# Patient Record
Sex: Female | Born: 1935 | Race: White | Hispanic: No | Marital: Married | Smoking: Never smoker
Health system: Southern US, Community
[De-identification: ages and names within clinical notes are randomized; demographics above are authoritative.]

## PROBLEM LIST (undated history)

## (undated) DIAGNOSIS — J449 Chronic obstructive pulmonary disease, unspecified: Secondary | ICD-10-CM

## (undated) DIAGNOSIS — I639 Cerebral infarction, unspecified: Secondary | ICD-10-CM

## (undated) HISTORY — PX: PELVIC FRACTURE SURGERY: SHX119

---

## 2016-04-09 ENCOUNTER — Inpatient Hospital Stay (HOSPITAL_COMMUNITY): Payer: Medicare Other | Admitting: Anesthesiology

## 2016-04-09 ENCOUNTER — Inpatient Hospital Stay (HOSPITAL_COMMUNITY)
Admission: EM | Admit: 2016-04-09 | Discharge: 2016-04-14 | DRG: 024 | Disposition: A | Discharge status: Rehabilitation Facility | Payer: Medicare Other | Attending: Neurology | Admitting: Neurology

## 2016-04-09 ENCOUNTER — Inpatient Hospital Stay (HOSPITAL_COMMUNITY): Payer: Medicare Other

## 2016-04-09 ENCOUNTER — Encounter (HOSPITAL_COMMUNITY): Payer: Self-pay | Admitting: Emergency Medicine

## 2016-04-09 ENCOUNTER — Encounter (HOSPITAL_COMMUNITY)
Admission: EM | Disposition: A | Discharge status: Rehabilitation Facility | Payer: Self-pay | Source: Home / Self Care | Attending: Neurology

## 2016-04-09 ENCOUNTER — Emergency Department (HOSPITAL_COMMUNITY): Payer: Medicare Other

## 2016-04-09 DIAGNOSIS — E871 Hypo-osmolality and hyponatremia: Secondary | ICD-10-CM | POA: Diagnosis present

## 2016-04-09 DIAGNOSIS — Z79899 Other long term (current) drug therapy: Secondary | ICD-10-CM

## 2016-04-09 DIAGNOSIS — R531 Weakness: Secondary | ICD-10-CM | POA: Diagnosis present

## 2016-04-09 DIAGNOSIS — I519 Heart disease, unspecified: Secondary | ICD-10-CM | POA: Diagnosis not present

## 2016-04-09 DIAGNOSIS — T82898A Other specified complication of vascular prosthetic devices, implants and grafts, initial encounter: Secondary | ICD-10-CM

## 2016-04-09 DIAGNOSIS — I10 Essential (primary) hypertension: Secondary | ICD-10-CM | POA: Diagnosis present

## 2016-04-09 DIAGNOSIS — I69319 Unspecified symptoms and signs involving cognitive functions following cerebral infarction: Secondary | ICD-10-CM | POA: Diagnosis not present

## 2016-04-09 DIAGNOSIS — I63311 Cerebral infarction due to thrombosis of right middle cerebral artery: Secondary | ICD-10-CM | POA: Diagnosis not present

## 2016-04-09 DIAGNOSIS — Q251 Coarctation of aorta: Secondary | ICD-10-CM

## 2016-04-09 DIAGNOSIS — I63132 Cerebral infarction due to embolism of left carotid artery: Secondary | ICD-10-CM | POA: Diagnosis not present

## 2016-04-09 DIAGNOSIS — Z7951 Long term (current) use of inhaled steroids: Secondary | ICD-10-CM

## 2016-04-09 DIAGNOSIS — R4189 Other symptoms and signs involving cognitive functions and awareness: Secondary | ICD-10-CM | POA: Diagnosis present

## 2016-04-09 DIAGNOSIS — R4701 Aphasia: Secondary | ICD-10-CM | POA: Diagnosis present

## 2016-04-09 DIAGNOSIS — J690 Pneumonitis due to inhalation of food and vomit: Secondary | ICD-10-CM | POA: Diagnosis not present

## 2016-04-09 DIAGNOSIS — G8191 Hemiplegia, unspecified affecting right dominant side: Secondary | ICD-10-CM | POA: Diagnosis present

## 2016-04-09 DIAGNOSIS — I6522 Occlusion and stenosis of left carotid artery: Secondary | ICD-10-CM | POA: Diagnosis present

## 2016-04-09 DIAGNOSIS — Z9981 Dependence on supplemental oxygen: Secondary | ICD-10-CM

## 2016-04-09 DIAGNOSIS — R1310 Dysphagia, unspecified: Secondary | ICD-10-CM | POA: Diagnosis present

## 2016-04-09 DIAGNOSIS — J449 Chronic obstructive pulmonary disease, unspecified: Secondary | ICD-10-CM | POA: Diagnosis present

## 2016-04-09 DIAGNOSIS — I63413 Cerebral infarction due to embolism of bilateral middle cerebral arteries: Principal | ICD-10-CM | POA: Diagnosis present

## 2016-04-09 DIAGNOSIS — R1312 Dysphagia, oropharyngeal phase: Secondary | ICD-10-CM | POA: Diagnosis not present

## 2016-04-09 DIAGNOSIS — I63312 Cerebral infarction due to thrombosis of left middle cerebral artery: Secondary | ICD-10-CM

## 2016-04-09 DIAGNOSIS — Z01818 Encounter for other preprocedural examination: Secondary | ICD-10-CM

## 2016-04-09 DIAGNOSIS — E785 Hyperlipidemia, unspecified: Secondary | ICD-10-CM | POA: Diagnosis present

## 2016-04-09 DIAGNOSIS — R7303 Prediabetes: Secondary | ICD-10-CM | POA: Diagnosis present

## 2016-04-09 DIAGNOSIS — I6603 Occlusion and stenosis of bilateral middle cerebral arteries: Secondary | ICD-10-CM | POA: Diagnosis not present

## 2016-04-09 DIAGNOSIS — E876 Hypokalemia: Secondary | ICD-10-CM | POA: Diagnosis not present

## 2016-04-09 DIAGNOSIS — R29717 NIHSS score 17: Secondary | ICD-10-CM | POA: Diagnosis present

## 2016-04-09 DIAGNOSIS — H5347 Heteronymous bilateral field defects: Secondary | ICD-10-CM | POA: Diagnosis present

## 2016-04-09 DIAGNOSIS — D62 Acute posthemorrhagic anemia: Secondary | ICD-10-CM | POA: Diagnosis not present

## 2016-04-09 DIAGNOSIS — J410 Simple chronic bronchitis: Secondary | ICD-10-CM | POA: Diagnosis not present

## 2016-04-09 DIAGNOSIS — D72829 Elevated white blood cell count, unspecified: Secondary | ICD-10-CM | POA: Diagnosis present

## 2016-04-09 DIAGNOSIS — I639 Cerebral infarction, unspecified: Secondary | ICD-10-CM | POA: Diagnosis not present

## 2016-04-09 DIAGNOSIS — I5189 Other ill-defined heart diseases: Secondary | ICD-10-CM

## 2016-04-09 DIAGNOSIS — J9690 Respiratory failure, unspecified, unspecified whether with hypoxia or hypercapnia: Secondary | ICD-10-CM

## 2016-04-09 DIAGNOSIS — I69351 Hemiplegia and hemiparesis following cerebral infarction affecting right dominant side: Secondary | ICD-10-CM | POA: Diagnosis present

## 2016-04-09 DIAGNOSIS — Z4659 Encounter for fitting and adjustment of other gastrointestinal appliance and device: Secondary | ICD-10-CM

## 2016-04-09 DIAGNOSIS — I69391 Dysphagia following cerebral infarction: Secondary | ICD-10-CM | POA: Diagnosis not present

## 2016-04-09 HISTORY — DX: Chronic obstructive pulmonary disease, unspecified: J44.9

## 2016-04-09 HISTORY — PX: IR GENERIC HISTORICAL: IMG1180011

## 2016-04-09 HISTORY — PX: RADIOLOGY WITH ANESTHESIA: SHX6223

## 2016-04-09 LAB — I-STAT CHEM 8, ED
BUN: 31 mg/dL — ABNORMAL HIGH (ref 6–20)
CALCIUM ION: 1.03 mmol/L — AB (ref 1.15–1.40)
CHLORIDE: 99 mmol/L — AB (ref 101–111)
Creatinine, Ser: 0.9 mg/dL (ref 0.44–1.00)
Glucose, Bld: 233 mg/dL — ABNORMAL HIGH (ref 65–99)
HEMATOCRIT: 46 % (ref 36.0–46.0)
Hemoglobin: 15.6 g/dL — ABNORMAL HIGH (ref 12.0–15.0)
Potassium: 4.1 mmol/L (ref 3.5–5.1)
SODIUM: 134 mmol/L — AB (ref 135–145)
TCO2: 25 mmol/L (ref 0–100)

## 2016-04-09 LAB — CBG MONITORING, ED: Glucose-Capillary: 224 mg/dL — ABNORMAL HIGH (ref 65–99)

## 2016-04-09 LAB — DIFFERENTIAL
Basophils Absolute: 0 10*3/uL (ref 0.0–0.1)
Basophils Relative: 0 %
Eosinophils Absolute: 0 10*3/uL (ref 0.0–0.7)
Eosinophils Relative: 0 %
Lymphocytes Relative: 6 %
Lymphs Abs: 0.8 10*3/uL (ref 0.7–4.0)
MONO ABS: 0.3 10*3/uL (ref 0.1–1.0)
Monocytes Relative: 3 %
NEUTROS PCT: 91 %
Neutro Abs: 11.7 10*3/uL — ABNORMAL HIGH (ref 1.7–7.7)

## 2016-04-09 LAB — COMPREHENSIVE METABOLIC PANEL
ALBUMIN: 3.2 g/dL — AB (ref 3.5–5.0)
ALK PHOS: 61 U/L (ref 38–126)
ALT: 17 U/L (ref 14–54)
AST: 22 U/L (ref 15–41)
Anion gap: 9 (ref 5–15)
BILIRUBIN TOTAL: 0.7 mg/dL (ref 0.3–1.2)
BUN: 23 mg/dL — AB (ref 6–20)
CO2: 23 mmol/L (ref 22–32)
CREATININE: 0.91 mg/dL (ref 0.44–1.00)
Calcium: 8.5 mg/dL — ABNORMAL LOW (ref 8.9–10.3)
Chloride: 99 mmol/L — ABNORMAL LOW (ref 101–111)
GFR calc Af Amer: >60 mL/min (ref >60)
GFR, EST NON AFRICAN AMERICAN: 56 mL/min — AB (ref >60)
GLUCOSE: 231 mg/dL — AB (ref 65–99)
Potassium: 3.8 mmol/L (ref 3.5–5.1)
Sodium: 131 mmol/L — ABNORMAL LOW (ref 135–145)
TOTAL PROTEIN: 7.5 g/dL (ref 6.5–8.1)

## 2016-04-09 LAB — GLUCOSE, CAPILLARY: Glucose-Capillary: 126 mg/dL — ABNORMAL HIGH (ref 65–99)

## 2016-04-09 LAB — CBC
HCT: 44.1 % (ref 36.0–46.0)
Hemoglobin: 14.5 g/dL (ref 12.0–15.0)
MCH: 27.6 pg (ref 26.0–34.0)
MCHC: 32.9 g/dL (ref 30.0–36.0)
MCV: 83.8 fL (ref 78.0–100.0)
PLATELETS: 168 10*3/uL (ref 150–400)
RBC: 5.26 MIL/uL — ABNORMAL HIGH (ref 3.87–5.11)
RDW: 13.9 % (ref 11.5–15.5)
WBC: 12.9 10*3/uL — AB (ref 4.0–10.5)

## 2016-04-09 LAB — I-STAT TROPONIN, ED: TROPONIN I, POC: 0 ng/mL (ref 0.00–0.08)

## 2016-04-09 LAB — ETHANOL

## 2016-04-09 LAB — PROTIME-INR
INR: 1.14
PROTHROMBIN TIME: 14.6 s (ref 11.4–15.2)

## 2016-04-09 LAB — APTT: aPTT: 25 seconds (ref 24–36)

## 2016-04-09 SURGERY — RADIOLOGY WITH ANESTHESIA
Anesthesia: General

## 2016-04-09 MED ORDER — FENTANYL CITRATE (PF) 100 MCG/2ML IJ SOLN
INTRAMUSCULAR | Status: DC | PRN
  Administered 2016-04-09 (×2): 100 ug via INTRAVENOUS

## 2016-04-09 MED ORDER — ACETAMINOPHEN 650 MG RE SUPP: 650.0000 mg | RECTAL | Status: DC | PRN

## 2016-04-09 MED ORDER — ALTEPLASE (STROKE) FULL DOSE INFUSION
0.9000 mg/kg | Freq: Once | INTRAVENOUS | Status: AC
  Administered 2016-04-09: 70 mg via INTRAVENOUS
  Filled 2016-04-09: qty 100

## 2016-04-09 MED ORDER — IOPAMIDOL (ISOVUE-370) INJECTION 76%
INTRAVENOUS | Status: AC
  Administered 2016-04-09: 50 mL
  Filled 2016-04-09: qty 100

## 2016-04-09 MED ORDER — HYDROMORPHONE HCL 1 MG/ML IJ SOLN: 0.2500 mg | INTRAMUSCULAR | Status: DC | PRN

## 2016-04-09 MED ORDER — IOPAMIDOL (ISOVUE-300) INJECTION 61%
INTRAVENOUS | Status: AC
  Administered 2016-04-09: 20 mL
  Filled 2016-04-09: qty 150

## 2016-04-09 MED ORDER — IOPAMIDOL (ISOVUE-300) INJECTION 61%
INTRAVENOUS | Status: AC
  Administered 2016-04-09: 80 mL
  Filled 2016-04-09: qty 150

## 2016-04-09 MED ORDER — SODIUM CHLORIDE 0.9 % IV SOLN
INTRAVENOUS | Status: DC
  Administered 2016-04-10: via INTRAVENOUS

## 2016-04-09 MED ORDER — LIDOCAINE HCL 1 % IJ SOLN
INTRAMUSCULAR | Status: AC
  Filled 2016-04-09: qty 20

## 2016-04-09 MED ORDER — PROPOFOL 1000 MG/100ML IV EMUL
0.0000 ug/kg/min | INTRAVENOUS | Status: DC
  Administered 2016-04-10: 10 ug/kg/min via INTRAVENOUS
  Filled 2016-04-09 (×2): qty 100

## 2016-04-09 MED ORDER — ACETAMINOPHEN 325 MG PO TABS: 650.0000 mg | ORAL_TABLET | ORAL | Status: DC | PRN

## 2016-04-09 MED ORDER — FAMOTIDINE IN NACL 20-0.9 MG/50ML-% IV SOLN: 20.0000 mg | Freq: Two times a day (BID) | INTRAVENOUS | Status: DC

## 2016-04-09 MED ORDER — FENTANYL CITRATE (PF) 100 MCG/2ML IJ SOLN: 50.0000 ug | INTRAMUSCULAR | Status: DC | PRN

## 2016-04-09 MED ORDER — NICARDIPINE HCL IN NACL 20-0.86 MG/200ML-% IV SOLN
5.0000 mg/h | INTRAVENOUS | Status: DC
  Administered 2016-04-10: 15 mg/h via INTRAVENOUS
  Administered 2016-04-10: 5 mg/h via INTRAVENOUS
  Administered 2016-04-10: 15 mg/h via INTRAVENOUS
  Filled 2016-04-09: qty 200
  Filled 2016-04-09: qty 400

## 2016-04-09 MED ORDER — MIDAZOLAM HCL 2 MG/2ML IJ SOLN
INTRAMUSCULAR | Status: DC | PRN
  Administered 2016-04-09: 2 mg via INTRAVENOUS

## 2016-04-09 MED ORDER — SUCCINYLCHOLINE CHLORIDE 20 MG/ML IJ SOLN
INTRAMUSCULAR | Status: DC | PRN
  Administered 2016-04-09: 120 mg via INTRAVENOUS

## 2016-04-09 MED ORDER — ONDANSETRON HCL 4 MG/2ML IJ SOLN: 4.0000 mg | Freq: Four times a day (QID) | INTRAMUSCULAR | Status: DC | PRN

## 2016-04-09 MED ORDER — EPTIFIBATIDE 20 MG/10ML IV SOLN
INTRAVENOUS | Status: AC
  Administered 2016-04-09 (×2): 1.8 mg via INTRA_ARTERIAL
  Filled 2016-04-09: qty 10

## 2016-04-09 MED ORDER — SUFENTANIL CITRATE 50 MCG/ML IV SOLN
INTRAVENOUS | Status: AC
  Filled 2016-04-09: qty 1

## 2016-04-09 MED ORDER — ACETAMINOPHEN 650 MG RE SUPP: 650.0000 mg | Freq: Four times a day (QID) | RECTAL | Status: DC | PRN

## 2016-04-09 MED ORDER — CHLORHEXIDINE GLUCONATE 0.12% ORAL RINSE (MEDLINE KIT)
15.0000 mL | Freq: Two times a day (BID) | OROMUCOSAL | Status: DC
  Administered 2016-04-10 (×2): 15 mL via OROMUCOSAL

## 2016-04-09 MED ORDER — MEPERIDINE HCL 25 MG/ML IJ SOLN: 6.2500 mg | INTRAMUSCULAR | Status: DC | PRN

## 2016-04-09 MED ORDER — CEFAZOLIN SODIUM-DEXTROSE 2-4 GM/100ML-% IV SOLN
INTRAVENOUS | Status: AC
  Filled 2016-04-09: qty 100

## 2016-04-09 MED ORDER — CEFAZOLIN SODIUM-DEXTROSE 2-4 GM/100ML-% IV SOLN
2.0000 g | Freq: Once | INTRAVENOUS | Status: AC
  Administered 2016-04-09: 2 g via INTRAVENOUS

## 2016-04-09 MED ORDER — PANTOPRAZOLE SODIUM 40 MG IV SOLR
40.0000 mg | Freq: Every day | INTRAVENOUS | Status: DC
  Administered 2016-04-10 – 2016-04-13 (×5): 40 mg via INTRAVENOUS
  Filled 2016-04-09 (×5): qty 40

## 2016-04-09 MED ORDER — NITROGLYCERIN 1 MG/10 ML FOR IR/CATH LAB
INTRA_ARTERIAL | Status: AC
  Filled 2016-04-09: qty 10

## 2016-04-09 MED ORDER — ROCURONIUM BROMIDE 100 MG/10ML IV SOLN
INTRAVENOUS | Status: DC | PRN
  Administered 2016-04-09 (×2): 50 mg via INTRAVENOUS

## 2016-04-09 MED ORDER — ONDANSETRON HCL 4 MG/2ML IJ SOLN: 4.0000 mg | Freq: Once | INTRAMUSCULAR | Status: DC | PRN

## 2016-04-09 MED ORDER — ACETAMINOPHEN 500 MG PO TABS: 1000.0000 mg | ORAL_TABLET | Freq: Four times a day (QID) | ORAL | Status: DC | PRN

## 2016-04-09 MED ORDER — PHENYLEPHRINE HCL 10 MG/ML IJ SOLN
INTRAVENOUS | Status: DC | PRN
  Administered 2016-04-09: 15 ug/min via INTRAVENOUS

## 2016-04-09 MED ORDER — LIDOCAINE HCL (CARDIAC) 20 MG/ML IV SOLN
INTRAVENOUS | Status: DC | PRN
  Administered 2016-04-09: 100 mg via INTRATRACHEAL

## 2016-04-09 MED ORDER — MIDAZOLAM HCL 2 MG/2ML IJ SOLN
INTRAMUSCULAR | Status: AC
  Filled 2016-04-09: qty 2

## 2016-04-09 MED ORDER — SUFENTANIL CITRATE 50 MCG/ML IV SOLN
INTRAVENOUS | Status: DC | PRN
  Administered 2016-04-09: 5 ug via INTRAVENOUS
  Administered 2016-04-09: 10 ug via INTRAVENOUS
  Administered 2016-04-09: 25 ug via INTRAVENOUS
  Administered 2016-04-09: 10 ug via INTRAVENOUS

## 2016-04-09 MED ORDER — STROKE: EARLY STAGES OF RECOVERY BOOK
Freq: Once | Status: AC
  Administered 2016-04-09
  Filled 2016-04-09: qty 1

## 2016-04-09 MED ORDER — SODIUM CHLORIDE 0.9 % IV SOLN
INTRAVENOUS | Status: DC
  Administered 2016-04-11 – 2016-04-12 (×3): via INTRAVENOUS

## 2016-04-09 MED ORDER — LACTATED RINGERS IV SOLN
INTRAVENOUS | Status: DC | PRN
  Administered 2016-04-09 (×2): via INTRAVENOUS

## 2016-04-09 MED ORDER — NICARDIPINE HCL IN NACL 20-0.86 MG/200ML-% IV SOLN
INTRAVENOUS | Status: DC | PRN
  Administered 2016-04-09: .2 mg/h via INTRAVENOUS

## 2016-04-09 MED ORDER — FENTANYL CITRATE (PF) 100 MCG/2ML IJ SOLN
50.0000 ug | INTRAMUSCULAR | Status: DC | PRN
  Filled 2016-04-09: qty 2

## 2016-04-09 MED ORDER — PROPOFOL 10 MG/ML IV BOLUS
INTRAVENOUS | Status: DC | PRN
  Administered 2016-04-09 (×2): 50 mg via INTRAVENOUS
  Administered 2016-04-09: 100 mg via INTRAVENOUS

## 2016-04-09 MED ORDER — ORAL CARE MOUTH RINSE
15.0000 mL | Freq: Four times a day (QID) | OROMUCOSAL | Status: DC
  Administered 2016-04-10 (×3): 15 mL via OROMUCOSAL

## 2016-04-09 NOTE — Procedures (Signed)
Intubation Procedure Note Catherine ClossRegina Day [id number] 04/12/1928  Procedure: Intubation Indications: Airway protection and maintenance  Procedure Details Consent: Unable to obtain consent because of emergent medical necessity. Time Out: Verified patient identification, verified procedure, site/side was marked, verified correct patient position, special equipment/implants available, medications/allergies/relevent history reviewed, required imaging and test results available.  Performed  Maximum sterile technique was used including gloves.  MAC    Evaluation Hemodynamic Status: BP stable throughout; O2 sats: stable throughout Patient's Current Condition: stable Complications: No apparent complications Patient did tolerate procedure well. Chest X-ray ordered to verify placement.  CXR: pending.   Catherine ChestnutReid, Catherine Day 04/09/2016

## 2016-04-09 NOTE — Anesthesia Preprocedure Evaluation (Addendum)
Anesthesia Evaluation  Patient identified by MRN, date of birth, ID band Patient awake    Reviewed: Allergy & Precautions, NPO status , Patient's Chart, lab work & pertinent test results  Airway Mallampati: I  TM Distance: >3 FB Neck ROM: Full    Dental   Pulmonary COPD,  oxygen dependent,    Pulmonary exam normal        Cardiovascular Normal cardiovascular exam     Neuro/Psych CVA    GI/Hepatic   Endo/Other    Renal/GU      Musculoskeletal   Abdominal   Peds  Hematology   Anesthesia Other Findings   Reproductive/Obstetrics                            Anesthesia Physical Anesthesia Plan  ASA: III and emergent  Anesthesia Plan: General   Post-op Pain Management:    Induction: Intravenous, Rapid sequence and Cricoid pressure planned  Airway Management Planned: Oral ETT  Additional Equipment: Arterial line and CVP  Intra-op Plan:   Post-operative Plan: Post-operative intubation/ventilation  Informed Consent: I have reviewed the patients History and Physical, chart, labs and discussed the procedure including the risks, benefits and alternatives for the proposed anesthesia with the patient or authorized representative who has indicated his/her understanding and acceptance.     Plan Discussed with: CRNA and Surgeon  Anesthesia Plan Comments:         Anesthesia Quick Evaluation

## 2016-04-09 NOTE — Consult Note (Signed)
Admission H&P    Chief Complaint: right sided weakness HPI: Catherine Day is an 82 y.o. female who was brought in by family because of sudden onset of right sided weakness and inability to speak.  The hx is obtained from the daughter and chart review.  The daughter noticed the sx suddenly and called EMS.  The patient has no prior h/o stroke.  She was not routinely taking an ASA daily.  She is generally functional except for impairment from her chronic lung disease.  History of Stroke: No.  Date last known well:Date: 04/09/2016 Time last known well: Time: 17:30  tPA Given: Yes mRankin:  No past medical history on file.  No past surgical history on file.  No family history on file. Social History:  has no tobacco, alcohol, and drug history on file.  Allergies: No Known Allergies   (Not in a hospital admission)  ROS: Unable to obtain due to aphasia  Physical Examination: Blood pressure 183/66, pulse 77, resp. rate 23, weight 77.4 kg (170 lb 10.2 oz), SpO2 98 %.  Neurologic Examination: MS - alert, non-conversant, speech is garbled, not following commands CN - has left gaze preference and right hemianopsia Motor - has dense right hemiplegia; normal on left Sensory - could not assess coor - no dysmetria on the left side DTR - decreased but symmetric Station/gait - not tested NIHSS = 17  Results for orders placed or performed during the hospital encounter of 04/09/16 (from the past 48 hour(s))  Ethanol     Status: None   Collection Time: 04/09/16  6:25 PM  Result Value Ref Range   Alcohol, Ethyl (B) <5 <5 mg/dL    Comment:        LOWEST DETECTABLE LIMIT FOR SERUM ALCOHOL IS 5 mg/dL FOR MEDICAL PURPOSES ONLY   CBG monitoring, ED     Status: Abnormal   Collection Time: 04/09/16  6:25 PM  Result Value Ref Range   Glucose-Capillary 224 (H) 65 - 99 mg/dL   Comment 1 Notify RN    Comment 2 Document in Chart   Protime-INR     Status: None   Collection Time: 04/09/16  6:27  PM  Result Value Ref Range   Prothrombin Time 14.6 11.4 - 15.2 seconds   INR 1.14   APTT     Status: None   Collection Time: 04/09/16  6:27 PM  Result Value Ref Range   aPTT 25 24 - 36 seconds  CBC     Status: Abnormal   Collection Time: 04/09/16  6:27 PM  Result Value Ref Range   WBC 12.9 (H) 4.0 - 10.5 K/uL   RBC 5.26 (H) 3.87 - 5.11 MIL/uL   Hemoglobin 14.5 12.0 - 15.0 g/dL   HCT 44.1 36.0 - 46.0 %   MCV 83.8 78.0 - 100.0 fL   MCH 27.6 26.0 - 34.0 pg   MCHC 32.9 30.0 - 36.0 g/dL   RDW 13.9 11.5 - 15.5 %   Platelets 168 150 - 400 K/uL  Differential     Status: Abnormal   Collection Time: 04/09/16  6:27 PM  Result Value Ref Range   Neutrophils Relative % 91 %   Neutro Abs 11.7 (H) 1.7 - 7.7 K/uL   Lymphocytes Relative 6 %   Lymphs Abs 0.8 0.7 - 4.0 K/uL   Monocytes Relative 3 %   Monocytes Absolute 0.3 0.1 - 1.0 K/uL   Eosinophils Relative 0 %   Eosinophils Absolute 0.0 0.0 - 0.7 K/uL  Basophils Relative 0 %   Basophils Absolute 0.0 0.0 - 0.1 K/uL  Comprehensive metabolic panel     Status: Abnormal   Collection Time: 04/09/16  6:27 PM  Result Value Ref Range   Sodium 131 (L) 135 - 145 mmol/L   Potassium 3.8 3.5 - 5.1 mmol/L   Chloride 99 (L) 101 - 111 mmol/L   CO2 23 22 - 32 mmol/L   Glucose, Bld 231 (H) 65 - 99 mg/dL   BUN 23 (H) 6 - 20 mg/dL   Creatinine, Ser 0.91 0.44 - 1.00 mg/dL   Calcium 8.5 (L) 8.9 - 10.3 mg/dL   Total Protein 7.5 6.5 - 8.1 g/dL   Albumin 3.2 (L) 3.5 - 5.0 g/dL   AST 22 15 - 41 U/L   ALT 17 14 - 54 U/L   Alkaline Phosphatase 61 38 - 126 U/L   Total Bilirubin 0.7 0.3 - 1.2 mg/dL   GFR calc non Af Amer 56 (L) >60 mL/min   GFR calc Af Amer >60 >60 mL/min    Comment: (NOTE) The eGFR has been calculated using the CKD EPI equation. This calculation has not been validated in all clinical situations. eGFR's persistently <60 mL/min signify possible Chronic Kidney Disease.    Anion gap 9 5 - 15  I-stat troponin, ED (not at [facility], [facility])      Status: None   Collection Time: 04/09/16  6:31 PM  Result Value Ref Range   Troponin i, poc 0.00 0.00 - 0.08 ng/mL   Comment 3            Comment: Due to the release kinetics of cTnI, a negative result within the first hours of the onset of symptoms does not rule out myocardial infarction with certainty. If myocardial infarction is still suspected, repeat the test at appropriate intervals.   I-Stat Chem 8, ED  (not at [facility], [facility])     Status: Abnormal   Collection Time: 04/09/16  6:34 PM  Result Value Ref Range   Sodium 134 (L) 135 - 145 mmol/L   Potassium 4.1 3.5 - 5.1 mmol/L   Chloride 99 (L) 101 - 111 mmol/L   BUN 31 (H) 6 - 20 mg/dL   Creatinine, Ser 0.90 0.44 - 1.00 mg/dL   Glucose, Bld 233 (H) 65 - 99 mg/dL   Calcium, Ion 1.03 (L) 1.15 - 1.40 mmol/L   TCO2 25 0 - 100 mmol/L   Hemoglobin 15.6 (H) 12.0 - 15.0 g/dL   HCT 46.0 36.0 - 46.0 %   Ct Head Code Stroke Wo Contrast`  Result Date: 04/09/2016 CLINICAL DATA:  Code stroke for right-sided weakness and loss of speech. EXAM: CT HEAD WITHOUT CONTRAST TECHNIQUE: Contiguous axial images were obtained from the base of the skull through the vertex without intravenous contrast. COMPARISON:  None. FINDINGS: Brain: No evidence of acute infarction, hemorrhage, hydrocephalus, extra-axial collection or mass lesion/mass effect. Vascular: Atherosclerotic calcification.  No hyperdense vessel. Skull: Negative Sinuses/Orbits: Mild chronic sinusitis and left mastoiditis. Other: Results were called by telephone at the time of interpretation on 04/09/2016 at 6:41 pm to Dr. Lawana Pai, who verbally acknowledged these results. ASPECTS [facility] Stroke Program Early CT Score) - Ganglionic level infarction (caudate, lentiform nuclei, internal capsule, insula, M1-M3 cortex): 7 - Supraganglionic infarction (M4-M6 cortex): 3 Total score (0-10 with 10 being normal): 10 IMPRESSION: No acute finding. ASPECTS is 10. Electronically Signed   By: Monte Fantasia M.D.   On:  04/09/2016 18:43   Assessment: 82 y.o.  female probable left MCA stroke likely large-vessel involvement.  The patient was a candidate for tPA which was given.  She was also thought to be a thrombectomy candidate and taken for further evaluation.  Stroke Risk Factors - age  Plan: 1. HgbA1c, fasting lipid panel; tPA given 2. MRI, MRA  of the brain without contrast 3. PT consult, OT consult, Speech consult 4. Echocardiogram 5. Prophylactic therapy-Antiplatelet med: Aspirin 6. Risk factor modification 7. Telemetry monitoring in ICU 8. Consideration for possible thrombectomy  Stroke education, treatment, and current plan discussed with patient/significant other: Yes.    Doren Custard, MD Neurology 04/09/2016, 8:17 PM

## 2016-04-09 NOTE — Anesthesia Procedure Notes (Signed)
Procedure Name: Intubation Date/Time: 04/09/2016 8:17 PM Performed by: Staley Budzinski S Pre-anesthesia Checklist: Patient identified, Emergency Drugs available, Suction available, Patient being monitored and Timeout performed Patient Re-evaluated:Patient Re-evaluated prior to inductionOxygen Delivery Method: Circle system utilized Preoxygenation: Pre-oxygenation with 100% oxygen Intubation Type: IV induction, Rapid sequence and Cricoid Pressure applied Laryngoscope Size: Mac and 3 Grade View: Grade I Tube type: Subglottic suction tube Tube size: 7.0 mm Number of attempts: 1 Airway Equipment and Method: Stylet Placement Confirmation: ETT inserted through vocal cords under direct vision,  positive ETCO2 and breath sounds checked- equal and bilateral Secured at: 22 cm Tube secured with: Tape Dental Injury: Teeth and Oropharynx as per pre-operative assessment

## 2016-04-09 NOTE — ED Provider Notes (Signed)
Procedure note: Ultrasound Guided Peripheral IV Ultrasound guided peripheral 1.88 inch angiocath IV placement performed by me. Indications: Nursing unable to place IV. Details: The antecubital fossa and upper arm were evaluated with a multifrequency linear probe. Patent brachial veins were noted. 1 attempt was made to cannulate a vein under realtime US guidance with successful cannulation of the vein and catheter placement. There is return of non-pulsatile dark red blood. The patient tolerated the procedure well without complications. Images archived electronically.  CPT codes: [id number] and 36410    Cherlynn PerchesEric Hanalei Glace, MD 04/09/16 1914    Lyndal Pulleyaniel Knott, MD 04/10/16 [phone]

## 2016-04-09 NOTE — Consult Note (Signed)
PULMONARY / CRITICAL CARE MEDICINE   Name: Catherine ClossRegina Day MRN: [id number] DOB: 03/09/1928    ADMISSION DATE:  04/09/2016 CONSULTATION DATE:  04/09/2016  REFERRING MD:  Dr. Amada JupiterKirkpatrick  CHIEF COMPLAINT:  CVA  HISTORY OF PRESENT ILLNESS:   82 year old female with PMH COPD, HTN,  [facility] sudden onset R sided-weakness and aphasia. EMS was immediately called. Code stroke initiated in ED and CT head without acute findings. tPa was administered in ED and he was taken to IR for neuro intervention. Where she underwent revascularization of occluded Lt MCA, ACA and ICA terminus. She was electively intubated for the procedure and sent to ICU for recovery and frequent assessment. PCCM to consult for vent management.   PAST MEDICAL HISTORY :  She  has a past medical history of COPD (chronic obstructive pulmonary disease) (HCC).  PAST SURGICAL HISTORY: She  has a past surgical history that includes Pelvic fracture surgery.  No Known Allergies  No current facility-administered medications on file prior to encounter.    No current outpatient prescriptions on file prior to encounter.    FAMILY HISTORY:  Her has no family status information on file.    SOCIAL HISTORY: She  reports that she has never smoked. She has never used smokeless tobacco. She reports that she does not drink alcohol or use drugs.  REVIEW OF SYSTEMS:   Unable  SUBJECTIVE:  Unable  VITAL SIGNS: BP 183/66   Pulse 77   Resp 23   Wt 77.4 kg (170 lb 10.2 oz)   SpO2 100%   HEMODYNAMICS:    VENTILATOR SETTINGS: Vent Mode: PRVC FiO2 (%):  [40 %] 40 % Set Rate:  [15 bmp] 15 bmp Vt Set:  [500 mL] 500 mL PEEP:  [5 cmH20] 5 cmH20 Plateau Pressure:  [14 cmH20] 14 cmH20  INTAKE / OUTPUT: No intake/output data recorded.  PHYSICAL EXAMINATION: General:  Elderly female in NAD on vent Neuro:  Sedated on vent. decreased R HEENT:  [place]/AT, PERRL Cardiovascular:  RRR, no MRG Lungs:  Good ae. rhochi at  bases. (-) wheezing.  Abdomen:  Soft, non-distended Musculoskeletal:   Skin:  (-) edema. Warm, dry  LABS:  BMET  Recent Labs Lab 04/09/16 1827 04/09/16 1834  NA 131* 134*  K 3.8 4.1  CL 99* 99*  CO2 23  --   BUN 23* 31*  CREATININE 0.91 0.90  GLUCOSE 231* 233*    Electrolytes  Recent Labs Lab 04/09/16 1827  CALCIUM 8.5*    CBC  Recent Labs Lab 04/09/16 1827 04/09/16 1834  WBC 12.9*  --   HGB 14.5 15.6*  HCT 44.1 46.0  PLT 168  --     Coag's  Recent Labs Lab 04/09/16 1827  APTT 25  INR 1.14    Sepsis Markers No results for input(s): LATICACIDVEN, PROCALCITON, O2SATVEN in the last 168 hours.  ABG No results for input(s): PHART, PCO2ART, PO2ART in the last 168 hours.  Liver Enzymes  Recent Labs Lab 04/09/16 1827  AST 22  ALT 17  ALKPHOS 61  BILITOT 0.7  ALBUMIN 3.2*    Cardiac Enzymes No results for input(s): TROPONINI, PROBNP in the last 168 hours.  Glucose  Recent Labs Lab 04/09/16 1825  GLUCAP 224*    Imaging Ct Head Wo Contrast  Result Date: 04/09/2016 CLINICAL DATA:  82 y/o F; code stroke post transarterial intervention. EXAM: CT HEAD WITHOUT CONTRAST TECHNIQUE: Contiguous axial images were obtained from the base of the skull  through the vertex without intravenous contrast. COMPARISON:  04/09/2016 CT head. FINDINGS: Brain: No large territory acute infarct or intracranial hemorrhage identified. Patchy foci of hypoattenuation in periventricular and subcortical white matter may represent areas of infarction or chronic microvascular ischemic changes and are stable. Mild parenchymal volume loss. No focal mass effect. No extra-axial collection. No effacement of basilar cisterns. Vascular: Persistent opacification from previous contrast administration. Calcific atherosclerosis of carotid siphons. Skull: Normal. Negative for fracture or focal lesion. Sinuses/Orbits: Partial opacification of ethmoid and sphenoid sinuses with aerosolized  secretions and partial opacification of mastoid air cells probably related to intubation. Orbits are unremarkable. Other: None. IMPRESSION: No large territory acute infarct or intracranial hemorrhage identified. Patchy foci of hypoattenuation in periventricular and subcortical white matter may represent areas of infarction or chronic microvascular ischemic changes and are stable. If clinically indicated MRI is more sensitive for acute ischemia. Electronically Signed   By: Mitzi HansenLance  Furusawa-Stratton M.D.   On: 04/09/2016 22:51   Ct Head Code Stroke Wo Contrast`  Result Date: 04/09/2016 CLINICAL DATA:  Code stroke for right-sided weakness and loss of speech. EXAM: CT HEAD WITHOUT CONTRAST TECHNIQUE: Contiguous axial images were obtained from the base of the skull through the vertex without intravenous contrast. COMPARISON:  None. FINDINGS: Brain: No evidence of acute infarction, hemorrhage, hydrocephalus, extra-axial collection or mass lesion/mass effect. Vascular: Atherosclerotic calcification.  No hyperdense vessel. Skull: Negative Sinuses/Orbits: Mild chronic sinusitis and left mastoiditis. Other: Results were called by telephone at the time of interpretation on 04/09/2016 at 6:41 pm to Dr. Benedict NeedyNath, who verbally acknowledged these results. ASPECTS [facility] Stroke Program Early CT Score) - Ganglionic level infarction (caudate, lentiform nuclei, internal capsule, insula, M1-M3 cortex): 7 - Supraganglionic infarction (M4-M6 cortex): 3 Total score (0-10 with 10 being normal): 10 IMPRESSION: No acute finding. ASPECTS is 10. Electronically Signed   By: Marnee SpringJonathon  Watts M.D.   On: 04/09/2016 18:43     STUDIES:  CT head 11/23 > no acute finding  CULTURES:   ANTIBIOTICS:   SIGNIFICANT EVENTS:   LINES/TUBES: ETT 11/23 > A line 11/23 >  DISCUSSION:   ASSESSMENT / PLAN:  PULMONARY A: VDRF in post-operative setting COPD without acute exacerbation  P:  Full vent support. PST in am. Anticipate can  extubate if she wakes up.  SBT in AM VAP bundle Scheduled nebs  CARDIOVASCULAR A:  HTN  P:  SBP goal per neurology Echo Titrate cardene drip according to goal SBP per neuro   RENAL A:   Hyponatremia  P:   Maintenance IVF   GASTROINTESTINAL A:   No acute issues  P:   NPO Pepcid  HEMATOLOGIC A:   No acute issues  P:    INFECTIOUS A:   Leukocytosis  P:   perioperative ancef Observe for fever trend.   ENDOCRINE A:   No acute issues  P:   NPO CBG and SSI   NEUROLOGIC A:   Acute L MCA CVA, S/P tPA (11/23) S/P revascularization of occluded Lt MCA, ACA and ICA terminus. (11/23)  P:   RASS goal: -1 to -2 Propofol drip  Keep sedated for now.    FAMILY  - Updates: No family seen at bedside.   - Inter-disciplinary family meet or Palliative Care meeting due by: Dec 1   Critical care time with this patient today : 30 minutes.   Lance SellJ. Angelo A. Christene Slatese Dios, MD Pulmonary and Critical Care Medicine [facility] Pager: [phone]  04/09/2016, 10:55 PM

## 2016-04-09 NOTE — ED Notes (Signed)
Pt's CBG 224.  Informed [place], Charity fundraiserN.

## 2016-04-09 NOTE — ED Provider Notes (Signed)
[facility] DEPT Provider Note   CSN: [id number] Arrival date & time: 04/09/16  1823     History   Chief Complaint No chief complaint on file.   HPI Catherine Day is a 82 y.o. female.  The history is provided by a relative.  Cerebrovascular Accident  This is a new problem. The current episode started less than 1 hour ago. The problem occurs constantly. The problem has been rapidly worsening. Pertinent negatives include no chest pain, no abdominal pain and no shortness of breath. Nothing aggravates the symptoms. Nothing relieves the symptoms. She has tried nothing for the symptoms.    No past medical history on file.  There are no active problems to display for this patient.   No past surgical history on file.  OB History    No data available       Home Medications    Prior to Admission medications   Not on File    Family History No family history on file.  Social History Social History  Substance Use Topics  . Smoking status: Not on file  . Smokeless tobacco: Not on file  . Alcohol use Not on file     Allergies   Patient has no allergy information on record.   Review of Systems Review of Systems  Unable to perform ROS: Patient nonverbal  Respiratory: Negative for shortness of breath.   Cardiovascular: Negative for chest pain.  Gastrointestinal: Negative for abdominal pain.     Physical Exam Updated Vital Signs Wt 170 lb 10.2 oz (77.4 kg)   Physical Exam  Constitutional: She appears well-developed and well-nourished. She appears listless. No distress.  HENT:  Head: Normocephalic.  Nose: Nose normal.  Eyes: Conjunctivae are normal.  Neck: Neck supple. No tracheal deviation present.  Cardiovascular: Normal rate and regular rhythm.   Pulmonary/Chest: Effort normal. No respiratory distress.  Abdominal: Soft. She exhibits no distension.  Neurological: She appears listless.  Left gaze preferance, global right sided weakness with facial  asymmetry. Following commands  Skin: Skin is warm and dry.  Psychiatric: She is noncommunicative.     ED Treatments / Results  Labs (all labs ordered are listed, but only abnormal results are displayed) Labs Reviewed  CBG MONITORING, ED - Abnormal; Notable for the following:       Result Value   Glucose-Capillary 224 (*)    All other components within normal limits  ETHANOL  PROTIME-INR  APTT  CBC  DIFFERENTIAL  COMPREHENSIVE METABOLIC PANEL  RAPID URINE DRUG SCREEN, HOSP PERFORMED  URINALYSIS, ROUTINE W REFLEX MICROSCOPIC (NOT AT [facility])  I-STAT CHEM 8, ED  I-STAT TROPOININ, ED    EKG  EKG Interpretation None       Radiology Ct Head Code Stroke Wo Contrast`  Result Date: 04/09/2016 CLINICAL DATA:  Code stroke for right-sided weakness and loss of speech. EXAM: CT HEAD WITHOUT CONTRAST TECHNIQUE: Contiguous axial images were obtained from the base of the skull through the vertex without intravenous contrast. COMPARISON:  None. FINDINGS: Brain: No evidence of acute infarction, hemorrhage, hydrocephalus, extra-axial collection or mass lesion/mass effect. Vascular: Atherosclerotic calcification.  No hyperdense vessel. Skull: Negative Sinuses/Orbits: Mild chronic sinusitis and left mastoiditis. Other: Results were called by telephone at the time of interpretation on 04/09/2016 at 6:41 pm to Dr. Lawana Pai, who verbally acknowledged these results. ASPECTS [facility] Stroke Program Early CT Score) - Ganglionic level infarction (caudate, lentiform nuclei, internal capsule, insula, M1-M3 cortex): 7 - Supraganglionic infarction (M4-M6 cortex): 3 Total score (0-10 with 10  being normal): 10 IMPRESSION: No acute finding. ASPECTS is 10. Electronically Signed   By: Monte Fantasia M.D.   On: 04/09/2016 18:43    Procedures Procedures (including critical care time)  CRITICAL CARE Performed by: Leo Grosser Total critical care time: 30 minutes Critical care time was exclusive of separately  billable procedures and treating other patients. Critical care was necessary to treat or prevent imminent or life-threatening deterioration. Critical care was time spent personally by me on the following activities: development of treatment plan with patient and/or surrogate as well as nursing, discussions with consultants, evaluation of patient's response to treatment, examination of patient, obtaining history from patient or surrogate, ordering and performing treatments and interventions, ordering and review of laboratory studies, ordering and review of radiographic studies, pulse oximetry and re-evaluation of patient's condition.  Medications Ordered in ED Medications  0.9 %  sodium chloride infusion (not administered)  acetaminophen (TYLENOL) tablet 650 mg (not administered)    Or  acetaminophen (TYLENOL) suppository 650 mg (not administered)  pantoprazole (PROTONIX) injection 40 mg (not administered)  lidocaine (XYLOCAINE) 1 % (with pres) injection (not administered)  nitroGLYCERIN 100 MCG/ML intra-arterial injection (not administered)  ondansetron (ZOFRAN) injection 4 mg (not administered)  nicardipine (CARDENE) 62m in 0.86% saline 2068mIV infusion (0.1 mg/ml) (15 mg/hr Intravenous New Bag/Given 04/10/16 0054)  0.9 %  sodium chloride infusion ( Intravenous New Bag/Given 04/10/16 0001)  famotidine (PEPCID) IVPB 20 mg premix (not administered)  chlorhexidine gluconate (MEDLINE KIT) (PERIDEX) 0.12 % solution 15 mL (not administered)  MEDLINE mouth rinse (not administered)  propofol (DIPRIVAN) 1000 MG/100ML infusion (not administered)  fentaNYL (SUBLIMAZE) injection 50 mcg (not administered)  fentaNYL (SUBLIMAZE) injection 50 mcg (not administered)  iopamidol (ISOVUE-370) 76 % injection (50 mLs  Contrast Given 04/09/16 1907)  alteplase (ACTIVASE) 1 mg/mL infusion 70 mg (70 mg Intravenous New Bag/Given 04/09/16 1904)   stroke: mapping our early stages of recovery book ( Does not apply  Given 04/09/16 2358)  iopamidol (ISOVUE-300) 61 % injection (80 mLs  Contrast Given 04/09/16 2216)  ceFAZolin (ANCEF) IVPB 2g/100 mL premix (2 g Intravenous Given 04/09/16 2015)  ceFAZolin (ANCEF) 2-4 GM/100ML-% IVPB (  Override pull for Anesthesia 04/09/16 2015)  eptifibatide (INTEGRILIN) 20 MG/10ML injection (1.8 mg Intra-arterial Given 04/09/16 2126)  iopamidol (ISOVUE-300) 61 % injection (20 mLs  Contrast Given 04/09/16 2217)     Initial Impression / Assessment and Plan / ED Course  I have reviewed the triage vital signs and the nursing notes.  Pertinent labs & imaging results that were available during my care of the patient were reviewed by me and considered in my medical decision making (see chart for details).  Clinical Course     [age].o. female presents with sudden onset aphasia and right sided weakness. She is nonverbal on arrival. No bleed on CT. Protecting her airway appropriately on arrival. Neurology saw Pt and recommended tPA administration. Pt will go to IR for possible intervention. Will admit to neurology ICU following definitive evaluation to continue close monitoring.   Final Clinical Impressions(s) / ED Diagnoses   Final [facility]:  [facility]   New Prescriptions New Prescriptions   No medications on file     DaLeo GrosserMD 04/10/16 0147

## 2016-04-09 NOTE — Code Documentation (Signed)
Patient last known well this afternoon at 1730.  She was sitting with family at the table when she stopped answering their questions.  They assisted her to the chair then they noted that she was leaning to the right.  EMS called Code Stroke at 1811 en route.  Patient arrived at [id number].  EDP assessed patient upon arrival and cleared for Ct.  Stat labs and head CT done.  Dr Benedict NeedyNath at bedside to assess patient while in CT.  NIHSS 24.  Multiple attempts for 18 or 20 gage IV for testing.  Foley placed.  TPa Started at 1904 via hand IV.  During CTA left upper arm IV exstravasated.  Radiologist assessed site.  Dr Benedict NeedyNath paged Dr D regarding patient at [id number].  Patient to IR at 1940.  Family at bedside, Dr Benedict NeedyNath and Dr D spoke with family.  Patient began to follow commands after TPA was started and she was able to communicate alittle, but still with sever Aphasia and right hemi. Hand off to IR team

## 2016-04-09 NOTE — Transfer of Care (Signed)
Immediate Anesthesia Transfer of Care Note  Patient: Catherine Day  Procedure(s) Performed: Procedure(s): RADIOLOGY WITH ANESTHESIA (N/A)  Patient Location: SICU  Anesthesia Type:General  Level of Consciousness: Patient remains intubated per anesthesia plan  Airway & Oxygen Therapy: Patient remains intubated per anesthesia plan and Patient placed on Ventilator (see vital sign flow sheet for setting)  Post-op Assessment: Report given to RN and Post -op Vital signs reviewed and stable  Post vital signs: Reviewed and stable  Last Vitals:  Vitals:   04/09/16 1930 04/09/16 1945  BP: 179/75 183/66  Pulse: 78 77  Resp: 24 23    Last Pain:  Vitals:   04/09/16 1956  PainSc: 0-No pain         Complications: No apparent anesthesia complications

## 2016-04-09 NOTE — ED Triage Notes (Signed)
Pt brought to ED by EMS from home after having complete right side weakness completely flaccid, pt having left side gaze preference, \CBG 224, pt is O2 dependent at home, none verbal at home.

## 2016-04-09 NOTE — Anesthesia Procedure Notes (Addendum)
Central Venous Catheter Insertion Performed by: anesthesiologist 04/09/2016 8:22 PM Patient location: OOR procedure area. Preanesthetic checklist: patient identified, IV checked, site marked, risks and benefits discussed, surgical consent, monitors and equipment checked, pre-op evaluation, timeout performed and anesthesia consent Position: supine Lidocaine 1% used for infiltration Landmarks identified and Seldinger technique used Catheter size: 7 Fr Central line was placed.Triple lumen Procedure performed using ultrasound guided technique. Attempts: 1 Following insertion, line sutured. Post procedure assessment: blood return through all ports, free fluid flow and no air. Patient tolerated the procedure well with no immediate complications.

## 2016-04-09 NOTE — Procedures (Signed)
S/P bilateral common carotid arteriograms  followed  by complete revascularization of occluded LT MCA ,Lt ACA and Lt ICA terminus with x1 PASS WITH A COMBINATION OF solitaire 4mm x 40 mm retrieval device with simultaneous aspiration at the intermediate guide catheter and flow gate guide catheter, and 3.6 mg of superselective intracranial  INTEGRELIN achieving a TICI 3 reperfusion

## 2016-04-09 NOTE — ED Notes (Signed)
This patient has received 50 ml's of IV contrast (Isovue370). Contrast extravasated into Left upper arm during an angio head and neck exam.  The exam was performed on Thursday 04/09/16 at 1930  Site / affected area assessed by Dr Gerome Samavid Williams

## 2016-04-10 ENCOUNTER — Inpatient Hospital Stay (HOSPITAL_COMMUNITY): Payer: Medicare Other

## 2016-04-10 ENCOUNTER — Encounter (HOSPITAL_COMMUNITY): Payer: Self-pay | Admitting: Interventional Radiology

## 2016-04-10 DIAGNOSIS — I63132 Cerebral infarction due to embolism of left carotid artery: Secondary | ICD-10-CM

## 2016-04-10 DIAGNOSIS — I63312 Cerebral infarction due to thrombosis of left middle cerebral artery: Secondary | ICD-10-CM

## 2016-04-10 LAB — CBC WITH DIFFERENTIAL/PLATELET
Basophils Absolute: 0 10*3/uL (ref 0.0–0.1)
Basophils Relative: 0 %
EOS ABS: 0.1 10*3/uL (ref 0.0–0.7)
Eosinophils Relative: 0 %
HEMATOCRIT: 37.6 % (ref 36.0–46.0)
HEMOGLOBIN: 12.1 g/dL (ref 12.0–15.0)
LYMPHS ABS: 1.6 10*3/uL (ref 0.7–4.0)
Lymphocytes Relative: 10 %
MCH: 27.1 pg (ref 26.0–34.0)
MCHC: 32.2 g/dL (ref 30.0–36.0)
MCV: 84.1 fL (ref 78.0–100.0)
Monocytes Absolute: 0.8 10*3/uL (ref 0.1–1.0)
Monocytes Relative: 5 %
NEUTROS ABS: 14.1 10*3/uL — AB (ref 1.7–7.7)
NEUTROS PCT: 85 %
Platelets: 142 10*3/uL — ABNORMAL LOW (ref 150–400)
RBC: 4.47 MIL/uL (ref 3.87–5.11)
RDW: 13.9 % (ref 11.5–15.5)
WBC: 16.6 10*3/uL — ABNORMAL HIGH (ref 4.0–10.5)

## 2016-04-10 LAB — LIPID PANEL
CHOL/HDL RATIO: 3.1 ratio
CHOLESTEROL: 159 mg/dL (ref 0–200)
HDL: 52 mg/dL (ref >40)
LDL Cholesterol: 92 mg/dL (ref 0–99)
Triglycerides: 76 mg/dL (ref <150)
VLDL: 15 mg/dL (ref 0–40)

## 2016-04-10 LAB — BLOOD GAS, ARTERIAL
Acid-base deficit: 2.6 mmol/L — ABNORMAL HIGH (ref 0.0–2.0)
Bicarbonate: 21.4 mmol/L (ref 20.0–28.0)
DRAWN BY: 244871
FIO2: 40
O2 SAT: 96.7 %
PCO2 ART: 35.1 mmHg (ref 32.0–48.0)
PEEP: 5 cmH2O
PO2 ART: 87.7 mmHg (ref 83.0–108.0)
Patient temperature: 98.6
RATE: 15 resp/min
VT: 500 mL
pH, Arterial: 7.403 (ref 7.350–7.450)

## 2016-04-10 LAB — BASIC METABOLIC PANEL
Anion gap: 7 (ref 5–15)
BUN: 22 mg/dL — AB (ref 6–20)
CHLORIDE: 103 mmol/L (ref 101–111)
CO2: 26 mmol/L (ref 22–32)
CREATININE: 0.92 mg/dL (ref 0.44–1.00)
Calcium: 7.5 mg/dL — ABNORMAL LOW (ref 8.9–10.3)
GFR calc Af Amer: >60 mL/min (ref >60)
GFR calc non Af Amer: 55 mL/min — ABNORMAL LOW (ref >60)
GLUCOSE: 117 mg/dL — AB (ref 65–99)
Potassium: 3.7 mmol/L (ref 3.5–5.1)
Sodium: 136 mmol/L (ref 135–145)

## 2016-04-10 LAB — MRSA PCR SCREENING: MRSA by PCR: NEGATIVE

## 2016-04-10 MED ORDER — BUDESONIDE 0.5 MG/2ML IN SUSP
0.5000 mg | Freq: Two times a day (BID) | RESPIRATORY_TRACT | Status: DC
  Administered 2016-04-10 (×2): 0.5 mg via RESPIRATORY_TRACT
  Filled 2016-04-10 (×2): qty 2

## 2016-04-10 NOTE — Progress Notes (Signed)
Neuro IR 04/10/16 0944  Rt 9 french femoral sheath removed at 0924.  Not able to use a closure device because the sheath was approximately 6cm out of the skin site.  Distal pulses intact.  VPAD and manual pressure used to achieve hemostasis at 0955.  No acute complications.   Pressure dressing applied.  [facility].  Lt 4 french femoral sheath removed at 0950.  VPAD and manual pressure applied to achieve hemostasis at 1010.  Distal pulses intact.  No acute complications.  Pressure dressing applied.  [facility].    [facility] Jailynne Opperman Catherine Day

## 2016-04-10 NOTE — Addendum Note (Signed)
Addendum  created 04/10/16 1715 by Shireen Quanavid R Jaclyn Andy, CRNA   Anesthesia Event edited

## 2016-04-10 NOTE — Evaluation (Signed)
SLP Cancellation Note  Patient Details Name: Hughie ClossRegina Opara MRN: [id number] DOB: 05/01/1928   Cancelled treatment:       Reason Eval/Treat Not Completed: Other (comment) (pt intubated)   Mills KollerKimball, Robertson Colclough Ann Tawanna Funk, MS [facility] SLP [phone]

## 2016-04-10 NOTE — H&P (Signed)
Admission H&P    Chief Complaint: right sided weakness HPI: Catherine Day is an 82 y.o. female who was brought in by family because of sudden onset of right sided weakness and inability to speak.  The hx is obtained from the daughter and chart review.  The daughter noticed the sx suddenly and called EMS.  The patient has no prior h/o stroke.  She was not routinely taking an ASA daily.  She is generally functional except for impairment from her chronic lung disease.  History of Stroke: No.  Date last known well:Date: 04/09/2016 Time last known well: Time: 17:30  tPA Given: Yes mRankin:  No past medical history on file.  No past surgical history on file.  No family history on file. Social History:  has no tobacco, alcohol, and drug history on file.  Allergies: No Known Allergies   (Not in a hospital admission)  ROS: Unable to obtain due to aphasia  Physical Examination: Blood pressure 183/66, pulse 77, resp. rate 23, weight 77.4 kg (170 lb 10.2 oz), SpO2 98 %.  Neurologic Examination: MS - alert, non-conversant, speech is garbled, not following commands CN - has left gaze preference and right hemianopsia Motor - has dense right hemiplegia; normal on left Sensory - could not assess coor - no dysmetria on the left side DTR - decreased but symmetric Station/gait - not tested NIHSS = 17  Lab Results Last 48 Hours        Results for orders placed or performed during the hospital encounter of 04/09/16 (from the past 48 hour(s))  Ethanol     Status: None   Collection Time: 04/09/16  6:25 PM  Result Value Ref Range   Alcohol, Ethyl (B) <5 <5 mg/dL    Comment:        LOWEST DETECTABLE LIMIT FOR SERUM ALCOHOL IS 5 mg/dL FOR MEDICAL PURPOSES ONLY  CBG monitoring, ED     Status: Abnormal   Collection Time: 04/09/16  6:25 PM  Result Value Ref Range   Glucose-Capillary 224 (H) 65 - 99 mg/dL   Comment 1 Notify RN    Comment 2 Document in Chart    Protime-INR     Status: None   Collection Time: 04/09/16  6:27 PM  Result Value Ref Range   Prothrombin Time 14.6 11.4 - 15.2 seconds   INR 1.14   APTT     Status: None   Collection Time: 04/09/16  6:27 PM  Result Value Ref Range   aPTT 25 24 - 36 seconds  CBC     Status: Abnormal   Collection Time: 04/09/16  6:27 PM  Result Value Ref Range   WBC 12.9 (H) 4.0 - 10.5 K/uL   RBC 5.26 (H) 3.87 - 5.11 MIL/uL   Hemoglobin 14.5 12.0 - 15.0 g/dL   HCT 44.1 36.0 - 46.0 %   MCV 83.8 78.0 - 100.0 fL   MCH 27.6 26.0 - 34.0 pg   MCHC 32.9 30.0 - 36.0 g/dL   RDW 13.9 11.5 - 15.5 %   Platelets 168 150 - 400 K/uL  Differential     Status: Abnormal   Collection Time: 04/09/16  6:27 PM  Result Value Ref Range   Neutrophils Relative % 91 %   Neutro Abs 11.7 (H) 1.7 - 7.7 K/uL   Lymphocytes Relative 6 %   Lymphs Abs 0.8 0.7 - 4.0 K/uL   Monocytes Relative 3 %   Monocytes Absolute 0.3 0.1 - 1.0 K/uL   Eosinophils Relative  0 %   Eosinophils Absolute 0.0 0.0 - 0.7 K/uL   Basophils Relative 0 %   Basophils Absolute 0.0 0.0 - 0.1 K/uL  Comprehensive metabolic panel     Status: Abnormal   Collection Time: 04/09/16  6:27 PM  Result Value Ref Range   Sodium 131 (L) 135 - 145 mmol/L   Potassium 3.8 3.5 - 5.1 mmol/L   Chloride 99 (L) 101 - 111 mmol/L   CO2 23 22 - 32 mmol/L   Glucose, Bld 231 (H) 65 - 99 mg/dL   BUN 23 (H) 6 - 20 mg/dL   Creatinine, Ser 0.91 0.44 - 1.00 mg/dL   Calcium 8.5 (L) 8.9 - 10.3 mg/dL   Total Protein 7.5 6.5 - 8.1 g/dL   Albumin 3.2 (L) 3.5 - 5.0 g/dL   AST 22 15 - 41 U/L   ALT 17 14 - 54 U/L   Alkaline Phosphatase 61 38 - 126 U/L   Total Bilirubin 0.7 0.3 - 1.2 mg/dL   GFR calc non Af Amer 56 (L) >60 mL/min   GFR calc Af Amer >60 >60 mL/min    Comment: (NOTE) The eGFR has been calculated using the CKD EPI equation. This calculation has not been validated in all clinical situations. eGFR's persistently <60 mL/min  signify possible Chronic Kidney Disease.   Anion gap 9 5 - 15  I-stat troponin, ED (not at [facility], [facility])     Status: None   Collection Time: 04/09/16  6:31 PM  Result Value Ref Range   Troponin i, poc 0.00 0.00 - 0.08 ng/mL   Comment 3            Comment: Due to the release kinetics of cTnI, a negative result within the first hours of the onset of symptoms does not rule out myocardial infarction with certainty. If myocardial infarction is still suspected, repeat the test at appropriate intervals.  I-Stat Chem 8, ED  (not at [facility], [facility])     Status: Abnormal   Collection Time: 04/09/16  6:34 PM  Result Value Ref Range   Sodium 134 (L) 135 - 145 mmol/L   Potassium 4.1 3.5 - 5.1 mmol/L   Chloride 99 (L) 101 - 111 mmol/L   BUN 31 (H) 6 - 20 mg/dL   Creatinine, Ser 0.90 0.44 - 1.00 mg/dL   Glucose, Bld 233 (H) 65 - 99 mg/dL   Calcium, Ion 1.03 (L) 1.15 - 1.40 mmol/L   TCO2 25 0 - 100 mmol/L   Hemoglobin 15.6 (H) 12.0 - 15.0 g/dL   HCT 46.0 36.0 - 46.0 %      Imaging Results (Last 48 hours)  Ct Head Code Stroke Wo Contrast`  Result Date: 04/09/2016 CLINICAL DATA:  Code stroke for right-sided weakness and loss of speech. EXAM: CT HEAD WITHOUT CONTRAST TECHNIQUE: Contiguous axial images were obtained from the base of the skull through the vertex without intravenous contrast. COMPARISON:  None. FINDINGS: Brain: No evidence of acute infarction, hemorrhage, hydrocephalus, extra-axial collection or mass lesion/mass effect. Vascular: Atherosclerotic calcification.  No hyperdense vessel. Skull: Negative Sinuses/Orbits: Mild chronic sinusitis and left mastoiditis. Other: Results were called by telephone at the time of interpretation on 04/09/2016 at 6:41 pm to Dr. Lawana Pai, who verbally acknowledged these results. ASPECTS [facility] Stroke Program Early CT Score) - Ganglionic level infarction (caudate, lentiform nuclei, internal capsule, insula, M1-M3 cortex): 7 - Supraganglionic  infarction (M4-M6 cortex): 3 Total score (0-10 with 10 being normal): 10 IMPRESSION: No acute finding. ASPECTS is  10. Electronically Signed   By: Monte Fantasia M.D.   On: 04/09/2016 18:43    Assessment: 82 y.o. female probable left MCA stroke likely large-vessel involvement.  The patient was a candidate for tPA which was given.  She was also thought to be a thrombectomy candidate and taken for further evaluation.  Stroke Risk Factors - age  Plan: 1. HgbA1c, fasting lipid panel; tPA given 2. MRI, MRA  of the brain without contrast 3. PT consult, OT consult, Speech consult 4. Echocardiogram 5. Prophylactic therapy-Antiplatelet med: Aspirin 6. Risk factor modification 7. Telemetry monitoring in ICU 8. Consideration for possible thrombectomy  Stroke education, treatment, and current plan discussed with patient/significant other: Yes.    Doren Custard, MD Neurology 04/09/2016, 8:17 PM

## 2016-04-10 NOTE — Progress Notes (Signed)
PT Cancellation Note  Patient Details Name: Catherine Day MRN: [id number] DOB: 08/01/1927   Cancelled Treatment:    Reason Eval/Treat Not Completed: Patient not medically ready.  Pt on strict bedrest post tpa and IR intervention last night.  Will hold PT and mobility at this time and need updated activity orders once appropriate.     Alison MurrayMegan F Latausha Flamm, PT  [phone] 04/10/2016, 8:10 AM

## 2016-04-10 NOTE — Progress Notes (Signed)
Pt extubated per MD order. Pt tol well. VS stable. Pt placed on G[place]an 4 LPM. Pt sats 100%, No distress or stridor noted. Pt able to vocalize well. Positive cuff leak prior to extubation. Will cont to monitor.

## 2016-04-10 NOTE — Anesthesia Postprocedure Evaluation (Signed)
Anesthesia Post Note  Patient: Catherine ClossRegina Day  Procedure(s) Performed: Procedure(s) (LRB): RADIOLOGY WITH ANESTHESIA (N/A)  Patient location during evaluation: SICU Anesthesia Type: General Level of consciousness: sedated Pain management: pain level controlled Vital Signs Assessment: post-procedure vital signs reviewed and stable Respiratory status: patient remains intubated per anesthesia plan Cardiovascular status: stable Anesthetic complications: no    Last Vitals:  Vitals:   04/10/16 0445 04/10/16 0500  BP: (!) 98/51 (!) 93/52  Pulse: 80 79  Resp: 15 15  Temp:      Last Pain:  Vitals:   04/10/16 0340  TempSrc: Oral  PainSc:                  Peirce Deveney DAVID

## 2016-04-10 NOTE — Progress Notes (Signed)
Referring Physician(s): Dr Marvel PlanJindong Xu  Supervising Physician: Julieanne Cottoneveshwar, Sanjeev  [facility]  [facility]  Chief Complaint:  CVA IR intervention 11/23 pm S/P bilateral common carotid arteriograms  followed  by complete revascularization of occluded LT MCA ,Lt ACA and Lt ICA terminus with x1 PASS WITH A COMBINATION OF solitaire 4mm x 40 mm retrieval device with simultaneous aspiration at the intermediate guide catheter and flow gate guide catheter, and 3.6 mg of superselective intracranial  INTEGRELIN achieving a TICI 3 reperfusion  Subjective:  On vent Follows all commands Sedated    Allergies: Patient has no known allergies.  Medications: Prior to Admission medications   Medication Sig Start Date End Date Taking? Authorizing Provider  Calcium Citrate-Vitamin D (CITRACAL + D PO) Take 1 tablet by mouth daily.   Yes Historical Provider, MD  denosumab (PROLIA) 60 MG/ML SOLN injection Inject 60 mg into the skin every 6 (six) months. Administer in upper arm, thigh, or abdomen Last injection 03/24/16   Yes Historical Provider, MD  fluticasone furoate-vilanterol (BREO ELLIPTA) 100-25 MCG/INH AEPB Inhale 1 puff into the lungs daily.   Yes Historical Provider, MD  ipratropium-albuterol (DUONEB) 0.5-2.5 (3) MG/3ML SOLN Take 3 mLs by nebulization See admin instructions. Inhale 1 vial by nebulization every morning and may also use 2 more times during the day as needed for shortness of breath or wheezing   Yes Historical Provider, MD  levalbuterol (XOPENEX HFA) 45 MCG/ACT inhaler Inhale 1-2 puffs into the lungs every 4 (four) hours as needed for wheezing or shortness of breath.   Yes Historical Provider, MD  methylPREDNISolone (MEDROL DOSEPAK) 4 MG TBPK tablet Take 4-8 mg by mouth See admin instructions. Tapered course started 04/09/16 am -Day 1: take 2 tablets (8 mg) by mouth with breakfast, 1 tablet (4 mg) with lunch and supper and 2 tablets at bedtime; Day 2: take 1 tablet with  breakfast, lunch and supper and 2 tablets at bedtime; Day 3: take 1 tablet with breakfast, lunch, supper and at bedtime; Day 4: take 1 tablet with breakfast, lunch and at bedtime; Day 5: take 1 tablet with breakfast and at bedtime; Day 6: take 1 tablet with breakfast 04/06/16  Yes Historical Provider, MD  OXYGEN Inhale 2 L into the lungs continuous.   Yes Historical Provider, MD  tiotropium (SPIRIVA) 18 MCG inhalation capsule Place 18 mcg into inhaler and inhale daily.   Yes Historical Provider, MD  tobramycin, PF, (TOBI) 300 MG/5ML nebulizer solution Take 300 mg by nebulization daily.   Yes Historical Provider, MD  valsartan-hydrochlorothiazide (DIOVAN-HCT) 80-12.5 MG tablet Take 1 tablet by mouth daily.   Yes Historical Provider, MD     Vital Signs: BP 132/76   Pulse (!) 101   Temp 97.9 F (36.6 C) (Oral)   Resp (!) 25   Ht 5\' 6"  (1.676 m)   Wt 170 lb 10.2 oz (77.4 kg)   SpO2 97%   BMI 27.54 kg/m   Physical Exam  HENT:  Opens eyes and mouth to command  Musculoskeletal:  Moves all 4s to command Strength = Rt groin NT no bleeding sheaths out; soft no hematoma B B feet 2+ pulses  Neurological: She is alert.  Skin: Skin is warm and dry.  Nursing note and vitals reviewed.   Imaging: Ct Head Wo Contrast  Result Date: 04/09/2016 CLINICAL DATA:  82 y/o F; code stroke post transarterial intervention. EXAM: CT HEAD WITHOUT CONTRAST TECHNIQUE: Contiguous axial images were obtained from the base of  the skull through the vertex without intravenous contrast. COMPARISON:  04/09/2016 CT head. FINDINGS: Brain: No large territory acute infarct or intracranial hemorrhage identified. Patchy foci of hypoattenuation in periventricular and subcortical white matter may represent areas of infarction or chronic microvascular ischemic changes and are stable. Mild parenchymal volume loss. No focal mass effect. No extra-axial collection. No effacement of basilar cisterns. Vascular: Persistent  opacification from previous contrast administration. Calcific atherosclerosis of carotid siphons. Skull: Normal. Negative for fracture or focal lesion. Sinuses/Orbits: Partial opacification of ethmoid and sphenoid sinuses with aerosolized secretions and partial opacification of mastoid air cells probably related to intubation. Orbits are unremarkable. Other: None. IMPRESSION: No large territory acute infarct or intracranial hemorrhage identified. Patchy foci of hypoattenuation in periventricular and subcortical white matter may represent areas of infarction or chronic microvascular ischemic changes and are stable. If clinically indicated MRI is more sensitive for acute ischemia. Electronically Signed   By: Mitzi Hansen M.D.   On: 04/09/2016 22:51   Dg Chest Port 1 View  Result Date: 04/10/2016 CLINICAL DATA:  Endotracheal tube and orogastric tube placement. Initial encounter. EXAM: PORTABLE CHEST 1 VIEW COMPARISON:  None. FINDINGS: The patient's endotracheal tube is seen ending 2 cm above the carina. A right IJ line is noted ending about the distal SVC. An enteric tube is noted extending below the diaphragm. Mild bibasilar atelectasis is noted. No pleural effusion or pneumothorax is seen. The cardiomediastinal silhouette is borderline normal in size. No acute osseous abnormalities are identified. IMPRESSION: 1. Endotracheal tube seen ending 2 cm above the carina. 2. Right IJ line noted ending about the distal SVC. 3. Enteric tube noted extending below the diaphragm. 4. Mild bibasilar atelectasis. Electronically Signed   By: Roanna Raider M.D.   On: 04/10/2016 00:02   Dg Abd Portable 1v  Result Date: 04/09/2016 CLINICAL DATA:  82 y/o  F; OG tube placement. EXAM: PORTABLE ABDOMEN - 1 VIEW COMPARISON:  None. FINDINGS: Patient is rotated. Normal bowel gas pattern. Enteric tube tip is below the diaphragm probably in the distal stomach. Slight densities projecting over the kidneys bilaterally may  represent stones. Degenerative changes of the spine. IMPRESSION: Enteric tube tip probably in distal stomach. Densities projecting over kidneys may represent nephrolithiasis. Electronically Signed   By: Mitzi Hansen M.D.   On: 04/09/2016 23:56   Ct Head Code Stroke Wo Contrast`  Result Date: 04/09/2016 CLINICAL DATA:  Code stroke for right-sided weakness and loss of speech. EXAM: CT HEAD WITHOUT CONTRAST TECHNIQUE: Contiguous axial images were obtained from the base of the skull through the vertex without intravenous contrast. COMPARISON:  None. FINDINGS: Brain: No evidence of acute infarction, hemorrhage, hydrocephalus, extra-axial collection or mass lesion/mass effect. Vascular: Atherosclerotic calcification.  No hyperdense vessel. Skull: Negative Sinuses/Orbits: Mild chronic sinusitis and left mastoiditis. Other: Results were called by telephone at the time of interpretation on 04/09/2016 at 6:41 pm to Dr. Benedict Needy, who verbally acknowledged these results. ASPECTS [facility] Stroke Program Early CT Score) - Ganglionic level infarction (caudate, lentiform nuclei, internal capsule, insula, M1-M3 cortex): 7 - Supraganglionic infarction (M4-M6 cortex): 3 Total score (0-10 with 10 being normal): 10 IMPRESSION: No acute finding. ASPECTS is 10. Electronically Signed   By: Marnee Spring M.D.   On: 04/09/2016 18:43    Labs:  CBC:  Recent Labs  04/09/16 1827 04/09/16 1834 04/10/16 0636  WBC 12.9*  --  16.6*  HGB 14.5 15.6* 12.1  HCT 44.1 46.0 37.6  PLT 168  --  142*  COAGS:  Recent Labs  04/09/16 1827  INR 1.14  APTT 25    BMP:  Recent Labs  04/09/16 1827 04/09/16 1834 04/10/16 0636  NA 131* 134* 136  K 3.8 4.1 3.7  CL 99* 99* 103  CO2 23  --  26  GLUCOSE 231* 233* 117*  BUN 23* 31* 22*  CALCIUM 8.5*  --  7.5*  CREATININE 0.91 0.90 0.92  GFRNONAA 56*  --  55*  GFRAA >60  --  >60    LIVER FUNCTION TESTS:  Recent Labs  04/09/16 1827  BILITOT 0.7  AST 22  ALT  17  ALKPHOS 61  PROT 7.5  ALBUMIN 3.2*    Assessment and Plan:  CVA L MCA/ACA/ICA revascularization 11/23 pm in IR  will follow Plan per Dr Roda ShuttersXu  Electronically Signed: Ralene MuskratURPIN,Khalila Buechner A 04/10/2016, 10:14 AM   I spent a total of 15 Minutes at the the patient's bedside AND on the patient's hospital floor or unit, greater than 50% of which was counseling/coordinating care for L MCA/ACA/ICA revasc

## 2016-04-10 NOTE — Progress Notes (Signed)
STROKE TEAM PROGRESS NOTE   HISTORY OF PRESENT ILLNESS (per record) Catherine Day is an 82 y.o. female who was brought in by family because of sudden onset of right sided weakness and inability to speak.  The hx is obtained from the daughter and chart review.  The daughter noticed the sx suddenly and called EMS.  The patient has no prior h/o stroke.  She was not routinely taking an ASA daily.  She is generally functional except for impairment from her chronic lung disease. History of Stroke: No.  TPA 04/09/2016 at 1900.  Date last known well:Date: 04/09/2016 Time last known well: Time: 17:30  Interventional Radiology S/P bilateral common carotid arteriograms  followed  by complete revascularization of occluded LT MCA ,Lt ACA and Lt ICA terminus with x1 PASS WITH A COMBINATION OF solitaire 4mm x 40 mm retrieval device with simultaneous aspiration at the intermediate guide catheter and flow gate guide catheter, and 3.6 mg of superselective intracranial  INTEGRELIN achieving a TICI 3 reperfusion  SUBJECTIVE (INTERVAL HISTORY) Her RN is at the bedside.  Overall she feels her condition is rapidly improving. She is awake alert although still intubated. Follows commands. Right arm and leg moves on commands. Still has b/l femoral sheath in. MRI this evening.    OBJECTIVE Temp:  [98.6 F (37 C)] 98.6 F (37 C) (11/24 0340) Pulse Rate:  [69-101] 99 (11/24 0730) Cardiac Rhythm: Normal sinus rhythm (11/23 1840) Resp:  [14-25] 19 (11/24 0730) BP: (93-183)/(50-96) 130/79 (11/24 0730) SpO2:  [96 %-100 %] 100 % (11/24 0730) Arterial Line BP: (78-234)/(44-228) 117/61 (11/24 0730) FiO2 (%):  [40 %-50 %] 50 % (11/24 0358) Weight:  [77.4 kg (170 lb 10.2 oz)] 77.4 kg (170 lb 10.2 oz) (11/23 1800)  CBC:  Recent Labs Lab 04/09/16 1827 04/09/16 1834 04/10/16 0636  WBC 12.9*  --  16.6*  NEUTROABS 11.7*  --  14.1*  HGB 14.5 15.6* 12.1  HCT 44.1 46.0 37.6  MCV 83.8  --  84.1  PLT 168  --  142*     Basic Metabolic Panel:  Recent Labs Lab 04/09/16 1827 04/09/16 1834 04/10/16 0636  NA 131* 134* 136  K 3.8 4.1 3.7  CL 99* 99* 103  CO2 23  --  26  GLUCOSE 231* 233* 117*  BUN 23* 31* 22*  CREATININE 0.91 0.90 0.92  CALCIUM 8.5*  --  7.5*    Lipid Panel:    Component Value Date/Time   CHOL 159 04/10/2016 0637   TRIG 76 04/10/2016 0637   HDL 52 04/10/2016 0637   CHOLHDL 3.1 04/10/2016 0637   VLDL 15 04/10/2016 0637   LDLCALC 92 04/10/2016 0637   HgbA1c: No results found for: HGBA1C Urine Drug Screen: No results found for: LABOPIA, COCAINSCRNUR, LABBENZ, AMPHETMU, THCU, LABBARB    IMAGING I have personally reviewed the radiological images below and agree with the radiology interpretations.  Ct Head Wo Contrast 04/09/2016 No large territory acute infarct or intracranial hemorrhage identified. Patchy foci of hypoattenuation in periventricular and subcortical white matter may represent areas of infarction or chronic microvascular ischemic changes and are stable. If clinically indicated MRI is more sensitive for acute ischemia.   Dg Chest Port 1 View 04/10/2016 1. Endotracheal tube seen ending 2 cm above the carina.  2. Right IJ line noted ending about the distal SVC.  3. Enteric tube noted extending below the diaphragm.  4. Mild bibasilar atelectasis.   Ct Head Code Stroke Wo Contrast` 04/09/2016 No acute finding.  ASPECTS is 10.   Cerebral angio 04/09/2016 S/P bilateral common carotid arteriograms  followed  by complete revascularization of occluded LT MCA ,Lt ACA and Lt ICA terminus with x1 PASS WITH A COMBINATION OF solitaire 4mm x 40 mm retrieval device with simultaneous aspiration at the intermediate guide catheter and flow gate guide catheter, and 3.6 mg of superselective intracranial  INTEGRELIN achieving a TICI 3 reperfusion  MRI Head / MRA Brain Pending  TTE pending    PHYSICAL EXAM  Temp:  [97.9 F (36.6 C)-98.6 F (37 C)] 97.9 F (36.6 C)  (11/24 0800) Pulse Rate:  [69-101] 100 (11/24 0830) Resp:  [14-25] 25 (11/24 0830) BP: (93-183)/(50-96) 134/60 (11/24 0830) SpO2:  [96 %-100 %] 98 % (11/24 0830) Arterial Line BP: (78-234)/(44-228) 108/68 (11/24 0830) FiO2 (%):  [40 %-50 %] 40 % (11/24 0804) Weight:  [170 lb 10.2 oz (77.4 kg)] 170 lb 10.2 oz (77.4 kg) (11/23 1800)  General - Well nourished, well developed, intubated on low dose propofol.  Ophthalmologic - Fundi not visualized due to lethargy.  Cardiovascular - Regular rate and rhythm.  Neuro - intubated on low dose propofol due to femoral sheath. Mild lethargy but easily arousable, follows all midline and peripheral commands. Eyes in mid position, able to attend both sides, PERRL, EOMI, facial symmetry can not be tested due to ET. Move b/l arms symmetrically 4/5 proximal and distally. B/l LE proximal strength not tested due to femoral sheath in place, however, b/l toe DF and PF symmetrical at 4/5. DTR 1+ bilaterally and no babinski. Sensation symmetrical although limited testing due to ET. Slow FTN but symmetrical. Gait not tested.    ASSESSMENT/PLAN [name] is a 82 y.o. female with history of COPD and hypertension presenting with sudden onset of right sided weakness and inability to speak.   She received IV t-PA and underwent mechanical thrombectomy with complete revascularization of occluded LT MCA ,Lt ACA and Lt ICA terminus.  Stroke:  Dominant left brain infarct s/p tPA and revascularization of occluded LT MCA ,Lt ACA and Lt ICA terminus, embolic pattern secondary to likely cardioembolic source.  Resultant  Rapid neuro improvement  MRI - pending  MRA - pending  Cerebral angiogram - complete revascularization of occluded LT MCA ,Lt ACA and Lt ICA terminus  2D Echo - pending  May consider TEE and loop recorder depending on further stroke work up   LDL - 92  HgbA1c - pending  VTE prophylaxis - SCDs  Diet NPO time specified  No antithrombotic prior  to admission, now on No antithrombotic - secondary to within 24 hours of TPA therapy.  Ongoing aggressive stroke risk factor management  Therapy recommendations: pending  Disposition:  Pending  Hypertension  Stable  BP goal 120 - 160 post mechanical thrombectomy  Long-term BP goal normotensive  Hyperlipidemia  Home meds:  No lipid lowering medications prior to admission  LDL 92, goal < 70  Add Lipitor 20 mg daily when PO access available.  Continue statin at discharge  Other Stroke Risk Factors  Advanced age  Other Active Problems  Contrast extravasation left arm after IV infiltrated.  Leukocytosis - 16.6 (afebrile)  Hospital day # 1  This patient is critically ill due to left terminal ICA, MCA and ACA occlusion s/p tPA and mechanical thrombectomy and at significant risk of neurological worsening, death form hemorrhagic transformation, recurrent stroke, brain herniation, seizure. This patient's care requires constant monitoring of vital signs, hemodynamics, respiratory and cardiac monitoring, review of multiple  databases, neurological assessment, discussion with family, other specialists and medical decision making of high complexity. I spent 35 minutes of neurocritical care time in the care of this patient.  Marvel PlanJindong Bridgette Wolden, MD PhD Stroke Neurology 04/10/2016 9:33 AM   To contact Stroke Continuity provider, please refer to [email or web address]. After hours, contact General Neurology

## 2016-04-10 NOTE — Progress Notes (Signed)
PULMONARY / CRITICAL CARE MEDICINE   Name: Catherine ClossRegina Day MRN: [id number] DOB: 01/12/1928    ADMISSION DATE:  04/09/2016 CONSULTATION DATE:  04/09/2016  REFERRING MD:  Dr. Amada JupiterKirkpatrick  CHIEF COMPLAINT:  CVA  HISTORY OF PRESENT ILLNESS:   82 year old female with PMH COPD, HTN,  presented to [facility] with sudden onset R sided-weakness and aphasia. EMS was immediately called. Code stroke initiated in ED and CT head without acute findings. tPa was administered in ED and he was taken to IR for neuro intervention. Where she underwent revascularization of occluded Lt MCA, ACA and ICA terminus. She was electively intubated for the procedure and sent to ICU for recovery and frequent assessment. PCCM to consult for vent management.   SUBJECTIVE:  Awake and alert  VITAL SIGNS: BP 134/60   Pulse 99   Temp 97.9 F (36.6 C) (Oral)   Resp (!) 23   Ht 5\' 6"  (1.676 m)   Wt 170 lb 10.2 oz (77.4 kg)   SpO2 97%   BMI 27.54 kg/m   HEMODYNAMICS:    VENTILATOR SETTINGS: Vent Mode: PRVC FiO2 (%):  [40 %-50 %] 40 % Set Rate:  [15 bmp] 15 bmp Vt Set:  [500 mL] 500 mL PEEP:  [5 cmH20] 5 cmH20 Plateau Pressure:  [14 cmH20] 14 cmH20  INTAKE / OUTPUT: I/O last 3 completed shifts: In: 2446 [I.V.:2446] Out: 665 [Urine:590; Blood:75]  PHYSICAL EXAMINATION: General:  Elderly female in NAD on vent Neuro:  Sedated on vent. decreased R, follows commands HEENT:  [place]/AT, PERRL Cardiovascular:  RRR, no MRG Lungs:  deceased in bases Abdomen:  Soft, non-distended Musculoskeletal:   Skin:  (-) edema. Warm, dry  LABS:  BMET  Recent Labs Lab 04/09/16 1827 04/09/16 1834 04/10/16 0636  NA 131* 134* 136  K 3.8 4.1 3.7  CL 99* 99* 103  CO2 23  --  26  BUN 23* 31* 22*  CREATININE 0.91 0.90 0.92  GLUCOSE 231* 233* 117*    Electrolytes  Recent Labs Lab 04/09/16 1827 04/10/16 0636  CALCIUM 8.5* 7.5*    CBC  Recent Labs Lab 04/09/16 1827 04/09/16 1834 04/10/16 0636  WBC 12.9*  --   16.6*  HGB 14.5 15.6* 12.1  HCT 44.1 46.0 37.6  PLT 168  --  142*    Coag's  Recent Labs Lab 04/09/16 1827  APTT 25  INR 1.14    Sepsis Markers No results for input(s): LATICACIDVEN, PROCALCITON, O2SATVEN in the last 168 hours.  ABG  Recent Labs Lab 04/09/16 0255  PHART 7.403  PCO2ART 35.1  PO2ART 87.7    Liver Enzymes  Recent Labs Lab 04/09/16 1827  AST 22  ALT 17  ALKPHOS 61  BILITOT 0.7  ALBUMIN 3.2*    Cardiac Enzymes No results for input(s): TROPONINI, PROBNP in the last 168 hours.  Glucose  Recent Labs Lab 04/09/16 1825 04/09/16 2259  GLUCAP 224* 126*    Imaging Ct Head Wo Contrast  Result Date: 04/09/2016 CLINICAL DATA:  82 y/o F; code stroke post transarterial intervention. EXAM: CT HEAD WITHOUT CONTRAST TECHNIQUE: Contiguous axial images were obtained from the base of the skull through the vertex without intravenous contrast. COMPARISON:  04/09/2016 CT head. FINDINGS: Brain: No large territory acute infarct or intracranial hemorrhage identified. Patchy foci of hypoattenuation in periventricular and subcortical white matter may represent areas of infarction or chronic microvascular ischemic changes and are stable. Mild parenchymal volume loss. No focal mass effect. No extra-axial collection. No effacement  of basilar cisterns. Vascular: Persistent opacification from previous contrast administration. Calcific atherosclerosis of carotid siphons. Skull: Normal. Negative for fracture or focal lesion. Sinuses/Orbits: Partial opacification of ethmoid and sphenoid sinuses with aerosolized secretions and partial opacification of mastoid air cells probably related to intubation. Orbits are unremarkable. Other: None. IMPRESSION: No large territory acute infarct or intracranial hemorrhage identified. Patchy foci of hypoattenuation in periventricular and subcortical white matter may represent areas of infarction or chronic microvascular ischemic changes and are  stable. If clinically indicated MRI is more sensitive for acute ischemia. Electronically Signed   By: Mitzi HansenLance  Furusawa-Stratton M.D.   On: 04/09/2016 22:51   Dg Chest Port 1 View  Result Date: 04/10/2016 CLINICAL DATA:  Endotracheal tube and orogastric tube placement. Initial encounter. EXAM: PORTABLE CHEST 1 VIEW COMPARISON:  None. FINDINGS: The patient's endotracheal tube is seen ending 2 cm above the carina. A right IJ line is noted ending about the distal SVC. An enteric tube is noted extending below the diaphragm. Mild bibasilar atelectasis is noted. No pleural effusion or pneumothorax is seen. The cardiomediastinal silhouette is borderline normal in size. No acute osseous abnormalities are identified. IMPRESSION: 1. Endotracheal tube seen ending 2 cm above the carina. 2. Right IJ line noted ending about the distal SVC. 3. Enteric tube noted extending below the diaphragm. 4. Mild bibasilar atelectasis. Electronically Signed   By: Roanna RaiderJeffery  Chang M.D.   On: 04/10/2016 00:02   Dg Abd Portable 1v  Result Date: 04/09/2016 CLINICAL DATA:  82 y/o  F; OG tube placement. EXAM: PORTABLE ABDOMEN - 1 VIEW COMPARISON:  None. FINDINGS: Patient is rotated. Normal bowel gas pattern. Enteric tube tip is below the diaphragm probably in the distal stomach. Slight densities projecting over the kidneys bilaterally may represent stones. Degenerative changes of the spine. IMPRESSION: Enteric tube tip probably in distal stomach. Densities projecting over kidneys may represent nephrolithiasis. Electronically Signed   By: Mitzi HansenLance  Furusawa-Stratton M.D.   On: 04/09/2016 23:56   Ct Head Code Stroke Wo Contrast`  Result Date: 04/09/2016 CLINICAL DATA:  Code stroke for right-sided weakness and loss of speech. EXAM: CT HEAD WITHOUT CONTRAST TECHNIQUE: Contiguous axial images were obtained from the base of the skull through the vertex without intravenous contrast. COMPARISON:  None. FINDINGS: Brain: No evidence of acute  infarction, hemorrhage, hydrocephalus, extra-axial collection or mass lesion/mass effect. Vascular: Atherosclerotic calcification.  No hyperdense vessel. Skull: Negative Sinuses/Orbits: Mild chronic sinusitis and left mastoiditis. Other: Results were called by telephone at the time of interpretation on 04/09/2016 at 6:41 pm to Dr. Benedict NeedyNath, who verbally acknowledged these results. ASPECTS [facility]([place] Stroke Program Early CT Score) - Ganglionic level infarction (caudate, lentiform nuclei, internal capsule, insula, M1-M3 cortex): 7 - Supraganglionic infarction (M4-M6 cortex): 3 Total score (0-10 with 10 being normal): 10 IMPRESSION: No acute finding. ASPECTS is 10. Electronically Signed   By: Marnee SpringJonathon  Watts M.D.   On: 04/09/2016 18:43     STUDIES:  CT head 11/23 > no acute finding  CULTURES:   ANTIBIOTICS:   SIGNIFICANT EVENTS:   LINES/TUBES: ETT 11/23 >11/24 A line 11/23 >  DISCUSSION: Awake and alert and doing well on PS 5/5  ASSESSMENT / PLAN:  PULMONARY A: VDRF in post-operative setting COPD without acute exacerbation  P:  Full vent support. PST in am. Anticipate can extubate if she wakes up.  SBT in AM VAP bundle Scheduled neb Plan to extubate after sheath out  CARDIOVASCULAR A:  HTN  P:  SBP goal per neurology Echo Titrate  cardene drip according to goal SBP per neuro. Off   RENAL  Recent Labs Lab 04/09/16 1827 04/09/16 1834 04/10/16 0636  NA 131* 134* 136   Lab Results  Component Value Date   CREATININE 0.92 04/10/2016   CREATININE 0.90 04/09/2016   CREATININE 0.91 04/09/2016   A:   Hyponatremia  P:   Maintenance IVF   GASTROINTESTINAL A:   No acute issues  P:   NPO Pepcid  HEMATOLOGIC A:   No acute issues  P:    INFECTIOUS A:   Leukocytosis  P:   perioperative ancef Observe for fever trend.   ENDOCRINE CBG (last 3)   Recent Labs  04/09/16 1825 04/09/16 2259  GLUCAP 224* 126*     A:   No acute issues  P:   NPO,  advance once awake CBG and SSI   NEUROLOGIC A:   Acute L MCA CVA, S/P tPA (11/23) S/P revascularization of occluded Lt MCA, ACA and ICA terminus. (11/23)  P:   RASS goal: -1 to -2 Propofol drip  Keep sedated for now.  Plan to extubate once sheath is out. Follows commands and is awake and alert.   FAMILY  - Updates: No family seen at bedside.   - Inter-disciplinary family meet or Palliative Care meeting due by: Dec 1   Critical care time with this patient today : 30 minutes.   Brett Canales Minor ACNP Adolph Pollack PCCM Pager [phone] till 3 pm If no answer page [phone] 04/10/2016, 9:35 AM\   Attending note: I have seen and examined the patient with nurse practitioner/resident and agree with the note. History, labs and imaging reviewed.  82 Y/O with PMH of COPD, HTN admitted with stroke s/p TPA and revascularization of occluded Lt MCA, ACA and ICA terminus.  Blood pressure 133/62, pulse 92, temperature 97.9 F (36.6 C), temperature source Oral, resp. rate 20, height 5\' 6"  (1.676 m), weight 170 lb 10.2 oz (77.4 kg), SpO2 98 %. CVS-RRR RS-Clear Abd- Soft, + BS Ext- No edema  Labs and imaging reviewed.  Doing well on weaning today. No wheezing on examination Plan on extubation once femoral sheath is out and she is able to sit up.  The patient is critically ill with multiple organ systems failure and requires high complexity decision making for assessment and support, frequent evaluation and titration of therapies, application of advanced monitoring technologies and extensive interpretation of multiple databases.  Critical care time - 35 mins. This represents my time independent of the NPs time taking care of the pt.  Chilton Greathouse MD [place] [facility] Pager [phone] If no answer or after 3pm call: [phone] 04/10/2016, 11:08 AM

## 2016-04-10 NOTE — Progress Notes (Signed)
Nutrition Brief Note  Patient identified on the Malnutrition Screening Tool (MST) Report  Wt Readings from Last 15 Encounters:  04/09/16 170 lb 10.2 oz (77.4 kg)   Body mass index is 27.54 kg/m. Patient meets criteria for overweight based on current BMI.   Pt was extubated a short while ago. MST had shown possible weight loss.   Spoke with family. Family reports that pts UBW is 167 lbs or so. They state that patient was eating well up until the stroke, in fact, she suffered this after eating her thanksgiving meal.   Pt is currently NPO pending swallow eval. Pt reportedly does not like Ensure/Boost. Will monitor for diet advancement and add snacks/supplements as warranted.   No nutrition interventions warranted at this time. If nutrition issues arise, please consult RD.   Christophe LouisNathan Eden Toohey RD, LDN, CNSC Clinical Nutrition Pager: [phone] 04/10/2016 4:56 PM

## 2016-04-11 ENCOUNTER — Inpatient Hospital Stay (HOSPITAL_COMMUNITY): Payer: Medicare Other

## 2016-04-11 DIAGNOSIS — I639 Cerebral infarction, unspecified: Secondary | ICD-10-CM | POA: Insufficient documentation

## 2016-04-11 DIAGNOSIS — I6789 Other cerebrovascular disease: Secondary | ICD-10-CM

## 2016-04-11 LAB — BASIC METABOLIC PANEL
Anion gap: 9 (ref 5–15)
BUN: 14 mg/dL (ref 6–20)
CO2: 24 mmol/L (ref 22–32)
Calcium: 7.2 mg/dL — ABNORMAL LOW (ref 8.9–10.3)
Chloride: 105 mmol/L (ref 101–111)
Creatinine, Ser: 0.71 mg/dL (ref 0.44–1.00)
Glucose, Bld: 93 mg/dL (ref 65–99)
POTASSIUM: 3.3 mmol/L — AB (ref 3.5–5.1)
SODIUM: 138 mmol/L (ref 135–145)

## 2016-04-11 LAB — CBC
HCT: 34.3 % — ABNORMAL LOW (ref 36.0–46.0)
Hemoglobin: 10.9 g/dL — ABNORMAL LOW (ref 12.0–15.0)
MCH: 27 pg (ref 26.0–34.0)
MCHC: 31.8 g/dL (ref 30.0–36.0)
MCV: 84.9 fL (ref 78.0–100.0)
PLATELETS: 157 10*3/uL (ref 150–400)
RBC: 4.04 MIL/uL (ref 3.87–5.11)
RDW: 13.8 % (ref 11.5–15.5)
WBC: 15.1 10*3/uL — AB (ref 4.0–10.5)

## 2016-04-11 LAB — ECHOCARDIOGRAM COMPLETE
Height: 66 in
WEIGHTICAEL: 2730.18 [oz_av]

## 2016-04-11 LAB — HEMOGLOBIN A1C
Hgb A1c MFr Bld: 5.8 % — ABNORMAL HIGH (ref 4.8–5.6)
MEAN PLASMA GLUCOSE: 120 mg/dL

## 2016-04-11 MED ORDER — PERFLUTREN LIPID MICROSPHERE
INTRAVENOUS | Status: AC
  Administered 2016-04-11: 10:00:00
  Filled 2016-04-11: qty 10

## 2016-04-11 MED ORDER — ASPIRIN 300 MG RE SUPP
300.0000 mg | Freq: Every day | RECTAL | Status: DC
Start: 2016-04-11 — End: 2016-04-12
  Administered 2016-04-11 – 2016-04-12 (×2): 300 mg via RECTAL
  Filled 2016-04-11 (×2): qty 1

## 2016-04-11 MED ORDER — ASPIRIN EC 325 MG PO TBEC: 325.0000 mg | DELAYED_RELEASE_TABLET | Freq: Every day | ORAL | Status: DC

## 2016-04-11 MED ORDER — IPRATROPIUM-ALBUTEROL 0.5-2.5 (3) MG/3ML IN SOLN: 3.0000 mL | Freq: Four times a day (QID) | RESPIRATORY_TRACT | Status: DC | PRN

## 2016-04-11 MED ORDER — TIOTROPIUM BROMIDE MONOHYDRATE 18 MCG IN CAPS
18.0000 ug | ORAL_CAPSULE | Freq: Every day | RESPIRATORY_TRACT | Status: DC
  Administered 2016-04-11 – 2016-04-14 (×4): 18 ug via RESPIRATORY_TRACT
  Filled 2016-04-11 (×2): qty 5

## 2016-04-11 MED ORDER — PERFLUTREN LIPID MICROSPHERE
1.0000 mL | INTRAVENOUS | Status: AC | PRN
  Administered 2016-04-11: 2 mL via INTRAVENOUS
  Filled 2016-04-11: qty 10

## 2016-04-11 MED ORDER — SODIUM CHLORIDE 0.9 % IV SOLN
30.0000 meq | Freq: Once | INTRAVENOUS | Status: AC
  Administered 2016-04-11: 30 meq via INTRAVENOUS
  Filled 2016-04-11: qty 15

## 2016-04-11 MED ORDER — LEVALBUTEROL TARTRATE 45 MCG/ACT IN AERO: 1.0000 | INHALATION_SPRAY | Freq: Four times a day (QID) | RESPIRATORY_TRACT | Status: DC | PRN

## 2016-04-11 MED ORDER — TOBRAMYCIN 300 MG/5ML IN NEBU
300.0000 mg | INHALATION_SOLUTION | Freq: Every day | RESPIRATORY_TRACT | Status: DC
  Administered 2016-04-11: 300 mg via RESPIRATORY_TRACT
  Filled 2016-04-11 (×5): qty 5

## 2016-04-11 MED ORDER — LEVALBUTEROL HCL 0.63 MG/3ML IN NEBU
0.6300 mg | INHALATION_SOLUTION | Freq: Four times a day (QID) | RESPIRATORY_TRACT | Status: DC | PRN
  Filled 2016-04-11: qty 3

## 2016-04-11 MED ORDER — FLUTICASONE FUROATE-VILANTEROL 100-25 MCG/INH IN AEPB
1.0000 | INHALATION_SPRAY | Freq: Every day | RESPIRATORY_TRACT | Status: DC
  Administered 2016-04-11 – 2016-04-14 (×4): 1 via RESPIRATORY_TRACT
  Filled 2016-04-11: qty 28

## 2016-04-11 NOTE — Progress Notes (Signed)
Pt is breathing shallowly. Gave her an Facilities managerncentive Spirometer but she is only able to get it up to 500.  Will need a lot of encouragement to deep breathe.

## 2016-04-11 NOTE — Evaluation (Signed)
Physical Therapy Evaluation Patient Details Name: Catherine Day MRN: [id number] DOB: 10/07/1927 Today's Date: 04/11/2016   History of Present Illness  82 year old female with PMH COPD, HTN, presented to [facility] with sudden onset R sided-weakness and aphasia. MRI brain-- scattered acute infarcts in the left hemisphere primarily in the left temporal and occipital lobes, and Lt cerebellar artery territory. tPa was administered, taken to IR for revascularization of occluded Lt MCA, ACA and ICA terminus. She was electively intubated for the procedure; extubated 11/24.    Clinical Impression  Pt admitted with above diagnosis. No family present during evaluation with pt changing her answers to questions during interview. Reports she is from [place] and here visiting her son. Pt currently with functional limitations due to the deficits listed below (see PT Problem List). Pt will benefit from skilled PT to increase their independence and safety with mobility to allow discharge to the venue listed below.       Follow Up Recommendations CIR    Equipment Recommendations  Other (comment) (TBA)    Recommendations for Other Services Rehab consult;OT consult;Speech consult     Precautions / Restrictions Precautions Precautions: Fall      Mobility  Bed Mobility Overal bed mobility: Needs Assistance Bed Mobility: Supine to Sit     Supine to sit: Min assist;HOB elevated (ICU air bed; deflated)     General bed mobility comments: required assist to move legs off EOB (slow processing)  Transfers Overall transfer level: Needs assistance Equipment used: None Transfers: Sit to/from [employer] CorporationStand;Stand Pivot Transfers Sit to Stand: Min guard Stand pivot transfers: Min guard       General transfer comment: incr time/effort however no imbalance; wider stande  Ambulation/Gait             General Gait Details: unable with 1 person due to multiple ICU lines  Stairs            Wheelchair  Mobility    Modified Rankin (Stroke Patients Only) Modified Rankin (Stroke Patients Only) Pre-Morbid Rankin Score: No symptoms (per pt; ?accuracy) Modified Rankin: Moderately severe disability     Balance Overall balance assessment: Needs assistance Sitting-balance support: No upper extremity supported;Feet unsupported Sitting balance-Leahy Scale: Fair     Standing balance support: No upper extremity supported Standing balance-Leahy Scale: Fair                               Pertinent Vitals/Pain Pain Assessment: No/denies pain    Home Living Family/patient expects to be discharged to:: Private residence                 Additional Comments: pt reports she lives in [place]; visiting her son    Prior Function Level of Independence: Independent         Comments: per pt     Hand Dominance   Dominant Hand: Right    Extremity/Trunk Assessment   Upper Extremity Assessment: Defer to OT evaluation           Lower Extremity Assessment: RLE deficits/detail;LLE deficits/detail RLE Deficits / Details: AROM WFL; grossly 4/5 LLE Deficits / Details: AROM WFL; ankle DF 5/5     Communication   Communication: Expressive difficulties (?cognition vs word-finding; changes answers)  Cognition Arousal/Alertness: Awake/alert Behavior During Therapy: WFL for tasks assessed/performed Overall Cognitive Status: Impaired/Different from baseline Area of Impairment: Orientation;Attention;Awareness;Problem solving Orientation Level: Time;Situation ("hospital" March 2037) Current Attention Level: Sustained  Awareness:  (pre-intellectual) Problem Solving: Slow processing;Requires verbal cues General Comments: thought she was in hospital because "they found a lump"    General Comments      Exercises     Assessment/Plan    PT Assessment Patient needs continued PT services  PT Problem List Decreased strength;Decreased balance;Decreased mobility;Decreased  knowledge of precautions;Obesity          PT Treatment Interventions DME instruction;Gait training;Stair training;Functional mobility training;Therapeutic activities;Balance training;Neuromuscular re-education;Cognitive remediation;Patient/family education    PT Goals (Current goals can be found in the Care Plan section)  Acute Rehab PT Goals Patient Stated Goal: walk again PT Goal Formulation: With patient Time For Goal Achievement: 04/18/16 Potential to Achieve Goals: Good    Frequency Min 4X/week   Barriers to discharge Other (comment) home setup unknown (visiting son)    Co-evaluation               End of Session Equipment Utilized During Treatment: Gait belt;Oxygen Activity Tolerance: Patient tolerated treatment well Patient left: in chair;with call bell/phone within reach;with chair alarm set;Other (comment) (RT in room) Nurse Communication: Mobility status         Time: [id number] PT Time Calculation (min) (ACUTE ONLY): 32 min   Charges:   PT Evaluation $PT Eval Moderate Complexity: 1 Procedure PT Treatments $Therapeutic Activity: 8-22 mins   PT G CodesScherrie November:        Magdelyn Roebuck P Meloney Feld 04/11/2016, 1:06 PM Pager [phone]

## 2016-04-11 NOTE — Progress Notes (Signed)
  Echocardiogram 2D Echocardiogram with Definity has been performed.  Nolon RodBrown, Tony 04/11/2016, 11:14 AM

## 2016-04-11 NOTE — Progress Notes (Signed)
STROKE TEAM PROGRESS NOTE   HISTORY OF PRESENT ILLNESS (per record) Catherine Day is an 82 y.o. female who was brought in by family because of sudden onset of right sided weakness and inability to speak.  The hx is obtained from the daughter and chart review.  The daughter noticed the sx suddenly and called EMS.  The patient has no prior h/o stroke.  She was not routinely taking an ASA daily.  She is generally functional except for impairment from her chronic lung disease. History of Stroke: No.  TPA 04/09/2016 at 1900.  Date last known well:Date: 04/09/2016 Time last known well: Time: 17:30  Interventional Radiology S/P bilateral common carotid arteriograms  followed  by complete revascularization of occluded LT MCA ,Lt ACA and Lt ICA terminus with x1 PASS WITH A COMBINATION OF solitaire 4mm x 40 mm retrieval device with simultaneous aspiration at the intermediate guide catheter and flow gate guide catheter, and 3.6 mg of superselective intracranial  INTEGRELIN achieving a TICI 3 reperfusion  SUBJECTIVE (INTERVAL HISTORY) Her RN is at the bedside.  Overall she feels her condition is rapidly improving. She is awake alert to person only. Follows simple commands. Moving all extremities equally.   OBJECTIVE Temp:  [97.9 F (36.6 C)-99.1 F (37.3 C)] 97.9 F (36.6 C) (11/25 0400) Pulse Rate:  [37-111] 92 (11/25 0800) Cardiac Rhythm: Normal sinus rhythm (11/25 0800) Resp:  [16-29] 19 (11/25 0800) BP: (88-158)/(35-122) 132/67 (11/25 0800) SpO2:  [92 %-100 %] 97 % (11/25 0800) Arterial Line BP: (140-153)/(73-83) 140/73 (11/24 0930) FiO2 (%):  [40 %] 40 % (11/24 1230)  CBC:  Recent Labs Lab 04/09/16 1827  04/10/16 0636 04/11/16 0530  WBC 12.9*  --  16.6* 15.1*  NEUTROABS 11.7*  --  14.1*  --   HGB 14.5  < > 12.1 10.9*  HCT 44.1  < > 37.6 34.3*  MCV 83.8  --  84.1 84.9  PLT 168  --  142* 157  < > = values in this interval not displayed.  Basic Metabolic Panel:   Recent Labs Lab  04/10/16 0636 04/11/16 0530  NA 136 138  K 3.7 3.3*  CL 103 105  CO2 26 24  GLUCOSE 117* 93  BUN 22* 14  CREATININE 0.92 0.71  CALCIUM 7.5* 7.2*    Lipid Panel:     Component Value Date/Time   CHOL 159 04/10/2016 0637   TRIG 76 04/10/2016 0637   HDL 52 04/10/2016 0637   CHOLHDL 3.1 04/10/2016 0637   VLDL 15 04/10/2016 0637   LDLCALC 92 04/10/2016 0637   HgbA1c:  Lab Results  Component Value Date   HGBA1C 5.8 (H) 04/10/2016   Urine Drug Screen: No results found for: LABOPIA, COCAINSCRNUR, LABBENZ, AMPHETMU, THCU, LABBARB    IMAGING I have personally reviewed the radiological images below and agree with the radiology interpretations.  Ct Head Wo Contrast 04/09/2016 No large territory acute infarct or intracranial hemorrhage identified. Patchy foci of hypoattenuation in periventricular and subcortical white matter may represent areas of infarction or chronic microvascular ischemic changes and are stable. If clinically indicated MRI is more sensitive for acute ischemia.   Dg Chest Port 1 View 04/10/2016 1. Endotracheal tube seen ending 2 cm above the carina.  2. Right IJ line noted ending about the distal SVC.  3. Enteric tube noted extending below the diaphragm.  4. Mild bibasilar atelectasis.   Ct Head Code Stroke Wo Contrast` 04/09/2016 No acute finding.  ASPECTS is 10.   Cerebral  angio 04/09/2016 S/P bilateral common carotid arteriograms  followed  by complete revascularization of occluded LT MCA ,Lt ACA and Lt ICA terminus with x1 PASS WITH A COMBINATION OF solitaire 4mm x 40 mm retrieval device with simultaneous aspiration at the intermediate guide catheter and flow gate guide catheter, and 3.6 mg of superselective intracranial  INTEGRELIN achieving a TICI 3 reperfusion   MRI Head / MRA Brain 04/10/2016 1. Small volume of scattered acute infarcts in the left hemisphere primarily in the left temporal and occipital lobes  (note fetal type left PCA origin  anatomy). 2. Petechial hemorrhage in the mesial left temporal lobe tracking to the cauda thalamic groove.   No associated mass effect and no malignant hemorrhagic transformation. 3. A small number of other scattered small infarcts in the bilateral MCA and left cerebellar artery territories might be  related to endovascular intervention. 4. Motion degraded intracranial MRA negative for emergent large vessel occlusion or proximal intracranial stenosis.   Possible occlusion of the left PCA P3 inferior division.   TTE pending    PHYSICAL EXAM  Temp:  [97.9 F (36.6 C)-99.1 F (37.3 C)] 97.9 F (36.6 C) (11/25 0400) Pulse Rate:  [37-111] 92 (11/25 0800) Resp:  [16-29] 19 (11/25 0800) BP: (88-158)/(35-122) 132/67 (11/25 0800) SpO2:  [92 %-100 %] 97 % (11/25 0800) Arterial Line BP: (140-153)/(73-83) 140/73 (11/24 0930) FiO2 (%):  [40 %] 40 % (11/24 1230)  General - Well nourished, well developed, intubated on low dose propofol.  Ophthalmologic - Fundi not visualized due to lethargy.  Cardiovascular - Regular rate and rhythm.  Neuro - Extubated, alert, oriented only to person, says it is January 1960 and she is at a hotel, follows all simple midline and peripheral commands. Eyes in mid position, able to attend both sides, PERRL, impaired upgaze otherwise EOMI, facial symmetric. Moves all extremities symmetrically 4/5 proximal and distally.DTR 1+ bilaterally and no babinski. Sensation symmetrical although limited testing due impaired cognition. Slow FTN but symmetrical. Gait not tested.    ASSESSMENT/PLAN Catherine Day is a 82 y.o. female with history of COPD and hypertension presenting with sudden onset of right sided weakness and inability to speak.   She received IV t-PA and underwent mechanical thrombectomy with complete revascularization of occluded LT MCA ,Lt ACA and Lt ICA terminus.  Stroke:  Dominant left brain infarct s/p tPA and revascularization of occluded LT MCA ,Lt ACA  and Lt ICA terminus, embolic pattern secondary to likely cardioembolic source.  Resultant  Rapid neuro improvement  MRI - Small volume of scattered acute infarcts in the left hemisphere. petechial hemorrhage in the left mesial temporal lobe   A small number of other scattered small infarcts in the bilateral MCA and left cerebellar artery territories   MRA - Possible occlusion of the left PCA P3 inferior division.  Cerebral angiogram - complete revascularization of occluded LT MCA ,Lt ACA and Lt ICA terminus  2D Echo - pending  TEE and loop recorder depending on further stroke work up   LDL - 92  HgbA1c - 5.8  VTE prophylaxis - SCDs    No antithrombotic prior to admission, now on Aspirin 325 mg daily will monitor  Ongoing aggressive stroke risk factor management  Therapy recommendations: pending   Disposition:  Pending  Hypertension  Stable  BP goal 120 - 160 post mechanical thrombectomy  Long-term BP goal normotensive  Hyperlipidemia  Home meds:  No lipid lowering medications prior to admission  LDL 92, goal < 70  Add Lipitor 20 mg daily when PO access available.  Continue statin at discharge  Other Stroke Risk Factors  Advanced age  Other Active Problems  Contrast extravasation left arm after IV infiltrated.  Leukocytosis - 16.6  -> 15.1 (afebrile)  Hypokalemia - 3.3  NPO  PLAN  Transfer to floor  Per nursing - pt failed swallowing evaluation. Speech Therapy to see.  Supplement potassium IV.  Start ASA suppository 300 mg daily.  Hospital day # 2  This patient is critically ill due to left terminal ICA, MCA and ACA occlusion s/p tPA and mechanical thrombectomy and at significant risk of neurological worsening, death form hemorrhagic transformation, recurrent stroke, brain herniation, seizure. This patient's care requires constant monitoring of vital signs, hemodynamics, respiratory and cardiac monitoring, review of multiple databases,  neurological assessment, discussion with family, other specialists and medical decision making of high complexity. I spent 30 minutes of neurocritical care time in the care of this patient.   Personally examined patient and images, and have participated in and made any corrections needed to history, physical, neuro exam,assessment and plan as stated above.  I have personally obtained the history, evaluated lab date, reviewed imaging studies and agree with radiology interpretations.    Naomie DeanAntonia Ahern, MD Stroke Neurology    To contact Stroke Continuity provider, please refer to [email or web address]. After hours, contact General Neurology

## 2016-04-11 NOTE — Evaluation (Signed)
Clinical/Bedside Swallow Evaluation Patient Details  Name: Hughie ClossRegina Hardenbrook MRN: [id number] Date of Birth: 04/09/1928  Today's Date: 04/11/2016 Time: SLP Start Time (ACUTE ONLY): 1139 SLP Stop Time (ACUTE ONLY): 1207 SLP Time Calculation (min) (ACUTE ONLY): 28 min  Past Medical History:  Past Medical History:  Diagnosis Date  . COPD (chronic obstructive pulmonary disease) (HCC)    Past Surgical History:  Past Surgical History:  Procedure Laterality Date  . PELVIC FRACTURE SURGERY    . RADIOLOGY WITH ANESTHESIA N/A 04/09/2016   Procedure: RADIOLOGY WITH ANESTHESIA;  Surgeon: Julieanne CottonSanjeev Deveshwar, MD;  Location: [facility] OR;  Service: Radiology;  Laterality: N/A;   HPI:  Ms [name] is an 82 yo female with h/o COPD admitted with mental status change, right sided weakness, hemianopsia, left gaze preference,  Pt required intubation and was extubated yesterday.  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  Swallow and speech eval ordered   Assessment / Plan / Recommendation Clinical Impression  Pt presents with symptoms concerning for possible dysphagia/difficulty managing secretions.  Congested breathing quality and hoarseness noted at baseline which is concerning for airway protection.   Pt's cough was weak and not productive despite max cues.  As pt with multiple CVAs concern for silent aspiration present and given recent intubation/COPD, recommend NPO except medicine with puree and ice chips.  Will follow up next date for clinical reevaluation/po readiness.  Pt educated to plan but cognitive linguistic deficits noted and uncertain to adequacy of comprehension.      Aspiration Risk  Moderate aspiration risk    Diet Recommendation NPO;NPO except meds;Ice chips PRN after oral care   Medication Administration: Crushed with puree    Other  Recommendations Oral Care Recommendations: Oral care BID   Follow up  Recommendations Inpatient Rehab      Frequency and Duration            Prognosis        Swallow Study   General Date of Onset: 04/09/16 HPI: Ms [name] is an 82 yo female with h/o COPD admitted with mental status change, right sided weakness, hemianopsia, left gaze preference,  Pt required intubation and was extubated yesterday.  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  Swallow and speech eval ordered Type of Study: Bedside Swallow Evaluation Diet Prior to this Study: NPO Temperature Spikes Noted: Yes (low grade) Respiratory Status: Nasal cannula Behavior/Cognition: Alert;Confused Oral Cavity Assessment: Within Functional Limits Oral Care Completed by SLP: Recent completion by staff Oral Cavity - Dentition: Adequate natural dentition (poor condition) Vision: Functional for self-feeding Self-Feeding Abilities: Total assist Patient Positioning: Upright in bed Baseline Vocal Quality: Low vocal intensity;Hoarse Volitional Cough: Weak Volitional Swallow: Unable to elicit    Oral/Motor/Sensory Function Overall Oral Motor/Sensory Function: Generalized oral weakness (? right facial asymmetry, lingual deviation slight to right ) Mandible:  (sluggish)   Ice Chips Ice chips: Impaired Presentation: Spoon Oral Phase Impairments: Reduced lingual movement/coordination;Impaired mastication Oral Phase Functional Implications: Prolonged oral transit Pharyngeal Phase Impairments: Throat Clearing - Immediate   Thin Liquid Thin Liquid: Impaired Presentation: Cup Pharyngeal  Phase Impairments: Cough - Immediate    Nectar Thick Nectar Thick Liquid: Within functional limits Presentation: Cup;Self Fed   Honey Thick Honey Thick Liquid: Not tested   Puree Puree: Within functional limits Presentation: Spoon   Solid   GO   Solid: Not tested  Mickie BailKimball, Tyjah Hai Ann Rishab Stoudt [place], [place] [facility]CCC  SLP [phone]

## 2016-04-11 NOTE — Progress Notes (Signed)
Patient ID: Catherine Day, female   DOB: 05/04/1928, 82 y.o.   MRN: [id number]    Referring Physician(s): Dr. Marvel Plan  Supervising Physician: Julieanne Cotton  Patient Status: [name] - In-pt  Chief Complaint: CVA  Subjective: Patient awake and follows commands.  No complaints.  Allergies: Patient has no known allergies.  Medications: Prior to Admission medications   Medication Sig Start Date End Date Taking? Authorizing Provider  Calcium Citrate-Vitamin D (CITRACAL + D PO) Take 1 tablet by mouth daily.   Yes Historical Provider, MD  denosumab (PROLIA) 60 MG/ML SOLN injection Inject 60 mg into the skin every 6 (six) months. Administer in upper arm, thigh, or abdomen Last injection 03/24/16   Yes Historical Provider, MD  fluticasone furoate-vilanterol (BREO ELLIPTA) 100-25 MCG/INH AEPB Inhale 1 puff into the lungs daily.   Yes Historical Provider, MD  ipratropium-albuterol (DUONEB) 0.5-2.5 (3) MG/3ML SOLN Take 3 mLs by nebulization See admin instructions. Inhale 1 vial by nebulization every morning and may also use 2 more times during the day as needed for shortness of breath or wheezing   Yes Historical Provider, MD  levalbuterol (XOPENEX HFA) 45 MCG/ACT inhaler Inhale 1-2 puffs into the lungs every 4 (four) hours as needed for wheezing or shortness of breath.   Yes Historical Provider, MD  methylPREDNISolone (MEDROL DOSEPAK) 4 MG TBPK tablet Take 4-8 mg by mouth See admin instructions. Tapered course started 04/09/16 am -Day 1: take 2 tablets (8 mg) by mouth with breakfast, 1 tablet (4 mg) with lunch and supper and 2 tablets at bedtime; Day 2: take 1 tablet with breakfast, lunch and supper and 2 tablets at bedtime; Day 3: take 1 tablet with breakfast, lunch, supper and at bedtime; Day 4: take 1 tablet with breakfast, lunch and at bedtime; Day 5: take 1 tablet with breakfast and at bedtime; Day 6: take 1 tablet with breakfast 04/06/16  Yes Historical Provider, MD  OXYGEN Inhale 2 L into the  lungs continuous.   Yes Historical Provider, MD  tiotropium (SPIRIVA) 18 MCG inhalation capsule Place 18 mcg into inhaler and inhale daily.   Yes Historical Provider, MD  tobramycin, PF, (TOBI) 300 MG/5ML nebulizer solution Take 300 mg by nebulization daily.   Yes Historical Provider, MD  valsartan-hydrochlorothiazide (DIOVAN-HCT) 80-12.5 MG tablet Take 1 tablet by mouth daily.   Yes Historical Provider, MD    Vital Signs: BP 131/65   Pulse 73   Temp 98.9 F (37.2 C) (Axillary)   Resp 19   Ht 5\' 6"  (1.676 m)   Wt 170 lb 10.2 oz (77.4 kg)   SpO2 100%   BMI 27.54 kg/m   Physical Exam: Gen: NAD Skin: bilateral inguinal site are c/d/i with no evidence of hematoma or bleeding. Neuro: NVI, equal strength bilaterally.  She is alert and follows commands.  +2 pedal pulses  Imaging: Ct Head Wo Contrast  Result Date: 04/09/2016 CLINICAL DATA:  82 y/o F; code stroke post transarterial intervention. EXAM: CT HEAD WITHOUT CONTRAST TECHNIQUE: Contiguous axial images were obtained from the base of the skull through the vertex without intravenous contrast. COMPARISON:  04/09/2016 CT head. FINDINGS: Brain: No large territory acute infarct or intracranial hemorrhage identified. Patchy foci of hypoattenuation in periventricular and subcortical white matter may represent areas of infarction or chronic microvascular ischemic changes and are stable. Mild parenchymal volume loss. No focal mass effect. No extra-axial collection. No effacement of basilar cisterns. Vascular: Persistent opacification from previous contrast administration. Calcific atherosclerosis of carotid siphons. Skull: Normal.  Negative for fracture or focal lesion. Sinuses/Orbits: Partial opacification of ethmoid and sphenoid sinuses with aerosolized secretions and partial opacification of mastoid air cells probably related to intubation. Orbits are unremarkable. Other: None. IMPRESSION: No large territory acute infarct or intracranial hemorrhage  identified. Patchy foci of hypoattenuation in periventricular and subcortical white matter may represent areas of infarction or chronic microvascular ischemic changes and are stable. If clinically indicated MRI is more sensitive for acute ischemia. Electronically Signed   By: Mitzi Hansen M.D.   On: 04/09/2016 22:51   Mr Shirlee Latch ZO Contrast  Result Date: 04/10/2016 CLINICAL DATA:  82 year old female status post neuro intervention on 04/09/2016 for acute onset right side weakness and inability to speak. Status post Bilateral cerebral angiogram with Revascularization of occluded LT MCA ,Lt ACA and Lt ICA terminus with x1 PASS WITH A COMBINATION OF solitaire 4mm x 40 mm retrieval device with simultaneous aspiration at the intermediate guide catheter and flow gate guide catheter, and 3.6 mg of superselective intracranial INTEGRELIN. Initial encounter. EXAM: MRI HEAD WITHOUT CONTRAST MRA HEAD WITHOUT CONTRAST TECHNIQUE: Multiplanar, multiecho pulse sequences of the brain and surrounding structures were obtained without intravenous contrast. Angiographic images of the head were obtained using MRA technique without contrast. COMPARISON:  Post tPA head CT 04/09/2016 FINDINGS: MRI HEAD FINDINGS Brain: Multiple small cortically based and white-matter foci of restricted diffusion in the left temporal lobe, best seen on axial diffusion weighted imaging (series 4, image 20). Evidence of petechial hemorrhage in the left mesial temporal lobe which appears to track to the left cauda thalamic groove where diffusion is also heterogeneous. These areas demonstrate mild T2 and FLAIR hyperintensity without mass effect. In addition there are several other widely scattered mostly cortically based foci of restricted diffusion in both MCA territories at over the superior convexities. Similar scattered small infarcts in the left occipital lobe. One or 2 such areas in the left cerebellum. No other intracranial hemorrhage. No  intracranial mass effect or ventriculomegaly. Superimposed patchy bilateral cerebral white matter T2 and FLAIR hyperintensity. No chronic cortical encephalomalacia. Right deep gray matter nuclei and brainstem are normal for age. Negative pituitary and cervicomedullary junction. Vascular: Major intracranial vascular flow voids are preserved. Skull and upper cervical spine: Negative. Normal bone marrow signal. Sinuses/Orbits: Normal orbits soft tissues. Trace paranasal sinus mucosal thickening. Mild mastoid effusions. Other: Negative scalp soft tissues. MRA HEAD FINDINGS Mildly motion degraded. Antegrade flow in the posterior circulation with mildly dominant distal right vertebral artery. Normal PICA origins. Patent vertebrobasilar junction. Patent basilar artery without stenosis. Patent SCA and normal right PCA origins. Fetal type left PCA origin. Right posterior communicating artery diminutive or absent. Absent flow signal in the inferior left PCA P3 division. Otherwise the bilateral PCA branches appear normal. Antegrade flow in both ICA siphons with some generalized ICA dolichoectasia and tortuosity of the distal cervical right ICA. Patent carotid termini. Normal MCA and ACA origins. Generalized ACA dolichoectasia. Proximal ACA branches appear normal. Both MCA bifurcations appear patent. MCA branch detail is degraded by motion. Fairly symmetric appearance of bilateral MCA branch flow signal. IMPRESSION: 1. Small volume of scattered acute infarcts in the left hemisphere primarily in the left temporal and occipital lobes (note fetal type left PCA origin anatomy). 2. Petechial hemorrhage in the mesial left temporal lobe tracking to the cauda thalamic groove. No associated mass effect and no malignant hemorrhagic transformation. 3. A small number of other scattered small infarcts in the bilateral MCA and left cerebellar artery territories might be related  to endovascular intervention. 4. Motion degraded intracranial  MRA negative for emergent large vessel occlusion or proximal intracranial stenosis. Possible occlusion of the left PCA P3 inferior division. Electronically Signed   By: Odessa FlemingH  Hall M.D.   On: 04/10/2016 19:16   Mr Brain Wo Contrast  Result Date: 04/10/2016 CLINICAL DATA:  82 year old female status post neuro intervention on 04/09/2016 for acute onset right side weakness and inability to speak. Status post Bilateral cerebral angiogram with Revascularization of occluded LT MCA ,Lt ACA and Lt ICA terminus with x1 PASS WITH A COMBINATION OF solitaire 4mm x 40 mm retrieval device with simultaneous aspiration at the intermediate guide catheter and flow gate guide catheter, and 3.6 mg of superselective intracranial INTEGRELIN. Initial encounter. EXAM: MRI HEAD WITHOUT CONTRAST MRA HEAD WITHOUT CONTRAST TECHNIQUE: Multiplanar, multiecho pulse sequences of the brain and surrounding structures were obtained without intravenous contrast. Angiographic images of the head were obtained using MRA technique without contrast. COMPARISON:  Post tPA head CT 04/09/2016 FINDINGS: MRI HEAD FINDINGS Brain: Multiple small cortically based and white-matter foci of restricted diffusion in the left temporal lobe, best seen on axial diffusion weighted imaging (series 4, image 20). Evidence of petechial hemorrhage in the left mesial temporal lobe which appears to track to the left cauda thalamic groove where diffusion is also heterogeneous. These areas demonstrate mild T2 and FLAIR hyperintensity without mass effect. In addition there are several other widely scattered mostly cortically based foci of restricted diffusion in both MCA territories at over the superior convexities. Similar scattered small infarcts in the left occipital lobe. One or 2 such areas in the left cerebellum. No other intracranial hemorrhage. No intracranial mass effect or ventriculomegaly. Superimposed patchy bilateral cerebral white matter T2 and FLAIR hyperintensity.  No chronic cortical encephalomalacia. Right deep gray matter nuclei and brainstem are normal for age. Negative pituitary and cervicomedullary junction. Vascular: Major intracranial vascular flow voids are preserved. Skull and upper cervical spine: Negative. Normal bone marrow signal. Sinuses/Orbits: Normal orbits soft tissues. Trace paranasal sinus mucosal thickening. Mild mastoid effusions. Other: Negative scalp soft tissues. MRA HEAD FINDINGS Mildly motion degraded. Antegrade flow in the posterior circulation with mildly dominant distal right vertebral artery. Normal PICA origins. Patent vertebrobasilar junction. Patent basilar artery without stenosis. Patent SCA and normal right PCA origins. Fetal type left PCA origin. Right posterior communicating artery diminutive or absent. Absent flow signal in the inferior left PCA P3 division. Otherwise the bilateral PCA branches appear normal. Antegrade flow in both ICA siphons with some generalized ICA dolichoectasia and tortuosity of the distal cervical right ICA. Patent carotid termini. Normal MCA and ACA origins. Generalized ACA dolichoectasia. Proximal ACA branches appear normal. Both MCA bifurcations appear patent. MCA branch detail is degraded by motion. Fairly symmetric appearance of bilateral MCA branch flow signal. IMPRESSION: 1. Small volume of scattered acute infarcts in the left hemisphere primarily in the left temporal and occipital lobes (note fetal type left PCA origin anatomy). 2. Petechial hemorrhage in the mesial left temporal lobe tracking to the cauda thalamic groove. No associated mass effect and no malignant hemorrhagic transformation. 3. A small number of other scattered small infarcts in the bilateral MCA and left cerebellar artery territories might be related to endovascular intervention. 4. Motion degraded intracranial MRA negative for emergent large vessel occlusion or proximal intracranial stenosis. Possible occlusion of the left PCA P3  inferior division. Electronically Signed   By: Odessa FlemingH  Hall M.D.   On: 04/10/2016 19:16   [facility]  Result Date: 04/11/2016 CLINICAL DATA:  82 year old female with respiratory failure EXAM: PORTABLE CHEST 1 VIEW COMPARISON:  Prior chest x-ray 04/09/2016 FINDINGS: The patient has been extubated and the nasogastric tube removed. Inspiratory volumes are lower. Nonspecific patchy opacities in the right greater than left base. Stable cardiomegaly. Mild pulmonary vascular congestion without edema. Atherosclerotic, ectatic and tortuous thoracic aorta. Right IJ approach central venous catheter remains in unchanged position. The tip of the catheter is at the superior cavoatrial junction. Osseous structures are intact and unremarkable. IMPRESSION: 1. Interval extubation and removal of nasogastric tube. Right IJ central venous catheter remains in stable and satisfactory position. 2. Lower inspiratory volumes with increased bibasilar atelectasis and pulmonary vascular congestion. 3.  Aortic Atherosclerosis (ICD10-170.0) Electronically Signed   By: Malachy Moan M.D.   On: 04/11/2016 08:12   Dg Chest Port 1 View  Result Date: 04/10/2016 CLINICAL DATA:  Endotracheal tube and orogastric tube placement. Initial encounter. EXAM: PORTABLE CHEST 1 VIEW COMPARISON:  None. FINDINGS: The patient's endotracheal tube is seen ending 2 cm above the carina. A right IJ line is noted ending about the distal SVC. An enteric tube is noted extending below the diaphragm. Mild bibasilar atelectasis is noted. No pleural effusion or pneumothorax is seen. The cardiomediastinal silhouette is borderline normal in size. No acute osseous abnormalities are identified. IMPRESSION: 1. Endotracheal tube seen ending 2 cm above the carina. 2. Right IJ line noted ending about the distal SVC. 3. Enteric tube noted extending below the diaphragm. 4. Mild bibasilar atelectasis. Electronically Signed   By: Roanna Raider M.D.   On: 04/10/2016  00:02   Dg Abd Portable 1v  Result Date: 04/09/2016 CLINICAL DATA:  82 y/o  F; OG tube placement. EXAM: PORTABLE ABDOMEN - 1 VIEW COMPARISON:  None. FINDINGS: Patient is rotated. Normal bowel gas pattern. Enteric tube tip is below the diaphragm probably in the distal stomach. Slight densities projecting over the kidneys bilaterally may represent stones. Degenerative changes of the spine. IMPRESSION: Enteric tube tip probably in distal stomach. Densities projecting over kidneys may represent nephrolithiasis. Electronically Signed   By: Mitzi Hansen M.D.   On: 04/09/2016 23:56   Ct Head Code Stroke Wo Contrast`  Result Date: 04/09/2016 CLINICAL DATA:  Code stroke for right-sided weakness and loss of speech. EXAM: CT HEAD WITHOUT CONTRAST TECHNIQUE: Contiguous axial images were obtained from the base of the skull through the vertex without intravenous contrast. COMPARISON:  None. FINDINGS: Brain: No evidence of acute infarction, hemorrhage, hydrocephalus, extra-axial collection or mass lesion/mass effect. Vascular: Atherosclerotic calcification.  No hyperdense vessel. Skull: Negative Sinuses/Orbits: Mild chronic sinusitis and left mastoiditis. Other: Results were called by telephone at the time of interpretation on 04/09/2016 at 6:41 pm to Dr. Benedict Needy, who verbally acknowledged these results. ASPECTS [facility] Stroke Program Early CT Score) - Ganglionic level infarction (caudate, lentiform nuclei, internal capsule, insula, M1-M3 cortex): 7 - Supraganglionic infarction (M4-M6 cortex): 3 Total score (0-10 with 10 being normal): 10 IMPRESSION: No acute finding. ASPECTS is 10. Electronically Signed   By: Marnee Spring M.D.   On: 04/09/2016 18:43    Labs:  CBC:  Recent Labs  04/09/16 1827 04/09/16 1834 04/10/16 0636 04/11/16 0530  WBC 12.9*  --  16.6* 15.1*  HGB 14.5 15.6* 12.1 10.9*  HCT 44.1 46.0 37.6 34.3*  PLT 168  --  142* 157    COAGS:  Recent Labs  04/09/16 1827  INR 1.14    APTT 25    BMP:  Recent Labs  04/09/16 1827 04/09/16 1834 04/10/16 0636 04/11/16 0530  NA 131* 134* 136 138  K 3.8 4.1 3.7 3.3*  CL 99* 99* 103 105  CO2 23  --  26 24  GLUCOSE 231* 233* 117* 93  BUN 23* 31* 22* 14  CALCIUM 8.5*  --  7.5* 7.2*  CREATININE 0.91 0.90 0.92 0.71  GFRNONAA 56*  --  55* >60  GFRAA >60  --  >60 >60    LIVER FUNCTION TESTS:  Recent Labs  04/09/16 1827  BILITOT 0.7  AST 22  ALT 17  ALKPHOS 61  PROT 7.5  ALBUMIN 3.2*    Assessment and Plan: 1. S/P bilateral common carotid arteriograms followed by complete revascularization of occluded LT MCA ,Lt ACA and Lt ICA terminus with x1 PASS WITH A COMBINATION OF solitaire 4mm x 40 mm retrieval device with simultaneous aspiration at the intermediate guide catheter and flow gate guide catheter, and 3.6 mg of superselective intracranial INTEGRELIN achieving a TICI 3 reperfusion, 11/23 - Deveshwar -patient doing well.  She is following commands and is neuro intact.  Her sites are c/d/i -she will follow up with Dr. Corliss Skainseveshwar in 2-4 weeks s/p discharge. -further plans for neuro. -please call if questions.  Electronically Signed: Letha CapeSBORNE,Savannah Erbe E 04/11/2016, 10:28 AM   I spent a total of 15 Minutes at the the patient's bedside AND on the patient's hospital floor or unit, greater than 50% of which was counseling/coordinating care for CVA

## 2016-04-11 NOTE — Progress Notes (Signed)
Inpatient Rehabilitation  Per PT request patient was screened by Ceci Taliaferro for appropriateness for an Inpatient Acute Rehab consult.  At this time we are recommending an Inpatient Rehab consult.  Please order if you are agreeable.    Chalet Kerwin, M.A., CCC/SLP Admission Coordinator  [facility]  Cell [phone]  

## 2016-04-11 NOTE — Progress Notes (Signed)
PULMONARY / CRITICAL CARE MEDICINE   Name: Catherine Day MRN: [id number] DOB: 1927/11/07    ADMISSION DATE:  04/09/2016 CONSULTATION DATE:  04/09/2016  REFERRING MD:  Dr. Amada Jupiter  CHIEF COMPLAINT:  CVA  HISTORY OF PRESENT ILLNESS:   82 year old female with PMH COPD, HTN,  presented to [facility] ED with sudden onset R sided-weakness and aphasia. EMS was immediately called. Code stroke initiated in ED and CT head without acute findings. tPa was administered in ED and he was taken to IR for neuro intervention. Where she underwent revascularization of occluded Lt MCA, ACA and ICA terminus. She was electively intubated for the procedure and sent to ICU for recovery and frequent assessment. PCCM to consult for vent management.   SUBJECTIVE:  Awake and alert. Extubated 11/24.   VITAL SIGNS: BP (!) 165/135   Pulse 77   Temp 98.9 F (37.2 C) (Axillary)   Resp 18   Ht 5\' 6"  (1.676 m)   Wt 77.4 kg (170 lb 10.2 oz)   SpO2 99%   BMI 27.54 kg/m   HEMODYNAMICS:    VENTILATOR SETTINGS: Vent Mode: CPAP;PSV FiO2 (%):  [40 %] 40 % PEEP:  [5 cmH20] 5 cmH20 Pressure Support:  [5 cmH20] 5 cmH20  INTAKE / OUTPUT: I/O last 3 completed shifts: In: 3729.8 [I.V.:3729.8] Out: 1465 [Urine:1390; Blood:75]  PHYSICAL EXAMINATION: General:  Elderly female in NAD Neuro:  Awake, alert, participating in PT  HEENT:  [place]/AT, PERRL Cardiovascular:  RRR, no MRG Lungs:  resps even non labored on [place], deceased in bases Abdomen:  Soft, non-distended Musculoskeletal:   Skin:  (-) edema. Warm, dry  LABS:  BMET  Recent Labs Lab 04/09/16 1827 04/09/16 1834 04/10/16 0636 04/11/16 0530  NA 131* 134* 136 138  K 3.8 4.1 3.7 3.3*  CL 99* 99* 103 105  CO2 23  --  26 24  BUN 23* 31* 22* 14  CREATININE 0.91 0.90 0.92 0.71  GLUCOSE 231* 233* 117* 93    Electrolytes  Recent Labs Lab 04/09/16 1827 04/10/16 0636 04/11/16 0530  CALCIUM 8.5* 7.5* 7.2*    CBC  Recent Labs Lab 04/09/16 1827  04/09/16 1834 04/10/16 0636 04/11/16 0530  WBC 12.9*  --  16.6* 15.1*  HGB 14.5 15.6* 12.1 10.9*  HCT 44.1 46.0 37.6 34.3*  PLT 168  --  142* 157    Coag's  Recent Labs Lab 04/09/16 1827  APTT 25  INR 1.14    Sepsis Markers No results for input(s): LATICACIDVEN, PROCALCITON, O2SATVEN in the last 168 hours.  ABG  Recent Labs Lab 04/09/16 0255  PHART 7.403  PCO2ART 35.1  PO2ART 87.7    Liver Enzymes  Recent Labs Lab 04/09/16 1827  AST 22  ALT 17  ALKPHOS 61  BILITOT 0.7  ALBUMIN 3.2*    Cardiac Enzymes No results for input(s): TROPONINI, PROBNP in the last 168 hours.  Glucose  Recent Labs Lab 04/09/16 1825 04/09/16 2259  GLUCAP 224* 126*    Imaging Mr Shirlee Latch Wo Contrast  Result Date: 04/10/2016 CLINICAL DATA:  82 year old female status post neuro intervention on 04/09/2016 for acute onset right side weakness and inability to speak. Status post Bilateral cerebral angiogram with Revascularization of occluded LT MCA ,Lt ACA and Lt ICA terminus with x1 PASS WITH A COMBINATION OF solitaire 4mm x 40 mm retrieval device with simultaneous aspiration at the intermediate guide catheter and flow gate guide catheter, and 3.6 mg of superselective intracranial INTEGRELIN. Initial encounter. EXAM: MRI  HEAD WITHOUT CONTRAST MRA HEAD WITHOUT CONTRAST TECHNIQUE: Multiplanar, multiecho pulse sequences of the brain and surrounding structures were obtained without intravenous contrast. Angiographic images of the head were obtained using MRA technique without contrast. COMPARISON:  Post tPA head CT 04/09/2016 FINDINGS: MRI HEAD FINDINGS Brain: Multiple small cortically based and white-matter foci of restricted diffusion in the left temporal lobe, best seen on axial diffusion weighted imaging (series 4, image 20). Evidence of petechial hemorrhage in the left mesial temporal lobe which appears to track to the left cauda thalamic groove where diffusion is also heterogeneous. These  areas demonstrate mild T2 and FLAIR hyperintensity without mass effect. In addition there are several other widely scattered mostly cortically based foci of restricted diffusion in both MCA territories at over the superior convexities. Similar scattered small infarcts in the left occipital lobe. One or 2 such areas in the left cerebellum. No other intracranial hemorrhage. No intracranial mass effect or ventriculomegaly. Superimposed patchy bilateral cerebral white matter T2 and FLAIR hyperintensity. No chronic cortical encephalomalacia. Right deep gray matter nuclei and brainstem are normal for age. Negative pituitary and cervicomedullary junction. Vascular: Major intracranial vascular flow voids are preserved. Skull and upper cervical spine: Negative. Normal bone marrow signal. Sinuses/Orbits: Normal orbits soft tissues. Trace paranasal sinus mucosal thickening. Mild mastoid effusions. Other: Negative scalp soft tissues. MRA HEAD FINDINGS Mildly motion degraded. Antegrade flow in the posterior circulation with mildly dominant distal right vertebral artery. Normal PICA origins. Patent vertebrobasilar junction. Patent basilar artery without stenosis. Patent SCA and normal right PCA origins. Fetal type left PCA origin. Right posterior communicating artery diminutive or absent. Absent flow signal in the inferior left PCA P3 division. Otherwise the bilateral PCA branches appear normal. Antegrade flow in both ICA siphons with some generalized ICA dolichoectasia and tortuosity of the distal cervical right ICA. Patent carotid termini. Normal MCA and ACA origins. Generalized ACA dolichoectasia. Proximal ACA branches appear normal. Both MCA bifurcations appear patent. MCA branch detail is degraded by motion. Fairly symmetric appearance of bilateral MCA branch flow signal. IMPRESSION: 1. Small volume of scattered acute infarcts in the left hemisphere primarily in the left temporal and occipital lobes (note fetal type left PCA  origin anatomy). 2. Petechial hemorrhage in the mesial left temporal lobe tracking to the cauda thalamic groove. No associated mass effect and no malignant hemorrhagic transformation. 3. A small number of other scattered small infarcts in the bilateral MCA and left cerebellar artery territories might be related to endovascular intervention. 4. Motion degraded intracranial MRA negative for emergent large vessel occlusion or proximal intracranial stenosis. Possible occlusion of the left PCA P3 inferior division. Electronically Signed   By: Odessa FlemingH  Hall M.D.   On: 04/10/2016 19:16   Mr Brain Wo Contrast  Result Date: 04/10/2016 CLINICAL DATA:  82 year old female status post neuro intervention on 04/09/2016 for acute onset right side weakness and inability to speak. Status post Bilateral cerebral angiogram with Revascularization of occluded LT MCA ,Lt ACA and Lt ICA terminus with x1 PASS WITH A COMBINATION OF solitaire 4mm x 40 mm retrieval device with simultaneous aspiration at the intermediate guide catheter and flow gate guide catheter, and 3.6 mg of superselective intracranial INTEGRELIN. Initial encounter. EXAM: MRI HEAD WITHOUT CONTRAST MRA HEAD WITHOUT CONTRAST TECHNIQUE: Multiplanar, multiecho pulse sequences of the brain and surrounding structures were obtained without intravenous contrast. Angiographic images of the head were obtained using MRA technique without contrast. COMPARISON:  Post tPA head CT 04/09/2016 FINDINGS: MRI HEAD FINDINGS Brain: Multiple small cortically  based and white-matter foci of restricted diffusion in the left temporal lobe, best seen on axial diffusion weighted imaging (series 4, image 20). Evidence of petechial hemorrhage in the left mesial temporal lobe which appears to track to the left cauda thalamic groove where diffusion is also heterogeneous. These areas demonstrate mild T2 and FLAIR hyperintensity without mass effect. In addition there are several other widely scattered mostly  cortically based foci of restricted diffusion in both MCA territories at over the superior convexities. Similar scattered small infarcts in the left occipital lobe. One or 2 such areas in the left cerebellum. No other intracranial hemorrhage. No intracranial mass effect or ventriculomegaly. Superimposed patchy bilateral cerebral white matter T2 and FLAIR hyperintensity. No chronic cortical encephalomalacia. Right deep gray matter nuclei and brainstem are normal for age. Negative pituitary and cervicomedullary junction. Vascular: Major intracranial vascular flow voids are preserved. Skull and upper cervical spine: Negative. Normal bone marrow signal. Sinuses/Orbits: Normal orbits soft tissues. Trace paranasal sinus mucosal thickening. Mild mastoid effusions. Other: Negative scalp soft tissues. MRA HEAD FINDINGS Mildly motion degraded. Antegrade flow in the posterior circulation with mildly dominant distal right vertebral artery. Normal PICA origins. Patent vertebrobasilar junction. Patent basilar artery without stenosis. Patent SCA and normal right PCA origins. Fetal type left PCA origin. Right posterior communicating artery diminutive or absent. Absent flow signal in the inferior left PCA P3 division. Otherwise the bilateral PCA branches appear normal. Antegrade flow in both ICA siphons with some generalized ICA dolichoectasia and tortuosity of the distal cervical right ICA. Patent carotid termini. Normal MCA and ACA origins. Generalized ACA dolichoectasia. Proximal ACA branches appear normal. Both MCA bifurcations appear patent. MCA branch detail is degraded by motion. Fairly symmetric appearance of bilateral MCA branch flow signal. IMPRESSION: 1. Small volume of scattered acute infarcts in the left hemisphere primarily in the left temporal and occipital lobes (note fetal type left PCA origin anatomy). 2. Petechial hemorrhage in the mesial left temporal lobe tracking to the cauda thalamic groove. No associated mass  effect and no malignant hemorrhagic transformation. 3. A small number of other scattered small infarcts in the bilateral MCA and left cerebellar artery territories might be related to endovascular intervention. 4. Motion degraded intracranial MRA negative for emergent large vessel occlusion or proximal intracranial stenosis. Possible occlusion of the left PCA P3 inferior division. Electronically Signed   By: Odessa Fleming M.D.   On: 04/10/2016 19:16   Dg Chest Port 1 View  Result Date: 04/11/2016 CLINICAL DATA:  82 year old female with respiratory failure EXAM: PORTABLE CHEST 1 VIEW COMPARISON:  Prior chest x-ray 04/09/2016 FINDINGS: The patient has been extubated and the nasogastric tube removed. Inspiratory volumes are lower. Nonspecific patchy opacities in the right greater than left base. Stable cardiomegaly. Mild pulmonary vascular congestion without edema. Atherosclerotic, ectatic and tortuous thoracic aorta. Right IJ approach central venous catheter remains in unchanged position. The tip of the catheter is at the superior cavoatrial junction. Osseous structures are intact and unremarkable. IMPRESSION: 1. Interval extubation and removal of nasogastric tube. Right IJ central venous catheter remains in stable and satisfactory position. 2. Lower inspiratory volumes with increased bibasilar atelectasis and pulmonary vascular congestion. 3.  Aortic Atherosclerosis (ICD10-170.0) Electronically Signed   By: Malachy Moan M.D.   On: 04/11/2016 08:12     STUDIES:  CT head 11/23 > no acute finding  CULTURES:   ANTIBIOTICS:   SIGNIFICANT EVENTS:   LINES/TUBES: ETT 11/23 >11/24 A line 11/23 >  DISCUSSION: 82 Y/O with PMH of  COPD, HTN admitted with stroke s/p TPA and revascularization of occluded Lt MCA, ACA and ICA terminus.   ASSESSMENT / PLAN:  PULMONARY A: VDRF in post-operative setting - resolved.  COPD without acute exacerbation  P:  Pulmonary hygiene  Mobilize  Continue breo,  spiriva   CARDIOVASCULAR A:  HTN  P:  SBP goal per neurology Echo pending  cardene gtt off    RENAL A:   Hyponatremia  P:   Maintenance IVF   GASTROINTESTINAL A:   No acute issues  P:   Diet per speech    HEMATOLOGIC A:   No acute issues  P:    INFECTIOUS A:   Leukocytosis  P:   perioperative ancef Observe for fever trend.   ENDOCRINE A:   No acute issues  P:   NPO, advance once awake CBG and SSI   NEUROLOGIC A:   Acute L MCA CVA, S/P tPA (11/23) S/P revascularization of occluded Lt MCA, ACA and ICA terminus. (11/23)  P:   Per neuro    FAMILY  - Updates: No family seen at bedside 11/25.    Tx to floor per neuro.   PCCM signing off, please call back if needed.    Dirk DressKaty Whiteheart, NP 04/11/2016  12:18 PM Pager: [phone] or [phone]) [phone]   ATTENDING NOTE / ATTESTATION NOTE :   I have discussed the case with the resident/APP  Dirk DressKaty Whiteheart NP  I agree with the resident/APP's  history, physical examination, assessment, and plans.    I have edited the above note and modified it according to our agreed history, physical examination, assessment and plan.   Briefly, patient admitted after sustaining a stroke. She received TPA and clot retrieval. Intubated for airway protection. Extubated yesterday. Doing well. Transferred to floors. [facility], PCCM off.    Family :  No family at bedside.    Pollie MeyerJ. Angelo A de Dios, MD 04/11/2016, 7:08 PM [facility] Pager [phone] After 3 pm or if no answer, call [phone]

## 2016-04-11 NOTE — Progress Notes (Signed)
Received from transferring floor via wc. Foley d/c'd. IVF 75ml hr. Bed alarm on, new intelivue camera on and family at bedside. Speech slurred, thinks it's April, doesn't know her age. Good grips, moves all ext's equally, pupils 2/brisk bilat.

## 2016-04-12 ENCOUNTER — Inpatient Hospital Stay (HOSPITAL_COMMUNITY): Payer: Medicare Other

## 2016-04-12 LAB — BASIC METABOLIC PANEL
Anion gap: 10 (ref 5–15)
BUN: 10 mg/dL (ref 6–20)
CHLORIDE: 108 mmol/L (ref 101–111)
CO2: 19 mmol/L — AB (ref 22–32)
CREATININE: 0.63 mg/dL (ref 0.44–1.00)
Calcium: 6.9 mg/dL — ABNORMAL LOW (ref 8.9–10.3)
GFR calc non Af Amer: >60 mL/min (ref >60)
Glucose, Bld: 69 mg/dL (ref 65–99)
POTASSIUM: 3.6 mmol/L (ref 3.5–5.1)
Sodium: 137 mmol/L (ref 135–145)

## 2016-04-12 LAB — CBC
HEMATOCRIT: 34.6 % — AB (ref 36.0–46.0)
HEMOGLOBIN: 10.8 g/dL — AB (ref 12.0–15.0)
MCH: 26.9 pg (ref 26.0–34.0)
MCHC: 31.2 g/dL (ref 30.0–36.0)
MCV: 86.1 fL (ref 78.0–100.0)
Platelets: 163 10*3/uL (ref 150–400)
RBC: 4.02 MIL/uL (ref 3.87–5.11)
RDW: 13.9 % (ref 11.5–15.5)
WBC: 10.3 10*3/uL (ref 4.0–10.5)

## 2016-04-12 LAB — IRON AND TIBC
Iron: 24 ug/dL — ABNORMAL LOW (ref 28–170)
SATURATION RATIOS: 11 % (ref 10.4–31.8)
TIBC: 228 ug/dL — AB (ref 250–450)
UIBC: 204 ug/dL

## 2016-04-12 MED ORDER — LEVALBUTEROL HCL 0.63 MG/3ML IN NEBU
0.6300 mg | INHALATION_SOLUTION | Freq: Three times a day (TID) | RESPIRATORY_TRACT | Status: DC
  Administered 2016-04-12 – 2016-04-14 (×5): 0.63 mg via RESPIRATORY_TRACT
  Filled 2016-04-12 (×5): qty 3

## 2016-04-12 MED ORDER — ATORVASTATIN CALCIUM 10 MG PO TABS
20.0000 mg | ORAL_TABLET | Freq: Every day | ORAL | Status: DC
  Administered 2016-04-12 – 2016-04-13 (×2): 20 mg via ORAL
  Filled 2016-04-12 (×2): qty 2

## 2016-04-12 MED ORDER — ASPIRIN 325 MG PO TABS
325.0000 mg | ORAL_TABLET | Freq: Every day | ORAL | Status: DC
  Administered 2016-04-13 – 2016-04-14 (×2): 325 mg via ORAL
  Filled 2016-04-12 (×3): qty 1

## 2016-04-12 MED ORDER — SODIUM CHLORIDE 0.9% FLUSH
10.0000 mL | INTRAVENOUS | Status: DC | PRN
  Administered 2016-04-12 – 2016-04-13 (×3): 20 mL
  Filled 2016-04-12 (×3): qty 40

## 2016-04-12 MED ORDER — RESOURCE THICKENUP CLEAR PO POWD
ORAL | Status: DC | PRN
  Filled 2016-04-12: qty 125

## 2016-04-12 NOTE — Plan of Care (Signed)
Problem: Tissue Perfusion: Goal: Risk factors for ineffective tissue perfusion will decrease Outcome: Not Progressing Reinstr. On I.S. For max lung expansion, able to get IS up to 500. Will con't to encourage.

## 2016-04-12 NOTE — Evaluation (Signed)
Occupational Therapy Evaluation Patient Details Name: Catherine ClossRegina Day MRN: [id number] DOB: 02/10/1928 Today's Date: 04/12/2016    History of Present Illness 82 year old female with PMH COPD, HTN, [facility] sudden onset R sided-weakness and aphasia. MRI brain-- scattered acute infarcts in the left hemisphere primarily in the left temporal and occipital lobes, and Lt cerebellar artery territory. tPa was administered, taken to IR for revascularization of occluded Lt MCA, ACA and ICA terminus. She was electively intubated for the procedure; extubated 11/24.   Clinical Impression   Pt reports being independent in self care, meal prep and light housekeeping prior to admission. She did not leave her house much. She reports bathing by getting down in her tub. Presents with impaired cognition, decreased activity tolerance, generalized weakness and impaired standing balance. No family available during session. Pt's goal is to be able to return to her home in [place] where she lives with her daughter. Will follow acutely.    Follow Up Recommendations  Supervision/Assistance - 24 hour;CIR    Equipment Recommendations       Recommendations for Other Services       Precautions / Restrictions Precautions Precautions: Fall Precaution Comments: on 2L 02 at baseline Restrictions Weight Bearing Restrictions: No      Mobility Bed Mobility Overal bed mobility: Needs Assistance Bed Mobility: Supine to Sit     Supine to sit: Supervision;HOB elevated     General bed mobility comments: no physical assist needed, used rail  Transfers Overall transfer level: Needs assistance Equipment used: None Transfers: Sit to/from [employer] CorporationStand;Stand Pivot Transfers Sit to Stand: Min guard Stand pivot transfers: Min guard       General transfer comment: min guard for safety, flexed posture    Balance     Sitting balance-Leahy Scale: Fair       Standing balance-Leahy Scale: Fair                               ADL Overall ADL's : Needs assistance/impaired Eating/Feeding: Independent;Sitting   Grooming: Brushing hair;Sitting;Set up   Upper Body Bathing: Minimal assitance;Sitting   Lower Body Bathing: Minimal assistance;Sit to/from stand   Upper Body Dressing : Minimal assistance;Sitting   Lower Body Dressing: Minimal assistance;Sit to/from stand Lower Body Dressing Details (indicate cue type and reason): pt able to cross foot over opposite knee to don and doff socks Toilet Transfer: Min guard;Stand-pivot;BSC   Toileting- Clothing Manipulation and Hygiene: Supervision/safety;Sitting/lateral lean               Vision Additional Comments: Pt noted to locate all items on lunch tray and could find items on menu.    Perception     Praxis      Pertinent Vitals/Pain Pain Assessment: No/denies pain     Hand Dominance Right   Extremity/Trunk Assessment Upper Extremity Assessment Upper Extremity Assessment: Generalized weakness   Lower Extremity Assessment Lower Extremity Assessment: Defer to PT evaluation       Communication Communication Communication: No difficulties   Cognition Arousal/Alertness: Awake/alert Behavior During Therapy: WFL for tasks assessed/performed Overall Cognitive Status: No family/caregiver present to determine baseline cognitive functioning Area of Impairment: Orientation;Safety/judgement Orientation Level: Time;Situation       Safety/Judgement: Decreased awareness of safety;Decreased awareness of deficits         General Comments       Exercises       Shoulder Instructions  Home Living Family/patient expects to be discharged to:: Private residence Living Arrangements: Children (lives with her daughter in [place]) Available Help at Discharge: Family;Available PRN/intermittently                         Home Equipment: Walker - 4 wheels (oxygen)   Additional Comments: pt reports she  lives in [place]; visiting her son      Prior Functioning/Environment Level of Independence: Independent        Comments: per pt        OT Problem List: Decreased activity tolerance;Impaired balance (sitting and/or standing);Decreased strength;Decreased cognition;Decreased knowledge of use of DME or AE;Decreased safety awareness   OT Treatment/Interventions: Self-care/ADL training;DME and/or AE instruction;Therapeutic exercise;Balance training;Patient/family education;Therapeutic activities    OT Goals(Current goals can be found in the care plan section) Acute Rehab OT Goals Patient Stated Goal[place] [facility] [place] OT Goal Formulation: With patient Time For Goal Achievement: 04/26/16 Potential to Achieve Goals: Good ADL Goals Pt Will Perform Grooming: with modified independence;standing Pt Will Perform Upper Body Bathing: with modified independence;sitting Pt Will Perform Lower Body Bathing: with modified independence;sit to/from stand Pt Will Perform Upper Body Dressing: with modified independence;sitting Pt Will Perform Lower Body Dressing: with modified independence;sit to/from stand Pt Will Transfer to Toilet: with modified independence;ambulating;bedside commode (over toilet) Pt Will Perform Toileting - Clothing Manipulation and hygiene: with modified independence;sit to/from stand Pt/caregiver will Perform Home Exercise Program: Increased strength;Both right and left upper extremity;With theraband;With Supervision (level 2)  OT Frequency: Min 3X/week   Barriers to D/C:            Co-evaluation              End of Session Equipment Utilized During Treatment: Gait belt;Oxygen  Activity Tolerance: Patient tolerated treatment well Patient left: in chair;with chair alarm set;with call bell/phone within reach   Time: 1355-1420 OT Time Calculation (min): 25 min Charges:  OT General Charges $OT Visit: 1 Procedure OT Evaluation $OT Eval Moderate Complexity: 1  Procedure OT Treatments $Self Care/Home Management : 8-22 mins G-Codes:    Evern BioMayberry, Ronold Hardgrove Lynn 04/12/2016, 2:38 PM  [phone]

## 2016-04-12 NOTE — Progress Notes (Signed)
Speech Language Pathology Treatment: Dysphagia  Patient Details Name: Catherine ClossRegina Day MRN: [id number] DOB: 10/09/1927 Today's Date: 04/12/2016 Time: [id number] SLP Time Calculation (min) (ACUTE ONLY): 8 min  Assessment / Plan / Recommendation Clinical Impression  Pt continues to have a weak volitional cough and mild audible wetness at baseline. Throughout PO trials she has frequent, delayed throat clearing and occasional immediate coughing. Recommend to proceed with MBS to determine least restrictive diet.   HPI HPI: Ms Catherine Day is an 82 yo female with h/o COPD admitted with mental status change, right sided weakness, hemianopsia, left gaze preference,  Pt required intubation and was extubated yesterday.  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  Swallow and speech eval ordered      SLP Plan  MBS     Recommendations  Diet recommendations: NPO Medication Administration: Crushed with puree                Oral Care Recommendations: Oral care BID Follow up Recommendations: Inpatient Rehab Plan: MBS       GO                Catherine Day, Catherine Day 04/12/2016, 9:47 AM  Catherine HamLaura Day, M.A. CCC-SLP [phone]

## 2016-04-12 NOTE — Progress Notes (Signed)
Modified Barium Swallow Progress Note  Patient Details  Name: Catherine ClossRegina Day MRN: [id number] Date of Birth: 04/09/1928  Today's Date: 04/12/2016  Modified Barium Swallow completed.  Full report located under Chart Review in the Imaging Section.  Brief recommendations include the following:  Clinical Impression  Pt has a moderate pharyngeal dysphagia due in large part to delayed swallow initiation and decreased sensation of airway penetration. Liquids spill to the pyriform sinuses, resulting in deep penetration of thin and nectar thick liquids. Although this does not occur with every bolus, it is silent and unable to be completely cleared with cued coughing, making her at a higher risk for an aspiration-related infection. Her strength is adequate with no significant residuals remaining post-swallow. Recommend Dys 3 diet and honey thick liquids. SLP to f/u for tolerance and readiness to advance.   Swallow Evaluation Recommendations       SLP Diet Recommendations: Dysphagia 3 (Mech soft) solids;Honey thick liquids   Liquid Administration via: Cup;No straw   Medication Administration: Whole meds with puree   Supervision: Patient able to self feed;Full supervision/cueing for compensatory strategies   Compensations: Slow rate;Small sips/bites   Postural Changes: Remain semi-upright after after feeds/meals (Comment);Seated upright at 90 degrees   Oral Care Recommendations: Oral care BID   Other Recommendations: Order thickener from pharmacy;Prohibited food (jello, ice cream, thin soups);Remove water pitcher    Maxcine Hamaiewonsky, [name] 04/12/2016,1:04 PM   Maxcine HamLaura Paiewonsky, M.A. CCC-SLP [phone]

## 2016-04-12 NOTE — Progress Notes (Signed)
STROKE TEAM PROGRESS NOTE   HISTORY OF PRESENT ILLNESS (per record) Hughie ClossRegina Simons is an 82 y.o. female who was brought in by family because of sudden onset of right sided weakness and inability to speak.  The hx is obtained from the daughter and chart review.  The daughter noticed the sx suddenly and called EMS.  The patient has no prior h/o stroke.  She was not routinely taking an ASA daily.  She is generally functional except for impairment from her chronic lung disease. History of Stroke: No.  TPA 04/09/2016 at 1900.  Date last known well:Date: 04/09/2016 Time last known well: Time: 17:30  Interventional Radiology S/P bilateral common carotid arteriograms  followed  by complete revascularization of occluded LT MCA ,Lt ACA and Lt ICA terminus with x1 PASS WITH A COMBINATION OF solitaire 4mm x 40 mm retrieval device with simultaneous aspiration at the intermediate guide catheter and flow gate guide catheter, and 3.6 mg of superselective intracranial  INTEGRELIN achieving a TICI 3 reperfusion  SUBJECTIVE (INTERVAL HISTORY) No family at bedside. Oriented to self only. Alert. Pleasant. Says it is December 1953 and she is in [place]. MBS is oending due to cough and delayed throat clearing still NPO. Medication crush with puree.   OBJECTIVE Temp:  [98.1 F (36.7 C)-98.8 F (37.1 C)] 98.4 F (36.9 C) (11/26 1313) Pulse Rate:  [71-88] 74 (11/26 1313) Cardiac Rhythm: Normal sinus rhythm;Bundle branch block (11/26 0720) Resp:  [15-20] 18 (11/26 1313) BP: (117-166)/(52-67) 166/52 (11/26 1313) SpO2:  [93 %-100 %] 93 % (11/26 1313)  CBC:  Recent Labs Lab 04/09/16 1827  04/10/16 0636 04/11/16 0530 04/12/16 0220  WBC 12.9*  --  16.6* 15.1* 10.3  NEUTROABS 11.7*  --  14.1*  --   --   HGB 14.5  < > 12.1 10.9* 10.8*  HCT 44.1  < > 37.6 34.3* 34.6*  MCV 83.8  --  84.1 84.9 86.1  PLT 168  --  142* 157 163  < > = values in this interval not displayed.  Basic Metabolic Panel:   Recent  Labs Lab 04/11/16 0530 04/12/16 0220  NA 138 137  K 3.3* 3.6  CL 105 108  CO2 24 19*  GLUCOSE 93 69  BUN 14 10  CREATININE 0.71 0.63  CALCIUM 7.2* 6.9*    Lipid Panel:     Component Value Date/Time   CHOL 159 04/10/2016 0637   TRIG 76 04/10/2016 0637   HDL 52 04/10/2016 0637   CHOLHDL 3.1 04/10/2016 0637   VLDL 15 04/10/2016 0637   LDLCALC 92 04/10/2016 0637   HgbA1c:  Lab Results  Component Value Date   HGBA1C 5.8 (H) 04/10/2016   Urine Drug Screen: No results found for: LABOPIA, COCAINSCRNUR, LABBENZ, AMPHETMU, THCU, LABBARB    IMAGING I have personally reviewed the radiological images below and agree with the radiology interpretations.  Ct Head Wo Contrast 04/09/2016 No large territory acute infarct or intracranial hemorrhage identified. Patchy foci of hypoattenuation in periventricular and subcortical white matter may represent areas of infarction or chronic microvascular ischemic changes and are stable. If clinically indicated MRI is more sensitive for acute ischemia.   Dg Chest Port 1 View 04/10/2016 1. Endotracheal tube seen ending 2 cm above the carina.  2. Right IJ line noted ending about the distal SVC.  3. Enteric tube noted extending below the diaphragm.  4. Mild bibasilar atelectasis.   Ct Head Code Stroke Wo Contrast` 04/09/2016 No acute finding.  ASPECTS is  10.   Cerebral angio 04/09/2016 S/P bilateral common carotid arteriograms  followed  by complete revascularization of occluded LT MCA ,Lt ACA and Lt ICA terminus with x1 PASS WITH A COMBINATION OF solitaire 4mm x 40 mm retrieval device with simultaneous aspiration at the intermediate guide catheter and flow gate guide catheter, and 3.6 mg of superselective intracranial  INTEGRELIN achieving a TICI 3 reperfusion   MRI Head / MRA Brain 04/10/2016 1. Small volume of scattered acute infarcts in the left hemisphere primarily in the left temporal and occipital lobes  (note fetal type left PCA  origin anatomy). 2. Petechial hemorrhage in the mesial left temporal lobe tracking to the cauda thalamic groove.   No associated mass effect and no malignant hemorrhagic transformation. 3. A small number of other scattered small infarcts in the bilateral MCA and left cerebellar artery territories might be  related to endovascular intervention. 4. Motion degraded intracranial MRA negative for emergent large vessel occlusion or proximal intracranial stenosis.   Possible occlusion of the left PCA P3 inferior division.    DG Swallowing Func-Speech Pathology  04/12/2016 Diet recommendations - dysphagia 3 mechanical soft solids with honey thick liquids.     Transthoracic echocardiogram 04/11/2016 Study Conclusions - Left ventricle: The cavity size was normal. Wall thickness was   normal. Systolic function was probably mildly reduced. There was   an increased relative contribution of atrial contraction to   ventricular filling, which may be due to aging or hypovolemia.   Doppler parameters are consistent with abnormal left ventricular   relaxation (grade 1 diastolic dysfunction). Impressions: - even after microbubble contrasrt was administered, LV function   and wall motion evaluation is limited.  Recommend an alternative method for LVEF evaluation.    PHYSICAL EXAM  Temp:  [98.1 F (36.7 C)-98.8 F (37.1 C)] 98.4 F (36.9 C) (11/26 1313) Pulse Rate:  [71-88] 74 (11/26 1313) Resp:  [15-20] 18 (11/26 1313) BP: (117-166)/(52-67) 166/52 (11/26 1313) SpO2:  [93 %-100 %] 93 % (11/26 1313)  General - Well nourished, well developed, intubated on low dose propofol.  Ophthalmologic - Fundi not visualized due to lethargy.  Cardiovascular - Regular rate and rhythm.  Neuro - Extubated, alert, Oriented to self only. Alert. Pleasant. Says it is December 1953 and she is in [place], [place]. follows all simple midline and peripheral commands. Eyes in mid position, able to attend both sides,  PERRL, impaired upgaze otherwise EOMI, facial symmetric. Moves all extremities symmetrically 4/5 proximal and distally.DTR 1+ bilaterally and no babinski. Sensation symmetrical although limited testing due impaired cognition. Slow FTN but symmetrical. Gait not tested.    ASSESSMENT/PLAN Ms. Preet Mangano is a 82 y.o. female with history of COPD and hypertension presenting with sudden onset of right sided weakness and inability to speak.   She received IV t-PA and underwent mechanical thrombectomy with complete revascularization of occluded LT MCA ,Lt ACA and Lt ICA terminus.  Stroke:  Dominant left brain infarct s/p tPA and revascularization of occluded LT MCA ,Lt ACA and Lt ICA terminus, embolic pattern secondary to likely cardioembolic source.  Resultant  Rapid neuro improvement, still only oriented to self.  MRI - Small volume of scattered acute infarcts in the left hemisphere. petechial hemorrhage in the left mesial temporal lobe   A small number of other scattered small infarcts in the bilateral MCA and left cerebellar artery territories   MRA - Possible occlusion of the left PCA P3 inferior division.  Cerebral angiogram - complete revascularization of occluded  LT MCA ,Lt ACA and Lt ICA terminus  2D Echo - Systolic function was probably mildly reduced. No cardiac source of emboli identified.  TEE and loop recorder depending on further stroke work up   LDL - 92  HgbA1c - 5.8  VTE prophylaxis - SCDs Diet NPO time specified Except for: Sips with Meds DIET DYS 3 Room service appropriate? Yes; Fluid consistency: Honey Thick  No antithrombotic prior to admission, now on Aspirin 325 mg daily will monitor  Ongoing aggressive stroke risk factor management  Therapy recommendations: Inpatient rehabilitation consultation recommended -> ordered  Disposition:  Pending  Hypertension  Stable  BP goal 120 - 160 post mechanical thrombectomy  Long-term BP goal  normotensive  Hyperlipidemia  Home meds:  No lipid lowering medications prior to admission  LDL 92, goal < 70  Add Lipitor 20 mg daily  Continue statin at discharge  Other Stroke Risk Factors  Advanced age  Other Active Problems  Contrast extravasation left arm after IV infiltrated.  Leukocytosis - 16.6  -> 15.1 (afebrile) -> 10.3  Hypokalemia - 3.3 -> 3.6 - continue to monitor  Anemia - 10.8 / 34.6 - continue to monitor - check stool guaiacs and iron levels  PLAN  Transfered to floor  Supplement potassium IV. -> 3.6  Patient cleared for dysphagia 3 diet  Change aspirin to PO instead of suppository.  Rehabilitation M.D. consult for possible CIR  Repeat labs Tuesday  Hospital day # 3   Personally examined patient and images, and have participated in and made any corrections needed to history, physical, neuro exam,assessment and plan as stated above.  I have personally obtained the history, evaluated lab date, reviewed imaging studies and agree with radiology interpretations.    Naomie DeanAntonia Heaton Sarin, MD Stroke Neurology    To contact Stroke Continuity provider, please refer to [email or web address]. After hours, contact General Neurology

## 2016-04-13 ENCOUNTER — Encounter (HOSPITAL_COMMUNITY): Payer: Self-pay | Admitting: Interventional Radiology

## 2016-04-13 ENCOUNTER — Inpatient Hospital Stay (HOSPITAL_COMMUNITY): Payer: Medicare Other

## 2016-04-13 ENCOUNTER — Encounter (HOSPITAL_COMMUNITY)
Admission: EM | Disposition: A | Discharge status: Rehabilitation Facility | Payer: Self-pay | Source: Home / Self Care | Attending: Neurology

## 2016-04-13 DIAGNOSIS — I63513 Cerebral infarction due to unspecified occlusion or stenosis of bilateral middle cerebral arteries: Secondary | ICD-10-CM

## 2016-04-13 DIAGNOSIS — I5189 Other ill-defined heart diseases: Secondary | ICD-10-CM

## 2016-04-13 DIAGNOSIS — D62 Acute posthemorrhagic anemia: Secondary | ICD-10-CM

## 2016-04-13 DIAGNOSIS — I519 Heart disease, unspecified: Secondary | ICD-10-CM

## 2016-04-13 DIAGNOSIS — I639 Cerebral infarction, unspecified: Secondary | ICD-10-CM

## 2016-04-13 DIAGNOSIS — J449 Chronic obstructive pulmonary disease, unspecified: Secondary | ICD-10-CM

## 2016-04-13 DIAGNOSIS — I63311 Cerebral infarction due to thrombosis of right middle cerebral artery: Secondary | ICD-10-CM

## 2016-04-13 DIAGNOSIS — D72829 Elevated white blood cell count, unspecified: Secondary | ICD-10-CM

## 2016-04-13 DIAGNOSIS — E876 Hypokalemia: Secondary | ICD-10-CM

## 2016-04-13 HISTORY — PX: TEE WITHOUT CARDIOVERSION: SHX5443

## 2016-04-13 LAB — CBC
HCT: 31.2 % — ABNORMAL LOW (ref 36.0–46.0)
Hemoglobin: 9.9 g/dL — ABNORMAL LOW (ref 12.0–15.0)
MCH: 26.8 pg (ref 26.0–34.0)
MCHC: 31.7 g/dL (ref 30.0–36.0)
MCV: 84.6 fL (ref 78.0–100.0)
PLATELETS: 173 10*3/uL (ref 150–400)
RBC: 3.69 MIL/uL — AB (ref 3.87–5.11)
RDW: 14.1 % (ref 11.5–15.5)
WBC: 11.1 10*3/uL — AB (ref 4.0–10.5)

## 2016-04-13 LAB — BASIC METABOLIC PANEL
ANION GAP: 5 (ref 5–15)
BUN: 11 mg/dL (ref 6–20)
CO2: 23 mmol/L (ref 22–32)
Calcium: 6.9 mg/dL — ABNORMAL LOW (ref 8.9–10.3)
Chloride: 108 mmol/L (ref 101–111)
Creatinine, Ser: 0.67 mg/dL (ref 0.44–1.00)
GFR calc Af Amer: >60 mL/min (ref >60)
Glucose, Bld: 114 mg/dL — ABNORMAL HIGH (ref 65–99)
POTASSIUM: 3.2 mmol/L — AB (ref 3.5–5.1)
SODIUM: 136 mmol/L (ref 135–145)

## 2016-04-13 SURGERY — ECHOCARDIOGRAM, TRANSESOPHAGEAL
Anesthesia: Moderate Sedation

## 2016-04-13 MED ORDER — BUTAMBEN-TETRACAINE-BENZOCAINE 2-2-14 % EX AERO
INHALATION_SPRAY | CUTANEOUS | Status: DC | PRN
  Administered 2016-04-13: 2 via TOPICAL

## 2016-04-13 MED ORDER — FENTANYL CITRATE (PF) 100 MCG/2ML IJ SOLN
INTRAMUSCULAR | Status: DC | PRN
  Administered 2016-04-13: 25 ug via INTRAVENOUS

## 2016-04-13 MED ORDER — MIDAZOLAM HCL 5 MG/ML IJ SOLN
INTRAMUSCULAR | Status: AC
  Filled 2016-04-13: qty 2

## 2016-04-13 MED ORDER — SODIUM CHLORIDE 0.9 % IV SOLN: INTRAVENOUS | Status: DC

## 2016-04-13 MED ORDER — FENTANYL CITRATE (PF) 100 MCG/2ML IJ SOLN
INTRAMUSCULAR | Status: AC
  Filled 2016-04-13: qty 2

## 2016-04-13 MED ORDER — MIDAZOLAM HCL 10 MG/2ML IJ SOLN
INTRAMUSCULAR | Status: DC | PRN
Start: 2016-04-13 — End: 2016-04-13
  Administered 2016-04-13: 2 mg via INTRAVENOUS
  Administered 2016-04-13: 1 mg via INTRAVENOUS

## 2016-04-13 NOTE — Progress Notes (Signed)
Physical Therapy Treatment Patient Details Name: Catherine ClossRegina Day MRN: [id number] DOB: 12/07/1927 Today's Date: 04/13/2016    History of Present Illness 82 year old female with PMH COPD, HTN, [facility] to [facility] with sudden onset R sided-weakness and aphasia. MRI brain-- scattered acute infarcts in the left hemisphere primarily in the left temporal and occipital lobes, and Lt cerebellar artery territory. tPa was administered, taken to IR for revascularization of occluded Lt MCA, ACA and ICA terminus. She was electively intubated for the procedure; extubated 11/24.    PT Comments    Pt's family in room during session today and provided some PLOF details. Daughter reports that pt is not at her baseline from both a physical and cognitive perspective. During session, pt was able to perform transfers and ambulation with min guard to min assist for balance support and safety. Pt fatigued very quickly with ambulation and required seated rest break prior to returning to room. Recommend chair follow for next session. As pt was very independent PTA and has strong family su\pport available at d/c, feel that pt would benefit from continued rehab at the CIR level prior to return home.  Follow Up Recommendations  CIR     Equipment Recommendations  Other (comment) (TBA)    Recommendations for Other Services Rehab consult;OT consult;Speech consult     Precautions / Restrictions Precautions Precautions: Fall Precaution Comments: on 2L 02 at baseline Restrictions Weight Bearing Restrictions: No    Mobility  Bed Mobility               General bed mobility comments: Pt sitting up in recliner with family present when PT arrived.   Transfers Overall transfer level: Needs assistance Equipment used: Rolling walker (2 wheeled) Transfers: Sit to/from Stand Sit to Stand: Min assist         General transfer comment: VC's for hand placement on seated surface for safety. Pt required assist for  controlled descent to chair.   Ambulation/Gait Ambulation/Gait assistance: Min assist Ambulation Distance (Feet): 50 Feet Assistive device: Rolling walker (2 wheeled) Gait Pattern/deviations: Step-through pattern;Decreased stride length;Trendelenburg;Trunk flexed Gait velocity: Decreased Gait velocity interpretation: Below normal speed for age/gender General Gait Details: VC's for improved posture and general safety with the RW. Pt required occasional min assist for balance support and walker maneuvering.    Stairs            Wheelchair Mobility    Modified Rankin (Stroke Patients Only) Modified Rankin (Stroke Patients Only) Pre-Morbid Rankin Score: No symptoms (per pt; ?accuracy) Modified Rankin: Moderately severe disability     Balance Overall balance assessment: Needs assistance Sitting-balance support: Feet supported;No upper extremity supported Sitting balance-Leahy Scale: Fair     Standing balance support: No upper extremity supported;During functional activity Standing balance-Leahy Scale: Poor Standing balance comment: Requires UE support for safety and balance.                     Cognition Arousal/Alertness: Awake/alert Behavior During Therapy: WFL for tasks assessed/performed Overall Cognitive Status: Impaired/Different from baseline Area of Impairment: Safety/judgement;Memory     Memory: Decreased short-term memory   Safety/Judgement: Decreased awareness of safety;Decreased awareness of deficits Awareness:  (pre-intellectual) Problem Solving: Slow processing;Requires verbal cues      Exercises      General Comments        Pertinent Vitals/Pain Pain Assessment: No/denies pain    Home Living  Prior Function            PT Goals (current goals can now be found in the care plan section) Acute Rehab PT Goals Patient Stated Goal: [place] PT Goal Formulation: With patient Time For Goal  Achievement: 04/18/16 Potential to Achieve Goals: Good Progress towards PT goals: Progressing toward goals    Frequency    Min 4X/week      PT Plan Current plan remains appropriate    Co-evaluation PT/OT/SLP Co-Evaluation/Treatment: Yes Reason for Co-Treatment: For patient/therapist safety PT goals addressed during session: Mobility/safety with mobility;Balance;Proper use of DME       End of Session Equipment Utilized During Treatment: Gait belt;Oxygen Activity Tolerance: Patient tolerated treatment well Patient left: in chair;with call bell/phone within reach;with chair alarm set;with family/visitor present     Time: 1340-1409 PT Time Calculation (min) (ACUTE ONLY): 29 min  Charges:  $Gait Training: 8-22 mins                    G Codes:      Marylynn PearsonLaura D Kambry Takacs 04/13/2016, 2:35 PM   Conni SlipperLaura Edwing Figley, PT, DPT Acute Rehabilitation Services Pager: [phone]

## 2016-04-13 NOTE — Op Note (Signed)
INDICATIONS: stroke  PROCEDURE:   Informed consent was obtained prior to the procedure. The risks, benefits and alternatives for the procedure were discussed and the patient comprehended these risks.  Risks include, but are not limited to, cough, sore throat, vomiting, nausea, somnolence, esophageal and stomach trauma or perforation, bleeding, low blood pressure, aspiration, pneumonia, infection, trauma to the teeth and death.    After a procedural time-out, the oropharynx was anesthetized with 20% benzocaine spray.   During this procedure the patient was administered a total of Versed 3 mg and Fentanyl 25 mcg to achieve and maintain moderate conscious sedation.  The patient's heart rate, blood pressure, and oxygen saturationweare monitored continuously during the procedure. The period of conscious sedation was 12 minutes, of which I was present face-to-face 100% of this time.  The transesophageal probe was inserted in the esophagus and stomach without difficulty and multiple views were obtained.  The patient was kept under observation until the patient left the procedure room.  The patient left the procedure room in stable condition.   Agitated microbubble saline contrast was administered.  COMPLICATIONS:    There were no immediate complications.  FINDINGS:  No intracardiac source of embolism or shunt. Moderate atherosclerosis in the ascnding aorta and arch, but without frank plaque ulceration or thrombus.  RECOMMENDATIONS:     Proceed with ILR.  Time Spent Directly with the Patient:  30 minutes   Stephonie Wilcoxen 04/13/2016, 3:40 PM

## 2016-04-13 NOTE — Care Management Note (Signed)
Case Management Note  Patient Details  Name: Catherine ClossRegina Doig MRN: [id number] Date of Birth: 08/15/1927  Subjective/Objective:   Pt admitted with CVA. She is up with her daughter visiting her son from [place]. She is requesting to return to [place] at discharge. Pt without insurance listed. Pt unsure of her insurance and is unable to provide CM with the number to her daughter to see if she may have the patients insurance card. Per patient daughter will visit this afternoon. CM will attempt to catch daughter.                  Action/Plan: CIR recommending HH vs SNF. CM following for discharge disposition.   Expected Discharge Date:                  Expected Discharge Plan:  Home w Home Health Services  In-House Referral:     Discharge planning Services     Post Acute Care Choice:    Choice offered to:     DME Arranged:    DME Agency:     HH Arranged:    HH Agency:     Status of Service:  In process, will continue to follow  If discussed at Long Length of Stay Meetings, dates discussed:    Additional Comments:  Kermit BaloKelli F Jazarah Capili, RN 04/13/2016, 12:30 PM

## 2016-04-13 NOTE — Evaluation (Signed)
Speech Language Pathology Evaluation Patient Details Name: Hughie ClossRegina Drennen MRN: [id number] DOB: 12/12/1927 Today's Date: 04/13/2016 Time: [id number] SLP Time Calculation (min) (ACUTE ONLY): 17 min  Problem List:  Patient Active Problem List   Diagnosis Date Noted  . Cerebrovascular accident (CVA) due to thrombosis of right middle cerebral artery (HCC)   . Diastolic dysfunction   . Chronic obstructive pulmonary disease (HCC)   . Acute blood loss anemia   . Leukocytosis   . Hypokalemia   . Cerebrovascular accident (CVA) due to bilateral occlusion of middle cerebral arteries (HCC)   . Acute ischemic stroke (HCC)   . Cerebrovascular accident (CVA) due to thrombosis of left middle cerebral artery (HCC) 04/09/2016   Past Medical History:  Past Medical History:  Diagnosis Date  . COPD (chronic obstructive pulmonary disease) (HCC)    Past Surgical History:  Past Surgical History:  Procedure Laterality Date  . IR GENERIC HISTORICAL  04/09/2016   IR ANGIO INTRA EXTRACRAN SEL COM CAROTID INNOMINATE UNI R MOD SED 04/09/2016 Julieanne CottonSanjeev Deveshwar, MD [facility]-INTERV RAD  . IR GENERIC HISTORICAL  04/09/2016   IR PERCUTANEOUS ART THROMBECTOMY/INFUSION INTRACRANIAL INC DIAG ANGIO 04/09/2016 Julieanne CottonSanjeev Deveshwar, MD [facility]INTER[facility]  . PELVIC FRACTURE SURGERY    . RADIOLOGY WITH ANESTHESIA N/A 04/09/2016   Procedure: RADIOLOGY WITH ANESTHESIA;  Surgeon: Julieanne CottonSanjeev Deveshwar, MD;  Location: [facility] OR;  Service: Radiology;  Laterality: N/A;   HPI:  Ms [name] is an 82 yo female with h/o COPD admitted with mental status change, right sided weakness, hemianopsia, left gaze preference, Pt required intubation and was extubated yesterday. 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    Assessment / Plan / Recommendation Clinical Impression  Cognitive baseline status unknown to this SLP; suspect memory challenges. Pt states she lives with  her daughter in [place] and was her visitng son for Thanksgiving. Shre reports managing her finances and medication without assistance.  She scored within normal range on the Cognistat Assessment except for working memory which was in the severely impaired range. Pt stated she did not recall stating the 4 words earlier in the session. Pt requires support for working memory and suspect daughter may be assisting with this more than pt relayed. Will attempt to speak with/educate daughter to findings and recommendations.       SLP Assessment  Patient needs continued Speech Lanaguage Pathology Services    Follow Up Recommendations   (TBD)    Frequency and Duration min 1 x/week  1 week      SLP Evaluation Cognition  Overall Cognitive Status: Impaired/Different from baseline Arousal/Alertness: Awake/alert Orientation Level: Oriented to place;Oriented to person;Disoriented to time;Disoriented to situation Attention: Sustained Sustained Attention: Appears intact Memory: Impaired Memory Impairment: Storage deficit;Retrieval deficit Awareness: Impaired Awareness Impairment: Emergent impairment;Intellectual impairment;Anticipatory impairment Problem Solving: Impaired Problem Solving Impairment: Verbal basic Safety/Judgment: Impaired       Comprehension  Auditory Comprehension Overall Auditory Comprehension: Appears within functional limits for tasks assessed Visual Recognition/Discrimination Discrimination: Not tested Reading Comprehension Reading Status: Not tested    Expression Expression Primary Mode of Expression: Verbal Verbal Expression Overall Verbal Expression: Appears within functional limits for tasks assessed (1-2 semantic paraphasia) Initiation: No impairment Level of Generative/Spontaneous Verbalization: Sentence Repetition: No impairment Naming: No impairment Pragmatics: No impairment Written Expression Dominant Hand: Right Written Expression: Not tested   Oral /  Motor  Oral Motor/Sensory Function Overall Oral Motor/Sensory Function: Within functional limits Motor Speech Overall  Motor Speech: Appears within functional limits for tasks assessed Respiration: Within functional limits Phonation: Normal Resonance: Within functional limits Articulation: Within functional limitis Intelligibility: Intelligible Motor Planning: Witnin functional limits   GO                    Royce MacadamiaLitaker, Lourdes Manning Willis 04/13/2016, 2:59 PM  Breck CoonsLisa Willis Michale Emmerich M.Ed [employer]-SLP Pager [phone]

## 2016-04-13 NOTE — Consult Note (Signed)
Physical Medicine and Rehabilitation Consult Reason for Consult: small volume of scattered acute infarct left hemisphere primarily in the left temporal occipital lobes. Petechial hemorrhage in the mesial left temporal lobe tracking to the cauda thalamic groove. Scattered small infarcts bilateral MCA and left cerebellar artery territories Referring Physician: [facility]   HPI: Catherine ClossRegina Day is a 82 y.o. right handed female with history of COPD. Per chart review patient lives in [place] with her daughter. Independent prior to admission. She had been visiting a son in [place]. Presented 04/10/2016 with sudden onset of right-sided weakness and inability to speak. CT/ MRI showed small volume of scattered acute infarct in the left hemisphere primarily in the left temporal and occipital lobes with petechial hemorrhage in the left temporal lobe. No associated mass effect. Small number of other scattered small infarcts in the bilateral MCA and left cerebellar artery territories. Patient did receive TPA. MRI showed possible occlusion of the left PCA P3 inferior division.. Echocardiogram with systolic function mildly reduced. Grade 1 diastolic dysfunction. Underwent bilateral common carotid arteriograms followed by complete revascularization of occluded left MCA, left ACA and left ICA terminus. Neurology follow-up currently maintained on aspirin for CVA prophylaxis. MBS completed 04/12/2016 advised mechanical soft honey thick liquid diet. Physical and occupational therapy evaluations completed with recommendations of physical medicine rehabilitation consult.   Review of Systems  Constitutional: Negative for chills and fever.  HENT: Negative for hearing loss and tinnitus.   Eyes: Negative for blurred vision and double vision.  Respiratory: Positive for shortness of breath. Negative for cough.   Cardiovascular: Positive for leg swelling. Negative for chest pain and palpitations.  Gastrointestinal:  Positive for constipation. Negative for nausea and vomiting.  Genitourinary: Negative for dysuria, flank pain and hematuria.  Musculoskeletal: Positive for joint pain and myalgias.  Skin: Negative for rash.  Neurological: Positive for speech change and weakness. Negative for seizures and loss of consciousness.  Psychiatric/Behavioral: Positive for memory loss.  All other systems reviewed and are negative.  Past Medical History:  Diagnosis Date  . COPD (chronic obstructive pulmonary disease) (HCC)    Past Surgical History:  Procedure Laterality Date  . PELVIC FRACTURE SURGERY    . RADIOLOGY WITH ANESTHESIA N/A 04/09/2016   Procedure: RADIOLOGY WITH ANESTHESIA;  Surgeon: Julieanne CottonSanjeev Deveshwar, MD;  Location: [facility] OR;  Service: Radiology;  Laterality: N/A;   History reviewed. No pertinent family history related to early CVA Social History:  reports that she has never smoked. She has never used smokeless tobacco. She reports that she does not drink alcohol or use drugs. Allergies: No Known Allergies Medications Prior to Admission  Medication Sig Dispense Refill  . Calcium Citrate-Vitamin D (CITRACAL + D PO) Take 1 tablet by mouth daily.    Marland Kitchen. denosumab (PROLIA) 60 MG/ML SOLN injection Inject 60 mg into the skin every 6 (six) months. Administer in upper arm, thigh, or abdomen Last injection 03/24/16    . fluticasone furoate-vilanterol (BREO ELLIPTA) 100-25 MCG/INH AEPB Inhale 1 puff into the lungs daily.    Marland Kitchen. ipratropium-albuterol (DUONEB) 0.5-2.5 (3) MG/3ML SOLN Take 3 mLs by nebulization See admin instructions. Inhale 1 vial by nebulization every morning and may also use 2 more times during the day as needed for shortness of breath or wheezing    . levalbuterol (XOPENEX HFA) 45 MCG/ACT inhaler Inhale 1-2 puffs into the lungs every 4 (four) hours as needed for wheezing or shortness of breath.    . methylPREDNISolone (MEDROL DOSEPAK) 4 MG TBPK  tablet Take 4-8 mg by mouth See admin instructions.  Tapered course started 04/09/16 am -Day 1: take 2 tablets (8 mg) by mouth with breakfast, 1 tablet (4 mg) with lunch and supper and 2 tablets at bedtime; Day 2: take 1 tablet with breakfast, lunch and supper and 2 tablets at bedtime; Day 3: take 1 tablet with breakfast, lunch, supper and at bedtime; Day 4: take 1 tablet with breakfast, lunch and at bedtime; Day 5: take 1 tablet with breakfast and at bedtime; Day 6: take 1 tablet with breakfast  0  . OXYGEN Inhale 2 L into the lungs continuous.    Marland Kitchen. tiotropium (SPIRIVA) 18 MCG inhalation capsule Place 18 mcg into inhaler and inhale daily.    Marland Kitchen. tobramycin, PF, (TOBI) 300 MG/5ML nebulizer solution Take 300 mg by nebulization daily.    . valsartan-hydrochlorothiazide (DIOVAN-HCT) 80-12.5 MG tablet Take 1 tablet by mouth daily.      Home: Home Living Family/patient expects to be discharged to:: Private residence Living Arrangements: Children (lives with her daughter in [place]) Available Help at Discharge: Family, Available PRN/intermittently Home Equipment: Dan HumphreysWalker - 4 wheels (oxygen) Additional Comments: pt reports she lives in [place]; visiting her son  Functional History: Prior Function Level of Independence: Independent Comments: per pt Functional Status:  Mobility: Bed Mobility Overal bed mobility: Needs Assistance Bed Mobility: Supine to Sit Supine to sit: Supervision, HOB elevated General bed mobility comments: no physical assist needed, used rail Transfers Overall transfer level: Needs assistance Equipment used: None Transfers: Sit to/from Stand, Stand Pivot Transfers Sit to Stand: Min guard Stand pivot transfers: Min guard General transfer comment: min guard for safety, flexed posture Ambulation/Gait General Gait Details: unable with 1 person due to multiple ICU lines    ADL: ADL Overall ADL's : Needs assistance/impaired Eating/Feeding: Independent, Sitting Grooming: Brushing hair, Sitting, Set up Upper Body Bathing: Minimal  assitance, Sitting Lower Body Bathing: Minimal assistance, Sit to/from stand Upper Body Dressing : Minimal assistance, Sitting Lower Body Dressing: Minimal assistance, Sit to/from stand Lower Body Dressing Details (indicate cue type and reason): pt able to cross foot over opposite knee to don and doff socks Toilet Transfer: Min guard, Stand-pivot, BSC Toileting- Clothing Manipulation and Hygiene: Supervision/safety, Sitting/lateral lean  Cognition: Cognition Overall Cognitive Status: No family/caregiver present to determine baseline cognitive functioning Orientation Level: Oriented to person, Disoriented to place, Disoriented to time, Disoriented to situation Cognition Arousal/Alertness: Awake/alert Behavior During Therapy: WFL for tasks assessed/performed Overall Cognitive Status: No family/caregiver present to determine baseline cognitive functioning Area of Impairment: Orientation, Safety/judgement Orientation Level: Time, Situation Current Attention Level: Sustained Safety/Judgement: Decreased awareness of safety, Decreased awareness of deficits Awareness:  (pre-intellectual) Problem Solving: Slow processing, Requires verbal cues General Comments: thought she was in hospital because "they found a lump"  Blood pressure (!) 131/51, pulse 83, temperature 98.4 F (36.9 C), temperature source Oral, resp. rate (!) 24, height 5\' 6"  (1.676 m), weight 77.4 kg (170 lb 10.2 oz), SpO2 99 %. Physical Exam  Vitals reviewed. Constitutional: She appears well-developed.  Obese  HENT:  Head: Normocephalic and atraumatic.  Eyes: Conjunctivae and EOM are normal.  Neck: Normal range of motion. Neck supple. No thyromegaly present.  Cardiovascular: Normal rate and regular rhythm.   Respiratory: Effort normal and breath sounds normal.  +[place]  GI: Soft. Bowel sounds are normal. She exhibits no distension.  Musculoskeletal: She exhibits no edema or tenderness.  sit/stand, transfers with supervision    Neurological: She is alert.  A&Ox1 Impulsive Motor: >4/5  throughout   Skin: Skin is warm and dry.  Psychiatric: She has a normal mood and affect. Her behavior is normal.    Results for orders placed or performed during the hospital encounter of 04/09/16 (from the past 24 hour(s))  Iron and TIBC     Status: Abnormal   Collection Time: 04/12/16  3:00 PM  Result Value Ref Range   Iron 24 (L) 28 - 170 ug/dL   TIBC 161 (L) 096 - 045 ug/dL   Saturation Ratios 11 10.4 - 31.8 %   UIBC 204 ug/dL  CBC     Status: Abnormal   Collection Time: 04/13/16  4:32 AM  Result Value Ref Range   WBC 11.1 (H) 4.0 - 10.5 K/uL   RBC 3.69 (L) 3.87 - 5.11 MIL/uL   Hemoglobin 9.9 (L) 12.0 - 15.0 g/dL   HCT 40.9 (L) 81.1 - 91.4 %   MCV 84.6 78.0 - 100.0 fL   MCH 26.8 26.0 - 34.0 pg   MCHC 31.7 30.0 - 36.0 g/dL   RDW 78.2 95.6 - 21.3 %   Platelets 173 150 - 400 K/uL  Basic metabolic panel     Status: Abnormal   Collection Time: 04/13/16  4:32 AM  Result Value Ref Range   Sodium 136 135 - 145 mmol/L   Potassium 3.2 (L) 3.5 - 5.1 mmol/L   Chloride 108 101 - 111 mmol/L   CO2 23 22 - 32 mmol/L   Glucose, Bld 114 (H) 65 - 99 mg/dL   BUN 11 6 - 20 mg/dL   Creatinine, Ser 0.86 0.44 - 1.00 mg/dL   Calcium 6.9 (L) 8.9 - 10.3 mg/dL   GFR calc non Af Amer >60 >60 mL/min   GFR calc Af Amer >60 >60 mL/min   Anion gap 5 5 - 15   Dg Chest Port 1 View  Result Date: 04/11/2016 CLINICAL DATA:  82 year old female with respiratory failure EXAM: PORTABLE CHEST 1 VIEW COMPARISON:  Prior chest x-ray 04/09/2016 FINDINGS: The patient has been extubated and the nasogastric tube removed. Inspiratory volumes are lower. Nonspecific patchy opacities in the right greater than left base. Stable cardiomegaly. Mild pulmonary vascular congestion without edema. Atherosclerotic, ectatic and tortuous thoracic aorta. Right IJ approach central venous catheter remains in unchanged position. The tip of the catheter is at the superior  cavoatrial junction. Osseous structures are intact and unremarkable. IMPRESSION: 1. Interval extubation and removal of nasogastric tube. Right IJ central venous catheter remains in stable and satisfactory position. 2. Lower inspiratory volumes with increased bibasilar atelectasis and pulmonary vascular congestion. 3.  Aortic Atherosclerosis (ICD10-170.0) Electronically Signed   By: Malachy Moan M.D.   On: 04/11/2016 08:12   Dg Swallowing Func-speech Pathology  Result Date: 04/12/2016 Objective Swallowing Evaluation: Type of Study: MBS-Modified Barium Swallow Study Patient Details Name: Kianna Billet MRN: [id number] Date of Birth: 22-Mar-1928 Today's Date: 04/12/2016 Time: SLP Start Time (ACUTE ONLY): 1116-SLP Stop Time (ACUTE ONLY): 1131 SLP Time Calculation (min) (ACUTE ONLY): 15 min Past Medical History: Past Medical History: Diagnosis Date . COPD (chronic obstructive pulmonary disease) (HCC)  Past Surgical History: Past Surgical History: Procedure Laterality Date . PELVIC FRACTURE SURGERY   . RADIOLOGY WITH ANESTHESIA N/A 04/09/2016  Procedure: RADIOLOGY WITH ANESTHESIA;  Surgeon: Julieanne Cotton, MD;  Location: [facility] OR;  Service: Radiology;  Laterality: N/A; HPI: Ms Hausner is an 82 yo female with h/o COPD admitted with mental status change, right sided weakness, hemianopsia, left gaze preference,  Pt required intubation and  was extubated yesterday.  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  Swallow and speech eval ordered Subjective: pt awake, pleasant Assessment / Plan / Recommendation CHL IP CLINICAL IMPRESSIONS 04/12/2016 Therapy Diagnosis Moderate pharyngeal phase dysphagia Clinical Impression Pt has a moderate pharyngeal dysphagia due in large part to delayed swallow initiation and decreased sensation of airway penetration. Liquids spill to the pyriform sinuses, resulting in deep, silent penetration of thin and  nectar thick liquids. Although this does not occur with every bolus, it is silent and unable to be completely cleared with cued coughing, making her at a higher risk for an aspiration-related infection. Her strength is adequate with no significant residuals remaining post-swallow. Recommend Dys 3 diet and honey thick liquids. SLP to f/u for tolerance and readiness to advance. Impact on safety and function Moderate aspiration risk   CHL IP TREATMENT RECOMMENDATION 04/12/2016 Treatment Recommendations Therapy as outlined in treatment plan below   Prognosis 04/12/2016 Prognosis for Safe Diet Advancement Good Barriers to Reach Goals -- Barriers/Prognosis Comment -- CHL IP DIET RECOMMENDATION 04/12/2016 SLP Diet Recommendations Dysphagia 3 (Mech soft) solids;Honey thick liquids Liquid Administration via Cup;No straw Medication Administration Whole meds with puree Compensations Slow rate;Small sips/bites Postural Changes Remain semi-upright after after feeds/meals (Comment);Seated upright at 90 degrees   CHL IP OTHER RECOMMENDATIONS 04/12/2016 Recommended Consults -- Oral Care Recommendations Oral care BID Other Recommendations Order thickener from pharmacy;Prohibited food (jello, ice cream, thin soups);Remove water pitcher   CHL IP FOLLOW UP RECOMMENDATIONS 04/12/2016 Follow up Recommendations Inpatient Rehab   CHL IP FREQUENCY AND DURATION 04/12/2016 Speech Therapy Frequency (ACUTE ONLY) min 2x/week Treatment Duration 2 weeks      CHL IP ORAL PHASE 04/12/2016 Oral Phase WFL Oral - Pudding Teaspoon -- Oral - Pudding Cup -- Oral - Honey Teaspoon -- Oral - Honey Cup -- Oral - Nectar Teaspoon -- Oral - Nectar Cup -- Oral - Nectar Straw -- Oral - Thin Teaspoon -- Oral - Thin Cup -- Oral - Thin Straw -- Oral - Puree -- Oral - Mech Soft -- Oral - Regular -- Oral - Multi-Consistency -- Oral - Pill -- Oral Phase - Comment --  CHL IP PHARYNGEAL PHASE 04/12/2016 Pharyngeal Phase Impaired Pharyngeal- Pudding Teaspoon -- Pharyngeal  -- Pharyngeal- Pudding Cup -- Pharyngeal -- Pharyngeal- Honey Teaspoon -- Pharyngeal -- Pharyngeal- Honey Cup Delayed swallow initiation-pyriform sinuses Pharyngeal -- Pharyngeal- Nectar Teaspoon -- Pharyngeal -- Pharyngeal- Nectar Cup Delayed swallow initiation-pyriform sinuses;Penetration/Aspiration before swallow Pharyngeal Material enters airway, remains ABOVE vocal cords and not ejected out Pharyngeal- Nectar Straw -- Pharyngeal -- Pharyngeal- Thin Teaspoon -- Pharyngeal -- Pharyngeal- Thin Cup Delayed swallow initiation-pyriform sinuses;Penetration/Aspiration before swallow Pharyngeal Material enters airway, CONTACTS cords and not ejected out Pharyngeal- Thin Straw -- Pharyngeal -- Pharyngeal- Puree Delayed swallow initiation-vallecula Pharyngeal -- Pharyngeal- Mechanical Soft Delayed swallow initiation-vallecula Pharyngeal -- Pharyngeal- Regular -- Pharyngeal -- Pharyngeal- Multi-consistency -- Pharyngeal -- Pharyngeal- Pill -- Pharyngeal -- Pharyngeal Comment --  CHL IP CERVICAL ESOPHAGEAL PHASE 04/12/2016 Cervical Esophageal Phase WFL Pudding Teaspoon -- Pudding Cup -- Honey Teaspoon -- Honey Cup -- Nectar Teaspoon -- Nectar Cup -- Nectar Straw -- Thin Teaspoon -- Thin Cup -- Thin Straw -- Puree -- Mechanical Soft -- Regular -- Multi-consistency -- Pill -- Cervical Esophageal Comment -- No flowsheet data found. [name] 04/12/2016, 1:05 PM  [name], M.A. CCC-SLP [phone]  Assessment/Plan: Diagnosis: Scattered acute infarct left hemisphere primarily in the left temporal occipital lobes, bilateral MCA and left cerebellar artery territories Labs and images independently reviewed.  Records reviewed and summated above. Stroke: Continue secondary stroke prophylaxis and Risk Factor Modification listed below:   Antiplatelet therapy:   Blood Pressure Management:  Continue current medication with prn's with permisive HTN per primary team Statin Agent:   Prediabetes  management:    1. Does the need for close, 24 hr/day medical supervision in concert with the patient's rehab needs make it unreasonable for this patient to be served in a less intensive setting? No  2. Co-Morbidities requiring supervision/potential complications: complete revascularization of occluded left MCA, left ACA and left ICA terminus, post-stroke dysphagia (advance diet as tolerated), Grade 1 diastolic dysfunction (monitor weights), COPD (monitor O2 sats and RR with increased activity), leukocytosis (cont to monitor for signs and symptoms of infection, further workup if indicated), ABLA (transfuse if necessary to ensure appropriate perfusion for increased activity tolerance), hypokalemia (continue to monitor and replete as necessary) 3. Due to safety, disease management and patient education, does the patient require 24 hr/day rehab nursing? Potentially 4. Does the patient require coordinated care of a physician, rehab nurse, PT (1-2 hrs/day, 5 days/week), OT (1-2 hrs/day, 5 days/week) and SLP (1-2 hrs/day, 5 days/week) to address physical and functional deficits in the context of the above medical diagnosis(es)? Potentially Addressing deficits in the following areas: balance, endurance, locomotion, strength, transferring, toileting, swallowing and psychosocial support 5. Can the patient actively participate in an intensive therapy program of at least 3 hrs of therapy per day at least 5 days per week? Yes 6. The potential for patient to make measurable gains while on inpatient rehab is fair 7. Anticipated functional outcomes upon discharge from inpatient rehab are n/a  with PT, n/a with OT, n/a with SLP. 8. Estimated rehab length of stay to reach the above functional goals is: NA 9. Does the patient have adequate social supports and living environment to accommodate these discharge functional goals? N/A 10. Anticipated D/C setting: Home 11. Anticipated post D/C treatments: HH therapy and Home  excercise program 12. Overall Rehab/Functional Prognosis: good  RECOMMENDATIONS: This patient's condition is appropriate for continued rehabilitative care in the following setting: Pt states she is at baseline, able to sit/stand, transfer with supervision.  At present, pt does not appear to require CIR.  Recommend home with [facility] and supervision, if supervision, not available, may consider SNF with PM&R follow up as outpt. Patient has agreed to participate in recommended program. Potentially Note that insurance prior authorization may be required for reimbursement for recommended care.  Comment: Rehab Admissions Coordinator to follow up.  Maryla Morrow, MD, [place] [facility] 04/13/2016

## 2016-04-13 NOTE — Progress Notes (Signed)
OT Cancellation Note  Patient Details Name: Catherine ClossRegina Day MRN: [id number] DOB: 07/31/1927   Cancelled Treatment:    Reason Eval/Treat Not Completed: Patient declined, no reason specified. Will continue to follow. Pt for TEE later today.  Evern BioMayberry, Srihith Aquilino Lynn 04/13/2016, 12:53 PM  [phone]

## 2016-04-13 NOTE — Progress Notes (Signed)
STROKE TEAM PROGRESS NOTE   SUBJECTIVE (INTERVAL HISTORY) Patient is sitting up in a bedside chair. She states her speech has improved back to baseline. She is awaiting TEE and loop recorder today.   OBJECTIVE Temp:  [98.4 F (36.9 C)-99.2 F (37.3 C)] 98.4 F (36.9 C) (11/27 0552) Pulse Rate:  [74-135] 83 (11/27 0552) Cardiac Rhythm: Normal sinus rhythm (11/27 0817) Resp:  [18-28] 24 (11/27 0552) BP: (131-205)/(51-96) 131/51 (11/27 0552) SpO2:  [91 %-99 %] 97 % (11/27 0800)  CBC:  Recent Labs Lab 04/09/16 1827  04/10/16 0636  04/12/16 0220 04/13/16 0432  WBC 12.9*  --  16.6*  < > 10.3 11.1*  NEUTROABS 11.7*  --  14.1*  --   --   --   HGB 14.5  < > 12.1  < > 10.8* 9.9*  HCT 44.1  < > 37.6  < > 34.6* 31.2*  MCV 83.8  --  84.1  < > 86.1 84.6  PLT 168  --  142*  < > 163 173  < > = values in this interval not displayed.  Basic Metabolic Panel:   Recent Labs Lab 04/12/16 0220 04/13/16 0432  NA 137 136  K 3.6 3.2*  CL 108 108  CO2 19* 23  GLUCOSE 69 114*  BUN 10 11  CREATININE 0.63 0.67  CALCIUM 6.9* 6.9*    Lipid Panel:     Component Value Date/Time   CHOL 159 04/10/2016 0637   TRIG 76 04/10/2016 0637   HDL 52 04/10/2016 0637   CHOLHDL 3.1 04/10/2016 0637   VLDL 15 04/10/2016 0637   LDLCALC 92 04/10/2016 0637   HgbA1c:  Lab Results  Component Value Date   HGBA1C 5.8 (H) 04/10/2016   Urine Drug Screen: No results found for: LABOPIA, COCAINSCRNUR, LABBENZ, AMPHETMU, THCU, LABBARB    IMAGING Dg Swallowing Func-speech Pathology  Result Date: 04/12/2016 Objective Swallowing Evaluation: Type of Study: MBS-Modified Barium Swallow Study Patient Details Name: Catherine Day MRN: [id number] Date of Birth: 03/04/1928 Today's Date: 04/12/2016 Time: SLP Start Time (ACUTE ONLY): 1116-SLP Stop Time (ACUTE ONLY): 1131 SLP Time Calculation (min) (ACUTE ONLY): 15 min Past Medical History: Past Medical History: Diagnosis Date . COPD (chronic obstructive pulmonary disease)  (HCC)  Past Surgical History: Past Surgical History: Procedure Laterality Date . PELVIC FRACTURE SURGERY   . RADIOLOGY WITH ANESTHESIA N/A 04/09/2016  Procedure: RADIOLOGY WITH ANESTHESIA;  Surgeon: Julieanne CottonSanjeev Deveshwar, MD;  Location: [facility] OR;  Service: Radiology;  Laterality: N/A; HPI: Ms Catherine Day is an 82 yo female with h/o COPD admitted with mental status change, right sided weakness, hemianopsia, left gaze preference,  Pt required intubation and was extubated yesterday.  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  Swallow and speech eval ordered Subjective: pt awake, pleasant Assessment / Plan / Recommendation CHL IP CLINICAL IMPRESSIONS 04/12/2016 Therapy Diagnosis Moderate pharyngeal phase dysphagia Clinical Impression Pt has a moderate pharyngeal dysphagia due in large part to delayed swallow initiation and decreased sensation of airway penetration. Liquids spill to the pyriform sinuses, resulting in deep, silent penetration of thin and nectar thick liquids. Although this does not occur with every bolus, it is silent and unable to be completely cleared with cued coughing, making her at a higher risk for an aspiration-related infection. Her strength is adequate with no significant residuals remaining post-swallow. Recommend Dys 3 diet and honey thick liquids. SLP to f/u for tolerance and readiness to advance.  Impact on safety and function Moderate aspiration risk   CHL IP TREATMENT RECOMMENDATION 04/12/2016 Treatment Recommendations Therapy as outlined in treatment plan below   Prognosis 04/12/2016 Prognosis for Safe Diet Advancement Good Barriers to Reach Goals -- Barriers/Prognosis Comment -- CHL IP DIET RECOMMENDATION 04/12/2016 SLP Diet Recommendations Dysphagia 3 (Mech soft) solids;Honey thick liquids Liquid Administration via Cup;No straw Medication Administration Whole meds with puree Compensations Slow rate;Small  sips/bites Postural Changes Remain semi-upright after after feeds/meals (Comment);Seated upright at 90 degrees   CHL IP OTHER RECOMMENDATIONS 04/12/2016 Recommended Consults -- Oral Care Recommendations Oral care BID Other Recommendations Order thickener from pharmacy;Prohibited food (jello, ice cream, thin soups);Remove water pitcher   CHL IP FOLLOW UP RECOMMENDATIONS 04/12/2016 Follow up Recommendations Inpatient Rehab   CHL IP FREQUENCY AND DURATION 04/12/2016 Speech Therapy Frequency (ACUTE ONLY) min 2x/week Treatment Duration 2 weeks      CHL IP ORAL PHASE 04/12/2016 Oral Phase WFL Oral - Pudding Teaspoon -- Oral - Pudding Cup -- Oral - Honey Teaspoon -- Oral - Honey Cup -- Oral - Nectar Teaspoon -- Oral - Nectar Cup -- Oral - Nectar Straw -- Oral - Thin Teaspoon -- Oral - Thin Cup -- Oral - Thin Straw -- Oral - Puree -- Oral - Mech Soft -- Oral - Regular -- Oral - Multi-Consistency -- Oral - Pill -- Oral Phase - Comment --  CHL IP PHARYNGEAL PHASE 04/12/2016 Pharyngeal Phase Impaired Pharyngeal- Pudding Teaspoon -- Pharyngeal -- Pharyngeal- Pudding Cup -- Pharyngeal -- Pharyngeal- Honey Teaspoon -- Pharyngeal -- Pharyngeal- Honey Cup Delayed swallow initiation-pyriform sinuses Pharyngeal -- Pharyngeal- Nectar Teaspoon -- Pharyngeal -- Pharyngeal- Nectar Cup Delayed swallow initiation-pyriform sinuses;Penetration/Aspiration before swallow Pharyngeal Material enters airway, remains ABOVE vocal cords and not ejected out Pharyngeal- Nectar Straw -- Pharyngeal -- Pharyngeal- Thin Teaspoon -- Pharyngeal -- Pharyngeal- Thin Cup Delayed swallow initiation-pyriform sinuses;Penetration/Aspiration before swallow Pharyngeal Material enters airway, CONTACTS cords and not ejected out Pharyngeal- Thin Straw -- Pharyngeal -- Pharyngeal- Puree Delayed swallow initiation-vallecula Pharyngeal -- Pharyngeal- Mechanical Soft Delayed swallow initiation-vallecula Pharyngeal -- Pharyngeal- Regular -- Pharyngeal -- Pharyngeal-  Multi-consistency -- Pharyngeal -- Pharyngeal- Pill -- Pharyngeal -- Pharyngeal Comment --  CHL IP CERVICAL ESOPHAGEAL PHASE 04/12/2016 Cervical Esophageal Phase WFL Pudding Teaspoon -- Pudding Cup -- Honey Teaspoon -- Honey Cup -- Nectar Teaspoon -- Nectar Cup -- Nectar Straw -- Thin Teaspoon -- Thin Cup -- Thin Straw -- Puree -- Mechanical Soft -- Regular -- Multi-consistency -- Pill -- Cervical Esophageal Comment -- No flowsheet data found. [name] 04/12/2016, 1:05 PM  [name], M.A. CCC-SLP [phone]               PHYSICAL EXAM General - Well nourished, well developed,  Ophthalmologic - Fundi not visualized   Cardiovascular - Regular rate and rhythm. Neuro - , alert, Oriented to self only. Alert.  Speech is fluent. No aphasia. . Slight disorientation follows all simple midline and peripheral commands. Eyes in mid position, able to attend both sides, PERRL, impaired upgaze otherwise EOMI, facial symmetric. Moves all extremities symmetrically 4/5 proximal and distally.DTR 1+ bilaterally and no babinski. Sensation symmetrical although limited testing due impaired cognition. Slow FTN but symmetrical. Gait not tested.    ASSESSMENT/PLAN Catherine Day is a 82 y.o. female with history of COPD and hypertension presenting with sudden onset of right sided weakness and inability to speak.   She received IV t-PA and underwent mechanical thrombectomy with complete revascularization of occluded LT MCA ,Lt ACA and Lt ICA terminus.  Stroke:  Dominant left brain infarct s/p tPA and revascularization of occluded LT MCA ,Lt ACA and Lt ICA terminus, embolic pattern secondary to likely cardioembolic source.  Resultant  Rapid neuro improvement,   MRI - Small volume of scattered acute infarcts in the left hemisphere. petechial hemorrhage in the left mesial temporal lobe. A small number of other scattered small infarcts in the bilateral MCA and left cerebellar artery territories   MRA -  Possible occlusion of the left PCA P3 inferior division.  Cerebral angiogram - complete revascularization of occluded LT MCA ,Lt ACA and Lt ICA terminus  2D Echo - Systolic function was probably mildly reduced. No cardiac source of emboli identified.  TEE and loop recorder depending on further stroke work up   LDL - 92  HgbA1c - 5.8  VTE prophylaxis - SCDs Diet NPO time specified Except for: Sips with Meds  No antithrombotic prior to admission, now on Aspirin 325 mg daily will monitor  Ongoing aggressive stroke risk factor management  Therapy recommendations: Inpatient rehabilitation consultation recommended -> ordered  Disposition:  Pending  Hypertension  Stable  BP goal 120 - 160 post mechanical thrombectomy  Long-term BP goal normotensive  Hyperlipidemia  Home meds:  No lipid lowering medications prior to admission  LDL 92, goal < 70  Add Lipitor 20 mg daily  Continue statin at discharge  Other Stroke Risk Factors  Advanced age  Other Active Problems  Contrast extravasation left arm after IV infiltrated.  Leukocytosis - 16.6  -> 15.1 (afebrile) -> 10.3  Hypokalemia - 3.3 -> 3.6 - continue to monitor  Anemia - 10.8 / 34.6 - continue to monitor - check stool guaiacs and iron levels  PLAN   Mobilize out of bed. TEE and loop recorder today. Transfer to inpatient rehabilitation in the next few days when bed available.  Hospital day # 4  I have personally examined this patient, reviewed notes, independently viewed imaging studies, participated in medical decision making and plan of care.ROS completed by me personally and pertinent positives fully documented  I have made any additions or clarifications directly to the above note. Greater than 50% time during this 25 minute visit was spent on counseling and coordination of care about stroke risk, prevention and treatment  Delia HeadyPramod Sethi, MD Medical Director [facility] Pager:  [phone] 04/13/2016 3:04 PM   To contact Stroke Continuity provider, please refer to [email or web address]. After hours, contact General Neurology

## 2016-04-13 NOTE — Progress Notes (Signed)
ELECTROPHYSIOLOGY CONSULT NOTE  Patient ID: Catherine Day MRN: [id number], DOB/AGE: 05-30-1927   Admit date: 04/09/2016 Date of Consult: 04/13/2016  Primary Physician: No primary care provider on file. Primary Cardiologist: none Reason for Consultation: Cryptogenic stroke ; recommendations regarding Implantable Loop Recorder  History of Present Illness Catherine Day was admitted on 04/09/2016 with acute CVA.  HPI/PMHx is obtained mainly from the chart, patient is a poor historian, with no clear details of her hospitalization.  She developed sudden onset of right sided weakness and aphasia, treated with tPA, and IR procedure as noted below with intervention to the LT MCA ,Lt ACA and Lt ICA terminus.  They first developed symptoms while at home.  PMHx noted for COPD.  Imaging demonstrated MRI - Small volume of scattered acute infarcts in the left hemisphere, neuro notes; Dominant left brain infarct s/p tPA and revascularization of occluded LT MCA ,Lt ACA and Lt ICA terminus, embolic pattern secondary to likely cardioembolic source.  she has undergone workup for stroke including echocardiogram and carotid angio.  The patient has been monitored on telemetry which has demonstrated sinus rhythm with no arrhythmias.  Inpatient stroke work-up is to be completed with a TEE.   Echocardiogram this admission demonstrated  Study Conclusions  - Left ventricle: The cavity size was normal. Wall thickness was   normal. Systolic function was probably mildly reduced. There was   an increased relative contribution of atrial contraction to   ventricular filling, which may be due to aging or hypovolemia.   Doppler parameters are consistent with abnormal left ventricular   relaxation (grade 1 diastolic dysfunction). Impressions: - even after microbubble contrasrt was administered, LV function   and wall motion evaluation is limited. Recommend an alternative   method for LVEF evaluation   Lab work is  reviewed Potassium deferred to medicine service  Prior to admission, the patient denies chest pain, shortness of breath, dizziness, palpitations, or syncope.  They are recovering from their stroke with plans to CIR at discharge.  EP has been asked to evaluate for placement of an implantable loop recorder to monitor for atrial fibrillation.     Past Medical History:  Diagnosis Date  . COPD (chronic obstructive pulmonary disease) ([place])      Surgical History:  Past Surgical History:  Procedure Laterality Date  . IR GENERIC HISTORICAL  04/09/2016   IR ANGIO INTRA EXTRACRAN SEL COM CAROTID INNOMINATE UNI R MOD SED 04/09/2016 Luanne Bras, MD [facility]  . IR GENERIC HISTORICAL  04/09/2016   IR PERCUTANEOUS ART THROMBECTOMY/INFUSION INTRACRANIAL INC DIAG ANGIO 04/09/2016 Luanne Bras, MD [facility]  . PELVIC FRACTURE SURGERY    . RADIOLOGY WITH ANESTHESIA N/A 04/09/2016   Procedure: RADIOLOGY WITH ANESTHESIA;  Surgeon: Luanne Bras, MD;  Location: [facility];  Service: Radiology;  Laterality: N/A;     Prescriptions Prior to Admission  Medication Sig Dispense Refill Last Dose  . Calcium Citrate-Vitamin D (CITRACAL + D PO) Take 1 tablet by mouth daily.   04/09/2016 at Unknown time  . denosumab (PROLIA) 60 MG/ML SOLN injection Inject 60 mg into the skin every 6 (six) months. Administer in upper arm, thigh, or abdomen Last injection 03/24/16   03/24/2016  . fluticasone furoate-vilanterol (BREO ELLIPTA) 100-25 MCG/INH AEPB Inhale 1 puff into the lungs daily.   04/09/2016 at Unknown time  . ipratropium-albuterol (DUONEB) 0.5-2.5 (3) MG/3ML SOLN Take 3 mLs by nebulization See admin instructions. Inhale 1 vial by nebulization every morning and may also use 2 more  times during the day as needed for shortness of breath or wheezing   04/09/2016 at am  . levalbuterol [facility] HFA) 45 MCG/ACT inhaler Inhale 1-2 puffs into the lungs every 4 (four) hours as needed for wheezing or shortness  of breath.   >6 months ago  . methylPREDNISolone (MEDROL DOSEPAK) 4 MG TBPK tablet Take 4-8 mg by mouth See admin instructions. Tapered course started 04/09/16 am -Day 1: take 2 tablets (8 mg) by mouth with breakfast, 1 tablet (4 mg) with lunch and supper and 2 tablets at bedtime; Day 2: take 1 tablet with breakfast, lunch and supper and 2 tablets at bedtime; Day 3: take 1 tablet with breakfast, lunch, supper and at bedtime; Day 4: take 1 tablet with breakfast, lunch and at bedtime; Day 5: take 1 tablet with breakfast and at bedtime; Day 6: take 1 tablet with breakfast  0 04/09/2016 at am  . OXYGEN Inhale 2 L into the lungs continuous.   04/09/2016 at Unknown time  . tiotropium (SPIRIVA) 18 MCG inhalation capsule Place 18 mcg into inhaler and inhale daily.   04/09/2016 at am  . tobramycin, PF, (TOBI) 300 MG/5ML nebulizer solution Take 300 mg by nebulization daily.   04/09/2016 at Unknown time  . valsartan-hydrochlorothiazide (DIOVAN-HCT) 80-12.5 MG tablet Take 1 tablet by mouth daily.   maybe 11/23    Inpatient Medications:  . aspirin  325 mg Oral Daily  . atorvastatin  20 mg Oral q1800  . fluticasone furoate-vilanterol  1 puff Inhalation Daily  . levalbuterol  0.63 mg Nebulization TID  . pantoprazole (PROTONIX) IV  40 mg Intravenous QHS  . tiotropium  18 mcg Inhalation Daily  . tobramycin (PF)  300 mg Nebulization Daily    Allergies: No Known Allergies  Social History   Social History  . Marital status: Married    Spouse name: N/A  . Number of children: N/A  . Years of education: N/A   Occupational History  . Not on file.   Social History Main Topics  . Smoking status: Never Smoker  . Smokeless tobacco: Never Used  . Alcohol use No  . Drug use: No  . Sexual activity: Not on file   Other Topics Concern  . Not on file   Social History Narrative  . No narrative on file     Family History: The patient is a poor historian, denies any known family medical history    Review  of Systems: Patient denies any complaints of any kind at this time  Physical Exam: Vitals:   04/12/16 2226 04/13/16 0135 04/13/16 0552 04/13/16 0800  BP:  (!) 134/58 (!) 131/51   Pulse:  98 83   Resp:  (!) 24 (!) 24   Temp:  98.4 F (36.9 C) 98.4 F (36.9 C)   TempSrc:  Oral Oral   SpO2: 97% 98% 99% 97%  Weight:      Height:        GEN- The patient is well appearing, alert and oriented x to self only.   Head- normocephalic, atraumatic Eyes-  Sclera clear, conjunctiva pink Ears- hearing intact Oropharynx- clear Neck- supple Lungs- mid-end exp wheezes b/l, normal work of breathing Heart- Regular rate and rhythm, no murmurs, rubs or gallops  GI- soft, NT, ND Extremities- no clubbing, cyanosis, or edema MS- no significant deformity or atrophy Skin- no rash or lesion Psych- euthymic mood, full affect   Labs:   Lab Results  Component Value Date   WBC 11.1 (H)  04/13/2016   HGB 9.9 (L) 04/13/2016   HCT 31.2 (L) 04/13/2016   MCV 84.6 04/13/2016   PLT 173 04/13/2016    Recent Labs Lab 04/09/16 1827  04/13/16 0432  NA 131*  < > 136  K 3.8  < > 3.2*  CL 99*  < > 108  CO2 23  < > 23  BUN 23*  < > 11  CREATININE 0.91  < > 0.67  CALCIUM 8.5*  < > 6.9*  PROT 7.5  --   --   BILITOT 0.7  --   --   ALKPHOS 61  --   --   ALT 17  --   --   AST 22  --   --   GLUCOSE 231*  < > 114*  < > = values in this interval not displayed. No results found for: CKTOTAL, CKMB, CKMBINDEX, TROPONINI Lab Results  Component Value Date   CHOL 159 04/10/2016   Lab Results  Component Value Date   HDL 52 04/10/2016   Lab Results  Component Value Date   LDLCALC 92 04/10/2016   Lab Results  Component Value Date   TRIG 76 04/10/2016   Lab Results  Component Value Date   CHOLHDL 3.1 04/10/2016   No results found for: LDLDIRECT  No results found for: DDIMER   Radiology/Studies:  Ct Head Wo Contrast Result Date: 04/09/2016 CLINICAL DATA:  82 y/o F; code stroke post transarterial  intervention. EXAM: CT HEAD WITHOUT CONTRAST TECHNIQUE: Contiguous axial images were obtained from the base of the skull through the vertex without intravenous contrast. COMPARISON:  04/09/2016 CT head. FINDINGS: Brain: No large territory acute infarct or intracranial hemorrhage identified. Patchy foci of hypoattenuation in periventricular and subcortical white matter may represent areas of infarction or chronic microvascular ischemic changes and are stable. Mild parenchymal volume loss. No focal mass effect. No extra-axial collection. No effacement of basilar cisterns. Vascular: Persistent opacification from previous contrast administration. Calcific atherosclerosis of carotid siphons. Skull: Normal. Negative for fracture or focal lesion. Sinuses/Orbits: Partial opacification of ethmoid and sphenoid sinuses with aerosolized secretions and partial opacification of mastoid air cells probably related to intubation. Orbits are unremarkable. Other: None. IMPRESSION: No large territory acute infarct or intracranial hemorrhage identified. Patchy foci of hypoattenuation in periventricular and subcortical white matter may represent areas of infarction or chronic microvascular ischemic changes and are stable. If clinically indicated MRI is more sensitive for acute ischemia. Electronically Signed   By: Kristine Garbe M.D.   On: 04/09/2016 22:51   Mr Virgel [place] Contrast Result Date: 04/10/2016 CLINICAL DATA:  82 year old female status post neuro intervention on 04/09/2016 for acute onset right side weakness and inability to speak. Status post Bilateral cerebral angiogram with Revascularization of occluded LT MCA ,Lt ACA and Lt ICA terminus with x1 PASS WITH A COMBINATION OF solitaire 72m x 40 mm retrieval device with simultaneous aspiration at the intermediate guide catheter and flow gate guide catheter, and 3.6 mg of superselective intracranial INTEGRELIN. Initial encounter. EXAM: MRI HEAD WITHOUT CONTRAST MRA  HEAD WITHOUT CONTRAST TECHNIQUE: Multiplanar, multiecho pulse sequences of the brain and surrounding structures were obtained without intravenous contrast. Angiographic images of the head were obtained using MRA technique without contrast. COMPARISON:  Post tPA head CT 04/09/2016 FINDINGS: MRI HEAD FINDINGS Brain: Multiple small cortically based and white-matter foci of restricted diffusion in the left temporal lobe, best seen on axial diffusion weighted imaging (series 4, image 20). Evidence of petechial hemorrhage in the  left mesial temporal lobe which appears to track to the left cauda thalamic groove where diffusion is also heterogeneous. These areas demonstrate mild T2 and FLAIR hyperintensity without mass effect. In addition there are several other widely scattered mostly cortically based foci of restricted diffusion in both MCA territories at over the superior convexities. Similar scattered small infarcts in the left occipital lobe. One or 2 such areas in the left cerebellum. No other intracranial hemorrhage. No intracranial mass effect or ventriculomegaly. Superimposed patchy bilateral cerebral white matter T2 and FLAIR hyperintensity. No chronic cortical encephalomalacia. Right deep gray matter nuclei and brainstem are normal for age. Negative pituitary and cervicomedullary junction. Vascular: Major intracranial vascular flow voids are preserved. Skull and upper cervical spine: Negative. Normal bone marrow signal. Sinuses/Orbits: Normal orbits soft tissues. Trace paranasal sinus mucosal thickening. Mild mastoid effusions. Other: Negative scalp soft tissues. MRA HEAD FINDINGS Mildly motion degraded. Antegrade flow in the posterior circulation with mildly dominant distal right vertebral artery. Normal PICA origins. Patent vertebrobasilar junction. Patent basilar artery without stenosis. Patent SCA and normal right PCA origins. Fetal type left PCA origin. Right posterior communicating artery diminutive or  absent. Absent flow signal in the inferior left PCA P3 division. Otherwise the bilateral PCA branches appear normal. Antegrade flow in both ICA siphons with some generalized ICA dolichoectasia and tortuosity of the distal cervical right ICA. Patent carotid termini. Normal MCA and ACA origins. Generalized ACA dolichoectasia. Proximal ACA branches appear normal. Both MCA bifurcations appear patent. MCA branch detail is degraded by motion. Fairly symmetric appearance of bilateral MCA branch flow signal. IMPRESSION: 1. Small volume of scattered acute infarcts in the left hemisphere primarily in the left temporal and occipital lobes (note fetal type left PCA origin anatomy). 2. Petechial hemorrhage in the mesial left temporal lobe tracking to the cauda thalamic groove. No associated mass effect and no malignant hemorrhagic transformation. 3. A small number of other scattered small infarcts in the bilateral MCA and left cerebellar artery territories might be related to endovascular intervention. 4. Motion degraded intracranial MRA negative for emergent large vessel occlusion or proximal intracranial stenosis. Possible occlusion of the left PCA P3 inferior division. Electronically Signed   By: Genevie Ann M.D.   On: 04/10/2016 19:16   Dg Chest Port 1 View Result Date: 04/11/2016 CLINICAL DATA:  82 year old female with respiratory failure EXAM: PORTABLE CHEST 1 VIEW COMPARISON:  Prior chest x-ray 04/09/2016 FINDINGS: The patient has been extubated and the nasogastric tube removed. Inspiratory volumes are lower. Nonspecific patchy opacities in the right greater than left base. Stable cardiomegaly. Mild pulmonary vascular congestion without edema. Atherosclerotic, ectatic and tortuous thoracic aorta. Right IJ approach central venous catheter remains in unchanged position. The tip of the catheter is at the superior cavoatrial junction. Osseous structures are intact and unremarkable. IMPRESSION: 1. Interval extubation and  removal of nasogastric tube. Right IJ central venous catheter remains in stable and satisfactory position. 2. Lower inspiratory volumes with increased bibasilar atelectasis and pulmonary vascular congestion. 3.  Aortic Atherosclerosis (ICD10-170.0) Electronically Signed   By: Jacqulynn Cadet M.D.   On: 04/11/2016 08:12     Ir Percutaneous Art Thrombectomy/infusion Intracranial Inc Diag Angio Result Date: 04/13/2016 CLINICAL DATA:  Right-sided hemiplegia, left gaze preference, confusion, global aphasia. EXAM: IR PERCUTANEOUS ART THORMBECTOMY/INFUSION INTRACRANIAL INCLUDE DIAG ANGIO; IR ANGIO INTRA EXTRACRAN SEL COM CAROTID INNOMINATE UNI RIGHT MOD SED PROCEDURE: Following a full explanation of the procedure along with the potential associated complications, an informed witnessed consent was obtained. Risks of  intracranial hemorrhage of 10-15%, worsening neurological deficit, death, and the inability to revascularize were all reviewed in detail with the patient's daughter. Informed consent was obtained. The patient was then put under general anesthesia by the Department of Anesthesiology at [facility]. The right groin was prepped and draped in the usual sterile fashion. Thereafter using modified Seldinger technique, transfemoral access into the right common femoral artery was obtained without difficulty. Over a 0.035 inch guidewire a 5 French Pinnacle sheath was inserted. Through this, and also over a 0.035 inch guidewire a 5 [place] JB 1 catheter was advanced to the aortic arch region and selectively positioned in the left common carotid artery and later in the right common carotid artery. There were no acute complications. The patient tolerated the procedure well. Contrast: Isovue 300 approximately 70 mL. Anesthesia/Sedation:  General anesthesia. Medications: As per general anesthesia. FINDINGS: The right common carotid arteriogram demonstrates moderate to severe tortuosity of the left common carotid  artery in its proximal portion with acute angular takeoff towards the aortic arch. Slow flow was seen ascending to the left internal carotid artery. There was no angiographic visualization of the left external carotid artery consistent with occlusion. The left internal carotid artery at the bulb demonstrated no occlusions with wide patency. 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 The right common carotid arteriogram demonstrates the right external carotid artery and its major branches to be widely patent. The right internal carotid at the bulb and just distally appears widely patent. Focal areas of focal outpouching are seen to emanate from the posterior wall of the right internal carotid artery in the proximal one-third. Distal to this there is a double U shaped tortuosity of the mid left internal carotid artery. Distal to this the left internal carotid artery is seen to opacify normally to the cranial skull base. There is mild fusiform prominence of the petrous cavernous junction extending into the proximal cavernous artery. The distal cavernous carotid and supraclinoid segments are widely patent. There is a mild focal bulge noted at the level of the right posterior communicating artery. The right middle cerebral artery and the right anterior cerebral artery opacify normally into the capillary and venous phases. ENDOVASCULAR COMPLETE REVASCULARIZATION OF OCCLUDED LEFT INTERNAL CAROTID ARTERY TERMINUS, THE LEFT MIDDLE CEREBRAL ARTERY AND LEFT ANTERIOR CEREBRAL ARTERY. The diagnostic JB 1 catheter in the left common carotid artery was then exchanged over a 0.035 inch 300 cm Rosen exchange guidewire for a 8 French 55 cm Brite tip neurovascular sheath using biplane roadmap technique and constant fluoroscopic guidance. Good aspiration obtained from the side port of the neurovascular  sheath. This was then connected to continuous heparinized saline infusion. Over the [facility] exchange guidewire the distal end of which was at the origin of the left external carotid artery an 8 French 85 cm FlowGate balloon guide catheter which had been prepped with 50% contrast and 50% heparinized saline infusion was then advanced to just the origin of left internal carotid artery. The guidewire was removed. Good aspiration was obtained from the hub of the 8 [place] FlowGate guide catheter. Gentle contrast injection demonstrated no evidence of dissections or of intraluminal filling defects. Over a 0.014 inch Softip Synchro micro guidewire, a 021 Trevo ProVue micro catheter was then advanced to just distal to the origin of the 85 cm 8 [place] FlowGate guide catheter. The micro catheter and micro  guidewire were advanced without difficulty to the cranial skull base. However, further advancement of the micro catheter was met with herniation of the entire platform into the aortic arch. The entire system was then removed and replaced with a 8 French 80 cm Arrow neurovascular sheath which was positioned over an exchange micro guidewire just proximal to the origin of the left internal carotid artery. Over the [employer] guidewire, the 8 [place] 80 cm Arrow neurovascular sheath, the 8 French 85 cm FlowGate guide catheter was then advanced and positioned just proximal to the left internal carotid artery origin. The guidewire was removed. Good aspiration obtained from the hub of the 8 [place] FlowGate guide catheter. Gentle contrast injection demonstrated no evidence of spasms, dissections or of intraluminal filling defects. 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 Over a 0.014 inch Softip Synchro micro guidewire which had a J-tip configuration, the combination of an intermediary 6 French 132 cm Catalyst guide catheter inside of  which was a Trevo ProVue micro catheter, the combination was advanced without difficulty to the supraclinoid left ICA. The micro guidewire was then gently advanced through the occluded left middle cerebral artery into the M2 M3 regions of the inferior division followed by the micro catheter and also followed by the 6 French 32 cm Catalyst guide catheter into the proximal left M1 segment. The micro guidewire was then removed. Aspiration was obtained from the hub of the micro catheter. Approximately 3.6 mg of arterial Integrilin were infused 1.8 mg through the micro catheter distally, and 1.8 mg through the 6 [place] Catalyst guide catheter. At this time, a 4 mm x 40 mm Solitaire FR retrieval device which had been prepped and purged with heparinized saline infusion was advanced to the distal end of the micro catheter in a coaxial manner and with constant heparinized saline infusion. The O rings on delivery micro catheter were then loosened. With slight forward gentle traction with the right hand on the delivery micro guidewire, with the left hand the delivery micro catheter was retrieved until it had been deployed. At this time, the 6 [place] Catalyst 132 cm guide catheter was advanced to just inside the proximal portion of the retrieval device. The occlusion balloon at the origin of the left internal carotid artery was then expanded. Also a 20 mL syringe was hooked to the hub of the 6 [place] Catalyst guide catheter and a constant aspiration was then applied. Thereon after, the combination of the retrieval device, with the micro catheter and the 6 [place] guide catheter were then gently retrieved with constant fluoroscopic guidance and removed. Aspiration was continued with a 60 mL syringe at the hub of the 8 [place] FlowGate guide catheter which was then gently deflated. Free back bleed at the hub of the 8 [place] FlowGate guide catheter was achieved. The aspirate contained multiple focal chunks of grayish bloody  thrombus. Similar thrombus was also noted within the 8 [place] Catalyst guide catheter. Clots were also noted in the interstices of the retrieval device. After having obtained free aspiration of blood at the hub of the 8 [place] FlowGate guide catheter, control arteriogram performed through the [facility] guide catheter in the left internal carotid artery demonstrated brisk antegrade flow to the cranial skull base and into the left middle cerebral artery and left anterior cerebral artery distributions to the distal distributions. No angiographic filling defects or occlusions were seen. There was no  evidence of spasm either. No mass-effect or midline shift was noted of the major vessels intracranially. The patient's hemodynamic and neurologic status remained stable. A TICI 3 reperfusion had been obtained. The 8 [place] FlowGate guide catheter and the 8 French 80 cm Arrow neurovascular sheath were then retrieved into the abdominal aorta and exchanged over a J-tiip guidewire for a 9 [place] Pinnacle sheath. This in turn was then connected to continuous heparinized saline infusion. The patient's distal pulses remained unchanged Dopplerable early compared to prior to the procedure. The right groin appeared soft as did the left groin at the site of the applied sheath. The patient was transported to the CT scanner for postprocedural CT scan of brain. IMPRESSION: Status post endovascular complete revascularization of occluded left internal carotid artery terminus, the left middle cerebral artery and left anterior cerebral artery with 1 pass with the Solitaire 4 mm x 40 mm retrieval device in combination with aspiration of the hub of the intermediary catheter and proximal flow arrest, achieving a TICI 3 reperfusion as described above. Electronically Signed   By: Luanne Bras M.D.   On: 04/10/2016 13:15      12-lead ECG ordered No historical EKG's in EPIC  Telemetry SR, ST, no arrhythmias noted  Assessment and  Plan:  1. Cryptogenic stroke The patient presents with cryptogenic stroke.  The patient has a TEE planned for this afternoon.  I spoke at length with the patient about monitoring for afib with either a 30 day event monitor or an implantable loop recorder.  Risks, benefits, and alteratives to implantable loop recorder were discussed with the patient today though doubt her understanding of the details and understanding of her current medical issue.  Would not recommend loop implant at this time, should her cognition improve could re-visit implant.     Baldwin [place], PA-C 04/13/2016   Patient seen and examined. She has been living relatively independently with her daughter in [place]. She was visiting here for Thanksgiving. Her mental status has not recovered her physical status has not recovered.  We have discussed the issue of monitoring. As long as she is on inpatient service we would defer. It is a reasonable thing to defer implantation of a monitor until her status is clearly improving as there is still some reasonable likelihood of an untoward outcome related to the stroke  Furthermore, the issue of long-term management is relatively regional. We have been quite aggressive here using implantable monitors in cryptogenic stroke. Other institutions are less so. As I told the family, it is a relatively easy thing to do and we will look to them for guidance as to how they would like to proceed given the caveats noted above  We will follow along

## 2016-04-13 NOTE — Progress Notes (Signed)
  Echocardiogram Echocardiogram Transesophageal has been performed.  Arvil ChacoFoster, Brittay Mogle 04/13/2016, 3:54 PM

## 2016-04-13 NOTE — Progress Notes (Signed)
Occupational Therapy Treatment Patient Details Name: Catherine ClossRegina Day MRN: [id number] DOB: 12/25/1927 Today's Date: 04/13/2016    History of present illness 82 year old female with PMH COPD, HTN, [facility] with sudden onset R sided-weakness and aphasia. MRI brain-- scattered acute infarcts in the left hemisphere primarily in the left temporal and occipital lobes, and Lt cerebellar artery territory. tPa was administered, taken to IR for revascularization of occluded Lt MCA, ACA and ICA terminus. She was electively intubated for the procedure; extubated 11/24.   OT comments  Pt seen with family present. Family stating that pt was left alone with intermittent visits of a paid caregiver while daughter worked. Pt was resistant to showering, but was able to perform without assistance. She ambulated household distances and used a transport chair outside of home. Pt with poor activity tolerance and continues to have cognitive deficits. Family needs pt to have the endurance and mobility to return home to [place] when medically stable. Continue to recommend inpatient rehab.  Follow Up Recommendations  CIR;Supervision/Assistance - 24 hour    Equipment Recommendations       Recommendations for Other Services      Precautions / Restrictions Precautions Precautions: Fall Precaution Comments: on 2L 02 at baseline Restrictions Weight Bearing Restrictions: No       Mobility Bed Mobility               General bed mobility comments: Pt sitting up in recliner with family present when PT arrived.   Transfers Overall transfer level: Needs assistance Equipment used: Rolling walker (2 wheeled) Transfers: Sit to/from Stand Sit to Stand: Min assist         General transfer comment: VC's for hand placement on seated surface for safety. Pt required assist for controlled descent to chair.     Balance Overall balance assessment: Needs assistance Sitting-balance support: Feet  supported;No upper extremity supported Sitting balance-Leahy Scale: Fair     Standing balance support: No upper extremity supported;During functional activity Standing balance-Leahy Scale: Poor Standing balance comment: Requires UE support for safety and balance.                    ADL Overall ADL's : Needs assistance/impaired                 Upper Body Dressing : Minimal assistance;Sitting Upper Body Dressing Details (indicate cue type and reason): front opening gown     Toilet Transfer: Min guard;Ambulation;RW Toilet Transfer Details (indicate cue type and reason): simulated to chair         Functional mobility during ADLs: Minimal assistance;Rolling walker General ADL Comments: Per daughter, pt required a lot of encouragement to shower, but once in the shower, performed by herself.       Vision                     Perception     Praxis      Cognition   Behavior During Therapy: WFL for tasks assessed/performed Overall Cognitive Status: Impaired/Different from baseline Area of Impairment: Safety/judgement;Memory     Memory: Decreased short-term memory    Safety/Judgement: Decreased awareness of safety;Decreased awareness of deficits Awareness:  (pre-intellectual) Problem Solving: Slow processing;Requires verbal cues General Comments: family in room and reports she is not near her baseline.    Extremity/Trunk Assessment               Exercises     Shoulder Instructions  General Comments      Pertinent Vitals/ Pain       Pain Assessment: No/denies pain  Home Living                                      Lives With: D[place]in [place])    Prior Functioning/Environment              Frequency  Min 3X/week        Progress Toward Goals  OT Goals(current goals can now be found in the care plan section)  Progress towards OT goals: Progressing toward goals  Acute Rehab OT Goals Patient Stated  Goal[place] Time For Goal Achievement: 04/26/16 Potential to Achieve Goals: Good  Plan Discharge plan remains appropriate    Co-evaluation      Reason for Co-Treatment: For patient/therapist safety PT goals addressed during session: Mobility/safety with mobility;Balance;Proper use of DME        End of Session Equipment Utilized During Treatment: Gait belt;Oxygen (3L while walking, placed on 2L at end of session)   Activity Tolerance Patient limited by fatigue   Patient Left in chair;with call bell/phone within reach;with chair alarm set;with family/visitor present   Nurse Communication          Time: 1340-1408 OT Time Calculation (min): 28 min  Charges: OT General Charges $OT Visit: 1 Procedure OT Treatments $Therapeutic Activity: 8-22 mins  Evern BioMayberry, Jadavion [address] Lynn 04/13/2016, 3:41 PM  [phone]

## 2016-04-14 ENCOUNTER — Encounter (HOSPITAL_COMMUNITY): Payer: Self-pay | Admitting: *Deleted

## 2016-04-14 ENCOUNTER — Inpatient Hospital Stay (HOSPITAL_COMMUNITY): Payer: Medicare Other

## 2016-04-14 ENCOUNTER — Inpatient Hospital Stay (HOSPITAL_COMMUNITY)
Admission: RE | Admit: 2016-04-14 | Discharge: 2016-04-15 | DRG: 057 | Disposition: A | Discharge status: Other Acute Inpatient Hospital | Payer: Medicare Other | Source: Intra-hospital | Attending: Physical Medicine & Rehabilitation | Admitting: Physical Medicine & Rehabilitation

## 2016-04-14 DIAGNOSIS — J449 Chronic obstructive pulmonary disease, unspecified: Secondary | ICD-10-CM | POA: Diagnosis not present

## 2016-04-14 DIAGNOSIS — Z7951 Long term (current) use of inhaled steroids: Secondary | ICD-10-CM

## 2016-04-14 DIAGNOSIS — I6603 Occlusion and stenosis of bilateral middle cerebral arteries: Secondary | ICD-10-CM | POA: Diagnosis present

## 2016-04-14 DIAGNOSIS — I69391 Dysphagia following cerebral infarction: Secondary | ICD-10-CM

## 2016-04-14 DIAGNOSIS — I69319 Unspecified symptoms and signs involving cognitive functions following cerebral infarction: Secondary | ICD-10-CM

## 2016-04-14 DIAGNOSIS — R1312 Dysphagia, oropharyngeal phase: Secondary | ICD-10-CM

## 2016-04-14 DIAGNOSIS — Z9981 Dependence on supplemental oxygen: Secondary | ICD-10-CM | POA: Diagnosis not present

## 2016-04-14 DIAGNOSIS — R1310 Dysphagia, unspecified: Secondary | ICD-10-CM | POA: Diagnosis not present

## 2016-04-14 DIAGNOSIS — Z79899 Other long term (current) drug therapy: Secondary | ICD-10-CM

## 2016-04-14 DIAGNOSIS — E876 Hypokalemia: Secondary | ICD-10-CM | POA: Diagnosis not present

## 2016-04-14 DIAGNOSIS — T82898A Other specified complication of vascular prosthetic devices, implants and grafts, initial encounter: Secondary | ICD-10-CM

## 2016-04-14 DIAGNOSIS — I69351 Hemiplegia and hemiparesis following cerebral infarction affecting right dominant side: Principal | ICD-10-CM

## 2016-04-14 DIAGNOSIS — E785 Hyperlipidemia, unspecified: Secondary | ICD-10-CM

## 2016-04-14 DIAGNOSIS — I10 Essential (primary) hypertension: Secondary | ICD-10-CM

## 2016-04-14 DIAGNOSIS — J410 Simple chronic bronchitis: Secondary | ICD-10-CM

## 2016-04-14 DIAGNOSIS — R0602 Shortness of breath: Secondary | ICD-10-CM

## 2016-04-14 DIAGNOSIS — J690 Pneumonitis due to inhalation of food and vomit: Secondary | ICD-10-CM

## 2016-04-14 LAB — CBC WITH DIFFERENTIAL/PLATELET
BASOS PCT: 0 %
Basophils Absolute: 0 10*3/uL (ref 0.0–0.1)
Eosinophils Absolute: 0 10*3/uL (ref 0.0–0.7)
Eosinophils Relative: 0 %
HEMATOCRIT: 35.9 % — AB (ref 36.0–46.0)
HEMOGLOBIN: 11.7 g/dL — AB (ref 12.0–15.0)
LYMPHS ABS: 0.7 10*3/uL (ref 0.7–4.0)
Lymphocytes Relative: 5 %
MCH: 27.6 pg (ref 26.0–34.0)
MCHC: 32.6 g/dL (ref 30.0–36.0)
MCV: 84.7 fL (ref 78.0–100.0)
MONO ABS: 0.3 10*3/uL (ref 0.1–1.0)
MONOS PCT: 2 %
Neutro Abs: 13.4 10*3/uL — ABNORMAL HIGH (ref 1.7–7.7)
Neutrophils Relative %: 93 %
Platelets: 234 10*3/uL (ref 150–400)
RBC: 4.24 MIL/uL (ref 3.87–5.11)
RDW: 14.2 % (ref 11.5–15.5)
WBC: 14.4 10*3/uL — ABNORMAL HIGH (ref 4.0–10.5)

## 2016-04-14 LAB — COMPREHENSIVE METABOLIC PANEL
ALBUMIN: 2.8 g/dL — AB (ref 3.5–5.0)
ALK PHOS: 68 U/L (ref 38–126)
ALT: 22 U/L (ref 14–54)
ANION GAP: 9 (ref 5–15)
AST: 30 U/L (ref 15–41)
BILIRUBIN TOTAL: 0.7 mg/dL (ref 0.3–1.2)
BUN: 10 mg/dL (ref 6–20)
CALCIUM: 7.3 mg/dL — AB (ref 8.9–10.3)
CO2: 22 mmol/L (ref 22–32)
Chloride: 108 mmol/L (ref 101–111)
Creatinine, Ser: 0.76 mg/dL (ref 0.44–1.00)
GFR calc non Af Amer: >60 mL/min (ref >60)
GLUCOSE: 141 mg/dL — AB (ref 65–99)
POTASSIUM: 3.8 mmol/L (ref 3.5–5.1)
SODIUM: 139 mmol/L (ref 135–145)
TOTAL PROTEIN: 6.6 g/dL (ref 6.5–8.1)

## 2016-04-14 LAB — CBC
HCT: 31.4 % — ABNORMAL LOW (ref 36.0–46.0)
Hemoglobin: 10.4 g/dL — ABNORMAL LOW (ref 12.0–15.0)
MCH: 27.9 pg (ref 26.0–34.0)
MCHC: 33.1 g/dL (ref 30.0–36.0)
MCV: 84.2 fL (ref 78.0–100.0)
Platelets: 183 10*3/uL (ref 150–400)
RBC: 3.73 MIL/uL — ABNORMAL LOW (ref 3.87–5.11)
RDW: 14.1 % (ref 11.5–15.5)
WBC: 10 10*3/uL (ref 4.0–10.5)

## 2016-04-14 LAB — BASIC METABOLIC PANEL
ANION GAP: 6 (ref 5–15)
BUN: 5 mg/dL — ABNORMAL LOW (ref 6–20)
CALCIUM: 6.9 mg/dL — AB (ref 8.9–10.3)
CO2: 23 mmol/L (ref 22–32)
Chloride: 110 mmol/L (ref 101–111)
Creatinine, Ser: 0.5 mg/dL (ref 0.44–1.00)
GFR calc Af Amer: >60 mL/min (ref >60)
GLUCOSE: 98 mg/dL (ref 65–99)
POTASSIUM: 3.2 mmol/L — AB (ref 3.5–5.1)
SODIUM: 139 mmol/L (ref 135–145)

## 2016-04-14 LAB — LACTIC ACID, PLASMA: Lactic Acid, Venous: 2 mmol/L (ref 0.5–1.9)

## 2016-04-14 LAB — CREATININE, SERUM
CREATININE: 0.7 mg/dL (ref 0.44–1.00)
GFR calc Af Amer: >60 mL/min (ref >60)

## 2016-04-14 MED ORDER — ENOXAPARIN SODIUM 40 MG/0.4ML ~~LOC~~ SOLN
40.0000 mg | SUBCUTANEOUS | Status: DC
  Administered 2016-04-14: 40 mg via SUBCUTANEOUS
  Filled 2016-04-14: qty 0.4

## 2016-04-14 MED ORDER — SODIUM CHLORIDE 0.9 % IV SOLN: INTRAVENOUS | Status: DC

## 2016-04-14 MED ORDER — ASPIRIN 325 MG PO TABS: 325.0000 mg | ORAL_TABLET | Freq: Every day | ORAL | Status: DC

## 2016-04-14 MED ORDER — LEVALBUTEROL HCL 0.63 MG/3ML IN NEBU
0.6300 mg | INHALATION_SOLUTION | Freq: Three times a day (TID) | RESPIRATORY_TRACT | Status: DC
  Administered 2016-04-14: 0.63 mg via RESPIRATORY_TRACT
  Filled 2016-04-14: qty 3

## 2016-04-14 MED ORDER — RESOURCE THICKENUP CLEAR PO POWD
ORAL | Status: DC | PRN
  Filled 2016-04-14: qty 125

## 2016-04-14 MED ORDER — LEVALBUTEROL HCL 0.63 MG/3ML IN NEBU
0.6300 mg | INHALATION_SOLUTION | Freq: Four times a day (QID) | RESPIRATORY_TRACT | Status: DC | PRN
  Administered 2016-04-14: 0.63 mg via RESPIRATORY_TRACT
  Filled 2016-04-14: qty 3

## 2016-04-14 MED ORDER — ONDANSETRON HCL 4 MG PO TABS: 4.0000 mg | ORAL_TABLET | Freq: Four times a day (QID) | ORAL | Status: DC | PRN

## 2016-04-14 MED ORDER — FLUTICASONE FUROATE-VILANTEROL 100-25 MCG/INH IN AEPB
1.0000 | INHALATION_SPRAY | Freq: Every day | RESPIRATORY_TRACT | Status: DC
  Filled 2016-04-14: qty 28

## 2016-04-14 MED ORDER — ACETAMINOPHEN 650 MG RE SUPP
650.0000 mg | RECTAL | Status: DC | PRN
  Administered 2016-04-14: 650 mg via RECTAL
  Filled 2016-04-14: qty 1

## 2016-04-14 MED ORDER — TIOTROPIUM BROMIDE MONOHYDRATE 18 MCG IN CAPS
18.0000 ug | ORAL_CAPSULE | Freq: Every day | RESPIRATORY_TRACT | Status: DC
  Filled 2016-04-14: qty 5

## 2016-04-14 MED ORDER — ACETAMINOPHEN 325 MG PO TABS: 650.0000 mg | ORAL_TABLET | ORAL | Status: DC | PRN

## 2016-04-14 MED ORDER — PANTOPRAZOLE SODIUM 40 MG PO TBEC: 40.0000 mg | DELAYED_RELEASE_TABLET | Freq: Every day | ORAL | Status: DC

## 2016-04-14 MED ORDER — ENOXAPARIN SODIUM 40 MG/0.4ML ~~LOC~~ SOLN: 40.0000 mg | SUBCUTANEOUS | Status: DC

## 2016-04-14 MED ORDER — ATORVASTATIN CALCIUM 20 MG PO TABS
20.0000 mg | ORAL_TABLET | Freq: Every day | ORAL | Status: DC
  Administered 2016-04-14: 20 mg via ORAL
  Filled 2016-04-14: qty 1

## 2016-04-14 MED ORDER — TOBRAMYCIN 300 MG/5ML IN NEBU
300.0000 mg | INHALATION_SOLUTION | Freq: Every day | RESPIRATORY_TRACT | Status: DC
  Filled 2016-04-14: qty 5

## 2016-04-14 MED ORDER — SORBITOL 70 % SOLN: 30.0000 mL | Freq: Every day | Status: DC | PRN

## 2016-04-14 MED ORDER — ORAL CARE MOUTH RINSE: 15.0000 mL | Freq: Two times a day (BID) | OROMUCOSAL | Status: DC

## 2016-04-14 MED ORDER — POTASSIUM CHLORIDE 20 MEQ PO PACK
40.0000 meq | PACK | Freq: Once | ORAL | Status: AC
  Administered 2016-04-14: 40 meq via ORAL
  Filled 2016-04-14: qty 2

## 2016-04-14 MED ORDER — ONDANSETRON HCL 4 MG/2ML IJ SOLN: 4.0000 mg | Freq: Four times a day (QID) | INTRAMUSCULAR | Status: DC | PRN

## 2016-04-14 MED FILL — Alteplase For Inj 100 MG: INTRAVENOUS | Qty: 100 | Status: AC

## 2016-04-14 NOTE — Progress Notes (Signed)
STROKE TEAM PROGRESS NOTE   SUBJECTIVE (INTERVAL HISTORY) Daughter at bedside. Patient in chair. Electronic sitter present.   OBJECTIVE Temp:  [97.6 F (36.4 C)-98.6 F (37 C)] 97.8 F (36.6 C) (11/28 0950) Pulse Rate:  [71-88] 88 (11/28 0950) Cardiac Rhythm: Normal sinus rhythm (11/28 0728) Resp:  [20-50] 20 (11/28 0950) BP: (114-151)/(41-65) 115/62 (11/28 0950) SpO2:  [93 %-100 %] 100 % (11/28 0950) Weight:  [77.4 kg (170 lb 10.2 oz)] 77.4 kg (170 lb 10.2 oz) (11/27 1430)  CBC:  Recent Labs Lab 04/09/16 1827  04/10/16 0636  04/12/16 0220 04/13/16 0432  WBC 12.9*  --  16.6*  < > 10.3 11.1*  NEUTROABS 11.7*  --  14.1*  --   --   --   HGB 14.5  < > 12.1  < > 10.8* 9.9*  HCT 44.1  < > 37.6  < > 34.6* 31.2*  MCV 83.8  --  84.1  < > 86.1 84.6  PLT 168  --  142*  < > 163 173  < > = values in this interval not displayed.  Basic Metabolic Panel:   Recent Labs Lab 04/13/16 0432 04/14/16 0530  NA 136 139  K 3.2* 3.2*  CL 108 110  CO2 23 23  GLUCOSE 114* 98  BUN 11 5*  CREATININE 0.67 0.50  CALCIUM 6.9* 6.9*    Lipid Panel:     Component Value Date/Time   CHOL 159 04/10/2016 0637   TRIG 76 04/10/2016 0637   HDL 52 04/10/2016 0637   CHOLHDL 3.1 04/10/2016 0637   VLDL 15 04/10/2016 0637   LDLCALC 92 04/10/2016 0637   HgbA1c:  Lab Results  Component Value Date   HGBA1C 5.8 (H) 04/10/2016   Urine Drug Screen: No results found for: LABOPIA, COCAINSCRNUR, LABBENZ, AMPHETMU, THCU, LABBARB    IMAGING No results found.   TEE No intracardiac source of embolism or shunt. Moderate atherosclerosis in the ascnding aorta and arch, but without frank plaque ulceration or thrombus.   PHYSICAL EXAM General - Well nourished, well developed,  Ophthalmologic - Fundi not visualized   Cardiovascular - Regular rate and rhythm. Neuro - , alert, Oriented to self only. Alert.  Speech is fluent. No aphasia. . Slight disorientation but follows all simple midline and peripheral  commands. Eyes in mid position, able to attend both sides, PERRL, impaired upgaze otherwise EOMI, facial symmetric. Moves all extremities symmetrically 4/5 proximal and distally.DTR 1+ bilaterally and no babinski. Sensation symmetrical although limited testing due impaired cognition. Slow FTN but symmetrical. Gait not tested.    ASSESSMENT/PLAN Catherine Day is a 82 y.o. female with history of COPD and hypertension presenting with sudden onset of right sided weakness and inability to speak.   She received IV t-PA and underwent mechanical thrombectomy with complete revascularization of occluded LT MCA ,Lt ACA and Lt ICA terminus.  Stroke:  Dominant left brain infarct s/p tPA and revascularization of occluded LT MCA ,Lt ACA and Lt ICA terminus, embolic pattern secondary to likely cardioembolic source.  Resultant  Rapid neuro improvement,   MRI - Small volume of scattered acute infarcts in the left hemisphere. petechial hemorrhage in the left mesial temporal lobe. A small number of other scattered small infarcts in the bilateral MCA and left cerebellar artery territories   MRA - Possible occlusion of the left PCA P3 inferior division.  Cerebral angiogram - complete revascularization of occluded LT MCA ,Lt ACA and Lt ICA terminus  2D Echo - Systolic function  was probably mildly reduced. No cardiac source of emboli identified.  TEE no source of embolus  loop recorder implant deferred by cardiology consideration based on stroke outcome and if pt plans on return [facility]  LDL - 92  HgbA1c - 5.8  VTE prophylaxis - SCDs ordered but not on, Add lovenox DIET DYS 3 Room service appropriate? Yes; Fluid consistency: Honey Thick  No antithrombotic prior to admission, now on Aspirin 325 mg daily   Ongoing aggressive stroke risk factor management  Therapy recommendations: Inpatient rehabilitation, admissions coordinator is following    Disposition:   Pending  Hypertension  Stable  Hyperlipidemia  Home meds:  No lipid lowering medications prior to admission  LDL 92, goal < 70  Now on Lipitor 20 mg daily  Continue statin at discharge  Other Stroke Risk Factors  Advanced age  Other Active Problems  Contrast extravasation left arm after IV infiltrated.  Leukocytosis - 16.6  -> 15.1 (afebrile) -> 11.1  Hypokalemia - 3.3 -> 3.2. Given 40 meq KCL x 2 today. Recheck in am  Anemia - 15.6 / 46 -> 9.9 / 31.2 - continue to monitor - check stool guaiacs and iron 21, TIBC 228, Sat 11%. Continue to monitor  [facility]  [facility]  Hospital day # 5  Rhoderick MoodyBIBY,SHARON  [facility] See Amion for Pager information 04/14/2016 11:24 AM  I have personally examined this patient, reviewed notes, independently viewed imaging studies, participated in medical decision making and plan of care.ROS completed by me personally and pertinent positives fully documented  I have made any additions or clarifications directly to the above note. Agree with note above. Patient is medically stable to be transferred to inpatient rehabilitation when bed available. Long discussion with the patient and daughter at the bedside and answered questions. Greater than 50% time during this 25 minute visit was spent on counseling and coordination of care about stroke risk, prevention and treatment  Delia HeadyPramod Sethi, MD Medical Director [facility] Pager: [phone] 04/14/2016 12:31 PM  To contact Stroke Continuity provider, please refer to [email or web address]. After hours, contact General Neurology

## 2016-04-14 NOTE — Progress Notes (Signed)
Dr Johney FrameAllred reviewed chart. With [place] living in [place], would recommend 30 day monitor and follow up with cardiology there for consideration of ILR implant to ensure adequate follow up. Discussed with neurology.  Gypsy BalsamAmber Lisandra Mathisen, NP 04/14/2016 10:17 AM

## 2016-04-14 NOTE — NC FL2 (Signed)
[place] MEDICAID FL2 LEVEL OF CARE SCREENING TOOL     IDENTIFICATION  Patient Name: Catherine ClossRegina Day Birthdate: 10/06/1927 Sex: female Admission Date (Current Location): 04/09/2016  [facility] [place]   Facility and Address:  [facility], [address], [place], [place] [address]      Provider Number: [id number]  Attending Physician Name and Address:  Micki RileyPramod S Sethi, MD  Relative Name and Phone Number:       Current Level of Care: Hospital Recommended Level of Care: Skilled Nursing Facility Prior Approval Number:    Date Approved/Denied:   PASRR Number:    Discharge Plan: SNF    Current Diagnoses: Patient Active Problem List   Diagnosis Date Noted  . Cerebrovascular accident (CVA) due to thrombosis of right middle cerebral artery (HCC)   . Diastolic dysfunction   . Chronic obstructive pulmonary disease (HCC)   . Acute blood loss anemia   . Leukocytosis   . Hypokalemia   . Cerebrovascular accident (CVA) due to bilateral occlusion of middle cerebral arteries (HCC)   . Acute ischemic stroke (HCC)   . Cerebrovascular accident (CVA) due to thrombosis of left middle cerebral artery (HCC) 04/09/2016    Orientation RESPIRATION BLADDER Height & Weight     Self, Time  O2 (3L) Continent Weight: 170 lb 10.2 oz (77.4 kg) Height:  5\' 6"  (167.6 cm)  BEHAVIORAL SYMPTOMS/MOOD NEUROLOGICAL BOWEL NUTRITION STATUS      Continent Diet (DYS 3, Honey Thick Liquids)  AMBULATORY STATUS COMMUNICATION OF NEEDS Skin   Limited Assist Verbally Normal                       Personal Care Assistance Level of Assistance  Bathing, Feeding, Dressing Bathing Assistance: Limited assistance Feeding assistance: Independent Dressing Assistance: Limited assistance     Functional Limitations Info  Sight, Hearing, Speech Sight Info: Adequate Hearing Info: Adequate Speech Info: Adequate    SPECIAL CARE FACTORS FREQUENCY  PT (By licensed PT), OT  (By licensed OT), Speech therapy     PT Frequency: 5/wk OT Frequency: 5/wk     Speech Therapy Frequency: 5/wk      Contractures Contractures Info: Not present    Additional Factors Info  Code Status, Allergies Code Status Info: Full Code Allergies Info: No known allergies           Current Medications (04/14/2016):  This is the current hospital active medication list Current Facility-Administered Medications  Medication Dose Route Frequency Provider Last Rate Last Dose  . 0.9 %  sodium chloride infusion   Intravenous Continuous Rejeana BrockMcNeill P Kirkpatrick, MD 75 mL/hr at 04/13/16 0600    . acetaminophen (TYLENOL) tablet 650 mg  650 mg Oral Q4H PRN Rejeana BrockMcNeill P Kirkpatrick, MD       Or  . acetaminophen (TYLENOL) suppository 650 mg  650 mg Rectal Q4H PRN Rejeana BrockMcNeill P Kirkpatrick, MD      . aspirin tablet 325 mg  325 mg Oral Daily David L Rinehuls, PA-C   325 mg at 04/14/16 1102  . atorvastatin (LIPITOR) tablet 20 mg  20 mg Oral q1800 David L Rinehuls, PA-C   20 mg at 04/13/16 1716  . enoxaparin (LOVENOX) injection 40 mg  40 mg Subcutaneous Q24H Layne BentonSharon L Biby, NP      . fluticasone furoate-vilanterol (BREO ELLIPTA) 100-25 MCG/INH 1 puff  1 puff Inhalation Daily David L Rinehuls, PA-C   1 puff at 04/14/16 0725  . levalbuterol (  XOPENEX) nebulizer solution 0.63 mg  0.63 mg Nebulization Q6H PRN Mosetta AnisMichael T Bitonti, RPH      . levalbuterol Pauline Aus(XOPENEX) nebulizer solution 0.63 mg  0.63 mg Nebulization TID Kalman ShanMurali Ramaswamy, MD   0.63 mg at 04/14/16 0725  . pantoprazole (PROTONIX) injection 40 mg  40 mg Intravenous QHS Rejeana BrockMcNeill P Kirkpatrick, MD   40 mg at 04/13/16 2054  . potassium chloride (KLOR-CON) packet 40 mEq  40 mEq Oral Once Micki RileyPramod S Sethi, MD      . RESOURCE THICKENUP CLEAR   Oral PRN Marvel PlanJindong Xu, MD      . sodium chloride flush (NS) 0.9 % injection 10-40 mL  10-40 mL Intracatheter PRN Marvel PlanJindong Xu, MD   20 mL at 04/13/16 1748  . tiotropium (SPIRIVA) inhalation capsule 18 mcg  18 mcg Inhalation Daily  David L Rinehuls, PA-C   18 mcg at 04/14/16 0725  . tobramycin (PF) (TOBI) nebulizer solution 300 mg  300 mg Nebulization Daily David L Rinehuls, PA-C   300 mg at 04/11/16 1238     Discharge Medications: Please see discharge summary for a list of discharge medications.  Relevant Imaging Results:  Relevant Lab Results:   Additional Information SSN:  [id number]  Dede QuerySarah McNulty, LCSW  I have personally examined this patient, reviewed notes, independently viewed imaging studies, participated in medical decision making and plan of care.ROS completed by me personally and pertinent positives fully documented  I have made any additions or clarifications directly to the above note. Agree with note above.   Delia HeadyPramod Sethi, MD Medical Director [facility] Pager: [phone] 04/14/2016 1:01 PM

## 2016-04-14 NOTE — H&P (Signed)
Physical Medicine and Rehabilitation Admission H&P       Chief Complaint  Patient presents with  . Altered Mental Status  . Code Stroke  : HPI: Catherine Day a 82 y.o.right handed femalewith history of COPD.Per chart review patient lives in [place] with her daughter. Independent prior to admission. She had been visiting a son in [place].Presented 04/10/2016 with sudden onset of right-sided weakness and inability to speak. 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 No associated mass effect. Small number of other scattered small infarcts in the bilateral MCA and left cerebellar artery territories. Patient did receive TPA. MRI showed possible occlusion of the left PCA P3 inferior division.. Echocardiogram with systolic function mildly reduced. Grade 1 diastolic dysfunction. Underwent bilateral common carotid arteriograms followed by complete revascularization of occluded left MCA, left ACA and left ICA terminus. Neurology follow-up currently maintained on aspirin for CVA prophylaxis.TEE completed 04/13/2016 showing no intracardiac source of embolism or shunt. MBS completed 04/12/2016 advised mechanical soft honey thick liquid diet.Subcutaneous Lovenox added for DVT prophylaxis 04/14/2016. Physical and occupational therapy evaluations completed with recommendations of physical medicine rehabilitation consult.Patient was admitted for a comprehensive rehabilitation program.  Review of Systems  Constitutional: Negative for chills and fever.  HENT: Negative for hearing loss and tinnitus.   Eyes: Negative for blurred vision and double vision.  Respiratory: Positive for shortness of breath.        Intermittent cough  Cardiovascular: Positive for leg swelling. Negative for chest pain and palpitations.  Gastrointestinal: Positive for constipation.  Negative for nausea and vomiting.  Genitourinary: Negative for dysuria, flank pain and hematuria.  Musculoskeletal: Positive for joint pain and myalgias.  Skin: Negative for rash.  Neurological: Positive for speech change and weakness. Negative for seizures and loss of consciousness.  Psychiatric/Behavioral: Positive for memory loss.  All other systems reviewed and are negative.      Past Medical History:  Diagnosis Date  . COPD (chronic obstructive pulmonary disease) ([place])         Past Surgical History:  Procedure Laterality Date  . IR GENERIC HISTORICAL  04/09/2016   IR ANGIO INTRA EXTRACRAN SEL COM CAROTID INNOMINATE UNI R MOD SED 04/09/2016 Luanne Bras, MD [facility]  . IR GENERIC HISTORICAL  04/09/2016   IR PERCUTANEOUS ART THROMBECTOMY/INFUSION INTRACRANIAL INC DIAG ANGIO 04/09/2016 Luanne Bras, MD [facility]  . PELVIC FRACTURE SURGERY    . RADIOLOGY WITH ANESTHESIA N/A 04/09/2016   Procedure: RADIOLOGY WITH ANESTHESIA;  Surgeon: Luanne Bras, MD;  Location: [facility];  Service: Radiology;  Laterality: N/A;  . TEE WITHOUT CARDIOVERSION N/A 04/13/2016   Procedure: TRANSESOPHAGEAL ECHOCARDIOGRAM (TEE);  Surgeon: Sanda Klein, MD;  Location: [facility] ENDOSCOPY;  Service: Cardiovascular;  Laterality: N/A;   History reviewed. No pertinent family history. Social History:  reports that she has never smoked. She has never used smokeless tobacco. She reports that she does not drink alcohol or use drugs. Allergies: No Known Allergies       Medications Prior to Admission  Medication Sig Dispense Refill  . Calcium Citrate-Vitamin D (CITRACAL + D PO) Take 1 tablet by mouth daily.    Marland Kitchen denosumab (PROLIA) 60 MG/ML SOLN injection Inject 60 mg into the skin every 6 (six) months. Administer in upper arm, thigh, or abdomen Last injection 03/24/16    . fluticasone furoate-vilanterol (BREO ELLIPTA) 100-25 MCG/INH AEPB Inhale 1 puff  into the lungs daily.    Marland Kitchen  ipratropium-albuterol (DUONEB) 0.5-2.5 (3) MG/3ML SOLN Take 3 mLs by nebulization See admin instructions. Inhale 1 vial by nebulization every morning and may also use 2 more times during the day as needed for shortness of breath or wheezing    . levalbuterol (XOPENEX HFA) 45 MCG/ACT inhaler Inhale 1-2 puffs into the lungs every 4 (four) hours as needed for wheezing or shortness of breath.    . methylPREDNISolone (MEDROL DOSEPAK) 4 MG TBPK tablet Take 4-8 mg by mouth See admin instructions. Tapered course started 04/09/16 am -Day 1: take 2 tablets (8 mg) by mouth with breakfast, 1 tablet (4 mg) with lunch and supper and 2 tablets at bedtime; Day 2: take 1 tablet with breakfast, lunch and supper and 2 tablets at bedtime; Day 3: take 1 tablet with breakfast, lunch, supper and at bedtime; Day 4: take 1 tablet with breakfast, lunch and at bedtime; Day 5: take 1 tablet with breakfast and at bedtime; Day 6: take 1 tablet with breakfast  0  . OXYGEN Inhale 2 L into the lungs continuous.    Marland Kitchen tiotropium (SPIRIVA) 18 MCG inhalation capsule Place 18 mcg into inhaler and inhale daily.    Marland Kitchen tobramycin, PF, (TOBI) 300 MG/5ML nebulizer solution Take 300 mg by nebulization daily.    . valsartan-hydrochlorothiazide (DIOVAN-HCT) 80-12.5 MG tablet Take 1 tablet by mouth daily.      Home: Home Living Family/patient expects to be discharged to:: Private residence Living Arrangements: Children (lives with her daughter in [place]) Available Help at Discharge: Family, Available PRN/intermittently Home Equipment: Gilford Rile - 4 wheels (oxygen) Additional Comments: pt reports she lives in [place]; visiting her son  Lives With: Daughter (in [place])   Functional History: Prior Function Level of Independence: Independent Comments: per pt  Functional Status:  Mobility: Bed Mobility Overal bed mobility: Needs Assistance Bed Mobility: Supine to Sit Supine to sit: Supervision, HOB elevated General bed mobility  comments: Pt sitting up in recliner with family present when PT arrived.  Transfers Overall transfer level: Needs assistance Equipment used: Rolling walker (2 wheeled) Transfers: Sit to/from Stand Sit to Stand: Min assist Stand pivot transfers: Min guard General transfer comment: VC's for hand placement on seated surface for safety. Pt required assist for controlled descent to chair.  Ambulation/Gait Ambulation/Gait assistance: Min assist Ambulation Distance (Feet): 50 Feet Assistive device: Rolling walker (2 wheeled) Gait Pattern/deviations: Step-through pattern, Decreased stride length, Trendelenburg, Trunk flexed General Gait Details: VC's for improved posture and general safety with the RW. Pt required occasional min assist for balance support and walker maneuvering.  Gait velocity: Decreased Gait velocity interpretation: Below normal speed for age/gender    ADL: ADL Overall ADL's : Needs assistance/impaired Eating/Feeding: Independent, Sitting Grooming: Brushing hair, Sitting, Set up Upper Body Bathing: Minimal assitance, Sitting Lower Body Bathing: Minimal assistance, Sit to/from stand Upper Body Dressing : Minimal assistance, Sitting Upper Body Dressing Details (indicate cue type and reason): front opening gown Lower Body Dressing: Minimal assistance, Sit to/from stand Lower Body Dressing Details (indicate cue type and reason): pt able to cross foot over opposite knee to don and doff socks Toilet Transfer: Min guard, Ambulation, RW Toilet Transfer Details (indicate cue type and reason): simulated to chair Toileting- Clothing Manipulation and Hygiene: Supervision/safety, Sitting/lateral lean Functional mobility during ADLs: Minimal assistance, Rolling walker General ADL Comments: Per daughter, pt required a lot of encouragement to shower, but once in the shower, performed by herself.   Cognition: Cognition  Overall Cognitive Status: Impaired/Different from  baseline Arousal/Alertness: Awake/alert Orientation Level: Oriented to person, Disoriented to situation, Disoriented to place, Oriented to time Attention: Sustained Sustained Attention: Appears intact Memory: Impaired Memory Impairment: Storage deficit, Retrieval deficit Awareness: Impaired Awareness Impairment: Emergent impairment, Intellectual impairment, Anticipatory impairment Problem Solving: Impaired Problem Solving Impairment: Verbal basic Safety/Judgment: Impaired Cognition Arousal/Alertness: Awake/alert Behavior During Therapy: WFL for tasks assessed/performed Overall Cognitive Status: Impaired/Different from baseline Area of Impairment: Safety/judgement, Memory Orientation Level: Time, Situation Current Attention Level: Sustained Memory: Decreased short-term memory Safety/Judgement: Decreased awareness of safety, Decreased awareness of deficits Awareness:  (pre-intellectual) Problem Solving: Slow processing, Requires verbal cues General Comments: family in room and reports she is not near her baseline.  Physical Exam: Blood pressure 115/62, pulse 88, temperature 97.8 F (36.6 C), temperature source Oral, resp. rate 20, height '5\' 6"'  (1.676 m), weight 77.4 kg (170 lb 10.2 oz), SpO2 100 %. Physical Exam  Vitals reviewed. Constitutional: She is oriented to person, place, and time.  HENT:  Head: Normocephalic.  Eyes: Conjunctivae and EOM are normal. Scleral icterus is present.  Neck: Normal range of motion. Neck supple. No JVD present. No tracheal deviation present. No thyromegaly present.  Cardiovascular: Normal rate, regular rhythm and normal heart sounds.  Exam reveals no friction rub.   No murmur heard. Respiratory: Effort normal. No respiratory distress. She has no wheezes. She has no rales.  Scattered rhonchi and upper airway sounds.   GI: Soft. Bowel sounds are normal. She exhibits no distension. There is no tenderness. There is no guarding.  Musculoskeletal: She  exhibits no edema or deformity.  Neurological: She is alert and oriented to person, place, and time.  Followed simple commands. Speech slightly dysarthric but intelligible. Fair insight and awareness. Language appears functional. RUE 4/5 deltoid, bicep, tricep, HI, RLE: 3+HF, 4/5 KE and 4/5ADF/PF. LUE and LLE grossly 5/5. Senses pain and LT in both right and left limbs.      Skin: Skin is warmand dry.  Psychiatric: She has a normal mood and affect. Her behavior is normal  Lab Results Last 48 Hours  Results for orders placed or performed during the hospital encounter of 04/09/16 (from the past 48 hour(s))  Iron and TIBC     Status: Abnormal   Collection Time: 04/12/16  3:00 PM  Result Value Ref Range   Iron 24 (L) 28 - 170 ug/dL   TIBC 228 (L) 250 - 450 ug/dL   Saturation Ratios 11 10.4 - 31.8 %   UIBC 204 ug/dL  CBC     Status: Abnormal   Collection Time: 04/13/16  4:32 AM  Result Value Ref Range   WBC 11.1 (H) 4.0 - 10.5 K/uL   RBC 3.69 (L) 3.87 - 5.11 MIL/uL   Hemoglobin 9.9 (L) 12.0 - 15.0 g/dL   HCT 31.2 (L) 36.0 - 46.0 %   MCV 84.6 78.0 - 100.0 fL   MCH 26.8 26.0 - 34.0 pg   MCHC 31.7 30.0 - 36.0 g/dL   RDW 14.1 11.5 - 15.5 %   Platelets 173 150 - 400 K/uL  Basic metabolic panel     Status: Abnormal   Collection Time: 04/13/16  4:32 AM  Result Value Ref Range   Sodium 136 135 - 145 mmol/L   Potassium 3.2 (L) 3.5 - 5.1 mmol/L   Chloride 108 101 - 111 mmol/L   CO2 23 22 - 32 mmol/L   Glucose, Bld 114 (H) 65 - 99 mg/dL   BUN 11 6 -  20 mg/dL   Creatinine, Ser 0.67 0.44 - 1.00 mg/dL   Calcium 6.9 (L) 8.9 - 10.3 mg/dL   GFR calc non Af Amer >60 >60 mL/min   GFR calc Af Amer >60 >60 mL/min    Comment: (NOTE) The eGFR has been calculated using the CKD EPI equation. This calculation has not been validated in all clinical situations. eGFR's persistently <60 mL/min signify possible Chronic Kidney Disease.   Anion gap 5 5 - 15  Basic metabolic  panel     Status: Abnormal   Collection Time: 04/14/16  5:30 AM  Result Value Ref Range   Sodium 139 135 - 145 mmol/L   Potassium 3.2 (L) 3.5 - 5.1 mmol/L   Chloride 110 101 - 111 mmol/L   CO2 23 22 - 32 mmol/L   Glucose, Bld 98 65 - 99 mg/dL   BUN 5 (L) 6 - 20 mg/dL   Creatinine, Ser 0.50 0.44 - 1.00 mg/dL   Calcium 6.9 (L) 8.9 - 10.3 mg/dL   GFR calc non Af Amer >60 >60 mL/min   GFR calc Af Amer >60 >60 mL/min    Comment: (NOTE) The eGFR has been calculated using the CKD EPI equation. This calculation has not been validated in all clinical situations. eGFR's persistently <60 mL/min signify possible Chronic Kidney Disease.   Anion gap 6 5 - 15      Imaging Results (Last 48 hours)  Dg Swallowing Func-speech Pathology  Result Date: 04/12/2016 Objective Swallowing Evaluation: Type of Study: MBS-Modified Barium Swallow Study Patient Details Name: Catherine Day MRN: [id number] Date of Birth: 10-Mar-1928 Today's Date: 04/12/2016 Time: SLP Start Time (ACUTE ONLY): 1116-SLP Stop Time (ACUTE ONLY): 1131 SLP Time Calculation (min) (ACUTE ONLY): 15 min Past Medical History: Past Medical History: Diagnosis Date . COPD (chronic obstructive pulmonary disease) ([place])  Past Surgical History: Past Surgical History: Procedure Laterality Date . PELVIC FRACTURE SURGERY   . RADIOLOGY WITH ANESTHESIA N/A 04/09/2016  Procedure: RADIOLOGY WITH ANESTHESIA;  Surgeon: Luanne Bras, MD;  Location: [facility];  Service: Radiology;  Laterality: N/A; HPI: Ms Shugart is an 82 yo female with h/o COPD admitted with mental status change, right sided weakness, hemianopsia, left gaze preference,  Pt required intubation and was extubated yesterday.  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  Swallow and speech eval ordered Subjective: pt awake, pleasant Assessment / Plan / Recommendation CHL IP CLINICAL IMPRESSIONS 04/12/2016  Therapy Diagnosis Moderate pharyngeal phase dysphagia Clinical Impression Pt has a moderate pharyngeal dysphagia due in large part to delayed swallow initiation and decreased sensation of airway penetration. Liquids spill to the pyriform sinuses, resulting in deep, silent penetration of thin and nectar thick liquids. Although this does not occur with every bolus, it is silent and unable to be completely cleared with cued coughing, making her at a higher risk for an aspiration-related infection. Her strength is adequate with no significant residuals remaining post-swallow. Recommend Dys 3 diet and honey thick liquids. SLP to f/u for tolerance and readiness to advance. Impact on safety and function Moderate aspiration risk   CHL IP TREATMENT RECOMMENDATION 04/12/2016 Treatment Recommendations Therapy as outlined in treatment plan below   Prognosis 04/12/2016 Prognosis for Safe Diet Advancement Good Barriers to Reach Goals -- Barriers/Prognosis Comment -- CHL IP DIET RECOMMENDATION 04/12/2016 SLP Diet Recommendations Dysphagia 3 (Mech soft) solids;Honey thick liquids Liquid Administration via Cup;No straw Medication Administration Whole meds with puree Compensations Slow  rate;Small sips/bites Postural Changes Remain semi-upright after after feeds/meals (Comment);Seated upright at 90 degrees   CHL IP OTHER RECOMMENDATIONS 04/12/2016 Recommended Consults -- Oral Care Recommendations Oral care BID Other Recommendations Order thickener from pharmacy;Prohibited food (jello, ice cream, thin soups);Remove water pitcher   CHL IP FOLLOW UP RECOMMENDATIONS 04/12/2016 Follow up Recommendations Inpatient Rehab   CHL IP FREQUENCY AND DURATION 04/12/2016 Speech Therapy Frequency (ACUTE ONLY) min 2x/week Treatment Duration 2 weeks      CHL IP ORAL PHASE 04/12/2016 Oral Phase WFL Oral - Pudding Teaspoon -- Oral - Pudding Cup -- Oral - Honey Teaspoon -- Oral - Honey Cup -- Oral - Nectar Teaspoon -- Oral - Nectar Cup -- Oral - Nectar  Straw -- Oral - Thin Teaspoon -- Oral - Thin Cup -- Oral - Thin Straw -- Oral - Puree -- Oral - Mech Soft -- Oral - Regular -- Oral - Multi-Consistency -- Oral - Pill -- Oral Phase - Comment --  CHL IP PHARYNGEAL PHASE 04/12/2016 Pharyngeal Phase Impaired Pharyngeal- Pudding Teaspoon -- Pharyngeal -- Pharyngeal- Pudding Cup -- Pharyngeal -- Pharyngeal- Honey Teaspoon -- Pharyngeal -- Pharyngeal- Honey Cup Delayed swallow initiation-pyriform sinuses Pharyngeal -- Pharyngeal- Nectar Teaspoon -- Pharyngeal -- Pharyngeal- Nectar Cup Delayed swallow initiation-pyriform sinuses;Penetration/Aspiration before swallow Pharyngeal Material enters airway, remains ABOVE vocal cords and not ejected out Pharyngeal- Nectar Straw -- Pharyngeal -- Pharyngeal- Thin Teaspoon -- Pharyngeal -- Pharyngeal- Thin Cup Delayed swallow initiation-pyriform sinuses;Penetration/Aspiration before swallow Pharyngeal Material enters airway, CONTACTS cords and not ejected out Pharyngeal- Thin Straw -- Pharyngeal -- Pharyngeal- Puree Delayed swallow initiation-vallecula Pharyngeal -- Pharyngeal- Mechanical Soft Delayed swallow initiation-vallecula Pharyngeal -- Pharyngeal- Regular -- Pharyngeal -- Pharyngeal- Multi-consistency -- Pharyngeal -- Pharyngeal- Pill -- Pharyngeal -- Pharyngeal Comment --  CHL IP CERVICAL ESOPHAGEAL PHASE 04/12/2016 Cervical Esophageal Phase WFL Pudding Teaspoon -- Pudding Cup -- Honey Teaspoon -- Honey Cup -- Nectar Teaspoon -- Nectar Cup -- Nectar Straw -- Thin Teaspoon -- Thin Cup -- Thin Straw -- Puree -- Mechanical Soft -- Regular -- Multi-consistency -- Pill -- Cervical Esophageal Comment -- No flowsheet data found. Germain Osgood 04/12/2016, 1:05 PM  Germain Osgood, M.A. CCC-SLP [phone]                  Medical Problem List and Plan: 1.  Right hemiparesthesias/dysphagia with cognitive deficits secondary to acute infarct left hemisphere in the left temporal occipital lobes, bilateral MCA and left  cerebellar artery territories             -admit to inpatient rehab 2.  DVT Prophylaxis/Anticoagulation: Subcutaneous Lovenox for DVT prophylaxis 3. Pain Management: Tylenol as needed 4. Dysphagia. Dysphagia #3 Honey thick liquids. Follow-up speech therapy             -aspiration precautions 5. Neuropsych: This patient is capable of making decisions on her own behalf. 6. Skin/Wound Care: Routine skin check 7. Fluids/Electrolytes/Nutrition: Routine I&O with follow-up chemistries upon admit 8. COPD. Continue nebulizer treatments--TID             -utilize flutter valve and IS. 9. Hyperlipidemia. Lipitor 10. Hypokalemia. Follow-up chemistries upon admit   Post Admission Physician Evaluation: 1. Functional deficits secondary  to embolic bi-cerebral infarcts. 2. Patient is admitted to receive collaborative, interdisciplinary care between the physiatrist, rehab nursing staff, and therapy team. 3. Patient's level of medical complexity and substantial therapy needs in context of that medical necessity cannot be provided at a lesser intensity of care such as a SNF. 4. Patient has experienced substantial  functional loss from his/her baseline which was documented above under the "Functional History" and "Functional Status" headings.  Judging by the patient's diagnosis, physical exam, and functional history, the patient has potential for functional progress which will result in measurable gains while on inpatient rehab.  These gains will be of substantial and practical use upon discharge  in facilitating mobility and self-care at the household level. 5. Physiatrist will provide 24 hour management of medical needs as well as oversight of the therapy plan/treatment and provide guidance as appropriate regarding the interaction of the two. 6. The Preadmission Screening has been reviewed and patient status is unchanged unless otherwise stated above. 7. 24 hour rehab nursing will assist with bladder management,  bowel management, safety, skin/wound care, disease management, medication administration, pain management and patient education  and help integrate therapy concepts, techniques,education, etc. 8. PT will assess and treat for/with: Lower extremity strength, range of motion, stamina, balance, functional mobility, safety, adaptive techniques and equipment, NMR, community reintegration, visual-spatial awareness, cognitive behavioral mgt.   Goals are: mod I to supervision. 9. OT will assess and treat for/with: ADL's, functional mobility, safety, upper extremity strength, adaptive techniques and equipment, NMR, cognitive-perceptual awareness, family education.   Goals are: mod I to supervision. Therapy may proceed with showering this patient. 10. SLP will assess and treat for/with: cognition, communication, swallowing.  Goals are: mod I to supervision. 11. Case Management and Social Worker will assess and treat for psychological issues and discharge planning. 12. Team conference will be held weekly to assess progress toward goals and to determine barriers to discharge. 13. Patient will receive at least 3 hours of therapy per day at least 5 days per week. 14. ELOS: 7-10 days       15. Prognosis:  excellent     Meredith Staggers, MD, [facility] 04/14/2016

## 2016-04-14 NOTE — Progress Notes (Addendum)
Upon arrival RN was in the room, neb treatment being given. Patient had increased WOB and Ex Wheezing throughout. RN called Rapid Response and then MD. After treatment patient had slight exp wheeze and WOB improved. Patient stated that she feels better. Patient placed on 3 LPM [place] were SPO2 is 95%. MD ordered CXR. RT will continue to monitor.

## 2016-04-14 NOTE — Progress Notes (Signed)
Rehab admissions - I met with patient and her daughter this am.  Daughter tells me that patient is weaker and decreased endurance since stroke.  Dtr plans to return to [facility] but would like patient to get her rehab here at [facility].  PT/OT called me yesterday telling me that patient was not at baseline per patient's report and that patient needed an inpatient rehab stay.  I have reached out to rehab MD and will await his response.  Call me for questions.  #[phone]

## 2016-04-14 NOTE — Progress Notes (Signed)
Patient got up unassisted, removed nasal cannula, removed IV tubing connected to central line. Bandage pulled half off.  IV team consult in progress. Patient in no distress.

## 2016-04-14 NOTE — Progress Notes (Signed)
Trish Mage, RN Rehab Admission Coordinator Signed Physical Medicine and Rehabilitation  PMR Pre-admission Date of Service: 04/14/2016 12:21 PM  Related encounter: ED to Hosp-Admission (Discharged) from 04/09/2016 in [facility] 66M NEURO MEDICAL       [] Hide copied text PMR Admission Coordinator Pre-Admission Assessment  Patient: Catherine Day is an 82 y.o., female MRN: [id number] DOB: [birth year] Height: 5\' 6"  (167.6 cm) Weight: 77.4 kg (170 lb 10.2 oz)                                                                                                                                                  Insurance Information HMO: No   PPO:       PCP:       IPA:       80/20:       OTHER:   PRIMARY:  Medicare A/B      Policy#: [id number]      Subscriber: Hughie Closs CM Name:        Phone#:       Fax#:   Pre-Cert#:        Employer: Retired Benefits:  Phone #:       Name:   Eff. Date: 07/17/94     Deduct: $1318      Out of Pocket Max: none      Life Max: unlimited CIR: 100%      SNF: 100 days Outpatient: 80%     Co-Pay: 20% Home Health: 100%      Co-Pay: none DME: 80%     Co-Pay: 20% Providers: patient's choice  SECONDARY: Tricare      Policy#:        Subscriber:   CM Name:        Phone#:       Fax#:   Pre-Cert#:        Employer:   Benefits:  Phone #:       Name:   Eff. Date:       Deduct:        Out of Pocket Max:        Life Max:   CIR:        SNF:   Outpatient:       Co-Pay:   Home Health:        Co-Pay:   DME:       Co-Pay:    Emergency Contact Information        Contact Information    Name Relation Home Work Mobile   Nunes,Linda Daughter [phone]     Antunes,Patrick Son [phone]       Current Medical History  Patient Admitting Diagnosis: Scattered acute infarct left hemisphere primarily in the left temporal occipital lobes, bilateral MCA and left cerebellar artery territories   History of Present Illness: An 82 y.o.right handed femalewith  history  of COPD.Per chart review patient lives in [place] with her daughter. Independent prior to admission. She had been visiting a son in [place].Presented 04/10/2016 with sudden onset of right-sided weakness and inability to speak. 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 No associated mass effect. Small number of other scattered small infarcts in the bilateral MCA and left cerebellar artery territories. Patient did receive TPA. MRI showed possible occlusion of the left PCA P3 inferior division.. Echocardiogram with systolic function mildly reduced. Grade 1 diastolic dysfunction. Underwent bilateral common carotid arteriograms followed by complete revascularization of occluded left MCA, left ACA and left ICA terminus. Neurology follow-up currently maintained on aspirin for CVA prophylaxis.TEE completed 04/13/2016 showing no intracardiac source of embolism or shunt.MBS completed 04/12/2016 advised mechanical soft honey thick liquid diet. Physical and occupational therapy evaluations completed with recommendations of physical medicine rehabilitation consult.  Patient to be admitted for a comprehensive inpatient rehabilitation program.    Total: 1=NIH  Past Medical History      Past Medical History:  Diagnosis Date  . COPD (chronic obstructive pulmonary disease) (HCC)     Family History  family history is not on file.  Prior Rehab/Hospitalizations: In 2015 had HHPT in [place].  Has the patient had major surgery during 100 days prior to admission? No  Current Medications   Current Facility-Administered Medications:  .  0.9 %  sodium chloride infusion, , Intravenous, Continuous, Rejeana Brock, MD, Last Rate: 75 mL/hr at 04/13/16 0600 .  acetaminophen (TYLENOL) tablet 650 mg, 650 mg, Oral, Q4H PRN **OR** acetaminophen (TYLENOL)  suppository 650 mg, 650 mg, Rectal, Q4H PRN, Rejeana Brock, MD .  aspirin tablet 325 mg, 325 mg, Oral, Daily, David L Rinehuls, PA-C, 325 mg at 04/14/16 1102 .  atorvastatin (LIPITOR) tablet 20 mg, 20 mg, Oral, q1800, David L Rinehuls, PA-C, 20 mg at 04/13/16 1716 .  enoxaparin (LOVENOX) injection 40 mg, 40 mg, Subcutaneous, Q24H, Layne Benton, NP .  fluticasone furoate-vilanterol (BREO ELLIPTA) 100-25 MCG/INH 1 puff, 1 puff, Inhalation, Daily, David L Rinehuls, PA-C, 1 puff at 04/14/16 0725 .  levalbuterol (XOPENEX) nebulizer solution 0.63 mg, 0.63 mg, Nebulization, Q6H PRN, Mosetta Anis, RPH .  levalbuterol (XOPENEX) nebulizer solution 0.63 mg, 0.63 mg, Nebulization, TID, Kalman Shan, MD, 0.63 mg at 04/14/16 0725 .  pantoprazole (PROTONIX) injection 40 mg, 40 mg, Intravenous, QHS, Rejeana Brock, MD, 40 mg at 04/13/16 2054 .  potassium chloride (KLOR-CON) packet 40 mEq, 40 mEq, Oral, Once, Micki Riley, MD .  RESOURCE THICKENUP CLEAR, , Oral, PRN, Marvel Plan, MD .  sodium chloride flush (NS) 0.9 % injection 10-40 mL, 10-40 mL, Intracatheter, PRN, Marvel Plan, MD, 20 mL at 04/13/16 1748 .  tiotropium [facility]) inhalation capsule 18 mcg, 18 mcg, Inhalation, Daily, Kinnie Scales Rinehuls, PA-C, 18 mcg at 04/14/16 0725 .  tobramycin (PF) (TOBI) nebulizer solution 300 mg, 300 mg, Nebulization, Daily, David L Rinehuls, PA-C, 300 mg at 04/11/16 1238  Patients Current Diet: DIET DYS 3 Room service appropriate? Yes; Fluid consistency: Honey Thick  Precautions / Restrictions Precautions Precautions: Fall Precaution Comments: on 2L 02 at baseline Restrictions Weight Bearing Restrictions: No   Has the patient had 2 or more falls or a fall with injury in the past year?YesDaughter reports 3 falls with a hairline pelvic fracture with a previous fall.  Prior Activity Level Limited Community (  1-2x/wk): Goes out about 1 X a week, mostly home bound.  Home Assistive Devices /  Equipment Home Assistive Devices/Equipment: Environmental consultantWalker (specify type), Wheelchair Home Equipment: Walker - 4 wheels (oxygen)  Prior Device Use: Indicate devices/aids used by the patient prior to current illness, exacerbation or injury? None  Prior Functional Level Prior Function Level of Independence: Independent Comments: per pt  Self Care: Did the patient need help bathing, dressing, using the toilet or eating?  Needed some help.  Daughter provides supervision.  Indoor Mobility: Did the patient need assistance with walking from room to room (with or without device)? Independent  Stairs: Did the patient need assistance with internal or external stairs (with or without device)? Dependent.  Patient unable to tolerate stair climbing.  Functional Cognition: Did the patient need help planning regular tasks such as shopping or remembering to take medications? Needed some help.  Daughter helps with medications and with shopping lists/purchases.  Current Functional Level Cognition Arousal/Alertness: Awake/alert Overall Cognitive Status: Impaired/Different from baseline Current Attention Level: Sustained Orientation Level: Oriented to person, Disoriented to situation, Disoriented to place, Oriented to time Safety/Judgement: Decreased awareness of safety, Decreased awareness of deficits General Comments: family in room and reports she is not near her baseline. Attention: Sustained Sustained Attention: Appears intact Memory: Impaired Memory Impairment: Storage deficit, Retrieval deficit Awareness: Impaired Awareness Impairment: Emergent impairment, Intellectual impairment, Anticipatory impairment Problem Solving: Impaired Problem Solving Impairment: Verbal basic Safety/Judgment: Impaired    Extremity Assessment (includes Sensation/Coordination) Upper Extremity Assessment: Generalized weakness  Lower Extremity Assessment: Defer to PT evaluation RLE Deficits / Details: AROM WFL;  grossly 4/5 LLE Deficits / Details: AROM WFL; ankle DF 5/5   ADLs Overall ADL's : Needs assistance/impaired Eating/Feeding: Independent, Sitting Grooming: Brushing hair, Sitting, Set up Upper Body Bathing: Minimal assitance, Sitting Lower Body Bathing: Minimal assistance, Sit to/from stand Upper Body Dressing : Minimal assistance, Sitting Upper Body Dressing Details (indicate cue type and reason): front opening gown Lower Body Dressing: Minimal assistance, Sit to/from stand Lower Body Dressing Details (indicate cue type and reason): pt able to cross foot over opposite knee to don and doff socks Toilet Transfer: Min guard, Ambulation, RW Toilet Transfer Details (indicate cue type and reason): simulated to chair Toileting- Clothing Manipulation and Hygiene: Supervision/safety, Sitting/lateral lean Functional mobility during ADLs: Minimal assistance, Rolling walker General ADL Comments: Per daughter, pt required a lot of encouragement to shower, but once in the shower, performed by herself.    Mobility Overal bed mobility: Needs Assistance Bed Mobility: Supine to Sit Supine to sit: Supervision, HOB elevated General bed mobility comments: Pt sitting up in recliner with family present when PT arrived.    Transfers Overall transfer level: Needs assistance Equipment used: Rolling walker (2 wheeled) Transfers: Sit to/from Stand Sit to Stand: Min assist Stand pivot transfers: Min guard General transfer comment: VC's for hand placement on seated surface for safety. Pt required assist for controlled descent to chair.    Ambulation / Gait / Stairs / Wheelchair Mobility Ambulation/Gait Ambulation/Gait assistance: ArchitectMin assist Ambulation Distance (Feet): 60 Feet Assistive device: Rolling walker (2 wheeled) Gait Pattern/deviations: Step-through pattern, Decreased stride length, Trunk flexed, Trendelenburg General Gait Details: VC's for improved posture and general safety with the RW. 1 seated rest  break required due to fatigue and DOE. Gait velocity: Decreased Gait velocity interpretation: Below normal speed for age/gender   Posture / Balance Balance Overall balance assessment: Needs assistance Sitting-balance support: Feet supported, No upper extremity supported Sitting balance-Leahy Scale: Fair Standing balance  support: No upper extremity supported, During functional activity Standing balance-Leahy Scale: Poor Standing balance comment: Requires UE support for safety and balance.    Special needs/care consideration BiPAP/CPAP Yes, but she does not wear it at home, so not using in hospital CPM No Continuous Drip IV No Dialysis No         Life Vest No Oxygen Yes, 02 2L at home all the time due to COPD Special Bed No Trach Size No Wound Vac (area) No       Skin Fragile skin                           Bowel mgmt: Last BM 04/14/16 per daughter and patient [facility]adder mgmt: [facility] Diabetic mgmt No   Previous Home Environment Living Arrangements: Children  Lives With: Daughter (in [place]) Available Help at Discharge: Family, Available PRN/intermittently Home Care Services: No Additional Comments: pt reports she lives in [place]; visiting her son  Discharge Living Setting Plans for Discharge Living Setting: Patient's home, House, Lives with (comment) (Lives with daughter in [place].) Type of Home at Discharge: House Discharge Home Layout: One level Discharge Home Access: Stairs to enter Entrance Stairs-Number of Steps: 2 Does the patient have any problems obtaining your medications?: No  Social/Family/Support Systems Patient Roles: Parent (Has a daughter in [place] and a son in [place] Contact Information: Franne FortsLinda Win - daughter Anticipated Caregiver: daughter and hired help Anticipated Industrial/product designerCaregiver's Contact Information: Bonita QuinLinda - daughter - [phone] Ability/Limitations of Caregiver: Dtr works 8 am to 6 pm and has stop in help 2 X a week for 5  hours at a time. Caregiver Availability: Intermittent Discharge Plan Discussed with Primary Caregiver: Yes Is Caregiver In Agreement with Plan?: Yes Does Caregiver/Family have Issues with Lodging/Transportation while Pt is in Rehab?: No  Goals/Additional Needs Patient/Family Goal for Rehab: PT/OT/SLP mod I and supervision goals Expected length of stay: 10-14 days Cultural Considerations: None Dietary Needs: Dys 3, honey thick liquids Equipment Needs: TBD Pt/Family Agrees to Admission and willing to participate: Yes Program Orientation Provided & Reviewed with Pt/Caregiver Including Roles  & Responsibilities: Yes  Decrease burden of Care through IP rehab admission: N/A  Possible need for SNF placement upon discharge: Yes, if patient cannot progress to where daughter can take her home [place].  Patient Condition: This patient's medical and functional status has changed since the consult dated 04/13/16 in which the Rehabilitation Physician documented that the patient was not appropriate for intensive rehabilitative care in an inpatient rehabilitation facility. Due to change in status and issues being addressed, patient's case has been discussed with Dr. Riley KillSwartz and patient now appropriate for inpatient rehabilitation. Will admit to inpatient rehab today.  Preadmission Screen Completed By:  Trish MageLogue, Bettylou Frew M, 04/14/2016 12:43 PM ______________________________________________________________________   Discussed status with Dr. Riley KillSwartz on 04/14/16 at 1241 and received telephone approval for admission today.  Admission Coordinator:  Trish MageLogue, Kery Batzel M, time1241/Date11/28/17       Cosigned by:

## 2016-04-14 NOTE — PMR Pre-admission (Signed)
PMR Admission Coordinator Pre-Admission Assessment  Patient: Catherine Day is an 82 y.o., female MRN: [id number] DOB: 06-21-1927 Height: 5\' 6"  (167.6 cm) Weight: 77.4 kg (170 lb 10.2 oz)              Insurance Information HMO: No   PPO:       PCP:       IPA:       80/20:       OTHER:   PRIMARY:  Medicare A/B      Policy#: [id number]      Subscriber: Catherine Day CM Name:        Phone#:       Fax#:   Pre-Cert#:        Employer: Retired Benefits:  Phone #:       Name:   Eff. Date: 07/17/94     Deduct: $1318      Out of Pocket Max: none      Life Max: unlimited CIR: 100%      SNF: 100 days Outpatient: 80%     Co-Pay: 20% Home Health: 100%      Co-Pay: none DME: 80%     Co-Pay: 20% Providers: patient's choice  SECONDARY: Tricare      Policy#:        Subscriber:   CM Name:        Phone#:       Fax#:   Pre-Cert#:        Employer:   Benefits:  Phone #:       Name:   Eff. Date:       Deduct:        Out of Pocket Max:        Life Max:   CIR:        SNF:   Outpatient:       Co-Pay:   Home Health:        Co-Pay:   DME:       Co-Pay:    Emergency Contact Information Contact Information    Name Relation Home Work Mobile   Muntean,Catherine Daughter [phone]     Satterwhite,Patrick Son [phone]       Current Medical History  Patient Admitting Diagnosis:  Scattered acute infarct left hemisphere primarily in the left temporal occipital lobes, bilateral MCA and left cerebellar artery territories   History of Present Illness: An 82 y.o.right handed femalewith history of COPD.Per chart review patient lives in [place] with her daughter. Independent prior to admission. She had been visiting a son in [place].Presented 04/10/2016 with sudden onset of right-sided weakness and inability to speak. 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 No associated mass  effect. Small number of other scattered small infarcts in the bilateral MCA and left cerebellar artery territories. Patient did receive TPA. MRI showed possible occlusion of the left PCA P3 inferior division.. Echocardiogram with systolic function mildly reduced. Grade 1 diastolic dysfunction. Underwent bilateral common carotid arteriograms followed by complete revascularization of occluded left MCA, left ACA and left ICA terminus. Neurology follow-up currently maintained on aspirin for CVA prophylaxis.TEE completed 04/13/2016 showing no intracardiac source of embolism or shunt. MBS completed 04/12/2016 advised mechanical soft honey thick liquid diet. Physical and occupational therapy evaluations completed with recommendations of physical medicine rehabilitation consult.  Patient to be admitted for a comprehensive inpatient rehabilitation program.    Total: 1=NIH  Past Medical History  Past Medical History:  Diagnosis Date  . COPD (chronic obstructive pulmonary disease) (HCC)     Family History  family history is not on file.  Prior Rehab/Hospitalizations: [place] 2015 [place]  Has the patient had major surgery during 100 days prior to admission? No  Current Medications   Current Facility-Administered Medications:  .  0.9 %  sodium chloride infusion, , Intravenous, Continuous, Rejeana BrockMcNeill P Kirkpatrick, MD, Last Rate: 75 mL/hr at 04/13/16 0600 .  acetaminophen (TYLENOL) tablet 650 mg, 650 mg, Oral, Q4H PRN **OR** acetaminophen (TYLENOL) suppository 650 mg, 650 mg, Rectal, Q4H PRN, Rejeana BrockMcNeill P Kirkpatrick, MD .  aspirin tablet 325 mg, 325 mg, Oral, Daily, David L Rinehuls, PA-C, 325 mg at 04/14/16 1102 .  atorvastatin (LIPITOR) tablet 20 mg, 20 mg, Oral, q1800, David L Rinehuls, PA-C, 20 mg at 04/13/16 1716 .  enoxaparin (LOVENOX) injection 40 mg, 40 mg, Subcutaneous, Q24H, Layne BentonSharon L Biby, NP .  fluticasone furoate-vilanterol (BREO ELLIPTA) 100-25 MCG/INH 1 puff, 1 puff, Inhalation, Daily,  David L Rinehuls, PA-C, 1 puff at 04/14/16 0725 .  levalbuterol (XOPENEX) nebulizer solution 0.63 mg, 0.63 mg, Nebulization, Q6H PRN, Mosetta AnisMichael T Bitonti, RPH .  levalbuterol (XOPENEX) nebulizer solution 0.63 mg, 0.63 mg, Nebulization, TID, Kalman ShanMurali Ramaswamy, MD, 0.63 mg at 04/14/16 0725 .  pantoprazole (PROTONIX) injection 40 mg, 40 mg, Intravenous, QHS, Rejeana BrockMcNeill P Kirkpatrick, MD, 40 mg at 04/13/16 2054 .  potassium chloride (KLOR-CON) packet 40 mEq, 40 mEq, Oral, Once, Micki RileyPramod S Sethi, MD .  RESOURCE THICKENUP CLEAR, , Oral, PRN, Marvel PlanJindong Xu, MD .  sodium chloride flush (NS) 0.9 % injection 10-40 mL, 10-40 mL, Intracatheter, PRN, Marvel PlanJindong Xu, MD, 20 mL at 04/13/16 1748 .  tiotropium [facility](SPIRIVA) inhalation capsule 18 mcg, 18 mcg, Inhalation, Daily, Kinnie ScalesDavid L Rinehuls, PA-C, 18 mcg at 04/14/16 0725 .  tobramycin (PF) (TOBI) nebulizer solution 300 mg, 300 mg, Nebulization, Daily, David L Rinehuls, PA-C, 300 mg at 04/11/16 1238  Patients Current Diet: DIET DYS 3 Room service appropriate? Yes; Fluid consistency: Honey Thick  Precautions / Restrictions Precautions Precautions: Fall Precaution Comments: on 2L 02 at baseline Restrictions Weight Bearing Restrictions: No   Has the patient had 2 or more falls or a fall with injury in the past year?YesDaughter reports 3 falls with a hairline pelvic fracture with a previous fall.  Prior Activity Level Limited Community (1-2x/wk): Goes out about 1 X a week, mostly home bound.  Home Assistive Devices / Equipment Home Assistive Devices/Equipment: Environmental consultantWalker (specify type), Wheelchair Home Equipment: Walker - 4 wheels (oxygen)  Prior Device Use: Indicate devices/aids used by the patient prior to current illness, exacerbation or injury? None  Prior Functional Level Prior Function Level of Independence: Independent Comments: per pt  Self Care: Did the patient need help bathing, dressing, using the toilet or eating?  Needed some help.  Daughter provides  supervision.  Indoor Mobility: Did the patient need assistance with walking from room to room (with or without device)? Independent  Stairs: Did the patient need assistance with internal or external stairs (with or without device)? Dependent.  Patient unable to tolerate stair climbing.  Functional Cognition: Did the patient need help planning regular tasks such as shopping or remembering to take medications? Needed some help.  Daughter helps with medications and with shopping lists/purchases.  Current Functional Level Cognition  Arousal/Alertness: Awake/alert Overall Cognitive Status: Impaired/Different from baseline Current Attention Level: Sustained Orientation Level: Oriented to person, Disoriented to situation, Disoriented to place,  Oriented to time Safety/Judgement: Decreased awareness of safety, Decreased awareness of deficits General Comments: family in room and reports she is not near her baseline. Attention: Sustained Sustained Attention: Appears intact Memory: Impaired Memory Impairment: Storage deficit, Retrieval deficit Awareness: Impaired Awareness Impairment: Emergent impairment, Intellectual impairment, Anticipatory impairment Problem Solving: Impaired Problem Solving Impairment: Verbal basic Safety/Judgment: Impaired    Extremity Assessment (includes Sensation/Coordination)  Upper Extremity Assessment: Generalized weakness  Lower Extremity Assessment: Defer to PT evaluation RLE Deficits / Details: AROM WFL; grossly 4/5 LLE Deficits / Details: AROM WFL; ankle DF 5/5    ADLs  Overall ADL's : Needs assistance/impaired Eating/Feeding: Independent, Sitting Grooming: Brushing hair, Sitting, Set up Upper Body Bathing: Minimal assitance, Sitting Lower Body Bathing: Minimal assistance, Sit to/from stand Upper Body Dressing : Minimal assistance, Sitting Upper Body Dressing Details (indicate cue type and reason): front opening gown Lower Body Dressing: Minimal  assistance, Sit to/from stand Lower Body Dressing Details (indicate cue type and reason): pt able to cross foot over opposite knee to don and doff socks Toilet Transfer: Min guard, Ambulation, RW Toilet Transfer Details (indicate cue type and reason): simulated to chair Toileting- Clothing Manipulation and Hygiene: Supervision/safety, Sitting/lateral lean Functional mobility during ADLs: Minimal assistance, Rolling walker General ADL Comments: Per daughter, pt required a lot of encouragement to shower, but once in the shower, performed by herself.     Mobility  Overal bed mobility: Needs Assistance Bed Mobility: Supine to Sit Supine to sit: Supervision, HOB elevated General bed mobility comments: Pt sitting up in recliner with family present when PT arrived.     Transfers  Overall transfer level: Needs assistance Equipment used: Rolling walker (2 wheeled) Transfers: Sit to/from Stand Sit to Stand: Min assist Stand pivot transfers: Min guard General transfer comment: VC's for hand placement on seated surface for safety. Pt required assist for controlled descent to chair.     Ambulation / Gait / Stairs / Wheelchair Mobility  Ambulation/Gait Ambulation/Gait assistance: Architect (Feet): 60 Feet Assistive device: Rolling walker (2 wheeled) Gait Pattern/deviations: Step-through pattern, Decreased stride length, Trunk flexed, Trendelenburg General Gait Details: VC's for improved posture and general safety with the RW. 1 seated rest break required due to fatigue and DOE. Gait velocity: Decreased Gait velocity interpretation: Below normal speed for age/gender    Posture / Balance Balance Overall balance assessment: Needs assistance Sitting-balance support: Feet supported, No upper extremity supported Sitting balance-Leahy Scale: Fair Standing balance support: No upper extremity supported, During functional activity Standing balance-Leahy Scale: Poor Standing balance  comment: Requires UE support for safety and balance.     Special needs/care consideration BiPAP/CPAP Yes, but she does not wear it at home, so not using in hospital CPM No Continuous Drip IV No Dialysis No         Life Vest No Oxygen Yes, 02 2L at home all the time due to COPD Special Bed No Trach Size No Wound Vac (area) No       Skin Fragile skin                           Bowel mgmt: Last BM 04/14/16 per daughter and patient Bladder mgmt: Voiding up on [facility] with assistance Diabetic mgmt No    Previous Home Environment Living Arrangements: Children  Lives With: Daughter (in [place]) Available Help at Discharge: Family, Available PRN/intermittently Home Care Services: No Additional Comments: pt reports she lives in [place]; visiting her son  Discharge Living Setting Plans for Discharge Living Setting: Patient's home, House, Lives with (comment) (Lives with daughter in [place] Type of Home at Discharge: House Discharge Home Layout: One level Discharge Home Access: Stairs to enter Entrance Stairs-Number of Steps: 2 Does the patient have any problems obtaining your medications?: No  Social/Family/Support Systems Patient Roles: Parent (Has a daughter in [place] and a son in [place] Contact Information: Franne FortsLinda Day - daughter Anticipated Caregiver: daughter and hired help Anticipated Industrial/product designerCaregiver's Contact Information: Bonita QuinLinda - daughter - [phone] Ability/Limitations of Caregiver: Dtr works 8 am to 6 pm and has stop in help 2 X a week for 5 hours at a time. Caregiver Availability: Intermittent Discharge Plan Discussed with Primary Caregiver: Yes Is Caregiver In Agreement with Plan?: Yes Does Caregiver/Family have Issues with Lodging/Transportation while Pt is in Rehab?: No  Goals/Additional Needs Patient/Family Goal for Rehab: PT/OT/SLP mod I and supervision goals Expected length of stay: 10-14 days Cultural Considerations: None Dietary Needs: Dys 3, honey thick  liquids Equipment Needs: TBD Pt/Family Agrees to Admission and willing to participate: Yes Program Orientation Provided & Reviewed with Pt/Caregiver Including Roles  & Responsibilities: Yes  Decrease burden of Care through IP rehab admission: N/A  Possible need for SNF placement upon discharge: Yes, if patient cannot progress to where daughter can take her home to [place].  Patient Condition: This patient's medical and functional status has changed since the consult dated 04/13/16 in which the Rehabilitation Physician documented that the patient was not appropriate for intensive rehabilitative care in an inpatient rehabilitation facility. Due to change in status and issues being addressed, patient's case has been discussed with Dr. Riley KillSwartz and patient now appropriate for inpatient rehabilitation. Will admit to inpatient rehab today.  Preadmission Screen Completed By:  Trish MageLogue, Isak Sotomayor M, 04/14/2016 12:43 PM ______________________________________________________________________   Discussed status with Dr. Riley KillSwartz on 04/14/16 at 1241 and received telephone approval for admission today.  Admission Coordinator:  Trish MageLogue, Jetson Pickrel M, time1241/Date11/28/17

## 2016-04-14 NOTE — Progress Notes (Signed)
Ankit Karis Juba, MD Physician Signed Physical Medicine and Rehabilitation  Consult Note Date of Service: 04/13/2016 6:11 AM  Related encounter: ED to Hosp-Admission (Discharged) from 04/09/2016 in [facility] 50M NEURO MEDICAL     Expand All Collapse All   [] Hide copied text [] Hover for attribution information      Physical Medicine and Rehabilitation Consult Reason for Consult: small volume of scattered acute infarct left hemisphere primarily in the left temporal occipital lobes. Petechial hemorrhage in the mesial left temporal lobe tracking to the cauda thalamic groove. Scattered small infarcts bilateral MCA and left cerebellar artery territories Referring Physician: Triad   HPI: Catherine Day is a 82 y.o. right handed female with history of COPD. Per chart review patient lives in [place] with her daughter. Independent prior to admission. She had been visiting a son in [place]. Presented 04/10/2016 with sudden onset of right-sided weakness and inability to speak. CT/ MRI showed small volume of scattered acute infarct in the left hemisphere primarily in the left temporal and occipital lobes with petechial hemorrhage in the left temporal lobe. No associated mass effect. Small number of other scattered small infarcts in the bilateral MCA and left cerebellar artery territories. Patient did receive TPA. MRI showed possible occlusion of the left PCA P3 inferior division.. Echocardiogram with systolic function mildly reduced. Grade 1 diastolic dysfunction. Underwent bilateral common carotid arteriograms followed by complete revascularization of occluded left MCA, left ACA and left ICA terminus. Neurology follow-up currently maintained on aspirin for CVA prophylaxis. MBS completed 04/12/2016 advised mechanical soft honey thick liquid diet. Physical and occupational therapy evaluations completed with recommendations of physical medicine rehabilitation consult.   Review of  Systems  Constitutional: Negative for chills and fever.  HENT: Negative for hearing loss and tinnitus.   Eyes: Negative for blurred vision and double vision.  Respiratory: Positive for shortness of breath. Negative for cough.   Cardiovascular: Positive for leg swelling. Negative for chest pain and palpitations.  Gastrointestinal: Positive for constipation. Negative for nausea and vomiting.  Genitourinary: Negative for dysuria, flank pain and hematuria.  Musculoskeletal: Positive for joint pain and myalgias.  Skin: Negative for rash.  Neurological: Positive for speech change and weakness. Negative for seizures and loss of consciousness.  Psychiatric/Behavioral: Positive for memory loss.  All other systems reviewed and are negative.      Past Medical History:  Diagnosis Date  . COPD (chronic obstructive pulmonary disease) (HCC)         Past Surgical History:  Procedure Laterality Date  . PELVIC FRACTURE SURGERY    . RADIOLOGY WITH ANESTHESIA N/A 04/09/2016   Procedure: RADIOLOGY WITH ANESTHESIA;  Surgeon: Julieanne Cotton, MD;  Location: [facility] OR;  Service: Radiology;  Laterality: N/A;   History reviewed. No pertinent family history related to early CVA Social History:  reports that she has never smoked. She has never used smokeless tobacco. She reports that she does not drink alcohol or use drugs. Allergies: No Known Allergies       Medications Prior to Admission  Medication Sig Dispense Refill  . Calcium Citrate-Vitamin D (CITRACAL + D PO) Take 1 tablet by mouth daily.    Marland Kitchen denosumab (PROLIA) 60 MG/ML SOLN injection Inject 60 mg into the skin every 6 (six) months. Administer in upper arm, thigh, or abdomen Last injection 03/24/16    . fluticasone furoate-vilanterol (BREO ELLIPTA) 100-25 MCG/INH AEPB Inhale 1 puff into the lungs daily.    Marland Kitchen ipratropium-albuterol (DUONEB) 0.5-2.5 (3) MG/3ML SOLN Take 3  mLs by nebulization See admin instructions. Inhale 1 vial by  nebulization every morning and may also use 2 more times during the day as needed for shortness of breath or wheezing    . levalbuterol (XOPENEX HFA) 45 MCG/ACT inhaler Inhale 1-2 puffs into the lungs every 4 (four) hours as needed for wheezing or shortness of breath.    . methylPREDNISolone (MEDROL DOSEPAK) 4 MG TBPK tablet Take 4-8 mg by mouth See admin instructions. Tapered course started 04/09/16 am -Day 1: take 2 tablets (8 mg) by mouth with breakfast, 1 tablet (4 mg) with lunch and supper and 2 tablets at bedtime; Day 2: take 1 tablet with breakfast, lunch and supper and 2 tablets at bedtime; Day 3: take 1 tablet with breakfast, lunch, supper and at bedtime; Day 4: take 1 tablet with breakfast, lunch and at bedtime; Day 5: take 1 tablet with breakfast and at bedtime; Day 6: take 1 tablet with breakfast  0  . OXYGEN Inhale 2 L into the lungs continuous.    Marland Kitchen. tiotropium (SPIRIVA) 18 MCG inhalation capsule Place 18 mcg into inhaler and inhale daily.    Marland Kitchen. tobramycin, PF, (TOBI) 300 MG/5ML nebulizer solution Take 300 mg by nebulization daily.    . valsartan-hydrochlorothiazide (DIOVAN-HCT) 80-12.5 MG tablet Take 1 tablet by mouth daily.      Home: Home Living Family/patient expects to be discharged to:: Private residence Living Arrangements: Children (lives with her daughter in [place]) Available Help at Discharge: Family, Available PRN/intermittently Home Equipment: Dan HumphreysWalker - 4 wheels (oxygen) Additional Comments: pt reports she lives in [place]; visiting her son  Functional History: Prior Function Level of Independence: Independent Comments: per pt Functional Status:  Mobility: Bed Mobility Overal bed mobility: Needs Assistance Bed Mobility: Supine to Sit Supine to sit: Supervision, HOB elevated General bed mobility comments: no physical assist needed, used rail Transfers Overall transfer level: Needs assistance Equipment used: None Transfers: Sit to/from Stand, Stand Pivot  Transfers Sit to Stand: Min guard Stand pivot transfers: Min guard General transfer comment: min guard for safety, flexed posture Ambulation/Gait General Gait Details: unable with 1 person due to multiple ICU lines    ADL: ADL Overall ADL's : Needs assistance/impaired Eating/Feeding: Independent, Sitting Grooming: Brushing hair, Sitting, Set up Upper Body Bathing: Minimal assitance, Sitting Lower Body Bathing: Minimal assistance, Sit to/from stand Upper Body Dressing : Minimal assistance, Sitting Lower Body Dressing: Minimal assistance, Sit to/from stand Lower Body Dressing Details (indicate cue type and reason): pt able to cross foot over opposite knee to don and doff socks Toilet Transfer: Min guard, Stand-pivot, BSC Toileting- Clothing Manipulation and Hygiene: Supervision/safety, Sitting/lateral lean  Cognition: Cognition Overall Cognitive Status: No family/caregiver present to determine baseline cognitive functioning Orientation Level: Oriented to person, Disoriented to place, Disoriented to time, Disoriented to situation Cognition Arousal/Alertness: Awake/alert Behavior During Therapy: WFL for tasks assessed/performed Overall Cognitive Status: No family/caregiver present to determine baseline cognitive functioning Area of Impairment: Orientation, Safety/judgement Orientation Level: Time, Situation Current Attention Level: Sustained Safety/Judgement: Decreased awareness of safety, Decreased awareness of deficits Awareness:  (pre-intellectual) Problem Solving: Slow processing, Requires verbal cues General Comments: thought she was in hospital because "they found a lump"  Blood pressure (!) 131/51, pulse 83, temperature 98.4 F (36.9 C), temperature source Oral, resp. rate (!) 24, height 5\' 6"  (1.676 m), weight 77.4 kg (170 lb 10.2 oz), SpO2 99 %. Physical Exam  Vitals reviewed. Constitutional: She appears well-developed.  Obese  HENT:  Head: Normocephalic and  atraumatic.  Eyes:  Conjunctivae and EOM are normal.  Neck: Normal range of motion. Neck supple. No thyromegaly present.  Cardiovascular: Normal rate and regular rhythm.   Respiratory: Effort normal and breath sounds normal.  +[place]  GI: Soft. Bowel sounds are normal. She exhibits no distension.  Musculoskeletal: She exhibits no edema or tenderness.  sit/stand, transfers with supervision  Neurological: She is alert.  A&Ox1 Impulsive Motor: >4/5 throughout   Skin: Skin is warm and dry.  Psychiatric: She has a normal mood and affect. Her behavior is normal.    Lab Results Last 24 Hours       Results for orders placed or performed during the hospital encounter of 04/09/16 (from the past 24 hour(s))  Iron and TIBC     Status: Abnormal   Collection Time: 04/12/16  3:00 PM  Result Value Ref Range   Iron 24 (L) 28 - 170 ug/dL   TIBC 161 (L) 096 - 045 ug/dL   Saturation Ratios 11 10.4 - 31.8 %   UIBC 204 ug/dL  CBC     Status: Abnormal   Collection Time: 04/13/16  4:32 AM  Result Value Ref Range   WBC 11.1 (H) 4.0 - 10.5 K/uL   RBC 3.69 (L) 3.87 - 5.11 MIL/uL   Hemoglobin 9.9 (L) 12.0 - 15.0 g/dL   HCT 40.9 (L) 81.1 - 91.4 %   MCV 84.6 78.0 - 100.0 fL   MCH 26.8 26.0 - 34.0 pg   MCHC 31.7 30.0 - 36.0 g/dL   RDW 78.2 95.6 - 21.3 %   Platelets 173 150 - 400 K/uL  Basic metabolic panel     Status: Abnormal   Collection Time: 04/13/16  4:32 AM  Result Value Ref Range   Sodium 136 135 - 145 mmol/L   Potassium 3.2 (L) 3.5 - 5.1 mmol/L   Chloride 108 101 - 111 mmol/L   CO2 23 22 - 32 mmol/L   Glucose, Bld 114 (H) 65 - 99 mg/dL   BUN 11 6 - 20 mg/dL   Creatinine, Ser 0.86 0.44 - 1.00 mg/dL   Calcium 6.9 (L) 8.9 - 10.3 mg/dL   GFR calc non Af Amer >60 >60 mL/min   GFR calc Af Amer >60 >60 mL/min   Anion gap 5 5 - 15      Imaging Results (Last 48 hours)  Dg Chest Port 1 View  Result Date: 04/11/2016 CLINICAL DATA:  82 year old female with  respiratory failure EXAM: PORTABLE CHEST 1 VIEW COMPARISON:  Prior chest x-ray 04/09/2016 FINDINGS: The patient has been extubated and the nasogastric tube removed. Inspiratory volumes are lower. Nonspecific patchy opacities in the right greater than left base. Stable cardiomegaly. Mild pulmonary vascular congestion without edema. Atherosclerotic, ectatic and tortuous thoracic aorta. Right IJ approach central venous catheter remains in unchanged position. The tip of the catheter is at the superior cavoatrial junction. Osseous structures are intact and unremarkable. IMPRESSION: 1. Interval extubation and removal of nasogastric tube. Right IJ central venous catheter remains in stable and satisfactory position. 2. Lower inspiratory volumes with increased bibasilar atelectasis and pulmonary vascular congestion. 3.  Aortic Atherosclerosis (ICD10-170.0) Electronically Signed   By: Malachy Moan M.D.   On: 04/11/2016 08:12   Dg Swallowing Func-speech Pathology  Result Date: 04/12/2016 Objective Swallowing Evaluation: Type of Study: MBS-Modified Barium Swallow Study Patient Details Name: Maidie Streight MRN: [id number] Date of Birth: [birth year] Today's Date: 04/12/2016 Time: SLP Start Time (ACUTE ONLY): 1116-SLP Stop Time (ACUTE ONLY): 1131 SLP Time Calculation (min) (ACUTE  ONLY): 15 min Past Medical History: Past Medical History: Diagnosis Date . COPD (chronic obstructive pulmonary disease) (HCC)  Past Surgical History: Past Surgical History: Procedure Laterality Date . PELVIC FRACTURE SURGERY   . RADIOLOGY WITH ANESTHESIA N/A 04/09/2016  Procedure: RADIOLOGY WITH ANESTHESIA;  Surgeon: Julieanne CottonSanjeev Deveshwar, MD;  Location: [facility] OR;  Service: Radiology;  Laterality: N/A; HPI: Ms [name] is an 82 yo female with h/o COPD admitted with mental status change, right sided weakness, hemianopsia, left gaze preference,  Pt required intubation and was extubated yesterday.  Pt imaging studies showed multiple CVAs in areas of left  cerebellum, bilateral MCA, left temporal/occipital - believed to be embolic.  Swallow and speech eval ordered Subjective: pt awake, pleasant Assessment / Plan / Recommendation CHL IP CLINICAL IMPRESSIONS 04/12/2016 Therapy Diagnosis Moderate pharyngeal phase dysphagia Clinical Impression Pt has a moderate pharyngeal dysphagia due in large part to delayed swallow initiation and decreased sensation of airway penetration. Liquids spill to the pyriform sinuses, resulting in deep, silent penetration of thin and nectar thick liquids. Although this does not occur with every bolus, it is silent and unable to be completely cleared with cued coughing, making her at a higher risk for an aspiration-related infection. Her strength is adequate with no significant residuals remaining post-swallow. Recommend Dys 3 diet and honey thick liquids. SLP to f/u for tolerance and readiness to advance. Impact on safety and function Moderate aspiration risk   CHL IP TREATMENT RECOMMENDATION 04/12/2016 Treatment Recommendations Therapy as outlined in treatment plan below   Prognosis 04/12/2016 Prognosis for Safe Diet Advancement Good Barriers to Reach Goals -- Barriers/Prognosis Comment -- CHL IP DIET RECOMMENDATION 04/12/2016 SLP Diet Recommendations Dysphagia 3 (Mech soft) solids;Honey thick liquids Liquid Administration via Cup;No straw Medication Administration Whole meds with puree Compensations Slow rate;Small sips/bites Postural Changes Remain semi-upright after after feeds/meals (Comment);Seated upright at 90 degrees   CHL IP OTHER RECOMMENDATIONS 04/12/2016 Recommended Consults -- Oral Care Recommendations Oral care BID Other Recommendations Order thickener from pharmacy;Prohibited food (jello, ice cream, thin soups);Remove water pitcher   CHL IP FOLLOW UP RECOMMENDATIONS 04/12/2016 Follow up Recommendations Inpatient Rehab   CHL IP FREQUENCY AND DURATION 04/12/2016 Speech Therapy Frequency (ACUTE ONLY) min 2x/week Treatment Duration  2 weeks      CHL IP ORAL PHASE 04/12/2016 Oral Phase WFL Oral - Pudding Teaspoon -- Oral - Pudding Cup -- Oral - Honey Teaspoon -- Oral - Honey Cup -- Oral - Nectar Teaspoon -- Oral - Nectar Cup -- Oral - Nectar Straw -- Oral - Thin Teaspoon -- Oral - Thin Cup -- Oral - Thin Straw -- Oral - Puree -- Oral - Mech Soft -- Oral - Regular -- Oral - Multi-Consistency -- Oral - Pill -- Oral Phase - Comment --  CHL IP PHARYNGEAL PHASE 04/12/2016 Pharyngeal Phase Impaired Pharyngeal- Pudding Teaspoon -- Pharyngeal -- Pharyngeal- Pudding Cup -- Pharyngeal -- Pharyngeal- Honey Teaspoon -- Pharyngeal -- Pharyngeal- Honey Cup Delayed swallow initiation-pyriform sinuses Pharyngeal -- Pharyngeal- Nectar Teaspoon -- Pharyngeal -- Pharyngeal- Nectar Cup Delayed swallow initiation-pyriform sinuses;Penetration/Aspiration before swallow Pharyngeal Material enters airway, remains ABOVE vocal cords and not ejected out Pharyngeal- Nectar Straw -- Pharyngeal -- Pharyngeal- Thin Teaspoon -- Pharyngeal -- Pharyngeal- Thin Cup Delayed swallow initiation-pyriform sinuses;Penetration/Aspiration before swallow Pharyngeal Material enters airway, CONTACTS cords and not ejected out Pharyngeal- Thin Straw -- Pharyngeal -- Pharyngeal- Puree Delayed swallow initiation-vallecula Pharyngeal -- Pharyngeal- Mechanical Soft Delayed swallow initiation-vallecula Pharyngeal -- Pharyngeal- Regular -- Pharyngeal -- Pharyngeal- Multi-consistency -- Pharyngeal -- Pharyngeal- Pill -- Pharyngeal --  Pharyngeal Comment --  CHL IP CERVICAL ESOPHAGEAL PHASE 04/12/2016 Cervical Esophageal Phase WFL Pudding Teaspoon -- Pudding Cup -- Honey Teaspoon -- Honey Cup -- Nectar Teaspoon -- Nectar Cup -- Nectar Straw -- Thin Teaspoon -- Thin Cup -- Thin Straw -- Puree -- Mechanical Soft -- Regular -- Multi-consistency -- Pill -- Cervical Esophageal Comment -- No flowsheet data found. [name] 04/12/2016, 1:05 PM  [name], M.A. CCC-SLP [phone]                 Assessment/Plan: Diagnosis: Scattered acute infarct left hemisphere primarily in the left temporal occipital lobes, bilateral MCA and left cerebellar artery territories Labs and images independently reviewed.  Records reviewed and summated above. Stroke: Continue secondary stroke prophylaxis and Risk Factor Modification listed below:   Antiplatelet therapy:   Blood Pressure Management:  Continue current medication with prn's with permisive HTN per primary team Statin Agent:   Prediabetes management:    1. Does the need for close, 24 hr/day medical supervision in concert with the patient's rehab needs make it unreasonable for this patient to be served in a less intensive setting? No  2. Co-Morbidities requiring supervision/potential complications: complete revascularization of occluded left MCA, left ACA and left ICA terminus, post-stroke dysphagia (advance diet as tolerated), Grade 1 diastolic dysfunction (monitor weights), COPD (monitor O2 sats and RR with increased activity), leukocytosis (cont to monitor for signs and symptoms of infection, further workup if indicated), ABLA (transfuse if necessary to ensure appropriate perfusion for increased activity tolerance), hypokalemia (continue to monitor and replete as necessary) 3. Due to safety, disease management and patient education, does the patient require 24 hr/day rehab nursing? Potentially 4. Does the patient require coordinated care of a physician, rehab nurse, PT (1-2 hrs/day, 5 days/week), OT (1-2 hrs/day, 5 days/week) and SLP (1-2 hrs/day, 5 days/week) to address physical and functional deficits in the context of the above medical diagnosis(es)? Potentially Addressing deficits in the following areas: balance, endurance, locomotion, strength, transferring, toileting, swallowing and psychosocial support 5. Can the patient actively participate in an intensive therapy program of at least 3 hrs of therapy per day at least 5 days per week?  Yes 6. The potential for patient to make measurable gains while on inpatient rehab is fair 7. Anticipated functional outcomes upon discharge from inpatient rehab are n/a  with PT, n/a with OT, n/a with SLP. 8. Estimated rehab length of stay to reach the above functional goals is: NA 9. Does the patient have adequate social supports and living environment to accommodate these discharge functional goals? N/A 10. Anticipated D/C setting: Home 11. Anticipated post D/C treatments: HH therapy and Home excercise program 12. Overall Rehab/Functional Prognosis: good  RECOMMENDATIONS: This patient's condition is appropriate for continued rehabilitative care in the following setting: Pt states she is at baseline, able to sit/stand, transfer with supervision.  At present, pt does not appear to require CIR.  Recommend home with [facility] and supervision, if supervision, not available, may consider SNF with PM&R follow up as outpt. Patient has agreed to participate in recommended program. Potentially Note that insurance prior authorization may be required for reimbursement for recommended care.  Comment: Rehab Admissions Coordinator to follow up.  Maryla Morrow, MD, [place] 04/13/2016

## 2016-04-14 NOTE — Progress Notes (Signed)
Patient transported to 4W by bed with all belongings. Daughter at bedside. Report given to [place].

## 2016-04-14 NOTE — Progress Notes (Signed)
Rehab admissions - I have discussed all findings with Dr. Riley KillSwartz and he has approved acute inpatient rehab admission for today.  I have clearance from Dr. Pearlean BrownieSethi for admission to rehab today as well.  Bed available and will admit to acute inpatient rehab today.  Call me for questions.  #[phone]

## 2016-04-14 NOTE — Care Management (Signed)
Met with the patients daughter and she was able to provide the patients insurance information. Copies made and sent to financial counseling. CM discussed the d/c plan with the daughter and she is hoping for CIR. PT asking CIR to relook at the patient for possible admission. If this is not an option the daughter would like SNF rehab. The patient's son who lives local is not able to provide the care his mother currently needs. The patients daughter is going to have to return to [place] and is not comfortable transporting her mother until she is doing better physically and mentally. CSW informed of potential SNF need. CM following.

## 2016-04-14 NOTE — Progress Notes (Signed)
Pharmacy Antibiotic Note  Catherine ClossRegina Niblack is a 82 y.o. female admitted to inpatient rehab on 04/14/2016.  [facility] with CVA.  On arrival to rehab pt developed SOB and increased O2 requirement.  Pharmacy has been consulted for Zosyn dosing for possible HCAP.  Plan: Zosyn 3.375g IV q8h (4 hour infusion).  Height: 5\' 6"  (167.6 cm) Weight: 201 lb (91.2 kg) IBW/kg (Calculated) : 59.3  Temp (24hrs), Avg:97.9 F (36.6 C), Min:97.6 F (36.4 C), Max:98.1 F (36.7 C)   Recent Labs Lab 04/10/16 0636 04/11/16 0530 04/12/16 0220 04/13/16 0432 04/14/16 0530 04/14/16 1534  WBC 16.6* 15.1* 10.3 11.1*  --  10.0  CREATININE 0.92 0.71 0.63 0.67 0.50 0.70    Estimated Creatinine Clearance: 57.5 mL/min (by C-G formula based on SCr of 0.7 mg/dL).    No Known Allergies  Antimicrobials this admission: Zosyn 11/28 >>   Dose adjustments this admission:   Microbiology results: 11/23 MRSA PCR negative   Thank you for allowing pharmacy to be a part of this patient's care.  Toys 'R' UsKimberly Nissi Doffing, Pharm.D., BCPS Clinical Pharmacist Pager [phone] 04/14/2016 10:47 PM

## 2016-04-14 NOTE — Progress Notes (Signed)
04/14/16 1525  Nursing To Rehab unit alert patient with 02 on at 2 L assisted by RN , NT and daughter. Oriented to unit set up. Fall contract signed by daughter; Bed alarm placed.

## 2016-04-14 NOTE — Discharge Summary (Signed)
Stroke Discharge Summary  Patient ID: Catherine Day    l   MRN: [id number]      DOB: 08/06/1927  Date of Admission: 04/09/2016 Date of Discharge: 04/14/2016  Attending Physician:  Micki RileyPramod S Mashell Sieben, MD, Stroke MD Consulting Physician(s):   Silver HugueninJose Angelo A de Dio, MD (pulmonary/intensive care), Simonne MaffucciSanjeev Alinda Money(Tony) Corliss Skainseveshwar, MD (Interventional Neuroradiologist), Claudette LawsAndrew Kirsteins, MD (Physical Medicine & Rehabtilitation) Patient's PCP:  No primary care provider on file.  Discharge Diagnoses:  Principal Problem:   Embolic stroke (HCC) - B MCA and L PCA  S/p iv TPA administration  and complete revascularization of occluded LT MCA ,Lt ACA and Lt ICA terminus with x1 PASS WITH A COMBINATION OF solitaire 4mm x 40 mm retrieval device  with mechanical embolectomy Active Problems:   Diastolic dysfunction   Chronic obstructive pulmonary disease (HCC)   Acute blood loss anemia   Leukocytosis   Hypokalemia   Hypertension   Hyperlipidemia   Extravasation injury of IV catheter site with other complication, initial encounter (HCC) L arm    Past Medical History:  Diagnosis Date  . COPD (chronic obstructive pulmonary disease) (HCC)    Past Surgical History:  Procedure Laterality Date  . IR GENERIC HISTORICAL  04/09/2016   IR ANGIO INTRA EXTRACRAN SEL COM CAROTID INNOMINATE UNI R MOD SED 04/09/2016 Julieanne CottonSanjeev Deveshwar, MD [facility]-INTER[facility]  . IR GENERIC HISTORICAL  04/09/2016   IR PERCUTANEOUS ART THROMBECTOMY/INFUSION INTRACRANIAL INC DIAG ANGIO 04/09/2016 Julieanne CottonSanjeev Deveshwar, MD [facility]  . PELVIC FRACTURE SURGERY    . RADIOLOGY WITH ANESTHESIA N/A 04/09/2016   Procedure: RADIOLOGY WITH ANESTHESIA;  Surgeon: Julieanne CottonSanjeev Deveshwar, MD;  Location: [facility] OR;  Service: Radiology;  Laterality: N/A;  . TEE WITHOUT CARDIOVERSION N/A 04/13/2016   Procedure: TRANSESOPHAGEAL ECHOCARDIOGRAM (TEE);  Surgeon: Thurmon FairMihai Croitoru, MD;  [facility];  Service: Cardiovascular;  Laterality: N/A;    Medications to be continued  on Rehab . aspirin  325 mg Oral Daily  . atorvastatin  20 mg Oral q1800  . enoxaparin (LOVENOX) injection  40 mg Subcutaneous Q24H  . fluticasone furoate-vilanterol  1 puff Inhalation Daily  . levalbuterol  0.63 mg Nebulization TID  . pantoprazole (PROTONIX) IV  40 mg Intravenous QHS  . tiotropium  18 mcg Inhalation Daily  . tobramycin (PF)  300 mg Nebulization Daily    LABORATORY STUDIES CBC    Component Value Date/Time   WBC 11.1 (H) 04/13/2016 0432   RBC 3.69 (L) 04/13/2016 0432   HGB 9.9 (L) 04/13/2016 0432   HCT 31.2 (L) 04/13/2016 0432   PLT 173 04/13/2016 0432   MCV 84.6 04/13/2016 0432   MCH 26.8 04/13/2016 0432   MCHC 31.7 04/13/2016 0432   RDW 14.1 04/13/2016 0432   LYMPHSABS 1.6 04/10/2016 0636   MONOABS 0.8 04/10/2016 0636   EOSABS 0.1 04/10/2016 0636   BASOSABS 0.0 04/10/2016 0636   CMP    Component Value Date/Time   NA 139 04/14/2016 0530   K 3.2 (L) 04/14/2016 0530   CL 110 04/14/2016 0530   CO2 23 04/14/2016 0530   GLUCOSE 98 04/14/2016 0530   BUN 5 (L) 04/14/2016 0530   CREATININE 0.50 04/14/2016 0530   CALCIUM 6.9 (L) 04/14/2016 0530   PROT 7.5 04/09/2016 1827   ALBUMIN 3.2 (L) 04/09/2016 1827   AST 22 04/09/2016 1827   ALT 17 04/09/2016 1827   ALKPHOS 61 04/09/2016 1827   BILITOT 0.7 04/09/2016 1827   GFRNONAA >60 04/14/2016 0530   GFRAA >60 04/14/2016 0530  COAGS Lab Results  Component Value Date   INR 1.14 04/09/2016   Lipid Panel    Component Value Date/Time   CHOL 159 04/10/2016 0637   TRIG 76 04/10/2016 0637   HDL 52 04/10/2016 0637   CHOLHDL 3.1 04/10/2016 0637   VLDL 15 04/10/2016 0637   LDLCALC 92 04/10/2016 0637   HgbA1C  Lab Results  Component Value Date   HGBA1C 5.8 (H) 04/10/2016   Alcohol Level    Component Value Date/Time   ETH <5 04/09/2016 1825     SIGNIFICANT DIAGNOSTIC STUDIES Ct Head Code Stroke Wo Contrast` 04/09/2016 No acute finding.  ASPECTS is 10.   Cerebral angio 04/09/2016 S/P bilateral  common carotid arteriograms followed by complete revascularization of occluded LT MCA ,Lt ACA and Lt ICA terminus with x1 PASS WITH A COMBINATION OF solitaire 4mm x 40 mm retrieval device with simultaneous aspiration at the intermediate guide catheter and flow gate guide catheter, and 3.6 mg of superselective intracranial INTEGRELIN achieving a TICI 3 reperfusion  Ct Head Wo Contrast 04/09/2016 No large territory acute infarct or intracranial hemorrhage identified. Patchy foci of hypoattenuation in periventricular and subcortical white matter may represent areas of infarction or chronic microvascular ischemic changes and are stable. If clinically indicated MRI is more sensitive for acute ischemia.   MRI Head / MRA Brain 04/10/2016 1. Small volume of scattered acute infarcts in the left hemisphere primarily in the left temporal and occipital lobes   (note fetal type left PCA origin anatomy). 2. Petechial hemorrhage in the mesial left temporal lobe tracking to the cauda thalamic groove.              No associated mass effect and no malignant hemorrhagic transformation. 3. A small number of other scattered small infarcts in the bilateral MCA and left cerebellar artery territories might be          related to endovascular intervention. 4. Motion degraded intracranial MRA negative for emergent large vessel occlusion or proximal intracranial stenosis.              Possible occlusion of the left PCA P3 inferior division.  Dg Chest Port 1 View 04/10/2016 1. Endotracheal tube seen ending 2 cm above the carina.  2. Right IJ line noted ending about the distal SVC.  3. Enteric tube noted extending below the diaphragm.  4. Mild bibasilar atelectasis.     TEE No intracardiac source of embolism or shunt. Moderate atherosclerosis in the ascnding aorta and arch, but without frank plaque ulceration or thrombus.   HISTORY OF PRESENT ILLNESS Catherine Day an 82 y.o.femalewho was brought in by family  because of sudden onset of right sided weakness and inability to speak. The hx is obtained from the daughter and chart review. The daughter noticed the sx suddenly and called EMS. The patient has no prior h/o stroke. She was not routinely taking an ASA daily. She is generally functional except for impairment from her chronic lung disease. She was last known well 04/09/2016 at 17:30. She received IV TPA 04/09/2016 at 1900. She was taken to IR where she received TICI 3 complete revascularization of occluded LT MCA ,Lt ACA and Lt ICA terminus with x1 PASS solitaire retrieval device with simultaneous aspiration at the intermediate guide catheter and flow gate guide catheter, and 3.6 mg of superselective intracranial INTEGRELIN. She was admitted to neuro ICU for further evaluation and treatment.     HOSPITAL COURSE [name] is a 82 y.o. female with history  of COPD and hypertension presenting with sudden onset of right sided weakness and inability to speak.  She received IV t-PA and underwent mechanical thrombectomy with complete revascularization of occluded LT MCA ,Lt ACA and Lt ICA terminus.  Stroke:  Dominant left MCA territory and R MCA and L cerebellar artery territory infarcts s/p IV tPA and TICI 3 revascularization of occluded LT MCA ,Lt ACA and Lt ICA terminus, infarct embolic pattern secondary to likely cardioembolic source not identified at time of discarge  Resultant  Rapid neuro improvement,   Cerebral angiogram - complete revascularization of occluded LT MCA ,Lt ACA and Lt ICA terminus  MRI - Small volume of scattered acute infarcts in the left hemisphere. petechial hemorrhage in the left mesial temporal lobe. A small number of other scattered small infarcts in the bilateral MCA and left cerebellar artery territories   MRA - Possible occlusion of the left PCA P3 inferior division.  2D Echo - Systolic function was probably mildly reduced. No cardiac source of emboli  identified.  TEE no source of embolus  loop recorder implant deferred by cardiology. 30d monitor recommended upon d/c from CIR. Consider loop placement in [facility] if she returns there, or here later if she remains.  LDL - 92  HgbA1c - 5.8  No antithrombotic prior to admission, now on Aspirin 325 mg daily   Ongoing aggressive stroke risk factor management  Therapy recommendations: CIR  Disposition:  CIR  Hypertension  Stable  Hyperlipidemia  Home meds:  No lipid lowering medications prior to admission  LDL 92, goal < 70  Now on Lipitor 20 mg daily  Continue statin at discharge  Other Stroke Risk Factors  Advanced age  Other Active Problems  Contrast extravasation left arm after IV infiltrated  Leukocytosis - 16.6  -> 15.1 (afebrile) -> 11.1  Hypokalemia - 3.3 -> 3.2. Given 40 meq KCL x 2 today. Recheck in am  Anemia - 15.6 / 46 -> 9.9 / 31.2 - continue to monitor - check stool guaiacs and iron 21, TIBC 228, Sat 11%. Continue to monitor  Discontinue O2. Sat 100% on 2L. Monitor sats off oxygen.   DISCHARGE EXAM Blood pressure 115/62, pulse 88, temperature 97.8 F (36.6 C), temperature source Oral, resp. rate 20, height 5\' 6"  (1.676 m), weight 77.4 kg (170 lb 10.2 oz), SpO2 98 %. General - Well nourished, well developed,  Ophthalmologic - Fundi not visualized   Cardiovascular - Regular rate and rhythm. Neuro - , alert, Oriented to self only. Alert.  Speech is fluent. No aphasia. . Slight disorientation but follows all simple midline and peripheral commands. Eyes in mid position, able to attend both sides, PERRL, impaired upgaze otherwise EOMI, facial symmetric. Moves all extremities symmetrically 4/5 proximal and distally.DTR 1+ bilaterally and no babinski. Sensation symmetrical although limited testing due impaired cognition. Slow FTN but symmetrical. Gait not tested.  Discharge Diet  DIET DYS 3 Room service appropriate? Yes; Fluid consistency: Honey Thick  liquids  DISCHARGE PLAN  Disposition:  Transfer to [facility] for ongoing PT, OT and ST  aspirin 325 mg daily for secondary stroke prevention.  OP 30 Day monitor at discharge from CIR. Consider loop in the future, either here or in [place], based on final disposition.   Recommend ongoing risk factor control by Primary Care Physician at time of discharge from inpatient rehabilitation.  Follow-up No primary care provider on file. in 2 weeks following discharge from rehab.  Follow-up with Dr. Delia Heady, Stroke Clinic  in 6 weeks, office to schedule an appointment.   45 minutes were spent preparing discharge.  Rhoderick MoodyBIBY,SHARON  [facility] See Amion for Pager information 04/14/2016 1:43 PM   I have personally examined this patient, reviewed notes, independently viewed imaging studies, participated in medical decision making and plan of care.ROS completed by me personally and pertinent positives fully documented  I have made any additions or clarifications directly to the above note. Agree with note above.    Delia HeadyPramod Isamar Nazir, MD Medical Director [facility] Pager: [phone] 04/14/2016 2:21 PM

## 2016-04-14 NOTE — Progress Notes (Signed)
Physical Therapy Treatment Patient Details Name: Catherine Day MRN: [id number] DOB: 02/01/1928 Today's Date: 04/14/2016    History of Present Illness 82 year old female with PMH COPD, HTN, [facility] R sided-weakness and aphasia. MRI brain-- scattered acute infarcts in the left hemisphere primarily in the left temporal and occipital lobes, and Lt cerebellar artery territory. tPa was administered, taken to IR for revascularization of occluded Lt MCA, ACA and ICA terminus. She was electively intubated for the procedure; extubated 11/24.    PT Comments    Pt progressing towards physical therapy goals. Was able to increase ambulation distance slightly today but continues to be limited by DOE. Difficulty getting a reliable reading with the pulse-ox however with seated rest sats in mid 90's on 3L/min supplemental O2. Continue to feel that CIR would be an appropriate discharge disposition. Will continue to follow.   Follow Up Recommendations  CIR     Equipment Recommendations  Other (comment) (TBA)    Recommendations for Other Services Rehab consult;OT consult;Speech consult     Precautions / Restrictions Precautions Precautions: Fall Precaution Comments: on 2L 02 at baseline Restrictions Weight Bearing Restrictions: No    Mobility  Bed Mobility               General bed mobility comments: Pt sitting up in recliner with family present when PT arrived.   Transfers Overall transfer level: Needs assistance Equipment used: Rolling walker (2 wheeled) Transfers: Sit to/from Stand Sit to Stand: Min assist         General transfer comment: VC's for hand placement on seated surface for safety. Pt required assist for controlled descent to chair.   Ambulation/Gait Ambulation/Gait assistance: Min assist Ambulation Distance (Feet): 60 Feet Assistive device: Rolling walker (2 wheeled) Gait Pattern/deviations: Step-through pattern;Decreased stride  length;Trunk flexed;Trendelenburg Gait velocity: Decreased Gait velocity interpretation: Below normal speed for age/gender General Gait Details: VC's for improved posture and general safety with the RW. 1 seated rest break required due to fatigue and DOE.   Stairs            Wheelchair Mobility    Modified Rankin (Stroke Patients Only) Modified Rankin (Stroke Patients Only) Pre-Morbid Rankin Score: No symptoms Modified Rankin: Moderately severe disability     Balance Overall balance assessment: Needs assistance Sitting-balance support: Feet supported;No upper extremity supported Sitting balance-Leahy Scale: Fair     Standing balance support: No upper extremity supported;During functional activity Standing balance-Leahy Scale: Poor Standing balance comment: Requires UE support for safety and balance.                     Cognition Arousal/Alertness: Awake/alert Behavior During Therapy: WFL for tasks assessed/performed Overall Cognitive Status: Impaired/Different from baseline Area of Impairment: Safety/judgement;Memory Orientation Level: Time;Situation Current Attention Level: Sustained Memory: Decreased short-term memory   Safety/Judgement: Decreased awareness of safety;Decreased awareness of deficits   Problem Solving: Slow processing;Requires verbal cues      Exercises General Exercises - Lower Extremity Long Arc Quad: 10 reps Heel Slides: 10 reps;Strengthening Hip Flexion/Marching: 10 reps    General Comments        Pertinent Vitals/Pain Pain Assessment: No/denies pain    Home Living Family/patient expects to be discharged to:: Private residence Living Arrangements: Children                  Prior Function            PT Goals (current goals can now  be found in the care plan section) Acute Rehab PT Goals Patient Stated Goal[facility] PT Goal Formulation: With patient Time For Goal Achievement: 04/18/16 Potential to  Achieve Goals: Good Progress towards PT goals: Progressing toward goals    Frequency    Min 4X/week      PT Plan Current plan remains appropriate    Co-evaluation             End of Session Equipment Utilized During Treatment: Gait belt;Oxygen Activity Tolerance: Patient tolerated treatment well Patient left: in chair;with call bell/phone within reach;with chair alarm set;with family/visitor present     Time: [id number] PT Time Calculation (min) (ACUTE ONLY): 20 min  Charges:  $Gait Training: 8-22 mins                    G Codes:      Catherine Day 04/14/2016, 11:10 AM   Catherine Day, PT, DPT Acute Rehabilitation Services Pager: [phone]

## 2016-04-14 NOTE — H&P (Signed)
Physical Medicine and Rehabilitation Admission H&P    Chief Complaint  Patient presents with  . Altered Mental Status  . Code Stroke  : HPI: Catherine Day is a 82 y.o. right handed female with history of COPD. Per chart review patient lives in [place] with her daughter. Independent prior to admission. She had been visiting a son in [place]. Presented 04/10/2016 with sudden onset of right-sided weakness and inability to speak. CT/ MRI showed small volume of scattered acute infarct in the left hemisphere primarily in the left temporal and occipital lobes with petechial hemorrhage in the left temporal lobe. No associated mass effect. Small number of other scattered small infarcts in the bilateral MCA and left cerebellar artery territories. Patient did receive TPA. MRI showed possible occlusion of the left PCA P3 inferior division.. Echocardiogram with systolic function mildly reduced. Grade 1 diastolic dysfunction. Underwent bilateral common carotid arteriograms followed by complete revascularization of occluded left MCA, left ACA and left ICA terminus. Neurology follow-up currently maintained on aspirin for CVA prophylaxis.TEE completed 04/13/2016 showing no intracardiac source of embolism or shunt. MBS completed 04/12/2016 advised mechanical soft honey thick liquid diet.Subcutaneous Lovenox added for DVT prophylaxis 04/14/2016. Physical and occupational therapy evaluations completed with recommendations of physical medicine rehabilitation consult.Patient was admitted for a comprehensive rehabilitation program.  Review of Systems  Constitutional: Negative for chills and fever.  HENT: Negative for hearing loss and tinnitus.   Eyes: Negative for blurred vision and double vision.  Respiratory: Positive for shortness of breath.        Intermittent cough  Cardiovascular: Positive for leg swelling. Negative for chest pain and palpitations.  Gastrointestinal: Positive for constipation. Negative  for nausea and vomiting.  Genitourinary: Negative for dysuria, flank pain and hematuria.  Musculoskeletal: Positive for joint pain and myalgias.  Skin: Negative for rash.  Neurological: Positive for speech change and weakness. Negative for seizures and loss of consciousness.  Psychiatric/Behavioral: Positive for memory loss.  All other systems reviewed and are negative.  Past Medical History:  Diagnosis Date  . COPD (chronic obstructive pulmonary disease) ([place])    Past Surgical History:  Procedure Laterality Date  . IR GENERIC HISTORICAL  04/09/2016   IR ANGIO INTRA EXTRACRAN SEL COM CAROTID INNOMINATE UNI R MOD SED 04/09/2016 Luanne Bras, MD [facility]  . IR GENERIC HISTORICAL  04/09/2016   IR PERCUTANEOUS ART THROMBECTOMY/INFUSION INTRACRANIAL INC DIAG ANGIO 04/09/2016 Luanne Bras, MD [facility]  . PELVIC FRACTURE SURGERY    . RADIOLOGY WITH ANESTHESIA N/A 04/09/2016   Procedure: RADIOLOGY WITH ANESTHESIA;  Surgeon: Luanne Bras, MD;  Location: [facility];  Service: Radiology;  Laterality: N/A;  . TEE WITHOUT CARDIOVERSION N/A 04/13/2016   Procedure: TRANSESOPHAGEAL ECHOCARDIOGRAM (TEE);  Surgeon: Sanda Klein, MD;  Location[facility] ENDOSCOPY;  Service: Cardiovascular;  Laterality: N/A;   History reviewed. No pertinent family history. Social History:  reports that she has never smoked. She has never used smokeless tobacco. She reports that she does not drink alcohol or use drugs. Allergies: No Known Allergies Medications Prior to Admission  Medication Sig Dispense Refill  . Calcium Citrate-Vitamin D (CITRACAL + D PO) Take 1 tablet by mouth daily.    Marland Kitchen denosumab (PROLIA) 60 MG/ML SOLN injection Inject 60 mg into the skin every 6 (six) months. Administer in upper arm, thigh, or abdomen Last injection 03/24/16    . fluticasone furoate-vilanterol (BREO ELLIPTA) 100-25 MCG/INH AEPB Inhale 1 puff into the lungs daily.    Marland Kitchen ipratropium-albuterol (DUONEB) 0.5-2.5 (3)  MG/3ML  SOLN Take 3 mLs by nebulization See admin instructions. Inhale 1 vial by nebulization every morning and may also use 2 more times during the day as needed for shortness of breath or wheezing    . levalbuterol (XOPENEX HFA) 45 MCG/ACT inhaler Inhale 1-2 puffs into the lungs every 4 (four) hours as needed for wheezing or shortness of breath.    . methylPREDNISolone (MEDROL DOSEPAK) 4 MG TBPK tablet Take 4-8 mg by mouth See admin instructions. Tapered course started 04/09/16 am -Day 1: take 2 tablets (8 mg) by mouth with breakfast, 1 tablet (4 mg) with lunch and supper and 2 tablets at bedtime; Day 2: take 1 tablet with breakfast, lunch and supper and 2 tablets at bedtime; Day 3: take 1 tablet with breakfast, lunch, supper and at bedtime; Day 4: take 1 tablet with breakfast, lunch and at bedtime; Day 5: take 1 tablet with breakfast and at bedtime; Day 6: take 1 tablet with breakfast  0  . OXYGEN Inhale 2 L into the lungs continuous.    Marland Kitchen tiotropium (SPIRIVA) 18 MCG inhalation capsule Place 18 mcg into inhaler and inhale daily.    Marland Kitchen tobramycin, PF, (TOBI) 300 MG/5ML nebulizer solution Take 300 mg by nebulization daily.    . valsartan-hydrochlorothiazide (DIOVAN-HCT) 80-12.5 MG tablet Take 1 tablet by mouth daily.      Home: Home Living Family/patient expects to be discharged to:: Private residence Living Arrangements: Children (lives with her daughter in [place]) Available Help at Discharge: Family, Available PRN/intermittently Home Equipment: Gilford Rile - 4 wheels (oxygen) Additional Comments: pt reports she lives in [place]; visiting her son  Lives With: Daughter (in [place])   Functional History: Prior Function Level of Independence: Independent Comments: per pt  Functional Status:  Mobility: Bed Mobility Overal bed mobility: Needs Assistance Bed Mobility: Supine to Sit Supine to sit: Supervision, HOB elevated General bed mobility comments: Pt sitting up in recliner with family present when PT  arrived.  Transfers Overall transfer level: Needs assistance Equipment used: Rolling walker (2 wheeled) Transfers: Sit to/from Stand Sit to Stand: Min assist Stand pivot transfers: Min guard General transfer comment: VC's for hand placement on seated surface for safety. Pt required assist for controlled descent to chair.  Ambulation/Gait Ambulation/Gait assistance: Min assist Ambulation Distance (Feet): 50 Feet Assistive device: Rolling walker (2 wheeled) Gait Pattern/deviations: Step-through pattern, Decreased stride length, Trendelenburg, Trunk flexed General Gait Details: VC's for improved posture and general safety with the RW. Pt required occasional min assist for balance support and walker maneuvering.  Gait velocity: Decreased Gait velocity interpretation: Below normal speed for age/gender    ADL: ADL Overall ADL's : Needs assistance/impaired Eating/Feeding: Independent, Sitting Grooming: Brushing hair, Sitting, Set up Upper Body Bathing: Minimal assitance, Sitting Lower Body Bathing: Minimal assistance, Sit to/from stand Upper Body Dressing : Minimal assistance, Sitting Upper Body Dressing Details (indicate cue type and reason): front opening gown Lower Body Dressing: Minimal assistance, Sit to/from stand Lower Body Dressing Details (indicate cue type and reason): pt able to cross foot over opposite knee to don and doff socks Toilet Transfer: Min guard, Ambulation, RW Toilet Transfer Details (indicate cue type and reason): simulated to chair Toileting- Clothing Manipulation and Hygiene: Supervision/safety, Sitting/lateral lean Functional mobility during ADLs: Minimal assistance, Rolling walker General ADL Comments: Per daughter, pt required a lot of encouragement to shower, but once in the shower, performed by herself.   Cognition: Cognition Overall Cognitive Status: Impaired/Different from baseline Arousal/Alertness: Awake/alert Orientation Level: Oriented to  person,  Disoriented to situation, Disoriented to place, Oriented to time Attention: Sustained Sustained Attention: Appears intact Memory: Impaired Memory Impairment: Storage deficit, Retrieval deficit Awareness: Impaired Awareness Impairment: Emergent impairment, Intellectual impairment, Anticipatory impairment Problem Solving: Impaired Problem Solving Impairment: Verbal basic Safety/Judgment: Impaired Cognition Arousal/Alertness: Awake/alert Behavior During Therapy: WFL for tasks assessed/performed Overall Cognitive Status: Impaired/Different from baseline Area of Impairment: Safety/judgement, Memory Orientation Level: Time, Situation Current Attention Level: Sustained Memory: Decreased short-term memory Safety/Judgement: Decreased awareness of safety, Decreased awareness of deficits Awareness:  (pre-intellectual) Problem Solving: Slow processing, Requires verbal cues General Comments: family in room and reports she is not near her baseline.  Physical Exam: Blood pressure 115/62, pulse 88, temperature 97.8 F (36.6 C), temperature source Oral, resp. rate 20, height _0  (1.676 m), weight 77.4 kg (170 lb 10.2 oz), SpO2 100 %. Physical Exam  Vitals reviewed. Constitutional: She is oriented to person, place, and time.  HENT:  Head: Normocephalic.  Eyes: Conjunctivae and EOM are normal. Scleral icterus is present.  Neck: Normal range of motion. Neck supple. No JVD present. No tracheal deviation present. No thyromegaly present.  Cardiovascular: Normal rate, regular rhythm and normal heart sounds.  Exam reveals no friction rub.   No murmur heard. Respiratory: Effort normal. No respiratory distress. She has no wheezes. She has no rales.  Scattered rhonchi and upper airway sounds.   GI: Soft. Bowel sounds are normal. She exhibits no distension. There is no tenderness. There is no guarding.  Musculoskeletal: She exhibits no edema or deformity.  Neurological: She is alert and oriented to  person, place, and time.  Followed simple commands. Speech slightly dysarthric but intelligible. Fair insight and awareness. Language appears functional. RUE 4/5 deltoid, bicep, tricep, HI, RLE: 3+HF, 4/5 KE and 4/5ADF/PF. LUE and LLE grossly 5/5. Senses pain and LT in both right and left limbs.       Skin: Skin is warm and dry.  Psychiatric: She has a normal mood and affect. Her behavior is normal  Results for orders placed or performed during the hospital encounter of 04/09/16 (from the past 48 hour(s))  Iron and TIBC     Status: Abnormal   Collection Time: 04/12/16  3:00 PM  Result Value Ref Range   Iron 24 (L) 28 - 170 ug/dL   TIBC 228 (L) 250 - 450 ug/dL   Saturation Ratios 11 10.4 - 31.8 %   UIBC 204 ug/dL  CBC     Status: Abnormal   Collection Time: 04/13/16  4:32 AM  Result Value Ref Range   WBC 11.1 (H) 4.0 - 10.5 K/uL   RBC 3.69 (L) 3.87 - 5.11 MIL/uL   Hemoglobin 9.9 (L) 12.0 - 15.0 g/dL   HCT 31.2 (L) 36.0 - 46.0 %   MCV 84.6 78.0 - 100.0 fL   MCH 26.8 26.0 - 34.0 pg   MCHC 31.7 30.0 - 36.0 g/dL   RDW 14.1 11.5 - 15.5 %   Platelets 173 150 - 400 K/uL  Basic metabolic panel     Status: Abnormal   Collection Time: 04/13/16  4:32 AM  Result Value Ref Range   Sodium 136 135 - 145 mmol/L   Potassium 3.2 (L) 3.5 - 5.1 mmol/L   Chloride 108 101 - 111 mmol/L   CO2 23 22 - 32 mmol/L   Glucose, Bld 114 (H) 65 - 99 mg/dL   BUN 11 6 - 20 mg/dL   Creatinine, Ser 0.67 0.44 - 1.00 mg/dL   Calcium 6.9 (  L) 8.9 - 10.3 mg/dL   GFR calc non Af Amer >60 >60 mL/min   GFR calc Af Amer >60 >60 mL/min    Comment: (NOTE) The eGFR has been calculated using the CKD EPI equation. This calculation has not been validated in all clinical situations. eGFR's persistently <60 mL/min signify possible Chronic Kidney Disease.    Anion gap 5 5 - 15  Basic metabolic panel     Status: Abnormal   Collection Time: 04/14/16  5:30 AM  Result Value Ref Range   Sodium 139 135 - 145 mmol/L   Potassium  3.2 (L) 3.5 - 5.1 mmol/L   Chloride 110 101 - 111 mmol/L   CO2 23 22 - 32 mmol/L   Glucose, Bld 98 65 - 99 mg/dL   BUN 5 (L) 6 - 20 mg/dL   Creatinine, Ser 0.50 0.44 - 1.00 mg/dL   Calcium 6.9 (L) 8.9 - 10.3 mg/dL   GFR calc non Af Amer >60 >60 mL/min   GFR calc Af Amer >60 >60 mL/min    Comment: (NOTE) The eGFR has been calculated using the CKD EPI equation. This calculation has not been validated in all clinical situations. eGFR's persistently <60 mL/min signify possible Chronic Kidney Disease.    Anion gap 6 5 - 15   Dg Swallowing Func-speech Pathology  Result Date: 04/12/2016 Objective Swallowing Evaluation: Type of Study: MBS-Modified Barium Swallow Study Patient Details Name: Danayah Smyre MRN: [id number] Date of Birth: 11/13/1927 Today's Date: 04/12/2016 Time: SLP Start Time (ACUTE ONLY): 1116-SLP Stop Time (ACUTE ONLY): 1131 SLP Time Calculation (min) (ACUTE ONLY): 15 min Past Medical History: Past Medical History: Diagnosis Date . COPD (chronic obstructive pulmonary disease) ([place])  Past Surgical History: Past Surgical History: Procedure Laterality Date . PELVIC FRACTURE SURGERY   . RADIOLOGY WITH ANESTHESIA N/A 04/09/2016  Procedure: RADIOLOGY WITH ANESTHESIA;  Surgeon: Luanne Bras, MD;  Location: [facility];  Service: Radiology;  Laterality: N/A; HPI: Ms Westergard is an 82 yo female with h/o COPD admitted with mental status change, right sided weakness, hemianopsia, left gaze preference,  Pt required intubation and was extubated yesterday.  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  Swallow and speech eval ordered Subjective: pt awake, pleasant Assessment / Plan / Recommendation CHL IP CLINICAL IMPRESSIONS 04/12/2016 Therapy Diagnosis Moderate pharyngeal phase dysphagia Clinical Impression Pt has a moderate pharyngeal dysphagia due in large part to delayed swallow initiation and decreased sensation of  airway penetration. Liquids spill to the pyriform sinuses, resulting in deep, silent penetration of thin and nectar thick liquids. Although this does not occur with every bolus, it is silent and unable to be completely cleared with cued coughing, making her at a higher risk for an aspiration-related infection. Her strength is adequate with no significant residuals remaining post-swallow. Recommend Dys 3 diet and honey thick liquids. SLP to f/u for tolerance and readiness to advance. Impact on safety and function Moderate aspiration risk   CHL IP TREATMENT RECOMMENDATION 04/12/2016 Treatment Recommendations Therapy as outlined in treatment plan below   Prognosis 04/12/2016 Prognosis for Safe Diet Advancement Good Barriers to Reach Goals -- Barriers/Prognosis Comment -- CHL IP DIET RECOMMENDATION 04/12/2016 SLP Diet Recommendations Dysphagia 3 (Mech soft) solids;Honey thick liquids Liquid Administration via Cup;No straw Medication Administration Whole meds with puree Compensations Slow rate;Small sips/bites Postural Changes Remain semi-upright after after feeds/meals (Comment);Seated upright at 90 degrees   CHL IP OTHER RECOMMENDATIONS 04/12/2016 Recommended  Consults -- Oral Care Recommendations Oral care BID Other Recommendations Order thickener from pharmacy;Prohibited food (jello, ice cream, thin soups);Remove water pitcher   CHL IP FOLLOW UP RECOMMENDATIONS 04/12/2016 Follow up Recommendations Inpatient Rehab   CHL IP FREQUENCY AND DURATION 04/12/2016 Speech Therapy Frequency (ACUTE ONLY) min 2x/week Treatment Duration 2 weeks      CHL IP ORAL PHASE 04/12/2016 Oral Phase WFL Oral - Pudding Teaspoon -- Oral - Pudding Cup -- Oral - Honey Teaspoon -- Oral - Honey Cup -- Oral - Nectar Teaspoon -- Oral - Nectar Cup -- Oral - Nectar Straw -- Oral - Thin Teaspoon -- Oral - Thin Cup -- Oral - Thin Straw -- Oral - Puree -- Oral - Mech Soft -- Oral - Regular -- Oral - Multi-Consistency -- Oral - Pill -- Oral Phase - Comment  --  CHL IP PHARYNGEAL PHASE 04/12/2016 Pharyngeal Phase Impaired Pharyngeal- Pudding Teaspoon -- Pharyngeal -- Pharyngeal- Pudding Cup -- Pharyngeal -- Pharyngeal- Honey Teaspoon -- Pharyngeal -- Pharyngeal- Honey Cup Delayed swallow initiation-pyriform sinuses Pharyngeal -- Pharyngeal- Nectar Teaspoon -- Pharyngeal -- Pharyngeal- Nectar Cup Delayed swallow initiation-pyriform sinuses;Penetration/Aspiration before swallow Pharyngeal Material enters airway, remains ABOVE vocal cords and not ejected out Pharyngeal- Nectar Straw -- Pharyngeal -- Pharyngeal- Thin Teaspoon -- Pharyngeal -- Pharyngeal- Thin Cup Delayed swallow initiation-pyriform sinuses;Penetration/Aspiration before swallow Pharyngeal Material enters airway, CONTACTS cords and not ejected out Pharyngeal- Thin Straw -- Pharyngeal -- Pharyngeal- Puree Delayed swallow initiation-vallecula Pharyngeal -- Pharyngeal- Mechanical Soft Delayed swallow initiation-vallecula Pharyngeal -- Pharyngeal- Regular -- Pharyngeal -- Pharyngeal- Multi-consistency -- Pharyngeal -- Pharyngeal- Pill -- Pharyngeal -- Pharyngeal Comment --  CHL IP CERVICAL ESOPHAGEAL PHASE 04/12/2016 Cervical Esophageal Phase WFL Pudding Teaspoon -- Pudding Cup -- Honey Teaspoon -- Honey Cup -- Nectar Teaspoon -- Nectar Cup -- Nectar Straw -- Thin Teaspoon -- Thin Cup -- Thin Straw -- Puree -- Mechanical Soft -- Regular -- Multi-consistency -- Pill -- Cervical Esophageal Comment -- No flowsheet data found. Germain Osgood 04/12/2016, 1:05 PM  Germain Osgood, M.A. CCC-SLP [phone]                 Medical Problem List and Plan: 1.  Right hemiparesthesias/dysphagia with cognitive deficits secondary to acute infarct left hemisphere in the left temporal occipital lobes, bilateral MCA and left cerebellar artery territories  -admit to inpatient rehab 2.  DVT Prophylaxis/Anticoagulation: Subcutaneous Lovenox for DVT prophylaxis 3. Pain Management: Tylenol as needed 4. Dysphagia.  Dysphagia #3 Honey thick liquids. Follow-up speech therapy  -aspiration precautions 5. Neuropsych: This patient is capable of making decisions on her own behalf. 6. Skin/Wound Care: Routine skin check 7. Fluids/Electrolytes/Nutrition: Routine I&O with follow-up chemistries upon admit 8. COPD. Continue nebulizer treatments--TID  -utilize flutter valve and IS. 9. Hyperlipidemia. Lipitor 10. Hypokalemia. Follow-up chemistries upon admit   Post Admission Physician Evaluation: 1. Functional deficits secondary  to embolic bi-cerebral infarcts. 2. Patient is admitted to receive collaborative, interdisciplinary care between the physiatrist, rehab nursing staff, and therapy team. 3. Patient's level of medical complexity and substantial therapy needs in context of that medical necessity cannot be provided at a lesser intensity of care such as a SNF. 4. Patient has experienced substantial functional loss from his/her baseline which was documented above under the "Functional History" and "Functional Status" headings.  Judging by the patient's diagnosis, physical exam, and functional history, the patient has potential for functional progress which will result in measurable gains while on inpatient rehab.  These gains will be of substantial and practical use upon  discharge  in facilitating mobility and self-care at the household level. 5. Physiatrist will provide 24 hour management of medical needs as well as oversight of the therapy plan/treatment and provide guidance as appropriate regarding the interaction of the two. 6. The Preadmission Screening has been reviewed and patient status is unchanged unless otherwise stated above. 7. 24 hour rehab nursing will assist with bladder management, bowel management, safety, skin/wound care, disease management, medication administration, pain management and patient education  and help integrate therapy concepts, techniques,education, etc. 8. PT will assess and treat  for/with: Lower extremity strength, range of motion, stamina, balance, functional mobility, safety, adaptive techniques and equipment, NMR, community reintegration, visual-spatial awareness, cognitive behavioral mgt.   Goals are: mod I to supervision. 9. OT will assess and treat for/with: ADL's, functional mobility, safety, upper extremity strength, adaptive techniques and equipment, NMR, cognitive-perceptual awareness, family education.   Goals are: mod I to supervision. Therapy may proceed with showering this patient. 10. SLP will assess and treat for/with: cognition, communication, swallowing.  Goals are: mod I to supervision. 11. Case Management and Social Worker will assess and treat for psychological issues and discharge planning. 12. Team conference will be held weekly to assess progress toward goals and to determine barriers to discharge. 13. Patient will receive at least 3 hours of therapy per day at least 5 days per week. 14. ELOS: 7-10 days       15. Prognosis:  excellent     Meredith Staggers, MD, [facility] 04/14/2016  04/14/2016

## 2016-04-15 ENCOUNTER — Encounter (HOSPITAL_COMMUNITY): Payer: Self-pay

## 2016-04-15 ENCOUNTER — Inpatient Hospital Stay (HOSPITAL_COMMUNITY): Payer: Medicare Other | Admitting: Speech Pathology

## 2016-04-15 ENCOUNTER — Inpatient Hospital Stay (HOSPITAL_COMMUNITY): Payer: Medicare Other | Admitting: Occupational Therapy

## 2016-04-15 ENCOUNTER — Inpatient Hospital Stay (HOSPITAL_COMMUNITY): Payer: Medicare Other | Admitting: Physical Therapy

## 2016-04-15 ENCOUNTER — Inpatient Hospital Stay (HOSPITAL_COMMUNITY)
Admission: RE | Admit: 2016-04-15 | Discharge: 2016-04-27 | DRG: 177 | Disposition: A | Discharge status: Skilled Nursing Facility | Payer: Medicare Other | Source: Ambulatory Visit | Attending: Internal Medicine | Admitting: Internal Medicine

## 2016-04-15 DIAGNOSIS — Z79899 Other long term (current) drug therapy: Secondary | ICD-10-CM | POA: Diagnosis not present

## 2016-04-15 DIAGNOSIS — J441 Chronic obstructive pulmonary disease with (acute) exacerbation: Secondary | ICD-10-CM | POA: Diagnosis present

## 2016-04-15 DIAGNOSIS — J9601 Acute respiratory failure with hypoxia: Secondary | ICD-10-CM

## 2016-04-15 DIAGNOSIS — Z7982 Long term (current) use of aspirin: Secondary | ICD-10-CM | POA: Diagnosis not present

## 2016-04-15 DIAGNOSIS — R0603 Acute respiratory distress: Secondary | ICD-10-CM

## 2016-04-15 DIAGNOSIS — E785 Hyperlipidemia, unspecified: Secondary | ICD-10-CM | POA: Diagnosis present

## 2016-04-15 DIAGNOSIS — B9620 Unspecified Escherichia coli [E. coli] as the cause of diseases classified elsewhere: Secondary | ICD-10-CM | POA: Diagnosis present

## 2016-04-15 DIAGNOSIS — Z9981 Dependence on supplemental oxygen: Secondary | ICD-10-CM

## 2016-04-15 DIAGNOSIS — Z66 Do not resuscitate: Secondary | ICD-10-CM | POA: Diagnosis not present

## 2016-04-15 DIAGNOSIS — B9629 Other Escherichia coli [E. coli] as the cause of diseases classified elsewhere: Secondary | ICD-10-CM

## 2016-04-15 DIAGNOSIS — J690 Pneumonitis due to inhalation of food and vomit: Secondary | ICD-10-CM | POA: Diagnosis present

## 2016-04-15 DIAGNOSIS — J9621 Acute and chronic respiratory failure with hypoxia: Secondary | ICD-10-CM | POA: Diagnosis present

## 2016-04-15 DIAGNOSIS — I69391 Dysphagia following cerebral infarction: Secondary | ICD-10-CM | POA: Diagnosis not present

## 2016-04-15 DIAGNOSIS — Z1612 Extended spectrum beta lactamase (ESBL) resistance: Secondary | ICD-10-CM | POA: Diagnosis present

## 2016-04-15 DIAGNOSIS — I5042 Chronic combined systolic (congestive) and diastolic (congestive) heart failure: Secondary | ICD-10-CM

## 2016-04-15 DIAGNOSIS — H9190 Unspecified hearing loss, unspecified ear: Secondary | ICD-10-CM

## 2016-04-15 DIAGNOSIS — G92 Toxic encephalopathy: Secondary | ICD-10-CM | POA: Diagnosis present

## 2016-04-15 DIAGNOSIS — E876 Hypokalemia: Secondary | ICD-10-CM | POA: Diagnosis present

## 2016-04-15 DIAGNOSIS — J9801 Acute bronchospasm: Secondary | ICD-10-CM | POA: Diagnosis present

## 2016-04-15 DIAGNOSIS — I959 Hypotension, unspecified: Secondary | ICD-10-CM | POA: Diagnosis present

## 2016-04-15 DIAGNOSIS — R1310 Dysphagia, unspecified: Secondary | ICD-10-CM | POA: Diagnosis present

## 2016-04-15 DIAGNOSIS — I69351 Hemiplegia and hemiparesis following cerebral infarction affecting right dominant side: Secondary | ICD-10-CM | POA: Diagnosis not present

## 2016-04-15 DIAGNOSIS — I5022 Chronic systolic (congestive) heart failure: Secondary | ICD-10-CM | POA: Diagnosis not present

## 2016-04-15 DIAGNOSIS — R609 Edema, unspecified: Secondary | ICD-10-CM | POA: Diagnosis not present

## 2016-04-15 DIAGNOSIS — I63013 Cerebral infarction due to thrombosis of bilateral vertebral arteries: Secondary | ICD-10-CM

## 2016-04-15 DIAGNOSIS — J189 Pneumonia, unspecified organism: Secondary | ICD-10-CM

## 2016-04-15 DIAGNOSIS — R531 Weakness: Secondary | ICD-10-CM | POA: Diagnosis present

## 2016-04-15 DIAGNOSIS — G9340 Encephalopathy, unspecified: Secondary | ICD-10-CM | POA: Diagnosis not present

## 2016-04-15 DIAGNOSIS — I69319 Unspecified symptoms and signs involving cognitive functions following cerebral infarction: Secondary | ICD-10-CM | POA: Diagnosis not present

## 2016-04-15 DIAGNOSIS — A498 Other bacterial infections of unspecified site: Secondary | ICD-10-CM | POA: Diagnosis not present

## 2016-04-15 DIAGNOSIS — R062 Wheezing: Secondary | ICD-10-CM

## 2016-04-15 DIAGNOSIS — M7989 Other specified soft tissue disorders: Secondary | ICD-10-CM | POA: Diagnosis not present

## 2016-04-15 DIAGNOSIS — N390 Urinary tract infection, site not specified: Secondary | ICD-10-CM | POA: Diagnosis present

## 2016-04-15 DIAGNOSIS — I5043 Acute on chronic combined systolic (congestive) and diastolic (congestive) heart failure: Secondary | ICD-10-CM

## 2016-04-15 DIAGNOSIS — B59 Pneumocystosis: Secondary | ICD-10-CM

## 2016-04-15 DIAGNOSIS — I69951 Hemiplegia and hemiparesis following unspecified cerebrovascular disease affecting right dominant side: Secondary | ICD-10-CM | POA: Diagnosis not present

## 2016-04-15 LAB — URINALYSIS, ROUTINE W REFLEX MICROSCOPIC
BILIRUBIN URINE: NEGATIVE
Glucose, UA: NEGATIVE mg/dL
Ketones, ur: NEGATIVE mg/dL
Nitrite: POSITIVE — AB
PROTEIN: NEGATIVE mg/dL
SPECIFIC GRAVITY, URINE: 1.018 (ref 1.005–1.030)
pH: 5.5 (ref 5.0–8.0)

## 2016-04-15 LAB — BASIC METABOLIC PANEL
Anion gap: 10 (ref 5–15)
BUN: 11 mg/dL (ref 6–20)
CHLORIDE: 109 mmol/L (ref 101–111)
CO2: 20 mmol/L — AB (ref 22–32)
Calcium: 7.1 mg/dL — ABNORMAL LOW (ref 8.9–10.3)
Creatinine, Ser: 0.72 mg/dL (ref 0.44–1.00)
GFR calc Af Amer: >60 mL/min (ref >60)
GLUCOSE: 141 mg/dL — AB (ref 65–99)
POTASSIUM: 3.3 mmol/L — AB (ref 3.5–5.1)
Sodium: 139 mmol/L (ref 135–145)

## 2016-04-15 LAB — URINE MICROSCOPIC-ADD ON

## 2016-04-15 LAB — CBC
HEMATOCRIT: 32 % — AB (ref 36.0–46.0)
Hemoglobin: 10.4 g/dL — ABNORMAL LOW (ref 12.0–15.0)
MCH: 27.2 pg (ref 26.0–34.0)
MCHC: 32.5 g/dL (ref 30.0–36.0)
MCV: 83.8 fL (ref 78.0–100.0)
Platelets: 187 10*3/uL (ref 150–400)
RBC: 3.82 MIL/uL — ABNORMAL LOW (ref 3.87–5.11)
RDW: 14.1 % (ref 11.5–15.5)
WBC: 12.3 10*3/uL — ABNORMAL HIGH (ref 4.0–10.5)

## 2016-04-15 LAB — PROCALCITONIN: PROCALCITONIN: 0.13 ng/mL

## 2016-04-15 LAB — MAGNESIUM: Magnesium: 1.7 mg/dL (ref 1.7–2.4)

## 2016-04-15 LAB — PHOSPHORUS: Phosphorus: 1.3 mg/dL — ABNORMAL LOW (ref 2.5–4.6)

## 2016-04-15 MED ORDER — IPRATROPIUM-ALBUTEROL 0.5-2.5 (3) MG/3ML IN SOLN
3.0000 mL | Freq: Four times a day (QID) | RESPIRATORY_TRACT | Status: DC
  Administered 2016-04-15 – 2016-04-20 (×22): 3 mL via RESPIRATORY_TRACT
  Filled 2016-04-15 (×22): qty 3

## 2016-04-15 MED ORDER — FAMOTIDINE IN NACL 20-0.9 MG/50ML-% IV SOLN
20.0000 mg | Freq: Two times a day (BID) | INTRAVENOUS | Status: DC
  Administered 2016-04-15 – 2016-04-18 (×9): 20 mg via INTRAVENOUS
  Filled 2016-04-15 (×9): qty 50

## 2016-04-15 MED ORDER — SODIUM CHLORIDE 0.9 % IV BOLUS (SEPSIS)
500.0000 mL | Freq: Once | INTRAVENOUS | Status: AC
  Administered 2016-04-15: 500 mL via INTRAVENOUS

## 2016-04-15 MED ORDER — METHYLPREDNISOLONE SODIUM SUCC 40 MG IJ SOLR
40.0000 mg | Freq: Every day | INTRAMUSCULAR | Status: DC
Start: 2016-04-16 — End: 2016-04-20
  Administered 2016-04-16 – 2016-04-20 (×5): 40 mg via INTRAVENOUS
  Filled 2016-04-15 (×5): qty 1

## 2016-04-15 MED ORDER — VALSARTAN-HYDROCHLOROTHIAZIDE 80-12.5 MG PO TABS: 1.0000 | ORAL_TABLET | Freq: Every day | ORAL | Status: DC

## 2016-04-15 MED ORDER — POTASSIUM PHOSPHATES 15 MMOLE/5ML IV SOLN
30.0000 mmol | Freq: Once | INTRAVENOUS | Status: AC
  Administered 2016-04-15: 30 mmol via INTRAVENOUS
  Filled 2016-04-15: qty 10

## 2016-04-15 MED ORDER — ALBUTEROL SULFATE (2.5 MG/3ML) 0.083% IN NEBU
2.5000 mg | INHALATION_SOLUTION | RESPIRATORY_TRACT | Status: DC | PRN
  Administered 2016-04-21 – 2016-04-23 (×3): 2.5 mg via RESPIRATORY_TRACT
  Filled 2016-04-15 (×3): qty 3

## 2016-04-15 MED ORDER — SODIUM CHLORIDE 0.9 % IV SOLN
INTRAVENOUS | Status: DC
  Administered 2016-04-15: 01:00:00 via INTRAVENOUS

## 2016-04-15 MED ORDER — HEPARIN SODIUM (PORCINE) 5000 UNIT/ML IJ SOLN
5000.0000 [IU] | Freq: Three times a day (TID) | INTRAMUSCULAR | Status: DC
  Administered 2016-04-15 – 2016-04-27 (×39): 5000 [IU] via SUBCUTANEOUS
  Filled 2016-04-15 (×38): qty 1

## 2016-04-15 MED ORDER — PIPERACILLIN-TAZOBACTAM 3.375 G IVPB
3.3750 g | Freq: Three times a day (TID) | INTRAVENOUS | Status: DC
  Administered 2016-04-15 – 2016-04-18 (×10): 3.375 g via INTRAVENOUS
  Filled 2016-04-15 (×13): qty 50

## 2016-04-15 MED ORDER — VANCOMYCIN HCL 10 G IV SOLR
1500.0000 mg | Freq: Once | INTRAVENOUS | Status: AC
  Administered 2016-04-15: 1500 mg via INTRAVENOUS
  Filled 2016-04-15: qty 1500

## 2016-04-15 MED ORDER — ASPIRIN 325 MG PO TABS
325.0000 mg | ORAL_TABLET | Freq: Every day | ORAL | Status: DC
  Administered 2016-04-17 – 2016-04-22 (×6): 325 mg via ORAL
  Filled 2016-04-15 (×7): qty 1

## 2016-04-15 MED ORDER — IRBESARTAN 75 MG PO TABS
75.0000 mg | ORAL_TABLET | Freq: Every day | ORAL | Status: DC
  Administered 2016-04-17 – 2016-04-22 (×6): 75 mg via ORAL
  Filled 2016-04-15 (×7): qty 1

## 2016-04-15 MED ORDER — SODIUM CHLORIDE 0.9 % IV SOLN: 250.0000 mL | INTRAVENOUS | Status: DC | PRN

## 2016-04-15 MED ORDER — HYDROCHLOROTHIAZIDE 12.5 MG PO CAPS
12.5000 mg | ORAL_CAPSULE | Freq: Every day | ORAL | Status: DC
  Administered 2016-04-17 – 2016-04-22 (×6): 12.5 mg via ORAL
  Filled 2016-04-15 (×7): qty 1

## 2016-04-15 MED ORDER — METHYLPREDNISOLONE SODIUM SUCC 40 MG IJ SOLR
40.0000 mg | Freq: Two times a day (BID) | INTRAMUSCULAR | Status: DC
  Administered 2016-04-15 (×2): 40 mg via INTRAVENOUS
  Filled 2016-04-15 (×2): qty 1

## 2016-04-15 MED ORDER — VANCOMYCIN HCL IN DEXTROSE 750-5 MG/150ML-% IV SOLN
750.0000 mg | INTRAVENOUS | Status: DC
  Administered 2016-04-16 – 2016-04-18 (×3): 750 mg via INTRAVENOUS
  Filled 2016-04-15 (×4): qty 150

## 2016-04-15 MED ORDER — ORAL CARE MOUTH RINSE
15.0000 mL | Freq: Two times a day (BID) | OROMUCOSAL | Status: DC
  Administered 2016-04-15 – 2016-04-27 (×24): 15 mL via OROMUCOSAL

## 2016-04-15 NOTE — Progress Notes (Signed)
Pt arrived to unit from rehab floor. Report received from Anissa.

## 2016-04-15 NOTE — Discharge Summary (Signed)
NAMHughie Closs:  Huante, Maizy                 ACCOUNT NO.:  [id number]  MEDICAL RECORD NO.:  [id number]  LOCATION:  [id number]                       FACILITY:  [facility]  PHYSICIAN:  Erick ColaceAndrew E. Kirsteins, M.D.DATE OF BIRTH:  [birth year]  DATE OF ADMISSION:  04/14/2016 DATE OF DISCHARGE:  04/15/2016                              DISCHARGE SUMMARY   DISCHARGE DIAGNOSES: 1. 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 2. Subcutaneous Lovenox for deep vein thrombosis prophylaxis. 3. Aspiration pneumonia. 4. Dysphagia. 5. Chronic obstructive pulmonary disease. 6. Hyperlipidemia. 7. Hypokalemia.  HISTORY OF PRESENT ILLNESS:  This is an 82 year old right-handed female with history of COPD.  She lives in [place] with her daughter, independent prior to admission as she was visiting family in [place].  Presented on April 10, 2016, with sudden onset of right-sided weakness, inability to speak.  CT and MRI showed small volume of scattered acute infarct in the left hemisphere primarily in the left temporal and occipital lobes.  No associated mass effect.  Small number of other scattered small bilateral MCA left cerebellar artery territory infarcts. The patient did receive tPA.  MRI showed possible occlusion of the left PCA-P3 inferior division.  Echocardiogram with ejection fraction, reduced to grade 1 diastolic dysfunction.  Underwent bilateral common carotid arteriograms followed by complete revascularization of occluded left MCA, left ACA, left ICA terminus.  Neurology consulted, maintained on aspirin therapy for CVA prophylaxis.  TEE completed showing no intracardiac source of embolism or shunt.  Modified barium swallow, on a mechanical soft, honey-thick liquid diet.  Subcutaneous Lovenox for DVT prophylaxis.  Physical and occupational therapy ongoing.  The patient was admitted for a comprehensive rehab program.  PAST MEDICAL HISTORY:  See  discharge diagnoses.  SOCIAL HISTORY:  Lives with family in [place].  She has a son in [place].  Reported to be independent prior to admission.  Functional status upon admission to rehab services minimal assist 50 feet, rolling walker; minimal guard, stand pivot transfers; min to mod assist for activities of daily living.  PHYSICAL EXAMINATION:  VITAL SIGNS:  Blood pressure 115/62, pulse 88, temperature 97, respirations 20. GENERAL:  This was an alert female.  Oriented to person, place, and time.  Followed simple commands.  Speech slightly dysarthric, but intelligible.  Fair insight and awareness of deficits. LUNGS:  Decreased breath sounds, scattered rhonchi, upper airway sounds. CARDIAC:  Regular rate and rhythm without murmur. ABDOMEN:  Soft, nontender.  Good bowel sounds. EXTREMITIES:  Right-sided weakness.  4/5 deltoid, biceps, triceps.  3+/5 hip flexors, 4/5 knee extension.  REHABILITATION HOSPITAL COURSE:  The patient was admitted to inpatient rehab services.  The following issues were addressed during the patient's rehab course.  Pertaining to Ms. Dupuis's left hemisphere temporal-occipital lobes, bilateral MCA territory infarct, remained on aspirin therapy.  Followed by Neurology Services.  Subcutaneous Lovenox for DVT prophylaxis.  She was on a mechanical soft, honey-thick liquid diet.  Followed by Speech Therapy.  Long history of COPD with nebulizer treatments utilizing flutter valve incentive spirometry.  On the late evening of April 14, 2016, the patient with some increased difficulty in breathing.  Oxygen saturations still remained above  90% with chronic oxygen therapy.  A chest x-ray was completed that showed new right basilar pneumonia, placed on intravenous Zosyn.  The patient did spike a low-grade fever.  Blood cultures were pending.  Latest WBC of 14,400. Lactic acid 2.0.  Critical Care was consulted in regard to suspect aspiration pneumonia.  She was  transferred to Acute Care Services for ongoing care and monitoring and medication adjustments at that time. She could be readmitted back to inpatient rehab services as medically stable.     Mariam Dollaraniel Angiulli, P.A.   ______________________________ Erick ColaceAndrew E. Kirsteins, M.D.    DA/MEDQ  D:  04/15/2016  T:  04/15/2016  Job:  [id number]

## 2016-04-15 NOTE — Progress Notes (Signed)
eLink Physician-Brief Progress Note Patient Name: Catherine ClossRegina Day DOB: 07/09/1927 MRN: [id number]   Date of Service  04/15/2016  HPI/Events of Note  Hypokalemia and hypophosphatemia  eICU Interventions  Potassium and phos replaced     Intervention Category Intermediate Interventions: Electrolyte abnormality - evaluation and management  DETERDING,ELIZABETH 04/15/2016, 2:59 AM

## 2016-04-15 NOTE — Progress Notes (Signed)
Called Anissa LPN to see if pts son had been notified of transfer. Anissa stated he had not but she would call him to make him aware.

## 2016-04-15 NOTE — Progress Notes (Signed)
Patient found sitting on floor / incontinent of urine / feces.  No injuries noted.  Daughter notified as well as MD.

## 2016-04-15 NOTE — Progress Notes (Signed)
Pharmacy Antibiotic Note  Catherine ClossRegina Day is a 82 y.o. female initially admitted to [facility] on 11/23 with CVA, then transferred to inpatient rehab on 11/28. Pt developed SOB and increased O2 requirement tonight and has been readmitted. Pharmacy has been consulted for Vancomycin Zosyn dosing for possible HCAP.  Plan: Vancomycin 1500 mg IV now, then 750 mg IV q24h  .Zosyn 3.375 g IV q8h     Temp (24hrs), Avg:98.6 F (37 C), Min:97.6 F (36.4 C), Max:102.5 F (39.2 C)   Recent Labs Lab 04/11/16 0530 04/12/16 0220 04/13/16 0432 04/14/16 0530 04/14/16 1534 04/14/16 2250 04/14/16 2255  WBC 15.1* 10.3 11.1*  --  10.0 14.4*  --   CREATININE 0.71 0.63 0.67 0.50 0.70 0.76  --   LATICACIDVEN  --   --   --   --   --   --  2.0*    Estimated Creatinine Clearance: 57.5 mL/min (by C-G formula based on SCr of 0.76 mg/dL).    No Known Allergies  04/15/2016 12:59 AM

## 2016-04-15 NOTE — Progress Notes (Signed)
Rapid Response Event Note Call received per floor RN regarding Pt with increased WOB. RT called to bedside. NEB Tx given and Pt improved now on 2LNC 02 sats 90s. Advised RN to notify MD on call and suggest CXR. Unable to come right away, Pt seen at 2205 resting in bed.   Overview: Time Called: 2117 Arrival Time: 2205 Event Type: Respiratory  Initial Focused Assessment: Pt found resting in bed. Po2 98% 2 L [place] BP 143/66 HR 115, RR 17. Lung sounds course, crackles heard in upper lobes, wheezing on expiration and worsened when awake. Pt awakens easily to voice follows simple commands, mild confusion, picks at BP cuff, denies pain or discomfort at this time. Once awakened Pt WOB increased and appears in distress, Po2 92-96% on 3 LNC RR 30-40s. Rectal temp obtained yielding 102.5  Interventions: RN advised to updated PA on CXR results, Zosyn ordered per Pharmacy and labs. Rectal tylenol given and lactic acid order placed per protocol. Pt WOB continued to worsen, RR 40s, NT suction done yielding small amount tan secretions. Additional NEB treatment given. PA updated at 2300 , PCCM to consult.   Joneen RoachPaul Hoffman NP at bedside 2340, Pt assessed. Pt for admit to SDU per PCCM service. Pt improved some after second NEB. Pt able to speak in full sentences but remains with increased WOB and expiratory wheezes. Harvel Ricksan Anguilli PA updated by myself, PT for D/C from rehab and admit inpatient per PCCM, bed placement notified.   Transferred to 4 N 11 SDU at 0045, care assumed per SDU RN  Event Summary: Name of Physician Notified: Harvel Ricksan Anguilli PA at 2120    at          [place], James IvanoffBrooke Leigh Aerie Donica

## 2016-04-15 NOTE — H&P (Signed)
PULMONARY / CRITICAL CARE MEDICINE   Name: Catherine Day MRN: [id number] DOB: 06/09/1927    ADMISSION DATE:  (Not on file) CONSULTATION DATE:  04/15/2016  REFERRING MD:  Dr Riley KillSwartz CIR  CHIEF COMPLAINT:  Dyspnea  HISTORY OF PRESENT ILLNESS:   Catherine ClossRegina Day is a 82 y.o. right handed female with history of COPD. Per chart review patient lives in [place] with her daughter. Independent prior to admission. She had been visiting a son in [place]. Presented 04/10/2016 with sudden onset of right-sided weakness and inability to speak. CT/ MRI showed small volume of scattered acute infarct in the left hemisphere primarily in the left temporal and occipital lobes with petechial hemorrhage in the left temporal lobe. No associated mass effect. Small number of other scattered small infarcts in the bilateral MCA and left cerebellar artery territories. Patient did receive TPA. MRI showed possible occlusion of the left PCA P3 inferior division.. Echocardiogram with systolic function mildly reduced. Grade 1 diastolic dysfunction. Underwent bilateral common carotid arteriograms followed by complete revascularization of occluded left MCA, left ACA and left ICA terminus. Neurology follow-up currently maintained on aspirin for CVA prophylaxis. MBS completed 04/12/2016 advised mechanical soft honey thick liquid diet. [facility], however, later that night she became dyspneic and diaphoretic. She was given albuterol nebs with some improvement. CXR with RLL infiltrate. PCCM asked to see.  PAST MEDICAL HISTORY :  She  has a past medical history of COPD (chronic obstructive pulmonary disease) (HCC).  PAST SURGICAL HISTORY: She  has a past surgical history that includes Pelvic fracture surgery; Radiology with anesthesia (N/A, 04/09/2016); ir generic historical (04/09/2016); ir generic historical (04/09/2016); and TEE without cardioversion (N/A, 04/13/2016).  No Known Allergies  Current  Facility-Administered Medications on File Prior to Encounter  Medication  . acetaminophen (TYLENOL) tablet 650 mg   Or  . acetaminophen (TYLENOL) suppository 650 mg  . aspirin tablet 325 mg  . atorvastatin (LIPITOR) tablet 20 mg  . enoxaparin (LOVENOX) injection 40 mg  . fluticasone furoate-vilanterol (BREO ELLIPTA) 100-25 MCG/INH 1 puff  . levalbuterol (XOPENEX) nebulizer solution 0.63 mg  . levalbuterol (XOPENEX) nebulizer solution 0.63 mg  . MEDLINE mouth rinse  . ondansetron (ZOFRAN) tablet 4 mg   Or  . ondansetron (ZOFRAN) injection 4 mg  . RESOURCE THICKENUP CLEAR  . sorbitol 70 % solution 30 mL  . tiotropium (SPIRIVA) inhalation capsule 18 mcg  . tobramycin (PF) (TOBI) nebulizer solution 300 mg   Current Outpatient Prescriptions on File Prior to Encounter  Medication Sig  . Calcium Citrate-Vitamin D (CITRACAL + D PO) Take 1 tablet by mouth daily.  Marland Kitchen. denosumab (PROLIA) 60 MG/ML SOLN injection Inject 60 mg into the skin every 6 (six) months. Administer in upper arm, thigh, or abdomen Last injection 03/24/16  . fluticasone furoate-vilanterol (BREO ELLIPTA) 100-25 MCG/INH AEPB Inhale 1 puff into the lungs daily.  Marland Kitchen. ipratropium-albuterol (DUONEB) 0.5-2.5 (3) MG/3ML SOLN Take 3 mLs by nebulization See admin instructions. Inhale 1 vial by nebulization every morning and may also use 2 more times during the day as needed for shortness of breath or wheezing  . levalbuterol (XOPENEX HFA) 45 MCG/ACT inhaler Inhale 1-2 puffs into the lungs every 4 (four) hours as needed for wheezing or shortness of breath.  . methylPREDNISolone (MEDROL DOSEPAK) 4 MG TBPK tablet Take 4-8 mg by mouth See admin instructions. Tapered course started 04/09/16 am -Day 1: take 2 tablets (8 mg) by mouth with breakfast, 1 tablet (4 mg) with  lunch and supper and 2 tablets at bedtime; Day 2: take 1 tablet with breakfast, lunch and supper and 2 tablets at bedtime; Day 3: take 1 tablet with breakfast, lunch, supper and at  bedtime; Day 4: take 1 tablet with breakfast, lunch and at bedtime; Day 5: take 1 tablet with breakfast and at bedtime; Day 6: take 1 tablet with breakfast  . OXYGEN Inhale 2 L into the lungs continuous.  Marland Kitchen. tiotropium (SPIRIVA) 18 MCG inhalation capsule Place 18 mcg into inhaler and inhale daily.  Marland Kitchen. tobramycin, PF, (TOBI) 300 MG/5ML nebulizer solution Take 300 mg by nebulization daily.  . valsartan-hydrochlorothiazide (DIOVAN-HCT) 80-12.5 MG tablet Take 1 tablet by mouth daily.    FAMILY HISTORY:  Her has no family status information on file.    SOCIAL HISTORY: She  reports that she has never smoked. She has never used smokeless tobacco. She reports that she does not drink alcohol or use drugs.  REVIEW OF SYSTEMS:   Bolds are positive  Constitutional: weight loss, gain, night sweats, Fevers, chills, fatigue .  HEENT: headaches, Sore throat, sneezing, nasal congestion, post nasal drip, Difficulty swallowing, Tooth/dental problems, visual complaints visual changes, ear ache CV:  chest pain, radiates: ,Orthopnea, PND, swelling in lower extremities, dizziness, palpitations, syncope.  GI  heartburn, indigestion, abdominal pain, nausea, vomiting, diarrhea, change in bowel habits, loss of appetite, bloody stools.  Resp: cough, productive: , hemoptysis, dyspnea (for years, tonight no worse), chest pain, pleuritic.  Skin: rash or itching or icterus GU: dysuria, change in color of urine, urgency or frequency. flank pain, hematuria  MS: joint pain or swelling. decreased range of motion  Psych: change in mood or affect. depression or anxiety.  Neuro: difficulty with speech, weakness, numbness, ataxia    SUBJECTIVE:    VITAL SIGNS: There were no vitals taken for this visit.  HEMODYNAMICS:    VENTILATOR SETTINGS:    INTAKE / OUTPUT: No intake/output data recorded.  PHYSICAL EXAMINATION: General:  Elderly female in mild distress  Neuro:  Alert, oriented HEENT:  /AT, PERRL, no  JVD Cardiovascular:  RRR, no MRG. +1 BLE edema Lungs:  Coarse rhonchi, some transmitted upper airway sounds as well.  Abdomen:  Soft, non-tender, non-distended. Musculoskeletal:  No acute deformity Skin:  Grossly intact  LABS:  BMET  Recent Labs Lab 04/13/16 0432 04/14/16 0530 04/14/16 1534 04/14/16 2250  NA 136 139  --  139  K 3.2* 3.2*  --  3.8  CL 108 110  --  108  CO2 23 23  --  22  BUN 11 5*  --  10  CREATININE 0.67 0.50 0.70 0.76  GLUCOSE 114* 98  --  141*    Electrolytes  Recent Labs Lab 04/13/16 0432 04/14/16 0530 04/14/16 2250  CALCIUM 6.9* 6.9* 7.3*    CBC  Recent Labs Lab 04/13/16 0432 04/14/16 1534 04/14/16 2250  WBC 11.1* 10.0 14.4*  HGB 9.9* 10.4* 11.7*  HCT 31.2* 31.4* 35.9*  PLT 173 183 234    Coag's  Recent Labs Lab 04/09/16 1827  APTT 25  INR 1.14    Sepsis Markers  Recent Labs Lab 04/14/16 2255  LATICACIDVEN 2.0*    ABG  Recent Labs Lab 04/09/16 0255  PHART 7.403  PCO2ART 35.1  PO2ART 87.7    Liver Enzymes  Recent Labs Lab 04/09/16 1827 04/14/16 2250  AST 22 30  ALT 17 22  ALKPHOS 61 68  BILITOT 0.7 0.7  ALBUMIN 3.2* 2.8*    Cardiac Enzymes No  results for input(s): TROPONINI, PROBNP in the last 168 hours.  Glucose  Recent Labs Lab 04/09/16 1825 04/09/16 2259  GLUCAP 224* 126*    Imaging Dg Chest Port 1 View  Result Date: 04/14/2016 CLINICAL DATA:  Acute onset of shortness of breath and cough. Initial encounter. EXAM: PORTABLE CHEST 1 VIEW COMPARISON:  Chest radiograph performed 04/11/2016 FINDINGS: Right basilar airspace opacity raises concern for pneumonia. No pleural effusion or pneumothorax is seen. The cardiomediastinal silhouette is borderline normal in size. No acute osseous abnormalities are identified. IMPRESSION: Right basilar pneumonia noted. Electronically Signed   By: Roanna Raider M.D.   On: 04/14/2016 21:50     STUDIES:    CULTURES: Blood 11/29 >  ANTIBIOTICS: Zosyn  11/29 > Vancomycin 11/29 >  SIGNIFICANT EVENTS: 11/23 admit for stroke 11/28 discharge to CIR 11/29 transfer back to inpatient for suspected aspiration.   LINES/TUBES:   DISCUSSION: 82 year old female recently admitted for stroke and discharged to CIR. While in CIR developed worsening SOB. Suspect that she may have aspirated.  ASSESSMENT / PLAN:  Respiratory distress - etiology uncertain, however, this is likely multifactorial due to aspiration and COPD. She recently suffered stroke and had MBS suggesting mechanical soft honey thick liquid diet. Based on CXR and pooled oral secretions without much of a cough reflex I think it is likely she is aspirating. Also could be some component of COPD exacerbation as she did have some improvement with albuterol nebs, however, she does not have active bronchospasm on exam.   Plan: - Supplemental O2 to keep SpO2 > 92% - Add HCAP coverage with zosyn and vancomycin - Culture blood - Trend PCT - Re-assess CXR in AM to see in PNA develops.  - Scheduled and PRN bronchodilators - Solumedrol 40mg  BID - Holding home breo, spriva while on nebs  Recent CVA s/p IV TPA and IR treatments with R hemiparesthesia and dysphagia - NPO - Ongoing PT/OT - Hopefully can go back to CIR shortly - ASA 325 daily, diovan  Joneen Roach, AGACNP-BC [facility] Pulmonology/Critical Care Pager [phone] or [phone]   04/15/2016 12:43 AM

## 2016-04-15 NOTE — Progress Notes (Signed)
Urine noted to be cloudy and amber in color / malodorous.  Urine sent for U/A.

## 2016-04-15 NOTE — Progress Notes (Signed)
PULMONARY / CRITICAL CARE MEDICINE   Name: Catherine ClossRegina Rivere MRN: [id number] DOB: 01/13/1928    ADMISSION DATE:  04/15/2016 CONSULTATION DATE:  04/15/2016  REFERRING MD:  Dr Riley KillSwartz CIR  CHIEF COMPLAINT:  Dyspnea  HISTORY OF PRESENT ILLNESS:   Catherine Day is a 82 y.o. right handed female with history of COPD. Per chart review patient lives in [place] with her daughter. Independent prior to admission. She had been visiting a [place] in [place]. Presented 04/10/2016 with sudden onset of right-sided weakness and inability to speak. CT/ MRI showed small volume of scattered acute infarct in the left hemisphere primarily in the left temporal and occipital lobes with petechial hemorrhage in the left temporal lobe. No associated mass effect. Small number of other scattered small infarcts in the bilateral MCA and left cerebellar artery territories. Patient did receive TPA. MRI showed possible occlusion of the left PCA P3 inferior division.. Echocardiogram with systolic function mildly reduced. Grade 1 diastolic dysfunction. Underwent bilateral common carotid arteriograms followed by complete revascularization of occluded left MCA, left ACA and left ICA terminus. Neurology follow-up currently maintained on aspirin for CVA prophylaxis. MBS completed 04/12/2016 advised mechanical soft honey thick liquid diet. [facility] was [facility], however, later that night she became dyspneic and diaphoretic. She was given albuterol nebs with some improvement. CXR with RLL infiltrate. PCCM asked to see.   SUBJECTIVE:   Respiratory distress, fever overnight requiring SDU tx/PCCM consult .  Congested, weak cough.  FOul smelling urine per RN. UOP trending down. On 4L [place].    VITAL SIGNS: BP (!) 104/51 (BP Location: Right Wrist)   Pulse 80   Temp 97.4 F (36.3 C) (Oral)   Resp 18   Ht 5\' 6"  (1.676 m)   Wt 92.1 kg (203 lb)   SpO2 98%   BMI 32.77 kg/m   HEMODYNAMICS:    VENTILATOR SETTINGS:    INTAKE /  OUTPUT: I/O last 3 completed shifts: In: 665.5 [I.V.:55.5; IV Piggyback:610] Out: -   PHYSICAL EXAMINATION: General:  Elderly female in NAD  Neuro:  Drowsy but easily arousable, follows commands, nods appropriately, gen weakness  HEENT:  [place]/AT, PERRL, no JVD Cardiovascular:  RRR, no MRG. +1 BLE edema Lungs:  Coarse rhonchi, weak cough, rare exp wheeze   Abdomen:  Soft, non-tender, non-distended. Musculoskeletal:  No acute deformity Skin:  Grossly intact  LABS:  BMET  Recent Labs Lab 04/14/16 0530 04/14/16 1534 04/14/16 2250 04/15/16 0158  NA 139  --  139 139  K 3.2*  --  3.8 3.3*  CL 110  --  108 109  CO2 23  --  22 20*  BUN 5*  --  10 11  CREATININE 0.50 0.70 0.76 0.72  GLUCOSE 98  --  141* 141*    Electrolytes  Recent Labs Lab 04/14/16 0530 04/14/16 2250 04/15/16 0158  CALCIUM 6.9* 7.3* 7.1*  MG  --   --  1.7  PHOS  --   --  1.3*    CBC  Recent Labs Lab 04/14/16 1534 04/14/16 2250 04/15/16 0158  WBC 10.0 14.4* 12.3*  HGB 10.4* 11.7* 10.4*  HCT 31.4* 35.9* 32.0*  PLT 183 234 187    Coag's  Recent Labs Lab 04/09/16 1827  APTT 25  INR 1.14    Sepsis Markers  Recent Labs Lab 04/14/16 2255 04/15/16 0158  LATICACIDVEN 2.0*  --   PROCALCITON  --  0.13    ABG  Recent Labs Lab 04/09/16 0255  PHART  7.403  PCO2ART 35.1  PO2ART 87.7    Liver Enzymes  Recent Labs Lab 04/09/16 1827 04/14/16 2250  AST 22 30  ALT 17 22  ALKPHOS 61 68  BILITOT 0.7 0.7  ALBUMIN 3.2* 2.8*    Cardiac Enzymes No results for input(s): TROPONINI, PROBNP in the last 168 hours.  Glucose  Recent Labs Lab 04/09/16 1825 04/09/16 2259  GLUCAP 224* 126*    Imaging Dg Chest Port 1 View  Result Date: 04/14/2016 CLINICAL DATA:  Acute onset of shortness of breath and cough. Initial encounter. EXAM: PORTABLE CHEST 1 VIEW COMPARISON:  Chest radiograph performed 04/11/2016 FINDINGS: Right basilar airspace opacity raises concern for pneumonia. No  pleural effusion or pneumothorax is seen. The cardiomediastinal silhouette is borderline normal in size. No acute osseous abnormalities are identified. IMPRESSION: Right basilar pneumonia noted. Electronically Signed   By: Roanna RaiderJeffery  Chang M.D.   On: 04/14/2016 21:50     STUDIES:    CULTURES: Blood 11/29 >  ANTIBIOTICS: Zosyn 11/29 > Vancomycin 11/29 >  SIGNIFICANT EVENTS: 11/23 admit for stroke 11/28 discharge to CIR 11/29 transfer back to inpatient SDU for suspected aspiration/PNA.   LINES/TUBES:   DISCUSSION: [age] year old female recently admitted for stroke and discharged to CIR. While in CIR developed fever, respiratory distress likely r/t aspiration PNA.  ASSESSMENT / PLAN:  Acute respiratory failure - likely multifactorial r/t aspiration PNA, COPD, generalized weakness/deconditioning post CVA.  PNA - suspect aspiration  Mild AECOPD SIRS - mildly elevated lactate, borderline BP.  ?UTI  Plan: - Supplemental O2 to keep SpO2 > 92% - HCAP coverage with zosyn and vancomycin - NPO for now  - consider repeat speech eval prior to PO intake  - blood, urine cultures  - 500ml NS bolus x 1  - Trend PCT - f/u CXR in am  - pulmonary hygiene as able  - Scheduled and PRN bronchodilators - Solumedrol 40mg  BID - Holding home breo, spriva while on nebs  Recent CVA s/p IV TPA and IR treatments with R hemiparesthesia and dysphagia Plan:  - NPO - Ongoing PT/OT - ASA 325 daily, diovan  Hypokalemia   Plan:  - replete K, phos as needed  - f/u chem in am    Need to discuss overall goals of care with family, especially given suspected ongoing aspiration.   Will ask [facility] to assume care in am 11/30.   Dirk DressKaty Valentina Alcoser, NP 04/15/2016  9:52 AM Pager: [phone] or [phone]

## 2016-04-15 NOTE — Progress Notes (Signed)
Patient noted to be in respiratory distress O2 sats dropped to 84%.  T 97.9 P 126 R 38 BP 189/90 Neb treatment given. Patient O2 sats improved to 97%. Patient with audible wheezing and Rhonchi to the bilateral upper lobes. Rapid response paged. Rapid response unavailable. MD paged. Orders given for portable chest x-ray, CBC, BMET. Rapid arrived at 2205. Patient's still using accessory muscles to breath. Chest x-ray result positive for pneumonia. MD notified. Orders given for Zosyn. Rectal temp 102.5. Rectal tylenol given and lactic acid order per rapid response. Elevated Lactic acid. PCCM came to consult. PCCM recommended transfer to stepdown. Report given to nurse at bedside at 0045.

## 2016-04-15 NOTE — Discharge Summary (Deleted)
  The note originally documented on this encounter has been moved the the encounter in which it belongs.  

## 2016-04-16 ENCOUNTER — Inpatient Hospital Stay (HOSPITAL_COMMUNITY): Payer: Medicare Other

## 2016-04-16 DIAGNOSIS — I5042 Chronic combined systolic (congestive) and diastolic (congestive) heart failure: Secondary | ICD-10-CM

## 2016-04-16 DIAGNOSIS — I5022 Chronic systolic (congestive) heart failure: Secondary | ICD-10-CM

## 2016-04-16 DIAGNOSIS — I63013 Cerebral infarction due to thrombosis of bilateral vertebral arteries: Secondary | ICD-10-CM

## 2016-04-16 DIAGNOSIS — J690 Pneumonitis due to inhalation of food and vomit: Secondary | ICD-10-CM

## 2016-04-16 LAB — BASIC METABOLIC PANEL
ANION GAP: 8 (ref 5–15)
BUN: 9 mg/dL (ref 6–20)
CALCIUM: 6.5 mg/dL — AB (ref 8.9–10.3)
CO2: 24 mmol/L (ref 22–32)
Chloride: 110 mmol/L (ref 101–111)
Creatinine, Ser: 0.53 mg/dL (ref 0.44–1.00)
GFR calc Af Amer: >60 mL/min (ref >60)
GLUCOSE: 100 mg/dL — AB (ref 65–99)
Potassium: 3.4 mmol/L — ABNORMAL LOW (ref 3.5–5.1)
Sodium: 142 mmol/L (ref 135–145)

## 2016-04-16 LAB — CBC
HCT: 29.5 % — ABNORMAL LOW (ref 36.0–46.0)
Hemoglobin: 9.4 g/dL — ABNORMAL LOW (ref 12.0–15.0)
MCH: 26.7 pg (ref 26.0–34.0)
MCHC: 31.9 g/dL (ref 30.0–36.0)
MCV: 83.8 fL (ref 78.0–100.0)
PLATELETS: 234 10*3/uL (ref 150–400)
RBC: 3.52 MIL/uL — ABNORMAL LOW (ref 3.87–5.11)
RDW: 14.5 % (ref 11.5–15.5)
WBC: 8.8 10*3/uL (ref 4.0–10.5)

## 2016-04-16 LAB — LACTIC ACID, PLASMA: LACTIC ACID, VENOUS: 1.1 mmol/L (ref 0.5–1.9)

## 2016-04-16 LAB — PHOSPHORUS: Phosphorus: 2.2 mg/dL — ABNORMAL LOW (ref 2.5–4.6)

## 2016-04-16 LAB — PROCALCITONIN: PROCALCITONIN: 0.14 ng/mL

## 2016-04-16 MED ORDER — POTASSIUM CHLORIDE 2 MEQ/ML IV SOLN
30.0000 meq | Freq: Two times a day (BID) | INTRAVENOUS | Status: AC
  Administered 2016-04-16 (×2): 30 meq via INTRAVENOUS
  Filled 2016-04-16 (×2): qty 15

## 2016-04-16 NOTE — Progress Notes (Signed)
PROGRESS NOTE    Catherine Day  ZOX:[id number] DOB: 1927-08-10 DOA: 04/15/2016 PCP: No primary care provider on file.   Brief Narrative:  82 year old WF right-handed female PMHx COPD (on 2 L O2 at home), HLD,.   Lives in [place] with her daughter, independent prior to admission as she was visiting family in [place].  Presented on April 10, 2016, with sudden onset of right-sided weakness, inability to speak.  CT and MRI showed small volume of scattered acute infarct in the left hemisphere primarily in the left temporal and occipital lobes.  No associated mass effect.  Small number of other scattered small bilateral MCA left cerebellar artery territory infarcts.The patient did receive tPA.  MRI showed possible occlusion of the left PCA-P3 inferior division.  Echocardiogram with ejection fraction, reduced to grade 1 diastolic dysfunction.  Underwent bilateral common carotid arteriograms followed by complete revascularization of occluded left MCA, left ACA, left ICA terminus.  Neurology consulted, maintained on aspirin therapy for CVA prophylaxis.  TEE completed showing no intracardiac source of embolism or shunt.  Modified barium swallow, on a mechanical soft, honey-thick liquid diet.  Subcutaneous Lovenox for DVT prophylaxis.  Physical and occupational therapy ongoing.  The patient was admitted for a comprehensive rehab program.   Subjective: 11/30  A/O 2 (does not know where, why). Does know she is in hospital and that she had falls. States at home walks around without assist. Negative swelling in her extremities at home.   Assessment & Plan:   Active Problems:   Aspiration pneumonia (HCC)   Aspiration pneumonia/pneumonitis - Complete 5 days antibiotics -DuoNeb QID -Solu-Medrol 40 mg daily -Physiotherapy vest QID -Flutter valve -Titrate O2 to maintain SPO2> 90% -Swallow study: Passed swallow study dysphagia 2 fluid pudding thick  Recent CVA - s/p IV TPA and IR treatments with R  hemiparesthesia and dysphagia - Ongoing PT/OT - Hopefully can go back to CIR shortly - ASA 325 daily, diovan  Chronic Systolic CHF -Strict in and out -Daily weight  Hypokalemia -Potassium goal> 4 -Potassium IV 30 mEq 2   DVT prophylaxis: Subcutaneous heparin Code Status: Full Family Communication: None Disposition Plan: CIR   Consultants:  None  Procedures/Significant Events:  11/23 admit for stroke 11/25 echocardiogram:Left ventricle: Systolic function was probably mildly reduced. -(grade 1 diastolic dysfunction). 11/27 TEE: LVEF= 45% to 50%. - hypokinesis of the inferior and inferoseptal myocardium. 11/28 discharge to CIR 11/29 transfer back to inpatient for suspected aspiration.    VENTILATOR SETTINGS:    Cultures 11/29 blood pending 11/30 urine pending   Antimicrobials: Zosyn 11/29 > Vancomycin 11/29 >    Devices    LINES / TUBES:      Continuous Infusions: . sodium chloride 10 mL/hr at 04/15/16 0127     Objective: Vitals:   04/16/16 0359 04/16/16 0400 04/16/16 0700 04/16/16 0804  BP:  121/60 (!) 146/80   Pulse:  80 (!) 42   Resp:  19 (!) 22   Temp: 98 F (36.7 C)  98.4 F (36.9 C)   TempSrc: Oral  Oral   SpO2:  97% 97% 100%  Weight: 84.3 kg (185 lb 14.4 oz)     Height:        Intake/Output Summary (Last 24 hours) at 04/16/16 1034 Last data filed at 04/16/16 1000  Gross per 24 hour  Intake              580 ml  Output  350 ml  Net              230 ml   Filed Weights   04/15/16 0337 04/16/16 0359  Weight: 92.1 kg (203 lb) 84.3 kg (185 lb 14.4 oz)    Examination:  General: A/O 2 (does not know where, why). Does know she is in hospital and that she had falls., positive acute respiratory distress Eyes: negative scleral hemorrhage, negative anisocoria, negative icterus ENT: Negative Runny nose, negative gingival bleeding, Neck:  Negative scars, masses, torticollis, lymphadenopathy, JVD Lungs: diffuse rhonchi,  positive expiratory wheezing  Cardiovascular: Regular rate and rhythm without murmur gallop or rub normal S1 and S2 Abdomen: negative abdominal pain, nondistended, positive soft, bowel sounds, no rebound, no ascites, no appreciable mass Extremities: No significant cyanosis, clubbing, or edema bilateral lower extremities Skin: Negative rashes, lesions, ulcers Psychiatric:  Negative depression, negative anxiety, negative fatigue, negative mania  Central nervous system:  Cranial nerves II through XII intact, tongue/uvula midline, all extremities muscle strength 5/5, sensation intact throughout,  negative dysarthria, negative expressive aphasia, negative receptive aphasia.  .     Data Reviewed: Care during the described time interval was provided by me .  I have reviewed this patient's available data, including medical history, events of note, physical examination, and all test results as part of my evaluation. I have personally reviewed and interpreted all radiology studies.  CBC:  Recent Labs Lab 04/09/16 1827  04/10/16 0636  04/13/16 0432 04/14/16 1534 04/14/16 2250 04/15/16 0158 04/16/16 0310  WBC 12.9*  --  16.6*  < > 11.1* 10.0 14.4* 12.3* 8.8  NEUTROABS 11.7*  --  14.1*  --   --   --  13.4*  --   --   HGB 14.5  < > 12.1  < > 9.9* 10.4* 11.7* 10.4* 9.4*  HCT 44.1  < > 37.6  < > 31.2* 31.4* 35.9* 32.0* 29.5*  MCV 83.8  --  84.1  < > 84.6 84.2 84.7 83.8 83.8  PLT 168  --  142*  < > 173 183 234 187 234  < > = values in this interval not displayed. Basic Metabolic Panel:  Recent Labs Lab 04/13/16 0432 04/14/16 0530 04/14/16 1534 04/14/16 2250 04/15/16 0158 04/16/16 0310  NA 136 139  --  139 139 142  K 3.2* 3.2*  --  3.8 3.3* 3.4*  CL 108 110  --  108 109 110  CO2 23 23  --  22 20* 24  GLUCOSE 114* 98  --  141* 141* 100*  BUN 11 5*  --  10 11 9   CREATININE 0.67 0.50 0.70 0.76 0.72 0.53  CALCIUM 6.9* 6.9*  --  7.3* 7.1* 6.5*  MG  --   --   --   --  1.7  --   PHOS  --    --   --   --  1.3* 2.2*   GFR: Estimated Creatinine Clearance: 55.2 mL/min (by C-G formula based on SCr of 0.53 mg/dL). Liver Function Tests:  Recent Labs Lab 04/09/16 1827 04/14/16 2250  AST 22 30  ALT 17 22  ALKPHOS 61 68  BILITOT 0.7 0.7  PROT 7.5 6.6  ALBUMIN 3.2* 2.8*   No results for input(s): LIPASE, AMYLASE in the last 168 hours. No results for input(s): AMMONIA in the last 168 hours. Coagulation Profile:  Recent Labs Lab 04/09/16 1827  INR 1.14   Cardiac Enzymes: No results for input(s): CKTOTAL, CKMB, CKMBINDEX, TROPONINI in the  last 168 hours. BNP (last 3 results) No results for input(s): PROBNP in the last 8760 hours. HbA1C: No results for input(s): HGBA1C in the last 72 hours. CBG:  Recent Labs Lab 04/09/16 1825 04/09/16 2259  GLUCAP 224* 126*   Lipid Profile: No results for input(s): CHOL, HDL, LDLCALC, TRIG, CHOLHDL, LDLDIRECT in the last 72 hours. Thyroid Function Tests: No results for input(s): TSH, T4TOTAL, FREET4, T3FREE, THYROIDAB in the last 72 hours. Anemia Panel: No results for input(s): VITAMINB12, FOLATE, FERRITIN, TIBC, IRON, RETICCTPCT in the last 72 hours. Urine analysis:    Component Value Date/Time   COLORURINE YELLOW 04/15/2016 0947   APPEARANCEUR HAZY (A) 04/15/2016 0947   LABSPEC 1.018 04/15/2016 0947   PHURINE 5.5 04/15/2016 0947   GLUCOSEU NEGATIVE 04/15/2016 0947   HGBUR TRACE (A) 04/15/2016 0947   BILIRUBINUR NEGATIVE 04/15/2016 0947   KETONESUR NEGATIVE 04/15/2016 0947   PROTEINUR NEGATIVE 04/15/2016 0947   NITRITE POSITIVE (A) 04/15/2016 0947   LEUKOCYTESUR MODERATE (A) 04/15/2016 0947   Sepsis Labs: @LABRCNTIP (procalcitonin:4,lacticidven:4)  ) Recent Results (from the past 240 hour(s))  MRSA PCR Screening     Status: None   Collection Time: 04/09/16 11:06 PM  Result Value Ref Range Status   MRSA by PCR NEGATIVE NEGATIVE Final    Comment:        The GeneXpert MRSA Assay (FDA approved for NASAL  specimens only), is one component of a comprehensive MRSA colonization surveillance program. It is not intended to diagnose MRSA infection nor to guide or monitor treatment for MRSA infections.          Radiology Studies: Dg Chest Port 1 View  Result Date: 04/16/2016 CLINICAL DATA:  Acute onset of shortness of breath. Initial encounter. EXAM: PORTABLE CHEST 1 VIEW COMPARISON:  Chest radiograph performed 04/14/2016 FINDINGS: Persistent right basilar airspace opacity remains concerning for pneumonia. No definite pleural effusion or pneumothorax is seen. Mild vascular congestion is noted. The cardiomediastinal silhouette is mildly enlarged. No acute osseous abnormalities are identified. IMPRESSION: 1. Persistent right basilar airspace opacity remains concerning for pneumonia. 2. Mild vascular congestion and mild cardiomegaly. Electronically Signed   By: Roanna RaiderJeffery  Chang M.D.   On: 04/16/2016 06:26   Dg Chest Port 1 View  Result Date: 04/14/2016 CLINICAL DATA:  Acute onset of shortness of breath and cough. Initial encounter. EXAM: PORTABLE CHEST 1 VIEW COMPARISON:  Chest radiograph performed 04/11/2016 FINDINGS: Right basilar airspace opacity raises concern for pneumonia. No pleural effusion or pneumothorax is seen. The cardiomediastinal silhouette is borderline normal in size. No acute osseous abnormalities are identified. IMPRESSION: Right basilar pneumonia noted. Electronically Signed   By: Roanna RaiderJeffery  Chang M.D.   On: 04/14/2016 21:50        Scheduled Meds: . aspirin  325 mg Oral Daily  . famotidine (PEPCID) IV  20 mg Intravenous Q12H  . heparin  5,000 Units Subcutaneous Q8H  . irbesartan  75 mg Oral Daily   And  . hydrochlorothiazide  12.5 mg Oral Daily  . ipratropium-albuterol  3 mL Nebulization Q6H  . mouth rinse  15 mL Mouth Rinse BID  . methylPREDNISolone (SOLU-MEDROL) injection  40 mg Intravenous Daily  . piperacillin-tazobactam (ZOSYN)  IV  3.375 g Intravenous Q8H  .  vancomycin  750 mg Intravenous Q24H   Continuous Infusions: . sodium chloride 10 mL/hr at 04/15/16 0127     LOS: 1 day    Time spent: 40 minutes    Criag Wicklund, Roselind MessierURTIS J, MD [facility] Pager [phone]   If  7PM-7AM, please contact night-coverage [email or web address] Password [email or web address] 04/16/2016, 10:34 AM

## 2016-04-16 NOTE — Evaluation (Signed)
Clinical/Bedside Swallow Evaluation Patient Details  Name: Catherine Day MRN: [id number] Date of Birth: 02/23/1928  Today's Date: 04/16/2016 Time: SLP SHughie Closstart Time (ACUTE ONLY): 1402 SLP Stop Time (ACUTE ONLY): 1419 SLP Time Calculation (min) (ACUTE ONLY): 17 min  Past Medical History:  Past Medical History:  Diagnosis Date  . COPD (chronic obstructive pulmonary disease) (HCC)    Past Surgical History:  Past Surgical History:  Procedure Laterality Date  . IR GENERIC HISTORICAL  04/09/2016   IR ANGIO INTRA EXTRACRAN SEL COM CAROTID INNOMINATE UNI R MOD SED 04/09/2016 Julieanne CottonSanjeev Deveshwar, MD [facility]  . IR GENERIC HISTORICAL  04/09/2016   IR PERCUTANEOUS ART THROMBECTOMY/INFUSION INTRACRANIAL INC DIAG ANGIO 04/09/2016 Julieanne CottonSanjeev Deveshwar, MD [facility]  . PELVIC FRACTURE SURGERY    . RADIOLOGY WITH ANESTHESIA N/A 04/09/2016   Procedure: RADIOLOGY WITH ANESTHESIA;  Surgeon: Julieanne CottonSanjeev Deveshwar, MD;  Location: [facility] OR;  Service: Radiology;  Laterality: N/A;  . TEE WITHOUT CARDIOVERSION N/A 04/13/2016   Procedure: TRANSESOPHAGEAL ECHOCARDIOGRAM (TEE);  Surgeon: Thurmon FairMihai Croitoru, MD;  [facility];  Service: Cardiovascular;  Laterality: N/A;   HPI:  82 y.o. right handed female with h/o COPD. Presented 04/10/2016 with sudden onset of right-sided weakness and inability to speak. 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 Pt discharged to CIR 11/28, however, later that night she became dyspneic and diaphoretic. She was given albuterol nebs with some improvement. CXR with RLL infiltrate.   Assessment / Plan / Recommendation Clinical Impression  Pt has baseline coughing and throat clearing. Given thin and nectar thick liquids throat clearing and coughing persisted. However, recent MBS showed silent deep penetration of liquids that was unable to be cleared  by cued coughing. No difficulty was seen with pureed and regular solid trials. Recommend Dys 2 diet and pudding thick liquids for today. Will plan for MBS for further analysis of her swallow function for liquid recommendations.    Aspiration Risk  Moderate aspiration risk    Diet Recommendation Dysphagia 2 (Fine chop);Pudding-thick liquid   Liquid Administration via: Cup Medication Administration: Whole meds with puree Supervision: Patient able to self feed;Full supervision/cueing for compensatory strategies Compensations: Small sips/bites;Slow rate Postural Changes: Remain upright for at least 30 minutes after po intake;Seated upright at 90 degrees    Other  Recommendations Oral Care Recommendations: Oral care BID Other Recommendations: Order thickener from pharmacy;Remove water pitcher;Prohibited food (jello, ice cream, thin soups)   Follow up Recommendations Inpatient Rehab      Frequency and Duration min 2x/week  2 weeks       Prognosis Prognosis for Safe Diet Advancement: Good Barriers to Reach Goals: Cognitive deficits      Swallow Study   General HPI: 82 y.o. right handed female with h/o COPD. Presented 04/10/2016 with sudden onset of right-sided weakness and inability to speak. 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 Pt discharged to CIR 11/28, however, later that night she became dyspneic and diaphoretic. She was given albuterol nebs with some improvement. CXR with RLL infiltrate. Type of Study: Bedside Swallow Evaluation Previous Swallow Assessment: MBS (04/12/16) recommendations Dys 3, honey thick Diet Prior to this Study: NPO Temperature Spikes Noted: No Respiratory Status: Nasal cannula History of Recent Intubation: No Behavior/Cognition: Alert;Cooperative;Requires cueing Oral Cavity Assessment: Within Functional Limits  Oral  Care Completed by SLP: No Oral Cavity - Dentition: Poor condition Vision: Functional for self-feeding Self-Feeding Abilities: Able to feed self;Needs assist Patient Positioning: Upright in bed Baseline Vocal Quality: Normal Volitional Cough: Strong Volitional Swallow: Able to elicit    Oral/Motor/Sensory Function Overall Oral Motor/Sensory Function: Within functional limits   Ice Chips Ice chips: Not tested   Thin Liquid Thin Liquid: Impaired Presentation: Cup Pharyngeal  Phase Impairments: Throat Clearing - Delayed;Cough - Immediate    Nectar Thick Nectar Thick Liquid: Impaired Presentation: Cup;Self Fed Pharyngeal Phase Impairments: Throat Clearing - Delayed;Cough - Delayed   Honey Thick Honey Thick Liquid: Not tested   Puree Puree: Within functional limits Presentation: Spoon;Self Fed   Solid   GO   Solid: Within functional limits Presentation: Self Fed       Tollie EthHaleigh Ragan Zorawar Strollo, Student SLP  Caryl NeverHaleigh R Tira Lafferty 04/16/2016,3:11 PM

## 2016-04-17 ENCOUNTER — Inpatient Hospital Stay (HOSPITAL_COMMUNITY): Payer: Medicare Other

## 2016-04-17 LAB — URINE CULTURE: Culture: NO GROWTH

## 2016-04-17 LAB — BASIC METABOLIC PANEL
ANION GAP: 9 (ref 5–15)
BUN: 11 mg/dL (ref 6–20)
CALCIUM: 6.7 mg/dL — AB (ref 8.9–10.3)
CHLORIDE: 109 mmol/L (ref 101–111)
CO2: 23 mmol/L (ref 22–32)
Creatinine, Ser: 0.59 mg/dL (ref 0.44–1.00)
GFR calc Af Amer: >60 mL/min (ref >60)
GFR calc non Af Amer: >60 mL/min (ref >60)
GLUCOSE: 135 mg/dL — AB (ref 65–99)
Potassium: 3.9 mmol/L (ref 3.5–5.1)
Sodium: 141 mmol/L (ref 135–145)

## 2016-04-17 LAB — PROCALCITONIN: Procalcitonin: <0.1 ng/mL

## 2016-04-17 LAB — MAGNESIUM: Magnesium: 1.9 mg/dL (ref 1.7–2.4)

## 2016-04-17 MED ORDER — RESOURCE THICKENUP CLEAR PO POWD
Freq: Once | ORAL | Status: AC
  Administered 2016-04-17: 13:00:00 via ORAL
  Filled 2016-04-17: qty 125

## 2016-04-17 NOTE — Progress Notes (Signed)
Rehab admissions - Patient known to me from recent very short rehab stay.  Will need PT/OT re-evaluations and then can determine need to re-admit to acute inpatient rehab.  Call me for questions.  #[phone]

## 2016-04-17 NOTE — Progress Notes (Signed)
PROGRESS NOTE    Catherine ClossRegina Day  WGN:[id number] DOB: 11/16/1927 DOA: 04/15/2016 PCP: No primary care provider on file.   Brief Narrative:  82 year old WF right-handed female PMHx COPD (on 2 L O2 at home), HLD,.   Lives in [place] with her daughter, independent prior to admission as she was visiting family in [place].  Presented on April 10, 2016, with sudden onset of right-sided weakness, inability to speak.  CT and MRI showed small volume of scattered acute infarct in the left hemisphere primarily in the left temporal and occipital lobes.  No associated mass effect.  Small number of other scattered small bilateral MCA left cerebellar artery territory infarcts.The patient did receive tPA.  MRI showed possible occlusion of the left PCA-P3 inferior division.  Echocardiogram with ejection fraction, reduced to grade 1 diastolic dysfunction.  Underwent bilateral common carotid arteriograms followed by complete revascularization of occluded left MCA, left ACA, left ICA terminus.  Neurology consulted, maintained on aspirin therapy for CVA prophylaxis.  TEE completed showing no intracardiac source of embolism or shunt.  Modified barium swallow, on a mechanical soft, honey-thick liquid diet.  Subcutaneous Lovenox for DVT prophylaxis.  Physical and occupational therapy ongoing.  The patient was admitted for a comprehensive rehab program.   Subjective: 12/1 A/O 4 positive acute on chronic respiratory distress. Daughter states that patient uses O2 at home 24 hours a Day, however does not normally wheeze. Noticed a change~1 week ago after they had traveled here from [place] for visit    Assessment & Plan:   Active Problems:   Aspiration pneumonia (HCC)   Aspiration pneumonitis (HCC)   Chronic systolic CHF (congestive heart failure) (HCC)   Cerebrovascular accident (CVA) due to bilateral thrombosis of vertebral arteries (HCC)   Aspiration pneumonia/pneumonitis - Complete 5 days antibiotics -DuoNeb  QID -Solu-Medrol 40 mg daily -Physiotherapy vest QID -Flutter valve -Titrate O2 to maintain SPO2> 90% -Swallow study: Passed swallow study dysphagia 2 fluid pudding thick  Recent CVA - s/p IV TPA and IR treatments with R hemiparesthesia and dysphagia - Ongoing PT/OT - Hopefully can go back to CIR shortly - ASA 325 daily, diovan  Chronic Systolic CHF -Strict in and out since admission + 150 ml -Daily weight Filed Weights   04/15/16 0337 04/16/16 0359 04/17/16 0500  Weight: 92.1 kg (203 lb) 84.3 kg (185 lb 14.4 oz) 84.4 kg (186 lb 1.6 oz)   Hypokalemia -Potassium goal> 4 -Potassium IV 30 mEq 2  Hypocalcemia -    DVT prophylaxis: Subcutaneous heparin Code Status: Full Family Communication: Children were present to discuss plan of care Disposition Plan: CIR   Consultants:  None  Procedures/Significant Events:  11/23 admit for stroke 11/25 echocardiogram:Left ventricle: Systolic function was probably mildly reduced. -(grade 1 diastolic dysfunction). 11/27 TEE: LVEF= 45% to 50%. - hypokinesis of the inferior and inferoseptal myocardium. 11/28 discharge to CIR 11/29 transfer back to inpatient for suspected aspiration.    VENTILATOR SETTINGS:    Cultures 11/29 blood NGTD 11/30 urine pending   Antimicrobials: Zosyn 11/29 > Vancomycin 11/29 >    Devices    LINES / TUBES:      Continuous Infusions: . sodium chloride 10 mL/hr at 04/15/16 0127     Objective: Vitals:   04/17/16 0126 04/17/16 0400 04/17/16 0500 04/17/16 0810  BP:  (!) 141/65  (!) 145/87  Pulse:  80  84  Resp:  16  (!) 23  Temp:  97.8 F (36.6 C)  97.4 F (36.3 C)  TempSrc:  Oral  Oral  SpO2: 99% 99%  100%  Weight:   84.4 kg (186 lb 1.6 oz)   Height:        Intake/Output Summary (Last 24 hours) at 04/17/16 0844 Last data filed at 04/17/16 0103  Gross per 24 hour  Intake              765 ml  Output              450 ml  Net              315 ml   Filed Weights   04/15/16  0337 04/16/16 0359 04/17/16 0500  Weight: 92.1 kg (203 lb) 84.3 kg (185 lb 14.4 oz) 84.4 kg (186 lb 1.6 oz)    Examination:  General: A/O 4, positive acute On chronic respiratory distress Eyes: negative scleral hemorrhage, negative anisocoria, negative icterus ENT: Negative Runny nose, negative gingival bleeding, Neck:  Negative scars, masses, torticollis, lymphadenopathy, JVD Lungs: diffuse expiratory wheezing  Cardiovascular: Regular rate and rhythm without murmur gallop or rub normal S1 and S2 Abdomen: negative abdominal pain, nondistended, positive soft, bowel sounds, no rebound, no ascites, no appreciable mass Extremities: No significant cyanosis, clubbing, or edema bilateral lower extremities Skin: Negative rashes, lesions, ulcers Psychiatric:  Negative depression, negative anxiety, negative fatigue, negative mania  Central nervous system:  Cranial nerves II through XII intact, tongue/uvula midline, all extremities muscle strength 5/5, sensation intact throughout,  negative dysarthria, negative expressive aphasia, negative receptive aphasia.  .     Data Reviewed: Care during the described time interval was provided by me .  I have reviewed this patient's available data, including medical history, events of note, physical examination, and all test results as part of my evaluation. I have personally reviewed and interpreted all radiology studies.  CBC:  Recent Labs Lab 04/13/16 0432 04/14/16 1534 04/14/16 2250 04/15/16 0158 04/16/16 0310  WBC 11.1* 10.0 14.4* 12.3* 8.8  NEUTROABS  --   --  13.4*  --   --   HGB 9.9* 10.4* 11.7* 10.4* 9.4*  HCT 31.2* 31.4* 35.9* 32.0* 29.5*  MCV 84.6 84.2 84.7 83.8 83.8  PLT 173 183 234 187 234   Basic Metabolic Panel:  Recent Labs Lab 04/14/16 0530 04/14/16 1534 04/14/16 2250 04/15/16 0158 04/16/16 0310 04/17/16 0345  NA 139  --  139 139 142 141  K 3.2*  --  3.8 3.3* 3.4* 3.9  CL 110  --  108 109 110 109  CO2 23  --  22 20* 24  23  GLUCOSE 98  --  141* 141* 100* 135*  BUN 5*  --  10 11 9 11   CREATININE 0.50 0.70 0.76 0.72 0.53 0.59  CALCIUM 6.9*  --  7.3* 7.1* 6.5* 6.7*  MG  --   --   --  1.7  --  1.9  PHOS  --   --   --  1.3* 2.2*  --    GFR: Estimated Creatinine Clearance: 55.2 mL/min (by C-G formula based on SCr of 0.59 mg/dL). Liver Function Tests:  Recent Labs Lab 04/14/16 2250  AST 30  ALT 22  ALKPHOS 68  BILITOT 0.7  PROT 6.6  ALBUMIN 2.8*   No results for input(s): LIPASE, AMYLASE in the last 168 hours. No results for input(s): AMMONIA in the last 168 hours. Coagulation Profile: No results for input(s): INR, PROTIME in the last 168 hours. Cardiac Enzymes: No results for input(s): CKTOTAL, CKMB, CKMBINDEX, TROPONINI in the last 168  hours. BNP (last 3 results) No results for input(s): PROBNP in the last 8760 hours. HbA1C: No results for input(s): HGBA1C in the last 72 hours. CBG: No results for input(s): GLUCAP in the last 168 hours. Lipid Profile: No results for input(s): CHOL, HDL, LDLCALC, TRIG, CHOLHDL, LDLDIRECT in the last 72 hours. Thyroid Function Tests: No results for input(s): TSH, T4TOTAL, FREET4, T3FREE, THYROIDAB in the last 72 hours. Anemia Panel: No results for input(s): VITAMINB12, FOLATE, FERRITIN, TIBC, IRON, RETICCTPCT in the last 72 hours. Urine analysis:    Component Value Date/Time   COLORURINE YELLOW 04/15/2016 0947   APPEARANCEUR HAZY (A) 04/15/2016 0947   LABSPEC 1.018 04/15/2016 0947   PHURINE 5.5 04/15/2016 0947   GLUCOSEU NEGATIVE 04/15/2016 0947   HGBUR TRACE (A) 04/15/2016 0947   BILIRUBINUR NEGATIVE 04/15/2016 0947   KETONESUR NEGATIVE 04/15/2016 0947   PROTEINUR NEGATIVE 04/15/2016 0947   NITRITE POSITIVE (A) 04/15/2016 0947   LEUKOCYTESUR MODERATE (A) 04/15/2016 0947   Sepsis Labs: @LABRCNTIP (procalcitonin:4,lacticidven:4)  ) Recent Results (from the past 240 hour(s))  MRSA PCR Screening     Status: None   Collection Time: 04/09/16 11:06 PM    Result Value Ref Range Status   MRSA by PCR NEGATIVE NEGATIVE Final    Comment:        The GeneXpert MRSA Assay (FDA approved for NASAL specimens only), is one component of a comprehensive MRSA colonization surveillance program. It is not intended to diagnose MRSA infection nor to guide or monitor treatment for MRSA infections.   Culture, blood (routine x 2)     Status: None (Preliminary result)   Collection Time: 04/15/16  2:06 AM  Result Value Ref Range Status   Specimen Description BLOOD RIGHT WRIST  Final   Special Requests IN PEDIATRIC BOTTLE  Final   Culture NO GROWTH 1 Day  Final   Report Status PENDING  Incomplete  Culture, blood (routine x 2)     Status: None (Preliminary result)   Collection Time: 04/15/16  2:12 AM  Result Value Ref Range Status   Specimen Description BLOOD RIGHT HAND  Final   Special Requests BOTTLES DRAWN AEROBIC AND ANAEROBIC  Final   Culture NO GROWTH 1 Day  Final   Report Status PENDING  Incomplete         Radiology Studies: Dg Chest Port 1 View  Result Date: 04/16/2016 CLINICAL DATA:  Acute onset of shortness of breath. Initial encounter. EXAM: PORTABLE CHEST 1 VIEW COMPARISON:  Chest radiograph performed 04/14/2016 FINDINGS: Persistent right basilar airspace opacity remains concerning for pneumonia. No definite pleural effusion or pneumothorax is seen. Mild vascular congestion is noted. The cardiomediastinal silhouette is mildly enlarged. No acute osseous abnormalities are identified. IMPRESSION: 1. Persistent right basilar airspace opacity remains concerning for pneumonia. 2. Mild vascular congestion and mild cardiomegaly. Electronically Signed   By: Roanna Raider M.D.   On: 04/16/2016 06:26        Scheduled Meds: . aspirin  325 mg Oral Daily  . famotidine (PEPCID) IV  20 mg Intravenous Q12H  . heparin  5,000 Units Subcutaneous Q8H  . irbesartan  75 mg Oral Daily   And  . hydrochlorothiazide  12.5 mg Oral Daily  .  ipratropium-albuterol  3 mL Nebulization Q6H  . mouth rinse  15 mL Mouth Rinse BID  . methylPREDNISolone (SOLU-MEDROL) injection  40 mg Intravenous Daily  . piperacillin-tazobactam (ZOSYN)  IV  3.375 g Intravenous Q8H  . vancomycin  750 mg Intravenous Q24H  Continuous Infusions: . sodium chloride 10 mL/hr at 04/15/16 0127     LOS: 2 days    Time spent: 40 minutes    Catherine Day, Roselind Messier, MD [facility] Pager [phone]   If 7PM-7AM, please contact night-coverage [email or web address] Password [facility] 04/17/2016, 8:44 AM

## 2016-04-17 NOTE — Progress Notes (Signed)
Modified Barium Swallow Progress Note  Patient Details  Name: Catherine ClossRegina Febles MRN: [id number] Date of Birth: 04/27/1928  Today's Date: 04/17/2016  Modified Barium Swallow completed.  Full report located under Chart Review in the Imaging Section.  Brief recommendations include the following:  Clinical Impression  Pt's swallow function demonstrates [facility]  She presents with piecemeal oral bolusing; persisting delay in initiation (valleculae and pyriforms) -however, this is not necessarily considered disordered in the elderly population.  There may have been questionable, high penetration of liquids, but it was difficult to discern.  No aspiration observed. Pyriform sinuses appear to be asymmetric and there is mild residue remaining post-swallow. Recommend advancing diet to dysphagia 3, nectar-thick liquids for now.  Will follow for functional toleration/safety.     Swallow Evaluation Recommendations       SLP Diet Recommendations: Dysphagia 3 (Mech soft) solids;Nectar thick liquid   Liquid Administration via: Cup   Medication Administration: Whole meds with puree   Supervision: Patient able to self feed   Compensations: Small sips/bites;Slow rate       Oral Care Recommendations: Oral care BID        [name], Viraaj Vorndran Laurice 04/17/2016,11:48 AM

## 2016-04-18 LAB — COMPREHENSIVE METABOLIC PANEL
ALT: 22 U/L (ref 14–54)
AST: 31 U/L (ref 15–41)
Albumin: 2.5 g/dL — ABNORMAL LOW (ref 3.5–5.0)
Alkaline Phosphatase: 54 U/L (ref 38–126)
Anion gap: 7 (ref 5–15)
BUN: 11 mg/dL (ref 6–20)
CO2: 25 mmol/L (ref 22–32)
Calcium: 7 mg/dL — ABNORMAL LOW (ref 8.9–10.3)
Chloride: 109 mmol/L (ref 101–111)
Creatinine, Ser: 0.8 mg/dL (ref 0.44–1.00)
GFR calc Af Amer: >60 mL/min (ref >60)
GFR calc non Af Amer: >60 mL/min (ref >60)
Glucose, Bld: 109 mg/dL — ABNORMAL HIGH (ref 65–99)
Potassium: 4 mmol/L (ref 3.5–5.1)
Sodium: 141 mmol/L (ref 135–145)
Total Bilirubin: 0.5 mg/dL (ref 0.3–1.2)
Total Protein: 5.8 g/dL — ABNORMAL LOW (ref 6.5–8.1)

## 2016-04-18 LAB — MAGNESIUM: Magnesium: 1.7 mg/dL (ref 1.7–2.4)

## 2016-04-18 LAB — URINE CULTURE

## 2016-04-18 MED ORDER — MEROPENEM 1 G IV SOLR
1.0000 g | Freq: Three times a day (TID) | INTRAVENOUS | Status: AC
  Administered 2016-04-18 – 2016-04-24 (×21): 1 g via INTRAVENOUS
  Filled 2016-04-18 (×23): qty 1

## 2016-04-18 NOTE — Progress Notes (Signed)
Pharmacy Antibiotic Note  Catherine Day is a 82 y.o. female admitted on 04/15/2016 with HCAP and ESBL Ecoli UTI.  Patient is on day 4 of vanc/zosyn for HCAP and pharmacy has now been consulted for meropenem dosing. SCr trend up to 0.80 (CrCL ~54 ml/min).   Plan: - D/c vanc/zosyn - Start meropenem 1 gm IV q8h  - Monitor renal function closely   Height: 5\' 6"  (167.6 cm) Weight: 179 lb 14.4 oz (81.6 kg) IBW/kg (Calculated) : 59.3  Temp (24hrs), Avg:97.4 F (36.3 C), Min:97 F (36.1 C), Max:97.9 F (36.6 C)   Recent Labs Lab 04/13/16 0432  04/14/16 1534 04/14/16 2250 04/14/16 2255 04/15/16 0158 04/16/16 0310 04/16/16 1138 04/17/16 0345 04/18/16 0241  WBC 11.1*  --  10.0 14.4*  --  12.3* 8.8  --   --   --   CREATININE 0.67  < > 0.70 0.76  --  0.72 0.53  --  0.59 0.80  LATICACIDVEN  --   --   --   --  2.0*  --   --  1.1  --   --   < > = values in this interval not displayed.  Estimated Creatinine Clearance: 54.3 mL/min (by C-G formula based on SCr of 0.8 mg/dL).    No Known Allergies  Antimicrobials this admission: 11/29 vanc >> 12/2 11/29 zosyn >> 12/2 12/2 meropenem >>   Dose adjustments this admission: N/A  Microbiology results: 11/29 BCx: ngtd 11/30 UCx: ESBL Ecoli  11/23 MRSA PCR: NEG  Thank you for allowing pharmacy to be a part of this patient's care.  Catherine Day 04/18/2016 9:41 AM

## 2016-04-18 NOTE — Progress Notes (Addendum)
PROGRESS NOTE    Catherine Day  WUJ:[id number] DOB: 1928/03/24 DOA: 04/15/2016 PCP: No primary care provider on file.   Brief Narrative:  82 year old WF right-handed female PMHx COPD (on 2 L O2 at home 24 hours/dy), HLD,.   Lives in [place] with her daughter, independent prior to admission as she was visiting family in [place].  Presented on April 10, 2016, with sudden onset of right-sided weakness, inability to speak.  CT and MRI showed small volume of scattered acute infarct in the left hemisphere primarily in the left temporal and occipital lobes.  No associated mass effect.  Small number of other scattered small bilateral MCA left cerebellar artery territory infarcts.The patient did receive tPA.  MRI showed possible occlusion of the left PCA-P3 inferior division.  Echocardiogram with ejection fraction, reduced to grade 1 diastolic dysfunction.  Underwent bilateral common carotid arteriograms followed by complete revascularization of occluded left MCA, left ACA, left ICA terminus.  Neurology consulted, maintained on aspirin therapy for CVA prophylaxis.  TEE completed showing no intracardiac source of embolism or shunt.  Modified barium swallow, on a mechanical soft, honey-thick liquid diet.  Subcutaneous Lovenox for DVT prophylaxis.  Physical and occupational therapy ongoing.  The patient was admitted for a comprehensive rehab program.   Subjective: 12/2  A/O 4 positive acute on chronic respiratory distress.      Assessment & Plan:   Active Problems:   Aspiration pneumonia (HCC)   Aspiration pneumonitis (HCC)   Chronic systolic CHF (congestive heart failure) (HCC)   Cerebrovascular accident (CVA) due to bilateral thrombosis of vertebral arteries (HCC)   Aspiration pneumonia/pneumonitis - Complete 5 days antibiotics -DuoNeb QID -Solu-Medrol 40 mg daily -Physiotherapy vest QID -Flutter valve -Titrate O2 to maintain SPO2> 90% -Swallow study: Passed swallow study dysphagia 2  fluid pudding thick  Positive ESBL Escherichia coli UTI -12/2 start Meropenem  Recent CVA - s/p IV TPA and IR treatments with R hemiparesthesia and dysphagia - Ongoing PT/OT - Hopefully can go back to CIR shortly - ASA 325 daily, diovan  Chronic Systolic CHF -Strict in and out since admission + 150 ml -Daily weight Filed Weights   04/16/16 0359 04/17/16 0500 04/18/16 0400  Weight: 84.3 kg (185 lb 14.4 oz) 84.4 kg (186 lb 1.6 oz) 81.6 kg (179 lb 14.4 oz)   Hypokalemia -Potassium goal> 4 -Potassium IV 30 mEq 2  Hypocalcemia -    DVT prophylaxis: Subcutaneous heparin Code Status: Full Family Communication: Children were present to discuss plan of care Disposition Plan: CIR   Consultants:  None  Procedures/Significant Events:  11/23 admit for stroke 11/25 echocardiogram:Left ventricle: Systolic function was probably mildly reduced. -(grade 1 diastolic dysfunction). 11/27 TEE: LVEF= 45% to 50%. - hypokinesis of the inferior and inferoseptal myocardium. 11/28 discharge to CIR 11/29 transfer back to inpatient for suspected aspiration.    VENTILATOR SETTINGS:    Cultures 11/29 blood NGTD 11/30 urine Positive ESBL Escherichia coli   Antimicrobials: Zosyn 11/29 > Vancomycin 11/29 > Meropenem 12/2>>    Devices    LINES / TUBES:      Continuous Infusions: . sodium chloride 10 mL/hr at 04/18/16 0400     Objective: Vitals:   04/18/16 0400 04/18/16 0755 04/18/16 0800 04/18/16 0841  BP: 135/64 (!) 162/79 (!) 187/89   Pulse: 91 84 (!) 110   Resp: 20 19 (!) 25   Temp: 97.2 F (36.2 C) 97.7 F (36.5 C)    TempSrc: Axillary Oral    SpO2: 96% 99% 96% 99%  Weight: 81.6 kg (179 lb 14.4 oz)     Height:        Intake/Output Summary (Last 24 hours) at 04/18/16 0846 Last data filed at 04/18/16 0400  Gross per 24 hour  Intake              200 ml  Output              550 ml  Net             -350 ml   Filed Weights   04/16/16 0359 04/17/16 0500  04/18/16 0400  Weight: 84.3 kg (185 lb 14.4 oz) 84.4 kg (186 lb 1.6 oz) 81.6 kg (179 lb 14.4 oz)    Examination:  General: A/O 4, positive acute On chronic respiratory distress Eyes: negative scleral hemorrhage, negative anisocoria, negative icterus ENT: Negative Runny nose, negative gingival bleeding, Neck:  Negative scars, masses, torticollis, lymphadenopathy, JVD Lungs: diffuse expiratory wheezing  Cardiovascular: Regular rate and rhythm without murmur gallop or rub normal S1 and S2 Abdomen: negative abdominal pain, nondistended, positive soft, bowel sounds, no rebound, no ascites, no appreciable mass Extremities: No significant cyanosis, clubbing, or edema bilateral lower extremities Skin: Negative rashes, lesions, ulcers Psychiatric:  Negative depression, negative anxiety, negative fatigue, negative mania  Central nervous system:  Cranial nerves II through XII intact, tongue/uvula midline, all extremities muscle strength 5/5, sensation intact throughout,  negative dysarthria, negative expressive aphasia, negative receptive aphasia.  .     Data Reviewed: Care during the described time interval was provided by me .  I have reviewed this patient's available data, including medical history, events of note, physical examination, and all test results as part of my evaluation. I have personally reviewed and interpreted all radiology studies.  CBC:  Recent Labs Lab 04/13/16 0432 04/14/16 1534 04/14/16 2250 04/15/16 0158 04/16/16 0310  WBC 11.1* 10.0 14.4* 12.3* 8.8  NEUTROABS  --   --  13.4*  --   --   HGB 9.9* 10.4* 11.7* 10.4* 9.4*  HCT 31.2* 31.4* 35.9* 32.0* 29.5*  MCV 84.6 84.2 84.7 83.8 83.8  PLT 173 183 234 187 234   Basic Metabolic Panel:  Recent Labs Lab 04/14/16 2250 04/15/16 0158 04/16/16 0310 04/17/16 0345 04/18/16 0241  NA 139 139 142 141 141  K 3.8 3.3* 3.4* 3.9 4.0  CL 108 109 110 109 109  CO2 22 20* 24 23 25   GLUCOSE 141* 141* 100* 135* 109*  BUN 10  11 9 11 11   CREATININE 0.76 0.72 0.53 0.59 0.80  CALCIUM 7.3* 7.1* 6.5* 6.7* 7.0*  MG  --  1.7  --  1.9 1.7  PHOS  --  1.3* 2.2*  --   --    GFR: Estimated Creatinine Clearance: 54.3 mL/min (by C-G formula based on SCr of 0.8 mg/dL). Liver Function Tests:  Recent Labs Lab 04/14/16 2250 04/18/16 0241  AST 30 31  ALT 22 22  ALKPHOS 68 54  BILITOT 0.7 0.5  PROT 6.6 5.8*  ALBUMIN 2.8* 2.5*   No results for input(s): LIPASE, AMYLASE in the last 168 hours. No results for input(s): AMMONIA in the last 168 hours. Coagulation Profile: No results for input(s): INR, PROTIME in the last 168 hours. Cardiac Enzymes: No results for input(s): CKTOTAL, CKMB, CKMBINDEX, TROPONINI in the last 168 hours. BNP (last 3 results) No results for input(s): PROBNP in the last 8760 hours. HbA1C: No results for input(s): HGBA1C in the last 72 hours. CBG: No results for input(s):  GLUCAP in the last 168 hours. Lipid Profile: No results for input(s): CHOL, HDL, LDLCALC, TRIG, CHOLHDL, LDLDIRECT in the last 72 hours. Thyroid Function Tests: No results for input(s): TSH, T4TOTAL, FREET4, T3FREE, THYROIDAB in the last 72 hours. Anemia Panel: No results for input(s): VITAMINB12, FOLATE, FERRITIN, TIBC, IRON, RETICCTPCT in the last 72 hours. Urine analysis:    Component Value Date/Time   COLORURINE YELLOW 04/15/2016 0947   APPEARANCEUR HAZY (A) 04/15/2016 0947   LABSPEC 1.018 04/15/2016 0947   PHURINE 5.5 04/15/2016 0947   GLUCOSEU NEGATIVE 04/15/2016 0947   HGBUR TRACE (A) 04/15/2016 0947   BILIRUBINUR NEGATIVE 04/15/2016 0947   KETONESUR NEGATIVE 04/15/2016 0947   PROTEINUR NEGATIVE 04/15/2016 0947   NITRITE POSITIVE (A) 04/15/2016 0947   LEUKOCYTESUR MODERATE (A) 04/15/2016 0947   Sepsis Labs: @LABRCNTIP (procalcitonin:4,lacticidven:4)  ) Recent Results (from the past 240 hour(s))  MRSA PCR Screening     Status: None   Collection Time: 04/09/16 11:06 PM  Result Value Ref Range Status   MRSA  by PCR NEGATIVE NEGATIVE Final    Comment:        The GeneXpert MRSA Assay (FDA approved for NASAL specimens only), is one component of a comprehensive MRSA colonization surveillance program. It is not intended to diagnose MRSA infection nor to guide or monitor treatment for MRSA infections.   Culture, blood (routine x 2)     Status: None (Preliminary result)   Collection Time: 04/15/16  2:06 AM  Result Value Ref Range Status   Specimen Description BLOOD RIGHT WRIST  Final   Special Requests IN PEDIATRIC BOTTLE 3ML  Final   Culture NO GROWTH 2 DAYS  Final   Report Status PENDING  Incomplete  Culture, blood (routine x 2)     Status: None (Preliminary result)   Collection Time: 04/15/16  2:12 AM  Result Value Ref Range Status   Specimen Description BLOOD RIGHT HAND  Final   Special Requests BOTTLES DRAWN AEROBIC AND ANAEROBIC 5ML  Final   Culture NO GROWTH 2 DAYS  Final   Report Status PENDING  Incomplete  Urine culture     Status: Abnormal   Collection Time: 04/15/16  9:47 AM  Result Value Ref Range Status   Specimen Description URINE, RANDOM  Final   Special Requests ADDED [id number]  Final   Culture (A)  Final    40,000 COLONIES/mL ESCHERICHIA COLI Confirmed Extended Spectrum Beta-Lactamase Producer (ESBL)    Report Status 04/18/2016 FINAL  Final   Organism ID, Bacteria ESCHERICHIA COLI (A)  Final      Susceptibility   Escherichia coli - MIC*    AMPICILLIN >=32 RESISTANT Resistant     CEFAZOLIN >=64 RESISTANT Resistant     CEFTRIAXONE 8 RESISTANT Resistant     CIPROFLOXACIN >=4 RESISTANT Resistant     GENTAMICIN >=16 RESISTANT Resistant     IMIPENEM <=0.25 SENSITIVE Sensitive     NITROFURANTOIN <=16 SENSITIVE Sensitive     TRIMETH/SULFA >=320 RESISTANT Resistant     AMPICILLIN/SULBACTAM >=32 RESISTANT Resistant     PIP/TAZO >=128 RESISTANT Resistant     Extended ESBL POSITIVE Resistant     * 40,000 COLONIES/mL ESCHERICHIA COLI  Urine culture     Status: None    Collection Time: 04/16/16  4:42 PM  Result Value Ref Range Status   Specimen Description URINE, CLEAN CATCH  Final   Special Requests PATIENT ON FOLLOWING ZOSYN  Final   Culture NO GROWTH  Final   Report Status 04/17/2016 FINAL  Final         Radiology Studies: Dg Swallowing Func-speech Pathology  Result Date: 04/17/2016 Objective Swallowing Evaluation: Type of Study: MBS-Modified Barium Swallow Study Patient Details Name: Catherine Day MRN: [id number] Date of Birth: Jul 29, 1927 Today's Date: 04/17/2016 Time: SLP Start Time (ACUTE ONLY): 1045-SLP Stop Time (ACUTE ONLY): 1115 SLP Time Calculation (min) (ACUTE ONLY): 30 min Past Medical History: Past Medical History: Diagnosis Date . COPD (chronic obstructive pulmonary disease) (HCC)  Past Surgical History: Past Surgical History: Procedure Laterality Date . IR GENERIC HISTORICAL  04/09/2016  IR ANGIO INTRA EXTRACRAN SEL COM CAROTID INNOMINATE UNI R MOD SED 04/09/2016 Julieanne Cotton, MD [facility] . IR GENERIC HISTORICAL  04/09/2016  IR PERCUTANEOUS ART THROMBECTOMY/INFUSION INTRACRANIAL INC DIAG ANGIO 04/09/2016 Julieanne Cotton, MD [facility] . PELVIC FRACTURE SURGERY   . RADIOLOGY WITH ANESTHESIA N/A 04/09/2016  Procedure: RADIOLOGY WITH ANESTHESIA;  Surgeon: Julieanne Cotton, MD;  Location: [facility] OR;  Service: Radiology;  Laterality: N/A; . TEE WITHOUT CARDIOVERSION N/A 04/13/2016  Procedure: TRANSESOPHAGEAL ECHOCARDIOGRAM (TEE);  Surgeon: Thurmon Fair, MD;  Location: [facility] ENDOSCOPY;  Service: Cardiovascular;  Laterality: N/A; HPI: 82 y.o. right handed female with h/o COPD. Presented 04/10/2016 with sudden onset of right-sided weakness and inability to speak. 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 Pt discharged to CIR 11/28, however, later that night she became dyspneic and diaphoretic. She was given  albuterol nebs with some improvement. CXR with RLL infiltrate. Subjective: pt awake, pleasant Assessment / Plan / Recommendation CHL IP CLINICAL IMPRESSIONS 04/17/2016 Therapy Diagnosis Mild oral phase dysphagia;Mild pharyngeal phase dysphagia Clinical Impression Pt's swallow function demonstrates mild improvements since [facility] 11/26.  She presents with piecemeal oral bolusing; persisting delay in initiation (valleculae and pyriforms) -however, this is not necessarily considered disordered in the elderly population.  There may have been questionable, high penetration of liquids, but it was difficult to discern.  No aspiration. Pyriform sinuses appear to be asymmetric and there is mild residue remaining post-swallow. Recommend advancing diet to dysphagia 3, nectar-thick liquids for now.  Will follow for functional toleration/safety.   Impact on safety and function Mild aspiration risk   CHL IP TREATMENT RECOMMENDATION 04/17/2016 Treatment Recommendations Therapy as outlined in treatment plan below   Prognosis 04/17/2016 Prognosis for Safe Diet Advancement Good Barriers to Reach Goals -- Barriers/Prognosis Comment -- CHL IP DIET RECOMMENDATION 04/17/2016 SLP Diet Recommendations Dysphagia 3 (Mech soft) solids;Nectar thick liquid Liquid Administration via Cup Medication Administration Whole meds with puree Compensations Small sips/bites;Slow rate Postural Changes --   CHL IP OTHER RECOMMENDATIONS 04/17/2016 Recommended Consults -- Oral Care Recommendations Oral care BID Other Recommendations --   CHL IP FOLLOW UP RECOMMENDATIONS 04/17/2016 Follow up Recommendations Inpatient Rehab   CHL IP FREQUENCY AND DURATION 04/17/2016 Speech Therapy Frequency (ACUTE ONLY) min 2x/week Treatment Duration 2 weeks      CHL IP ORAL PHASE 04/17/2016 Oral Phase Impaired Oral - Pudding Teaspoon -- Oral - Pudding Cup -- Oral - Honey Teaspoon -- Oral - Honey Cup -- Oral - Nectar Teaspoon -- Oral - Nectar Cup -- Oral - Nectar Straw -- Oral - Thin  Teaspoon -- Oral - Thin Cup -- Oral - Thin Straw -- Oral - Puree Piecemeal swallowing Oral - Mech Soft Piecemeal swallowing Oral - Regular -- Oral - Multi-Consistency -- Oral - Pill -- Oral Phase - Comment --  CHL IP PHARYNGEAL PHASE 04/17/2016  Pharyngeal Phase Impaired Pharyngeal- Pudding Teaspoon -- Pharyngeal -- Pharyngeal- Pudding Cup -- Pharyngeal -- Pharyngeal- Honey Teaspoon -- Pharyngeal -- Pharyngeal- Honey Cup NT Pharyngeal -- Pharyngeal- Nectar Teaspoon -- Pharyngeal -- Pharyngeal- Nectar Cup Pharyngeal residue - pyriform;Delayed swallow initiation-pyriform sinuses Pharyngeal Material enters airway, remains ABOVE vocal cords then ejected out Pharyngeal- Nectar Straw -- Pharyngeal -- Pharyngeal- Thin Teaspoon -- Pharyngeal -- Pharyngeal- Thin Cup Delayed swallow initiation-pyriform sinuses;Pharyngeal residue - pyriform Pharyngeal Material enters airway, remains ABOVE vocal cords then ejected out Pharyngeal- Thin Straw -- Pharyngeal -- Pharyngeal- Puree Delayed swallow initiation-vallecula;Pharyngeal residue - pyriform Pharyngeal -- Pharyngeal- Mechanical Soft Delayed swallow initiation-vallecula;Pharyngeal residue - pyriform Pharyngeal -- Pharyngeal- Regular -- Pharyngeal -- Pharyngeal- Multi-consistency -- Pharyngeal -- Pharyngeal- Pill -- Pharyngeal -- Pharyngeal Comment --  CHL IP CERVICAL ESOPHAGEAL PHASE 04/12/2016 Cervical Esophageal Phase WFL Pudding Teaspoon -- Pudding Cup -- Honey Teaspoon -- Honey Cup -- Nectar Teaspoon -- Nectar Cup -- Nectar Straw -- Thin Teaspoon -- Thin Cup -- Thin Straw -- Puree -- Mechanical Soft -- Regular -- Multi-consistency -- Pill -- Cervical Esophageal Comment -- No flowsheet data found. Blenda Mounts Laurice 04/17/2016, 11:50 AM                   Scheduled Meds: . aspirin  325 mg Oral Daily  . famotidine (PEPCID) IV  20 mg Intravenous Q12H  . heparin  5,000 Units Subcutaneous Q8H  . irbesartan  75 mg Oral Daily   And  . hydrochlorothiazide  12.5 mg Oral  Daily  . ipratropium-albuterol  3 mL Nebulization Q6H  . mouth rinse  15 mL Mouth Rinse BID  . methylPREDNISolone (SOLU-MEDROL) injection  40 mg Intravenous Daily  . piperacillin-tazobactam (ZOSYN)  IV  3.375 g Intravenous Q8H  . vancomycin  750 mg Intravenous Q24H   Continuous Infusions: . sodium chloride 10 mL/hr at 04/18/16 0400     LOS: 3 days    Time spent: 40 minutes    Lakishia Bourassa, Roselind Messier, MD [facility] Pager [phone]   If 7PM-7AM, please contact night-coverage [email or web address] Password [facility] 04/18/2016, 8:46 AM

## 2016-04-19 DIAGNOSIS — B9629 Other Escherichia coli [E. coli] as the cause of diseases classified elsewhere: Secondary | ICD-10-CM

## 2016-04-19 DIAGNOSIS — Z1612 Extended spectrum beta lactamase (ESBL) resistance: Secondary | ICD-10-CM

## 2016-04-19 DIAGNOSIS — A498 Other bacterial infections of unspecified site: Secondary | ICD-10-CM

## 2016-04-19 DIAGNOSIS — N390 Urinary tract infection, site not specified: Secondary | ICD-10-CM

## 2016-04-19 LAB — MAGNESIUM: MAGNESIUM: 1.8 mg/dL (ref 1.7–2.4)

## 2016-04-19 MED ORDER — SODIUM CHLORIDE 0.9 % IV SOLN
1.0000 g | Freq: Once | INTRAVENOUS | Status: AC
  Administered 2016-04-19: 1 g via INTRAVENOUS
  Filled 2016-04-19: qty 10

## 2016-04-19 MED ORDER — FAMOTIDINE 20 MG PO TABS
20.0000 mg | ORAL_TABLET | Freq: Two times a day (BID) | ORAL | Status: DC
  Administered 2016-04-19 – 2016-04-23 (×7): 20 mg via ORAL
  Filled 2016-04-19 (×9): qty 1

## 2016-04-19 NOTE — Progress Notes (Signed)
PROGRESS NOTE    Catherine Day  RUE:[id number] DOB: 02/23/1928 DOA: 04/15/2016 PCP: No primary care provider on file.   Brief Narrative:  82 year old WF right-handed female PMHx COPD (on 2 L O2 at home 24 hours/dy), HLD,.   Lives in [place] with her daughter, independent prior to admission as she was visiting family in [place].  Presented on April 10, 2016, with sudden onset of right-sided weakness, inability to speak.  CT and MRI showed small volume of scattered acute infarct in the left hemisphere primarily in the left temporal and occipital lobes.  No associated mass effect.  Small number of other scattered small bilateral MCA left cerebellar artery territory infarcts.The patient did receive tPA.  MRI showed possible occlusion of the left PCA-P3 inferior division.  Echocardiogram with ejection fraction, reduced to grade 1 diastolic dysfunction.  Underwent bilateral common carotid arteriograms followed by complete revascularization of occluded left MCA, left ACA, left ICA terminus.  Neurology consulted, maintained on aspirin therapy for CVA prophylaxis.  TEE completed showing no intracardiac source of embolism or shunt.  Modified barium swallow, on a mechanical soft, honey-thick liquid diet.  Subcutaneous Lovenox for DVT prophylaxis.  Physical and occupational therapy ongoing.  The patient was admitted for a comprehensive rehab program.   Subjective: 12/3   A/O 4 positive acute on chronic respiratory distress.      Assessment & Plan:   Active Problems:   Aspiration pneumonia (HCC)   Aspiration pneumonitis (HCC)   Chronic systolic CHF (congestive heart failure) (HCC)   Cerebrovascular accident (CVA) due to bilateral thrombosis of vertebral arteries (HCC)   Aspiration pneumonia/pneumonitis - Complete 5 days antibiotics -DuoNeb QID -Solu-Medrol 40 mg daily -Physiotherapy vest QID -Flutter valve -Titrate O2 to maintain SPO2> 90% -Swallow study: Passed swallow study dysphagia 2  fluid pudding thick -PCXR on 12/4 check interval change  Positive ESBL Escherichia coli UTI -12/2 start Meropenem  Recent CVA - s/p IV TPA and IR treatments with R hemiparesthesia and dysphagia - Ongoing PT/OT - Hopefully can go back to CIR shortly - ASA 325 daily, diovan -Out of bed to chair q shift  Chronic Systolic CHF -Strict in and out since admission -1.2 L -Daily weight Filed Weights   04/17/16 0500 04/18/16 0400 04/19/16 0412  Weight: 84.4 kg (186 lb 1.6 oz) 81.6 kg (179 lb 14.4 oz) 81.1 kg (178 lb 11.2 oz)   Hypokalemia -Potassium goal> 4   Hypocalcemia -Corrected calcium= 8.3 low  -Calcium gluconate 2 g     DVT prophylaxis: Subcutaneous heparin Code Status: Full Family Communication: None Disposition Plan: CIR   Consultants:  None  Procedures/Significant Events:  11/23 admit for stroke 11/25 echocardiogram:Left ventricle: Systolic function was probably mildly reduced. -(grade 1 diastolic dysfunction). 11/27 TEE: LVEF= 45% to 50%. - hypokinesis of the inferior and inferoseptal myocardium. 11/28 discharge to CIR 11/29 transfer back to inpatient for suspected aspiration.    VENTILATOR SETTINGS:    Cultures 11/29 blood NGTD 11/30 urine Positive ESBL Escherichia coli   Antimicrobials: Zosyn 11/29 >12/2 Vancomycin 11/29 > 12/2 Meropenem 12/2>>    Devices    LINES / TUBES:      Continuous Infusions: . sodium chloride 10 mL/hr at 04/19/16 0600     Objective: Vitals:   04/19/16 0400 04/19/16 0412 04/19/16 0728 04/19/16 0840  BP: (!) 149/73 (!) 149/73 113/60   Pulse: 92 85 87   Resp: (!) 21 19 18    Temp:   97.4 F (36.3 C)   TempSrc:   Oral  SpO2: 96% 100% 100% 97%  Weight:  81.1 kg (178 lb 11.2 oz)    Height:        Intake/Output Summary (Last 24 hours) at 04/19/16 0846 Last data filed at 04/19/16 0600  Gross per 24 hour  Intake              820 ml  Output             1450 ml  Net             -630 ml   Filed Weights     04/17/16 0500 04/18/16 0400 04/19/16 0412  Weight: 84.4 kg (186 lb 1.6 oz) 81.6 kg (179 lb 14.4 oz) 81.1 kg (178 lb 11.2 oz)    Examination:  General: A/O 4, positive acute On chronic respiratory distress Eyes: negative scleral hemorrhage, negative anisocoria, negative icterus ENT: Negative Runny nose, negative gingival bleeding, Neck:  Negative scars, masses, torticollis, lymphadenopathy, JVD Lungs: diffuse expiratory wheezing (improving)  Cardiovascular: Regular rate and rhythm without murmur gallop or rub normal S1 and S2 Abdomen: negative abdominal pain, nondistended, positive soft, bowel sounds, no rebound, no ascites, no appreciable mass Extremities: No significant cyanosis, clubbing, or edema bilateral lower extremities Skin: Negative rashes, lesions, ulcers Psychiatric:  Negative depression, negative anxiety, negative fatigue, negative mania  Central nervous system:  Cranial nerves II through XII intact, tongue/uvula midline, all extremities muscle strength 5/5, sensation intact throughout,  negative dysarthria, negative expressive aphasia, negative receptive aphasia.  .     Data Reviewed: Care during the described time interval was provided by me .  I have reviewed this patient's available data, including medical history, events of note, physical examination, and all test results as part of my evaluation. I have personally reviewed and interpreted all radiology studies.  CBC:  Recent Labs Lab 04/13/16 0432 04/14/16 1534 04/14/16 2250 04/15/16 0158 04/16/16 0310  WBC 11.1* 10.0 14.4* 12.3* 8.8  NEUTROABS  --   --  13.4*  --   --   HGB 9.9* 10.4* 11.7* 10.4* 9.4*  HCT 31.2* 31.4* 35.9* 32.0* 29.5*  MCV 84.6 84.2 84.7 83.8 83.8  PLT 173 183 234 187 234   Basic Metabolic Panel:  Recent Labs Lab 04/14/16 2250 04/15/16 0158 04/16/16 0310 04/17/16 0345 04/18/16 0241 04/19/16 0204  NA 139 139 142 141 141  --   K 3.8 3.3* 3.4* 3.9 4.0  --   CL 108 109 110 109  109  --   CO2 22 20* 24 23 25   --   GLUCOSE 141* 141* 100* 135* 109*  --   BUN 10 11 9 11 11   --   CREATININE 0.76 0.72 0.53 0.59 0.80  --   CALCIUM 7.3* 7.1* 6.5* 6.7* 7.0*  --   MG  --  1.7  --  1.9 1.7 1.8  PHOS  --  1.3* 2.2*  --   --   --    GFR: Estimated Creatinine Clearance: 54.2 mL/min (by C-G formula based on SCr of 0.8 mg/dL). Liver Function Tests:  Recent Labs Lab 04/14/16 2250 04/18/16 0241  AST 30 31  ALT 22 22  ALKPHOS 68 54  BILITOT 0.7 0.5  PROT 6.6 5.8*  ALBUMIN 2.8* 2.5*   No results for input(s): LIPASE, AMYLASE in the last 168 hours. No results for input(s): AMMONIA in the last 168 hours. Coagulation Profile: No results for input(s): INR, PROTIME in the last 168 hours. Cardiac Enzymes: No results for input(s): CKTOTAL, CKMB,  CKMBINDEX, TROPONINI in the last 168 hours. BNP (last 3 results) No results for input(s): PROBNP in the last 8760 hours. HbA1C: No results for input(s): HGBA1C in the last 72 hours. CBG: No results for input(s): GLUCAP in the last 168 hours. Lipid Profile: No results for input(s): CHOL, HDL, LDLCALC, TRIG, CHOLHDL, LDLDIRECT in the last 72 hours. Thyroid Function Tests: No results for input(s): TSH, T4TOTAL, FREET4, T3FREE, THYROIDAB in the last 72 hours. Anemia Panel: No results for input(s): VITAMINB12, FOLATE, FERRITIN, TIBC, IRON, RETICCTPCT in the last 72 hours. Urine analysis:    Component Value Date/Time   COLORURINE YELLOW 04/15/2016 0947   APPEARANCEUR HAZY (A) 04/15/2016 0947   LABSPEC 1.018 04/15/2016 0947   PHURINE 5.5 04/15/2016 0947   GLUCOSEU NEGATIVE 04/15/2016 0947   HGBUR TRACE (A) 04/15/2016 0947   BILIRUBINUR NEGATIVE 04/15/2016 0947   KETONESUR NEGATIVE 04/15/2016 0947   PROTEINUR NEGATIVE 04/15/2016 0947   NITRITE POSITIVE (A) 04/15/2016 0947   LEUKOCYTESUR MODERATE (A) 04/15/2016 0947   Sepsis Labs: @LABRCNTIP (procalcitonin:4,lacticidven:4)  ) Recent Results (from the past 240 hour(s))  MRSA  PCR Screening     Status: None   Collection Time: 04/09/16 11:06 PM  Result Value Ref Range Status   MRSA by PCR NEGATIVE NEGATIVE Final    Comment:        The GeneXpert MRSA Assay (FDA approved for NASAL specimens only), is one component of a comprehensive MRSA colonization surveillance program. It is not intended to diagnose MRSA infection nor to guide or monitor treatment for MRSA infections.   Culture, blood (routine x 2)     Status: None (Preliminary result)   Collection Time: 04/15/16  2:06 AM  Result Value Ref Range Status   Specimen Description BLOOD RIGHT WRIST  Final   Special Requests IN PEDIATRIC BOTTLE  Final   Culture NO GROWTH 3 DAYS  Final   Report Status PENDING  Incomplete  Culture, blood (routine x 2)     Status: None (Preliminary result)   Collection Time: 04/15/16  2:12 AM  Result Value Ref Range Status   Specimen Description BLOOD RIGHT HAND  Final   Special Requests BOTTLES DRAWN AEROBIC AND ANAEROBIC  Final   Culture NO GROWTH 3 DAYS  Final   Report Status PENDING  Incomplete  Urine culture     Status: Abnormal   Collection Time: 04/15/16  9:47 AM  Result Value Ref Range Status   Specimen Description URINE, RANDOM  Final   Special Requests ADDED [id number]  Final   Culture (A)  Final    40,000 COLONIES/mL ESCHERICHIA COLI Confirmed Extended Spectrum Beta-Lactamase Producer (ESBL)    Report Status 04/18/2016 FINAL  Final   Organism ID, Bacteria ESCHERICHIA COLI (A)  Final      Susceptibility   Escherichia coli - MIC*    AMPICILLIN >=32 RESISTANT Resistant     CEFAZOLIN >=64 RESISTANT Resistant     CEFTRIAXONE 8 RESISTANT Resistant     CIPROFLOXACIN >=4 RESISTANT Resistant     GENTAMICIN >=16 RESISTANT Resistant     IMIPENEM <=0.25 SENSITIVE Sensitive     NITROFURANTOIN <=16 SENSITIVE Sensitive     TRIMETH/SULFA >=320 RESISTANT Resistant     AMPICILLIN/SULBACTAM >=32 RESISTANT Resistant     PIP/TAZO >=128 RESISTANT Resistant      Extended ESBL POSITIVE Resistant     * 40,000 COLONIES/mL ESCHERICHIA COLI  Urine culture     Status: None   Collection Time: 04/16/16  4:42 PM  Result Value Ref Range Status   Specimen Description URINE, CLEAN CATCH  Final   Special Requests PATIENT ON FOLLOWING ZOSYN  Final   Culture NO GROWTH  Final   Report Status 04/17/2016 FINAL  Final         Radiology Studies: Dg Swallowing Func-speech Pathology  Result Date: 04/17/2016 Objective Swallowing Evaluation: Type of Study: MBS-Modified Barium Swallow Study Patient Details Name: Kameah Rawl MRN: [id number] Date of Birth: 10-13-1927 Today's Date: 04/17/2016 Time: SLP Start Time (ACUTE ONLY): 1045-SLP Stop Time (ACUTE ONLY): 1115 SLP Time Calculation (min) (ACUTE ONLY): 30 min Past Medical History: Past Medical History: Diagnosis Date . COPD (chronic obstructive pulmonary disease) (HCC)  Past Surgical History: Past Surgical History: Procedure Laterality Date . IR GENERIC HISTORICAL  04/09/2016  IR ANGIO INTRA EXTRACRAN SEL COM CAROTID INNOMINATE UNI R MOD SED 04/09/2016 Julieanne Cotton, MD [facility] . IR GENERIC HISTORICAL  04/09/2016  IR PERCUTANEOUS ART THROMBECTOMY/INFUSION INTRACRANIAL INC DIAG ANGIO 04/09/2016 Julieanne Cotton, MD [facility] . PELVIC FRACTURE SURGERY   . RADIOLOGY WITH ANESTHESIA N/A 04/09/2016  Procedure: RADIOLOGY WITH ANESTHESIA;  Surgeon: Julieanne Cotton, MD;  Location: [facility] OR;  Service: Radiology;  Laterality: N/A; . TEE WITHOUT CARDIOVERSION N/A 04/13/2016  Procedure: TRANSESOPHAGEAL ECHOCARDIOGRAM (TEE);  Surgeon: Thurmon Fair, MD;  Location: [facility];  Service: Cardiovascular;  Laterality: N/A; HPI: 82 y.o. right handed female with h/o COPD. Presented 04/10/2016 with sudden onset of right-sided weakness and inability to speak. 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  Pt discharged to CIR 11/28, however, later that night she became dyspneic and diaphoretic. She was given albuterol nebs with some improvement. CXR with RLL infiltrate. Subjective: pt awake, pleasant Assessment / Plan / Recommendation CHL IP CLINICAL IMPRESSIONS 04/17/2016 Therapy Diagnosis Mild oral phase dysphagia;Mild pharyngeal phase dysphagia Clinical Impression Pt's swallow function demonstrates mild improvements since [facility] 11/26.  She presents with piecemeal oral bolusing; persisting delay in initiation (valleculae and pyriforms) -however, this is not necessarily considered disordered in the elderly population.  There may have been questionable, high penetration of liquids, but it was difficult to discern.  No aspiration. Pyriform sinuses appear to be asymmetric and there is mild residue remaining post-swallow. Recommend advancing diet to dysphagia 3, nectar-thick liquids for now.  Will follow for functional toleration/safety.   Impact on safety and function Mild aspiration risk   CHL IP TREATMENT RECOMMENDATION 04/17/2016 Treatment Recommendations Therapy as outlined in treatment plan below   Prognosis 04/17/2016 Prognosis for Safe Diet Advancement Good Barriers to Reach Goals -- Barriers/Prognosis Comment -- CHL IP DIET RECOMMENDATION 04/17/2016 SLP Diet Recommendations Dysphagia 3 (Mech soft) solids;Nectar thick liquid Liquid Administration via Cup Medication Administration Whole meds with puree Compensations Small sips/bites;Slow rate Postural Changes --   CHL IP OTHER RECOMMENDATIONS 04/17/2016 Recommended Consults -- Oral Care Recommendations Oral care BID Other Recommendations --   CHL IP FOLLOW UP RECOMMENDATIONS 04/17/2016 Follow up Recommendations Inpatient Rehab   CHL IP FREQUENCY AND DURATION 04/17/2016 Speech Therapy Frequency (ACUTE ONLY) min 2x/week Treatment Duration 2 weeks      CHL IP ORAL PHASE 04/17/2016 Oral Phase Impaired Oral - Pudding Teaspoon -- Oral - Pudding Cup -- Oral - Honey Teaspoon --  Oral - Honey Cup -- Oral - Nectar Teaspoon -- Oral - Nectar Cup -- Oral - Nectar Straw -- Oral - Thin Teaspoon -- Oral - Thin Cup -- Oral -  Thin Straw -- Oral - Puree Piecemeal swallowing Oral - Mech Soft Piecemeal swallowing Oral - Regular -- Oral - Multi-Consistency -- Oral - Pill -- Oral Phase - Comment --  CHL IP PHARYNGEAL PHASE 04/17/2016 Pharyngeal Phase Impaired Pharyngeal- Pudding Teaspoon -- Pharyngeal -- Pharyngeal- Pudding Cup -- Pharyngeal -- Pharyngeal- Honey Teaspoon -- Pharyngeal -- Pharyngeal- Honey Cup NT Pharyngeal -- Pharyngeal- Nectar Teaspoon -- Pharyngeal -- Pharyngeal- Nectar Cup Pharyngeal residue - pyriform;Delayed swallow initiation-pyriform sinuses Pharyngeal Material enters airway, remains ABOVE vocal cords then ejected out Pharyngeal- Nectar Straw -- Pharyngeal -- Pharyngeal- Thin Teaspoon -- Pharyngeal -- Pharyngeal- Thin Cup Delayed swallow initiation-pyriform sinuses;Pharyngeal residue - pyriform Pharyngeal Material enters airway, remains ABOVE vocal cords then ejected out Pharyngeal- Thin Straw -- Pharyngeal -- Pharyngeal- Puree Delayed swallow initiation-vallecula;Pharyngeal residue - pyriform Pharyngeal -- Pharyngeal- Mechanical Soft Delayed swallow initiation-vallecula;Pharyngeal residue - pyriform Pharyngeal -- Pharyngeal- Regular -- Pharyngeal -- Pharyngeal- Multi-consistency -- Pharyngeal -- Pharyngeal- Pill -- Pharyngeal -- Pharyngeal Comment --  CHL IP CERVICAL ESOPHAGEAL PHASE 04/12/2016 Cervical Esophageal Phase WFL Pudding Teaspoon -- Pudding Cup -- Honey Teaspoon -- Honey Cup -- Nectar Teaspoon -- Nectar Cup -- Nectar Straw -- Thin Teaspoon -- Thin Cup -- Thin Straw -- Puree -- Mechanical Soft -- Regular -- Multi-consistency -- Pill -- Cervical Esophageal Comment -- No flowsheet data found. [name], Amanda Laurice 04/17/2016, 11:50 AM                   Scheduled Meds: . aspirin  325 mg Oral Daily  . famotidine (PEPCID) IV  20 mg Intravenous Q12H  . heparin  5,000  Units Subcutaneous Q8H  . irbesartan  75 mg Oral Daily   And  . hydrochlorothiazide  12.5 mg Oral Daily  . ipratropium-albuterol  3 mL Nebulization Q6H  . mouth rinse  15 mL Mouth Rinse BID  . meropenem (MERREM) IV  1 g Intravenous Q8H  . methylPREDNISolone (SOLU-MEDROL) injection  40 mg Intravenous Daily   Continuous Infusions: . sodium chloride 10 mL/hr at 04/19/16 0600     LOS: 4 days    Time spent: 40 minutes    Enslee Bibbins, Roselind MessierURTIS J, MD [facility] Pager [phone]   If 7PM-7AM, please contact night-coverage [email or web address] Password [email or web address] 04/19/2016, 8:46 AM

## 2016-04-19 NOTE — Progress Notes (Addendum)
14 [place] F[place] Cath placed via Leia AlfMariah,Tara, Kristie, Jody and Dewayne HatchAnn. In and Out caths tried 2 times to follow protocal without success. Foley's tried 2 times and on the third attempt successful entering into bladder. 600ml of clear yellow urine excreted. Patient resting better and respirations sounding better. Stat lock applied to right inner thigh. Foley bag dated and signed.  Will continue to monitor output.

## 2016-04-19 NOTE — Progress Notes (Signed)
Distal BLE have a deep purple color (left worse than right) with unpalpable pulses and were cool to touch.  MD notified at this time.  By the time doppler was obtained to procure pedal pulses, color of bilateral feet had improved.  Unable to palpate pulses, but able to doppler Bilateral dorsalis pedal and posterior tibial pedal pulses without difficulty.

## 2016-04-19 NOTE — Progress Notes (Signed)
Patient continues to request bedpan and voids very small amounts. Bladder scan reads >532. MD notified and waiting for orders. Will continue to monitor closely.

## 2016-04-20 ENCOUNTER — Inpatient Hospital Stay (HOSPITAL_COMMUNITY): Payer: Medicare Other

## 2016-04-20 LAB — BASIC METABOLIC PANEL
Anion gap: 7 (ref 5–15)
BUN: 11 mg/dL (ref 6–20)
CO2: 29 mmol/L (ref 22–32)
CREATININE: 0.8 mg/dL (ref 0.44–1.00)
Calcium: 7.8 mg/dL — ABNORMAL LOW (ref 8.9–10.3)
Chloride: 101 mmol/L (ref 101–111)
GFR calc Af Amer: >60 mL/min (ref >60)
Glucose, Bld: 141 mg/dL — ABNORMAL HIGH (ref 65–99)
POTASSIUM: 4.4 mmol/L (ref 3.5–5.1)
SODIUM: 137 mmol/L (ref 135–145)

## 2016-04-20 LAB — CULTURE, BLOOD (ROUTINE X 2)
CULTURE: NO GROWTH
Culture: NO GROWTH

## 2016-04-20 LAB — MAGNESIUM: MAGNESIUM: 1.9 mg/dL (ref 1.7–2.4)

## 2016-04-20 MED ORDER — IPRATROPIUM-ALBUTEROL 0.5-2.5 (3) MG/3ML IN SOLN
3.0000 mL | Freq: Four times a day (QID) | RESPIRATORY_TRACT | Status: DC
  Administered 2016-04-20 – 2016-04-21 (×3): 3 mL via RESPIRATORY_TRACT
  Filled 2016-04-20 (×5): qty 3

## 2016-04-20 NOTE — Progress Notes (Signed)
Report called to Dollar GeneralKelly RN. All personal items accounted for and will transport in the bed.

## 2016-04-20 NOTE — Progress Notes (Signed)
Speech Language Pathology Treatment: Dysphagia  Patient Details Name: Catherine ClossRegina Day MRN: [id number] DOB: 01/11/1928 Today's Date: 04/20/2016 Time: [id number] SLP Time Calculation (min) (ACUTE ONLY): 10 min  Assessment / Plan / Recommendation Clinical Impression  F/u after 12/1 MBS.  Today's CXR shows: slight improvement of right base infiltrate. A component of right base bronchiectasis cannot be excluded. Tiny left pleural effusion. Pt with bilateral expiratory wheezing today.  Observation of consumption of breakfast tray reveals no overt s/s of aspiration. Pt has been drinking only nectar-liquids, which was a conservative recommendation based on clinical findings, despite surprisingly adequate swallow function per MBS. Follows basic precautions with min assist.  Respiratory/swallow reciprocity appears to be adequate.  Recommend continuing dysphagia 3 diet, nectar-thick liquids; please contact SLP services if there are concerns for aspiration [phone] Our services will continue to follow.    HPI HPI: 82 y.o. right handed female with h/o COPD. Presented 04/10/2016 with sudden onset of right-sided weakness and inability to speak. 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 Pt discharged to CIR 11/28, however, later that night she became dyspneic and diaphoretic. She was given albuterol nebs with some improvement. CXR with RLL infiltrate.      SLP Plan  Continue with current plan of care     Recommendations  Diet recommendations: Dysphagia 3 (mechanical soft);Nectar-thick liquid Liquids provided via: Cup Medication Administration: Whole meds with puree Supervision: Staff to assist with self feeding Compensations: Small sips/bites;Slow rate Postural Changes and/or Swallow Maneuvers: Seated upright 90 degrees                Oral Care Recommendations: Oral  care BID Plan: Continue with current plan of care       GO              Catherine Day L. Catherine Day, [place]MA CCC/SLP Pager [phone]   Catherine MountsCouture, Catherine Day 04/20/2016, 11:08 AM

## 2016-04-20 NOTE — Progress Notes (Signed)
Patient being transferred to 5w 29. Attempted to call report.

## 2016-04-20 NOTE — Progress Notes (Signed)
[name] TEAM 1 - Stepdown/ICU TEAM  Catherine ClossRegina Day  WJX:[id number] DOB: 08/12/1927 DOA: 04/15/2016 PCP: No primary care provider on file.    Brief Narrative:  82 yo right-handed female from [place] w/ a Hx of COPD (on 2 L O2) and HLD who presented on April 10, 2016 with the sudden onset of right-sided weakness and an inability to speak. CT and MRI showed small volume scattered acute infarcts in the left hemisphere primarily in the left temporal and occipital lobes. No associated mass effect. Small number of other scattered small bilateral MCA left cerebellar artery territory infarcts.  The patient did receive tPA. MRI showed possible occlusion of the left PCA-P3 inferior division. Underwent bilateral common carotid arteriograms followed by complete revascularization of occluded left MCA, left ACA, left ICA terminus. Neurology consulted, maintained on aspirin therapy for CVA prophylaxis.  TEE noted no intracardiac source of embolism or shunt. She was transferred to CIR, but while there she suffered worsening hypoxia and on 11/29 required transfer back to the acute hospital for aspiration pneumonitis.    Subjective: The patient is resting comfortably in bed.  She denies fevers chills nausea vomiting or chest pain.  She is wheezing somewhat but states it is not bothering her.  Assessment & Plan:  Aspiration pneumonia / pneumonitis completed 5 days antibiotics - cleared for D3 diet w/ nectar liquids by SLP - WBC normalized and patient afebrile  Positive ESBL Escherichia coli UTI Meropenem dosing continues   Recent CVA s/p IV TPA and IR treatments with R hemiparesthesia and dysphagia - ongoing PT/OT - hopefully can go back to CIR shortly  Chronic Systolic CHF No evidence of severe volume overload at present  [facility]   04/18/16 0400 04/19/16 0412 04/20/16 0423  Weight: 81.6 kg (179 lb 14.4 oz) 81.1 kg (178 lb 11.2 oz) 79.7 kg (175 lb 9.6 oz)    Hypokalemia Replaced to  goal  DVT prophylaxis: SQ heparin  Code Status: FULL CODE Family Communication: Spoke with daughter at bedside Disposition Plan: Transfer to medical bed - continue PT/OT - hopeful for eventual return to CIR  Consultants:  none  Procedures: none  Antimicrobials:  Zosyn 11/29 >12/2 Vancomycin 11/29 > 12/2 Meropenem 12/2 >  Objective: Blood pressure (!) 144/70, pulse 78, temperature 98.2 F (36.8 C), temperature source Oral, resp. rate (!) 26, height 5\' 6"  (1.676 m), weight 79.7 kg (175 lb 9.6 oz), SpO2 100 %.  Intake/Output Summary (Last 24 hours) at 04/20/16 1143 Last data filed at 04/20/16 [id number]  Gross per 24 hour  Intake             1120 ml  Output             1175 ml  Net              -55 ml   Filed Weights   04/18/16 0400 04/19/16 0412 04/20/16 0423  Weight: 81.6 kg (179 lb 14.4 oz) 81.1 kg (178 lb 11.2 oz) 79.7 kg (175 lb 9.6 oz)    Examination: General: No acute respiratory distress Lungs: Diffuse expiratory wheeze with upper airway sounds without focal crackles Cardiovascular: Regular rate and rhythm without murmur gallop or rub  Abdomen: Nontender, nondistended, soft, bowel sounds positive, no rebound, no ascites, no appreciable mass Extremities: No significant cyanosis or clubbing - 1+ edema bilateral lower extremities  CBC:  Recent Labs Lab 04/14/16 1534 04/14/16 2250 04/15/16 0158 04/16/16 0310  WBC 10.0 14.4* 12.3* 8.8  NEUTROABS  --  13.4*  --   --  HGB 10.4* 11.7* 10.4* 9.4*  HCT 31.4* 35.9* 32.0* 29.5*  MCV 84.2 84.7 83.8 83.8  PLT 183 234 187 234   Basic Metabolic Panel:  Recent Labs Lab 04/15/16 0158 04/16/16 0310 04/17/16 0345 04/18/16 0241 04/19/16 0204 04/20/16 0200  NA 139 142 141 141  --  137  K 3.3* 3.4* 3.9 4.0  --  4.4  CL 109 110 109 109  --  101  CO2 20* 24 23 25   --  29  GLUCOSE 141* 100* 135* 109*  --  141*  BUN 11 9 11 11   --  11  CREATININE 0.72 0.53 0.59 0.80  --  0.80  CALCIUM 7.1* 6.5* 6.7* 7.0*  --  7.8*  MG  1.7  --  1.9 1.7 1.8 1.9  PHOS 1.3* 2.2*  --   --   --   --    GFR: Estimated Creatinine Clearance: 53.8 mL/min (by C-G formula based on SCr of 0.8 mg/dL).  Liver Function Tests:  Recent Labs Lab 04/14/16 2250 04/18/16 0241  AST 30 31  ALT 22 22  ALKPHOS 68 54  BILITOT 0.7 0.5  PROT 6.6 5.8*  ALBUMIN 2.8* 2.5*    HbA1C: Hgb A1c MFr Bld  Date/Time Value Ref Range Status  04/10/2016 06:37 AM 5.8 (H) 4.8 - 5.6 % Final    Comment:    (NOTE)         Pre-diabetes: 5.7 - 6.4         Diabetes: >6.4         Glycemic control for adults with diabetes: <7.0      Recent Results (from the past 240 hour(s))  Culture, blood (routine x 2)     Status: None (Preliminary result)   Collection Time: 04/15/16  2:06 AM  Result Value Ref Range Status   Specimen Description BLOOD RIGHT WRIST  Final   Special Requests IN PEDIATRIC BOTTLE  Final   Culture NO GROWTH 4 DAYS  Final   Report Status PENDING  Incomplete  Culture, blood (routine x 2)     Status: None (Preliminary result)   Collection Time: 04/15/16  2:12 AM  Result Value Ref Range Status   Specimen Description BLOOD RIGHT HAND  Final   Special Requests BOTTLES DRAWN AEROBIC AND ANAEROBIC  Final   Culture NO GROWTH 4 DAYS  Final   Report Status PENDING  Incomplete  Urine culture     Status: Abnormal   Collection Time: 04/15/16  9:47 AM  Result Value Ref Range Status   Specimen Description URINE, RANDOM  Final   Special Requests ADDED [id number]  Final   Culture (A)  Final    40,000 COLONIES/mL ESCHERICHIA COLI Confirmed Extended Spectrum Beta-Lactamase Producer (ESBL)    Report Status 04/18/2016 FINAL  Final   Organism ID, Bacteria ESCHERICHIA COLI (A)  Final      Susceptibility   Escherichia coli - MIC*    AMPICILLIN >=32 RESISTANT Resistant     CEFAZOLIN >=64 RESISTANT Resistant     CEFTRIAXONE 8 RESISTANT Resistant     CIPROFLOXACIN >=4 RESISTANT Resistant     GENTAMICIN >=16 RESISTANT Resistant     IMIPENEM  <=0.25 SENSITIVE Sensitive     NITROFURANTOIN <=16 SENSITIVE Sensitive     TRIMETH/SULFA >=320 RESISTANT Resistant     AMPICILLIN/SULBACTAM >=32 RESISTANT Resistant     PIP/TAZO >=128 RESISTANT Resistant     Extended ESBL POSITIVE Resistant     * 40,000 COLONIES/mL ESCHERICHIA  COLI  Urine culture     Status: None   Collection Time: 04/16/16  4:42 PM  Result Value Ref Range Status   Specimen Description URINE, CLEAN CATCH  Final   Special Requests PATIENT ON FOLLOWING ZOSYN  Final   Culture NO GROWTH  Final   Report Status 04/17/2016 FINAL  Final     Scheduled Meds: . aspirin  325 mg Oral Daily  . famotidine  20 mg Oral BID  . heparin  5,000 Units Subcutaneous Q8H  . irbesartan  75 mg Oral Daily   And  . hydrochlorothiazide  12.5 mg Oral Daily  . ipratropium-albuterol  3 mL Nebulization Q6H  . mouth rinse  15 mL Mouth Rinse BID  . meropenem (MERREM) IV  1 g Intravenous Q8H  . methylPREDNISolone (SOLU-MEDROL) injection  40 mg Intravenous Daily     LOS: 5 days   Lonia BloodJeffrey T. Rayleen Wyrick, MD [facility] Office  [phone] Pager - Text Page per Loretha StaplerAmion as per below:  On-Call/Text Page:      [email or web address]      password [email or web address]  If 7PM-7AM, please contact night-coverage [email or web address] Password [email or web address] 04/20/2016, 11:43 AM

## 2016-04-21 ENCOUNTER — Inpatient Hospital Stay (HOSPITAL_COMMUNITY): Payer: Medicare Other

## 2016-04-21 LAB — COMPREHENSIVE METABOLIC PANEL
ALBUMIN: 2.6 g/dL — AB (ref 3.5–5.0)
ALK PHOS: 50 U/L (ref 38–126)
ALT: 24 U/L (ref 14–54)
AST: 29 U/L (ref 15–41)
Anion gap: 8 (ref 5–15)
BILIRUBIN TOTAL: 0.6 mg/dL (ref 0.3–1.2)
BUN: 21 mg/dL — AB (ref 6–20)
CO2: 32 mmol/L (ref 22–32)
CREATININE: 0.71 mg/dL (ref 0.44–1.00)
Calcium: 8.7 mg/dL — ABNORMAL LOW (ref 8.9–10.3)
Chloride: 97 mmol/L — ABNORMAL LOW (ref 101–111)
GFR calc Af Amer: >60 mL/min (ref >60)
GLUCOSE: 104 mg/dL — AB (ref 65–99)
Potassium: 4.2 mmol/L (ref 3.5–5.1)
Sodium: 137 mmol/L (ref 135–145)
TOTAL PROTEIN: 6 g/dL — AB (ref 6.5–8.1)

## 2016-04-21 LAB — CBC
HCT: 35.5 % — ABNORMAL LOW (ref 36.0–46.0)
Hemoglobin: 10.8 g/dL — ABNORMAL LOW (ref 12.0–15.0)
MCH: 26.7 pg (ref 26.0–34.0)
MCHC: 30.4 g/dL (ref 30.0–36.0)
MCV: 87.7 fL (ref 78.0–100.0)
PLATELETS: 366 10*3/uL (ref 150–400)
RBC: 4.05 MIL/uL (ref 3.87–5.11)
RDW: 15.5 % (ref 11.5–15.5)
WBC: 12.8 10*3/uL — AB (ref 4.0–10.5)

## 2016-04-21 MED ORDER — FUROSEMIDE 10 MG/ML IJ SOLN
40.0000 mg | Freq: Once | INTRAMUSCULAR | Status: AC
  Administered 2016-04-21: 40 mg via INTRAVENOUS

## 2016-04-21 MED ORDER — FUROSEMIDE 10 MG/ML IJ SOLN
INTRAMUSCULAR | Status: AC
  Administered 2016-04-21: 40 mg via INTRAVENOUS
  Filled 2016-04-21: qty 4

## 2016-04-21 MED ORDER — FUROSEMIDE 10 MG/ML IJ SOLN
20.0000 mg | Freq: Once | INTRAMUSCULAR | Status: AC
  Administered 2016-04-21: 20 mg via INTRAVENOUS
  Filled 2016-04-21: qty 2

## 2016-04-21 MED ORDER — IPRATROPIUM-ALBUTEROL 0.5-2.5 (3) MG/3ML IN SOLN
3.0000 mL | Freq: Four times a day (QID) | RESPIRATORY_TRACT | Status: DC
  Administered 2016-04-21 – 2016-04-22 (×4): 3 mL via RESPIRATORY_TRACT
  Filled 2016-04-21 (×4): qty 3

## 2016-04-21 NOTE — Progress Notes (Signed)
Notified MD patient daughter reported swelling in the legs  and continue to have congestion despite receiving  lasix and breathing treatment.

## 2016-04-21 NOTE — Evaluation (Signed)
Occupational Therapy Evaluation Patient Details Name: Catherine Day MRN: [id number] DOB: 12/01/1927 Today's Date: 04/21/2016    History of Present Illness 82 year old female with PMH COPD, HTN, [facility] onset R sided-weakness and aphasia. MRI brain-- scattered acute infarcts in the left hemisphere primarily in the left temporal and occipital lobes, and Lt cerebellar artery territory. tPa was administered, taken to IR for revascularization of occluded Lt MCA, ACA and ICA terminus. She was electively intubated for the procedure; extubated 11/24.  Pt went to rehab and returned the same Day with SOB and wheezing. Pt with aspiration pneuomitis   Clinical Impression   Pt admitted with the above diagnosis, was d/c'd to rehab and returned with the deficits listed below. [place] in [place] her daughter who works but was fully independent before visiting her son here for Thanksgiving.  Pt returns to acute care with wheezing, SOB and possible aspiration pneumonia.  Pt would benefit from returning to rehab if she can tolerate it with her current breathing situation so [facility] [place] [facility] [facility] [place] and be with her daughter when she is not at work.  Pt not safe to be home alone at this time but may be after rehab if her breathing improves.  Pt's O2 sats during session were 93%.      Follow Up Recommendations  CIR;Supervision/Assistance - 24 hour    Equipment Recommendations       Recommendations for Other Services Rehab consult     Precautions / Restrictions Precautions Precautions: Fall Precaution Comments: on 2L 02 at baseline Restrictions Weight Bearing Restrictions: No      Mobility Bed Mobility Overal bed mobility: Needs Assistance Bed Mobility: Supine to Sit     Supine to sit: Supervision;HOB elevated        Transfers Overall transfer level: Needs assistance Equipment used: Rolling walker (2 wheeled) Transfers: Sit to/from  UGI CorporationStand;Stand Pivot Transfers Sit to Stand: Min guard Stand pivot transfers: Min assist       General transfer comment: VC's for hand placement on seated surface for safety. Pt required assist for controlled descent to chair.     Balance Overall balance assessment: Needs assistance Sitting-balance support: No upper extremity supported;Feet supported Sitting balance-Leahy Scale: Fair     Standing balance support: Bilateral upper extremity supported;During functional activity Standing balance-Leahy Scale: Poor Standing balance comment: Pt requires walker/outside support to remain safely standing.                            ADL Overall ADL's : Needs assistance/impaired Eating/Feeding: Set up;Sitting   Grooming: Oral care;Wash/dry face;Wash/dry hands;Set up;Sitting   Upper Body Bathing: Set up;Sitting   Lower Body Bathing: Moderate assistance;Sit to/from stand;Cueing for compensatory techniques Lower Body Bathing Details (indicate cue type and reason): Pt becomes very wheezy even with little activity. Upper Body Dressing : Minimal assistance;Sitting   Lower Body Dressing: Moderate assistance;Sit to/from stand Lower Body Dressing Details (indicate cue type and reason): pt able to cross foot over opposite knee to don and doff socks Toilet Transfer: Min guard;RW;BSC   Toileting- Clothing Manipulation and Hygiene: Min guard;Sit to/from stand       Functional mobility during ADLs: Minimal assistance;Rolling walker General ADL Comments: Per daughter, pt required a lot of encouragement to shower, but once in the shower, performed by herself.      Vision Vision Assessment?: Yes Eye Alignment: Within Functional Limits  Ocular Range of Motion: Within Functional Limits Alignment/Gaze Preference: Within Defined Limits Tracking/Visual Pursuits: Able to track stimulus in all quads without difficulty Saccades: Within functional limits Convergence: Within functional  limits Visual Fields: No apparent deficits   Perception     Praxis Praxis Praxis tested?: Within functional limits    Pertinent Vitals/Pain Pain Assessment: No/denies pain     Hand Dominance Right   Extremity/Trunk Assessment Upper Extremity Assessment Upper Extremity Assessment: Generalized weakness   Lower Extremity Assessment Lower Extremity Assessment: Defer to PT evaluation   Cervical / Trunk Assessment Cervical / Trunk Assessment: Normal   Communication Communication Communication: No difficulties   Cognition Arousal/Alertness: Awake/alert Behavior During Therapy: WFL for tasks assessed/performed Overall Cognitive Status: Impaired/Different from baseline Area of Impairment: Orientation;Memory;Safety/judgement;Awareness Orientation Level: Place;Time Current Attention Level: Sustained Memory: Decreased short-term memory   Safety/Judgement: Decreased awareness of safety;Decreased awareness of deficits Awareness: Emergent Problem Solving: Slow processing;Requires verbal cues General Comments: family in room and reports she is not near her baseline.   General Comments       Exercises       Shoulder Instructions      Home Living Family/patient expects to be discharged to:: Inpatient rehab Living Arrangements: Children;Other (Comment) ([place]ives in [place] daughter who works.) Available Help at Discharge: Family;Available PRN/intermittently                         Home Equipment: Walker - 4 wheels   Additional Comments: pt reports she lives in [place]; visiting her son  Lives With: Daughter    Prior Functioning/Environment Level of Independence: Independent        Comments: Pt does not drive but is independent prior to admit otherwise.        OT Problem List: Decreased activity tolerance;Impaired balance (sitting and/or standing);Decreased strength;Decreased cognition;Decreased knowledge of use of DME or AE;Decreased safety awareness   OT  Treatment/Interventions: Self-care/ADL training;DME and/or AE instruction;Therapeutic exercise;Balance training;Patient/family education;Therapeutic activities    OT Goals(Current goals can be found in the care plan section) Acute Rehab OT Goals Patient Stated Goal: [place] [facility] [place] [facility] OT Goal Formulation: With patient/family Time For Goal Achievement: 05/05/16 Potential to Achieve Goals: Good ADL Goals Pt Will Perform Grooming: with supervision;standing Pt Will Perform Lower Body Dressing: with supervision;sit to/from stand Pt Will Perform Tub/Shower Transfer: Shower transfer;rolling walker;ambulating;with supervision Additional ADL Goal #1: Pt will walk to bathroom with walker and do all toileting tasks with S.  OT Frequency: Min 3X/week   Barriers to D/C:            Co-evaluation              End of Session Equipment Utilized During Treatment: Oxygen;Rolling walker Nurse Communication: Mobility status;Other (comment) (O2 sats)  Activity Tolerance: Patient limited by fatigue Patient left: in chair;with call bell/phone within reach;with chair alarm set;with family/visitor present   Time: [id number] OT Time Calculation (min): 40 min Charges:  OT General Charges $OT Visit: 1 Procedure OT Evaluation $OT Eval Moderate Complexity: 1 Procedure OT Treatments $Self Care/Home Management : 8-22 mins G-Codes:    Hope BuddsJones, Dianelys Scinto Anne 04/21/2016, 1:27 PM  [phone]

## 2016-04-21 NOTE — Progress Notes (Signed)
PROGRESS NOTE  Catherine ClossRegina Day WGN:[id number] DOB: 03/25/1928 DOA: 04/15/2016 PCP: No primary care provider on file.   LOS: 6 days   Brief Narrative: 82 yo right-handed female from [place] w/ a Hxof COPD (on 2 L O2) and HLD who presented on April 10, 2016 with the sudden onset of right-sided weakness and an inability to speak. CT and MRI showed small volume scattered acute infarcts in theleft hemisphere primarily in the left temporal and occipital lobes. No associated mass effect. Small number of other scattered small bilateral MCA left cerebellar artery territory infarcts.  The patient did receive tPA. MRI showed possible occlusion of the left PCA-P3 inferior division. Underwent bilateral common carotid arteriograms followed by complete revascularization of occluded left MCA, left ACA, left ICA terminus. Neurology consulted, maintained on aspirin therapy for CVA prophylaxis.  TEE noted no intracardiac source of embolism or shunt. She was transferred to CIR, but while there she suffered worsening hypoxia and on 11/29 required transfer back to the acute hospital for aspiration pneumonitis.    Assessment & Plan: Active Problems:   Aspiration pneumonia (HCC)   Aspiration pneumonitis (HCC)   Chronic systolic CHF (congestive heart failure) (HCC)   Cerebrovascular accident (CVA) due to bilateral thrombosis of vertebral arteries (HCC)   UTI due to extended-spectrum beta lactamase (ESBL) producing Escherichia coli   Hypocalcemia   Aspiration pneumonia / pneumonitis - completed 5 days antibiotics - cleared for D3 diet w/ nectar liquids by SLP  - WBC normalized and patient afebrile - Significant wheezing on exam this morning, audible without stethoscope  PositiveESBL Escherichia coliUTI - Continue meropenem, can probably transition to nitrofurantoin on discharge  Recent CVA - s/p IV TPA and IR treatments with R hemiparesthesia and dysphagia - hopefully can go back to CIR shortly - PT to  evaluate, will likely need reconsult for inpatient rehabilitation if she still will meet criteria, awaiting physical therapy note  Chronic Systolic CHF - No evidence of severe volume overload at present however I wonder whether some of the wheezing can be related to mild fluid overload, will give 1 dose of IV Lasix today and closely monitor respiratory status and renal function   Hypokalemia - Replaced to goal   DVT prophylaxis: heparin Code Status: Full code Family Communication: no family at bedside Disposition Plan: CIR 2-3 days  Consultants:   PCCM  Procedures:   None   Antimicrobials: Zosyn 11/29 >12/2 Vancomycin 11/29 >12/2 Meropenem 12/2 >   Subjective: - no chest pain, no abdominal pain, nausea or vomiting.  - She denies any shortness of breath even with her audible wheezing  Objective: Vitals:   04/20/16 1544 04/20/16 2104 04/20/16 2121 04/21/16 0535  BP: (!) 130/58 (!) 155/67  (!) 148/54  Pulse: 90 88 85 86  Resp: 15 (!) 2 20 20   Temp: 98.9 F (37.2 C) 98 F (36.7 C)  98.2 F (36.8 C)  TempSrc: Oral Oral  Oral  SpO2: 94% 96% 97% 98%  Weight: 80.7 kg (177 lb 14.4 oz)     Height: 5\' 2"  (1.575 m)       Intake/Output Summary (Last 24 hours) at 04/21/16 1133 Last data filed at 04/21/16 1054  Gross per 24 hour  Intake              220 ml  Output             2080 ml  Net            -1860 ml  Filed Weights   04/19/16 0412 04/20/16 0423 04/20/16 1544  Weight: 81.1 kg (178 lb 11.2 oz) 79.7 kg (175 lb 9.6 oz) 80.7 kg (177 lb 14.4 oz)    Examination: Constitutional: no apparent distress although appears tachypneic  Vitals:   04/20/16 1544 04/20/16 2104 04/20/16 2121 04/21/16 0535  BP: (!) 130/58 (!) 155/67  (!) 148/54  Pulse: 90 88 85 86  Resp: 15 (!) 2 20 20   Temp: 98.9 F (37.2 C) 98 F (36.7 C)  98.2 F (36.8 C)  TempSrc: Oral Oral  Oral  SpO2: 94% 96% 97% 98%  Weight: 80.7 kg (177 lb 14.4 oz)     Height: 5\' 2"  (1.575 m)      Eyes: lids  and conjunctivae normal Respiratory: Significant bilateral wheezing, moves air well  Cardiovascular: Regular rate and rhythm, no murmurs / rubs / gallops. Trace LE edema.  Abdomen: no tenderness. Bowel sounds positive.  Skin: no rashes, lesions, ulcers. No induration Neurologic: non focal, symmetric. Weak overall   Data Reviewed: I have personally reviewed following labs and imaging studies  CBC:  Recent Labs Lab 04/14/16 1534 04/14/16 2250 04/15/16 0158 04/16/16 0310 04/21/16 0523  WBC 10.0 14.4* 12.3* 8.8 12.8*  NEUTROABS  --  13.4*  --   --   --   HGB 10.4* 11.7* 10.4* 9.4* 10.8*  HCT 31.4* 35.9* 32.0* 29.5* 35.5*  MCV 84.2 84.7 83.8 83.8 87.7  PLT 183 234 187 234 366   Basic Metabolic Panel:  Recent Labs Lab 04/15/16 0158 04/16/16 0310 04/17/16 0345 04/18/16 0241 04/19/16 0204 04/20/16 0200 04/21/16 0523  NA 139 142 141 141  --  137 137  K 3.3* 3.4* 3.9 4.0  --  4.4 4.2  CL 109 110 109 109  --  101 97*  CO2 20* 24 23 25   --  29 32  GLUCOSE 141* 100* 135* 109*  --  141* 104*  BUN 11 9 11 11   --  11 21*  CREATININE 0.72 0.53 0.59 0.80  --  0.80 0.71  CALCIUM 7.1* 6.5* 6.7* 7.0*  --  7.8* 8.7*  MG 1.7  --  1.9 1.7 1.8 1.9  --   PHOS 1.3* 2.2*  --   --   --   --   --    GFR: Estimated Creatinine Clearance: 49.6 mL/min (by C-G formula based on SCr of 0.71 mg/dL). Liver Function Tests:  Recent Labs Lab 04/14/16 2250 04/18/16 0241 04/21/16 0523  AST 30 31 29   ALT 22 22 24   ALKPHOS 68 54 50  BILITOT 0.7 0.5 0.6  PROT 6.6 5.8* 6.0*  ALBUMIN 2.8* 2.5* 2.6*   No results for input(s): LIPASE, AMYLASE in the last 168 hours. No results for input(s): AMMONIA in the last 168 hours. Coagulation Profile: No results for input(s): INR, PROTIME in the last 168 hours. Cardiac Enzymes: No results for input(s): CKTOTAL, CKMB, CKMBINDEX, TROPONINI in the last 168 hours. BNP (last 3 results) No results for input(s): PROBNP in the last 8760 hours. HbA1C: No results  for input(s): HGBA1C in the last 72 hours. CBG: No results for input(s): GLUCAP in the last 168 hours. Lipid Profile: No results for input(s): CHOL, HDL, LDLCALC, TRIG, CHOLHDL, LDLDIRECT in the last 72 hours. Thyroid Function Tests: No results for input(s): TSH, T4TOTAL, FREET4, T3FREE, THYROIDAB in the last 72 hours. Anemia Panel: No results for input(s): VITAMINB12, FOLATE, FERRITIN, TIBC, IRON, RETICCTPCT in the last 72 hours. Urine analysis:    Component  Value Date/Time   COLORURINE YELLOW 04/15/2016 0947   APPEARANCEUR HAZY (A) 04/15/2016 0947   LABSPEC 1.018 04/15/2016 0947   PHURINE 5.5 04/15/2016 0947   GLUCOSEU NEGATIVE 04/15/2016 0947   HGBUR TRACE (A) 04/15/2016 0947   BILIRUBINUR NEGATIVE 04/15/2016 0947   KETONESUR NEGATIVE 04/15/2016 0947   PROTEINUR NEGATIVE 04/15/2016 0947   NITRITE POSITIVE (A) 04/15/2016 0947   LEUKOCYTESUR MODERATE (A) 04/15/2016 0947   Sepsis Labs: Invalid input(s): PROCALCITONIN, LACTICIDVEN  Recent Results (from the past 240 hour(s))  Culture, blood (routine x 2)     Status: None   Collection Time: 04/15/16  2:06 AM  Result Value Ref Range Status   Specimen Description BLOOD RIGHT WRIST  Final   Special Requests IN PEDIATRIC BOTTLE  Final   Culture NO GROWTH 5 DAYS  Final   Report Status 04/20/2016 FINAL  Final  Culture, blood (routine x 2)     Status: None   Collection Time: 04/15/16  2:12 AM  Result Value Ref Range Status   Specimen Description BLOOD RIGHT HAND  Final   Special Requests BOTTLES DRAWN AEROBIC AND ANAEROBIC  Final   Culture NO GROWTH 5 DAYS  Final   Report Status 04/20/2016 FINAL  Final  Urine culture     Status: Abnormal   Collection Time: 04/15/16  9:47 AM  Result Value Ref Range Status   Specimen Description URINE, RANDOM  Final   Special Requests ADDED [id number]  Final   Culture (A)  Final    40,000 COLONIES/mL ESCHERICHIA COLI Confirmed Extended Spectrum Beta-Lactamase Producer (ESBL)    Report  Status 04/18/2016 FINAL  Final   Organism ID, Bacteria ESCHERICHIA COLI (A)  Final      Susceptibility   Escherichia coli - MIC*    AMPICILLIN >=32 RESISTANT Resistant     CEFAZOLIN >=64 RESISTANT Resistant     CEFTRIAXONE 8 RESISTANT Resistant     CIPROFLOXACIN >=4 RESISTANT Resistant     GENTAMICIN >=16 RESISTANT Resistant     IMIPENEM <=0.25 SENSITIVE Sensitive     NITROFURANTOIN <=16 SENSITIVE Sensitive     TRIMETH/SULFA >=320 RESISTANT Resistant     AMPICILLIN/SULBACTAM >=32 RESISTANT Resistant     PIP/TAZO >=128 RESISTANT Resistant     Extended ESBL POSITIVE Resistant     * 40,000 COLONIES/mL ESCHERICHIA COLI  Urine culture     Status: None   Collection Time: 04/16/16  4:42 PM  Result Value Ref Range Status   Specimen Description URINE, CLEAN CATCH  Final   Special Requests PATIENT ON FOLLOWING ZOSYN  Final   Culture NO GROWTH  Final   Report Status 04/17/2016 FINAL  Final      Radiology Studies: Dg Chest Port 1 View  Result Date: 04/21/2016 CLINICAL DATA:  Follow-up wheezing, COPD. EXAM: PORTABLE CHEST 1 VIEW COMPARISON:  04/20/2016 FINDINGS: There is hyperinflation of the lungs compatible with COPD. Heart and mediastinal contours are within normal limits. No focal opacities or effusions. No acute bony abnormality. IMPRESSION: COPD.  No active disease. Electronically Signed   By: Charlett Nose M.D.   On: 04/21/2016 08:27   Dg Chest Port 1 View  Result Date: 04/20/2016 CLINICAL DATA:  Pneumonia. EXAM: PORTABLE CHEST 1 VIEW COMPARISON:  04/16/2016. FINDINGS: Mediastinum and hilar structures are normal. Heart size stable. Low lung volumes with basilar atelectasis. Slight improvement of right base infiltrate. A component of right base bronchiectasis cannot be excluded. Tiny left pleural effusion. No pneumothorax. IMPRESSION: 1. Interim partial clearing  of right base infiltrate. A component of bronchiectasis in the right base cannot be excluded. 2. Low lung volumes.  Small left  pleural effusion noted. Electronically Signed   By: Maisie Fus  Register   On: 04/20/2016 07:20     Scheduled Meds: . aspirin  325 mg Oral Daily  . famotidine  20 mg Oral BID  . heparin  5,000 Units Subcutaneous Q8H  . irbesartan  75 mg Oral Daily   And  . hydrochlorothiazide  12.5 mg Oral Daily  . ipratropium-albuterol  3 mL Nebulization QID  . mouth rinse  15 mL Mouth Rinse BID  . meropenem (MERREM) IV  1 g Intravenous Q8H   Continuous Infusions:   Pamella Pert, MD, PhD [facility] Pager [phone]  If 7PM-7AM, please contact night-coverage [email or web address] Password [email or web address] 04/21/2016, 11:33 AM

## 2016-04-21 NOTE — Progress Notes (Signed)
Rehab admissions - I am following patient for potential re-admit to acute inpatient rehab once patient is medically ready.  Noted OT recommending inpatient rehab.  Call me for questions.  #[phone]

## 2016-04-21 NOTE — Progress Notes (Signed)
Speech Language Pathology Treatment: Dysphagia  Patient Details Name: Catherine ClossRegina Day MRN: [id number] DOB: 09/19/1927 Today's Date: 04/21/2016 Time: 0901-0910 SLP Time Calculation (min) (ACUTE ONLY): 9 min  Assessment / Plan / Recommendation Clinical Impression  Pt seen near the end of am meal, expiratory wheeze, short of breath, mild intermittent cough observed. SLP asked pt to stop eating, repositioned her more appropriately in bed to better open her chest for respiration, encourage hard expectoration. Pts breathing improved a bit, SLP called for breathing treatment if possible. Provided min assist for small single sips of nectar thick with no further coughing. Pt did not want any more solids. Reinforced impact of respiration and fatigue on swallowing and encouraged daughter to call for staff to reposition before meals. Also encouraged rest break during meals and waiting for breathing treatment in am before meal. Pts dysphagia appears stable, but is dependent on respiratory function and careful precautions. Will follow for tolerance 1x a week and possible upgrade if pt improves.    HPI HPI: 82 y.o. right handed female with h/o COPD. Presented 04/10/2016 with sudden onset of right-sided weakness and inability to speak. 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 Pt discharged to CIR 11/28, however, later that night she became dyspneic and diaphoretic. She was given albuterol nebs with some improvement. CXR with RLL infiltrate.      SLP Plan  Continue with current plan of care     Recommendations  Diet recommendations: Dysphagia 3 (mechanical soft);Nectar-thick liquid Liquids provided via: Cup Medication Administration: Whole meds with puree Supervision: Staff to assist with self feeding Compensations: Small sips/bites;Slow rate (Rest breaks during meal) Postural  Changes and/or Swallow Maneuvers: Seated upright 90 degrees                Oral Care Recommendations: Oral care BID Follow up Recommendations: Inpatient Rehab Plan: Continue with current plan of care       GO                Catherine Day, Catherine NearingBonnie Day 04/21/2016, 10:00 AM

## 2016-04-21 NOTE — Progress Notes (Signed)
Pharmacy Antibiotic Note  Catherine Day is a 82 y.o. female admitted on 04/15/2016 with HCAP and ESBL Ecoli UTI.  Patient is s/p 5 days of vanc/zosyn for HCAP and pharmacy continuing to follow for meropenem dosing. SCr 0.71 (CrCL ~50 ml/min).   Plan: - Continue meropenem 1 gm IV q8h  - Monitor clinical progress, renal function, abx plan/LOT    Height: 5\' 2"  (157.5 cm) Weight: 177 lb 14.4 oz (80.7 kg) IBW/kg (Calculated) : 50.1  Temp (24hrs), Avg:98.4 F (36.9 C), Min:98 F (36.7 C), Max:98.9 F (37.2 C)   Recent Labs Lab 04/14/16 1534 04/14/16 2250 04/14/16 2255 04/15/16 0158 04/16/16 0310 04/16/16 1138 04/17/16 0345 04/18/16 0241 04/20/16 0200 04/21/16 0523  WBC 10.0 14.4*  --  12.3* 8.8  --   --   --   --  12.8*  CREATININE 0.70 0.76  --  0.72 0.53  --  0.59 0.80 0.80 0.71  LATICACIDVEN  --   --  2.0*  --   --  1.1  --   --   --   --     Estimated Creatinine Clearance: 49.6 mL/min (by C-G formula based on SCr of 0.71 mg/dL).    No Known Allergies  Antimicrobials this admission: 11/29 vanc >> 12/2 11/29 zosyn >> 12/2 12/2 meropenem >>   Dose adjustments this admission: N/A  Microbiology results: 11/29 BCx: ngtd 11/30 UCx: ESBL Ecoli  11/23 MRSA PCR: NEG   Thank you for allowing us to participate in this patients care. Signe Coltonya C Broderick Fonseca, PharmD Pager: [phone] 04/21/2016 11:21 AM

## 2016-04-21 NOTE — Significant Event (Signed)
Rapid Response Event Note RN called for a second set of eyes Overview: Time Called: 1920 Arrival Time: 1925 Event Type: Respiratory  Initial Focused Assessment: Upon arrival pt alert and oriented x3, denies any pain, endorses "some SOB", lungs diminished with inspiratory wheezing, prolonged expiratory wheezing noted through out, upper airway congestion. Some abd breathing noted. Pt reports daughter helped her eat today which included V8 juice, pt is on a nectar thick liquids diet.   BP 129/74, HR 95, 97% 2L, RR 20, 97.8 oral temp.   post albuterol treatment inspiratory wheezing resolved, continues to have prolonged expiratory wheezing.  Interventions: CXR, albuterol tx 2.5 mg, Lasix 40 mg IVP one time dose ordered and completed.  Plan of Care (if not transferred): Continue to monitor pt for now, Clydie BraunKaren RN made aware of plan, encouraged to call if needed. F/U 0030 pt resting at this time.  Event Summary: Name of Physician Notified:  (paged PTA RRT ) at      at    Outcome: Stayed in room and stabalized     Davide Risdon PittmanPaige

## 2016-04-21 NOTE — Care Management Important Message (Signed)
Important Message  Patient Details  Name: Catherine ClossRegina Bowns MRN: [id number] Date of Birth: 09/08/1927   Medicare Important Message Given:  Yes    Brittney Mucha Abena 04/21/2016, 9:41 AM

## 2016-04-21 NOTE — Evaluation (Signed)
Physical Therapy Evaluation Patient Details Name: Catherine Day MRN: [id number] DOB: 10/05/1927 Today's Date: 04/21/2016   History of Present Illness  82 year old female with PMH COPD, HTN, [facility] with sudden onset R sided-weakness and aphasia. MRI brain-- scattered acute infarcts in the left hemisphere primarily in the left temporal and occipital lobes, and Lt cerebellar artery territory. tPa was administered, taken to IR for revascularization of occluded Lt MCA, ACA and ICA terminus. She was electively intubated for the procedure; extubated 11/24.  Pt went to rehab and returned the same day with SOB and wheezing. Pt with aspiration pneuomitis  Clinical Impression  Pt admitted with/for SOB due to aspiration phemonitis.  Pt at minimal assist level presently.  Pt currently limited functionally due to the problems listed. ( See problems list.)   Pt will benefit from PT to maximize function and safety in order to get ready for next venue listed below.     Follow Up Recommendations CIR    Equipment Recommendations  Other (comment) (TBA)    Recommendations for Other Services Rehab consult     Precautions / Restrictions Precautions Precautions: Fall Precaution Comments: on 2L 02 at baseline Restrictions Weight Bearing Restrictions: No      Mobility  Bed Mobility Overal bed mobility: Needs Assistance Bed Mobility: Supine to Sit;Sit to Supine     Supine to sit: Supervision;HOB elevated Sit to supine: Min assist   General bed mobility comments: getting back into bed needed minimal assist  Transfers Overall transfer level: Needs assistance Equipment used: 1 person hand held assist Transfers: Sit to/from Stand Sit to Stand: Min guard         General transfer comment: cues for hand placement  Ambulation/Gait Ambulation/Gait assistance: Min assist Ambulation Distance (Feet): 4 Feet (forward and back times 2 to extent of the O2 tubing.) Assistive device: 1  person hand held assist Gait Pattern/deviations: Step-through pattern   Gait velocity interpretation: Below normal speed for age/gender General Gait Details: generally steady, cues for posture.  watery breath sound appeared labored, but color good.  Stairs            Wheelchair Mobility    Modified Rankin (Stroke Patients Only) Modified Rankin (Stroke Patients Only) Pre-Morbid Rankin Score: No symptoms Modified Rankin: Moderately severe disability     Balance Overall balance assessment: Needs assistance Sitting-balance support: No upper extremity supported Sitting balance-Leahy Scale: Fair     Standing balance support: Single extremity supported Standing balance-Leahy Scale: Poor Standing balance comment: needed minimal external support                             Pertinent Vitals/Pain Pain Assessment: No/denies pain    Home Living Family/patient expects to be discharged to:: Inpatient rehab Living Arrangements: Children;Other (Comment) (lives in [place] with daughter who works.) Available Help at Discharge: Family;Available PRN/intermittently           Home Equipment: Walker - 4 wheels Additional Comments: pt reports she lives in [place]; visiting her son    Prior Function Level of Independence: Independent         Comments: Pt does not drive but is independent prior to admit otherwise.     Hand Dominance   Dominant Hand: Right    Extremity/Trunk Assessment   Upper Extremity Assessment: Defer to OT evaluation           Lower Extremity Assessment: Generalized weakness (R not far different  than L LE)      Cervical / Trunk Assessment: Normal  Communication   Communication: No difficulties  Cognition Arousal/Alertness: Awake/alert Behavior During Therapy: WFL for tasks assessed/performed Overall Cognitive Status: Impaired/Different from baseline Area of Impairment: Orientation;Memory;Safety/judgement;Awareness Orientation Level:  Place;Time Current Attention Level: Sustained Memory: Decreased short-term memory   Safety/Judgement: Decreased awareness of safety;Decreased awareness of deficits Awareness: Emergent Problem Solving: Slow processing;Requires verbal cues General Comments: family in room and reports she is not near her baseline.    General Comments      Exercises     Assessment/Plan    PT Assessment Patient needs continued PT services  PT Problem List Decreased strength;Decreased activity tolerance;Decreased balance;Decreased mobility;Decreased knowledge of use of DME;Cardiopulmonary status limiting activity          PT Treatment Interventions DME instruction;Gait training;Stair training;Functional mobility training;Therapeutic activities;Balance training;Neuromuscular re-education;Cognitive remediation;Patient/family education    PT Goals (Current goals can be found in the Care Plan section)  Acute Rehab PT Goals Patient Stated Goal: [place] [facility] [place] PT Goal Formulation: With patient Time For Goal Achievement: 04/18/16 Potential to Achieve Goals: Good    Frequency Min 4X/week   Barriers to discharge Other (comment)      Co-evaluation               End of Session                 Time: [id number] PT Time Calculation (min) (ACUTE ONLY): 19 min   Charges:   PT Evaluation $PT Eval Moderate Complexity: 1 Procedure     PT G CodesEliseo Gum:        Daianna Vasques V Miyako Oelke 04/21/2016, 6:10 PM 04/21/2016  [place] BingKen Husein Guedes, PT [phone]  (pager)

## 2016-04-22 ENCOUNTER — Encounter (HOSPITAL_COMMUNITY): Payer: Medicare Other

## 2016-04-22 DIAGNOSIS — I5043 Acute on chronic combined systolic (congestive) and diastolic (congestive) heart failure: Secondary | ICD-10-CM

## 2016-04-22 LAB — BASIC METABOLIC PANEL
ANION GAP: 9 (ref 5–15)
BUN: 27 mg/dL — ABNORMAL HIGH (ref 6–20)
CO2: 37 mmol/L — ABNORMAL HIGH (ref 22–32)
Calcium: 8.7 mg/dL — ABNORMAL LOW (ref 8.9–10.3)
Chloride: 93 mmol/L — ABNORMAL LOW (ref 101–111)
Creatinine, Ser: 0.9 mg/dL (ref 0.44–1.00)
GFR calc Af Amer: >60 mL/min (ref >60)
GFR, EST NON AFRICAN AMERICAN: 56 mL/min — AB (ref >60)
Glucose, Bld: 95 mg/dL (ref 65–99)
POTASSIUM: 3.5 mmol/L (ref 3.5–5.1)
SODIUM: 139 mmol/L (ref 135–145)

## 2016-04-22 LAB — CBC
HCT: 38.7 % (ref 36.0–46.0)
Hemoglobin: 11.8 g/dL — ABNORMAL LOW (ref 12.0–15.0)
MCH: 26.4 pg (ref 26.0–34.0)
MCHC: 30.5 g/dL (ref 30.0–36.0)
MCV: 86.6 fL (ref 78.0–100.0)
PLATELETS: 384 10*3/uL (ref 150–400)
RBC: 4.47 MIL/uL (ref 3.87–5.11)
RDW: 15.6 % — ABNORMAL HIGH (ref 11.5–15.5)
WBC: 15.4 10*3/uL — AB (ref 4.0–10.5)

## 2016-04-22 MED ORDER — FUROSEMIDE 10 MG/ML IJ SOLN: 20.0000 mg | Freq: Once | INTRAMUSCULAR | Status: DC

## 2016-04-22 MED ORDER — FUROSEMIDE 10 MG/ML IJ SOLN
40.0000 mg | Freq: Once | INTRAMUSCULAR | Status: AC
  Administered 2016-04-22: 40 mg via INTRAVENOUS
  Filled 2016-04-22: qty 4

## 2016-04-22 MED ORDER — POTASSIUM CHLORIDE CRYS ER 20 MEQ PO TBCR
40.0000 meq | EXTENDED_RELEASE_TABLET | Freq: Once | ORAL | Status: AC
  Administered 2016-04-22: 40 meq via ORAL
  Filled 2016-04-22: qty 2

## 2016-04-22 MED ORDER — IPRATROPIUM-ALBUTEROL 0.5-2.5 (3) MG/3ML IN SOLN
3.0000 mL | Freq: Three times a day (TID) | RESPIRATORY_TRACT | Status: DC
  Administered 2016-04-23 – 2016-04-27 (×14): 3 mL via RESPIRATORY_TRACT
  Filled 2016-04-22 (×14): qty 3

## 2016-04-22 MED ORDER — BUDESONIDE 0.25 MG/2ML IN SUSP
0.2500 mg | Freq: Two times a day (BID) | RESPIRATORY_TRACT | Status: DC
Start: 2016-04-22 — End: 2016-04-24
  Administered 2016-04-22 – 2016-04-24 (×4): 0.25 mg via RESPIRATORY_TRACT
  Filled 2016-04-22 (×4): qty 2

## 2016-04-22 NOTE — Progress Notes (Signed)
Patients o2 was increased to 3 liters.

## 2016-04-22 NOTE — Progress Notes (Signed)
Dr. Clearence PedSchorr paged to notify patient has pulled out her foley catheter. Reason in for urinary retention and diuresis. Inquiring if want to replace tonight. Awaiting response.

## 2016-04-22 NOTE — Progress Notes (Signed)
Rehab admissions - I met with patient and her daughter today.  Patient not yet ready for acute inpatient rehab admission.  I will follow progress over the next 1-2 days and await medical readiness.  Call me for questions.  #[phone]

## 2016-04-22 NOTE — Care Management Note (Addendum)
Case Management Note  Patient Details  Name: Catherine ClossRegina Day MRN: [id number] Date of Birth: 08/11/1927  Subjective/Objective:    Pt admitted from CIR with SOB/ aspiration pneumonitis, hx of COPD, HTN, CVA(recent).         Action/Plan:  Plan is to discharge today back to CIR vs SNF.  Expected Discharge Date:   04/22/2016            Expected Discharge Plan:  IP Rehab Facility  In-House Referral:     Discharge planning Services  CM Consult   Status of Service:  COMPLETED  If discussed at Long Length of Stay Meetings, dates discussed:    Additional Comments:  Epifanio LeschesCole, Junita Kubota Hudson, RN 04/22/2016, 11:11 AM

## 2016-04-22 NOTE — Progress Notes (Signed)
PROGRESS NOTE  Catherine Day QJJ:[id number] DOB: April 07, 1928 DOA: 04/15/2016 PCP: No primary care provider on file.  Brief Narrative: 82 yo right-handed female from [place] w/ a Hxof COPD (on 2 L O2) and HLD who presented on April 10, 2016 with thesudden onset of right-sided weakness and an inability to speak. CT and MRI showed small volume scattered acute infarctsin theleft hemisphere primarily in the left temporal and occipital lobes. No associated mass effect. Small number of other scattered small bilateral MCA left cerebellar artery territory infarcts. The patient did receive tPA. MRI showed possible occlusion of the left PCA-P3 inferior division. Underwent bilateral common carotid arteriograms followed by complete revascularization of occluded left MCA, left ACA, left ICA terminus. Neurology consulted, maintained on aspirin therapy for CVA prophylaxis. TEE noted no intracardiac source of embolism or shunt. She was transferred to CIR, but while there she suffered worsening hypoxia and on 11/29 required transfer back to the acute hospital for aspiration pneumonitis.   Assessment & Plan: Acute on chronic respiratory failure with hypoxia -multifactorial including aspiration pneumonia, CHF in the setting of underlying COPD -stable on 3 L -add pulmicort  -flutter valve -continue duonebs q 6 hours -normally on 2 L [place] at home  Aspiration pneumonia / pneumonitis - completed7 days antibiotics - cleared for D3 diet w/ nectar liquids by SLP   PositiveESBL Escherichia coliUTI - Continue meropenem D#5 of 7  Recent CVA - s/p IV TPA and IR treatments with R hemiparesthesia and dysphagia - hopefully can go back to CIR shortly - PT to evaluate, will likely need reconsult for inpatient rehabilitation if she still will meet criteria  Acute on Chronic Systolic and diastolic CHF - pt had some evidence of fluid overload - repeat lasix IV x 1 today -NEG 3.5 L -appears previous  dry weight ~170 -continue ARB  Hypokalemia - Replaced to goal  Lower extremity pain and edema -venous duplex   DVT prophylaxis: heparin [place] Code Status: Full code Family Communication: daughter updated at bedside 12/6--Total time spent 35 minutes.  Greater than 50% spent face to face counseling and coordinating care.  Disposition Plan: CIR  12/7 or 12/8 if stable  Consultants:   PCCM  Procedures:   None   Antimicrobials: Zosyn 11/29 >12/2 Vancomycin 11/29 >12/2 Meropenem 12/2 >     Subjective: Patient denies fevers, chills, headache, chest pain, dyspnea, nausea, vomiting, diarrhea, abdominal pain, dysuria, hematuria, hematochezia, and melena.   Objective: Vitals:   04/22/16 0245 04/22/16 0453 04/22/16 0534 04/22/16 0554  BP:   (!) 131/58   Pulse: 76 90 90 91  Resp: (!) 22  18 (!) 22  Temp:      TempSrc:      SpO2: 91% 97% 98% 95%  Weight:      Height:        Intake/Output Summary (Last 24 hours) at 04/22/16 1116 Last data filed at 04/21/16 2300  Gross per 24 hour  Intake              240 ml  Output             1700 ml  Net            -1460 ml   Weight change: -1.95 kg (-4 lb 4.8 oz) Exam:   General:  Pt is alert, follows commands appropriately, not in acute distress  HEENT: No icterus, No thrush, No neck mass, [place]/AT  Cardiovascular: RRR, S1/S2, no rubs,  no gallops; + JVD  Respiratory: bibasilar crackles R>L, no wheeze  Abdomen: Soft/+BS, non tender, non distended, no guarding  Extremities: trace LE edema, No lymphangitis, No petechiae, No rashes, no synovitis   Data Reviewed: I have personally reviewed following labs and imaging studies Basic Metabolic Panel:  Recent Labs Lab 04/16/16 0310 04/17/16 0345 04/18/16 0241 04/19/16 0204 04/20/16 0200 04/21/16 0523 04/22/16 0617  NA 142 141 141  --  137 137 139  K 3.4* 3.9 4.0  --  4.4 4.2 3.5  CL 110 109 109  --  101 97* 93*  CO2 _0 --  29 32 37*  GLUCOSE 100* 135* 109*   --  141* 104* 95  BUN _1 --  11 21* 27*  CREATININE 0.53 0.59 0.80  --  0.80 0.71 0.90  CALCIUM 6.5* 6.7* 7.0*  --  7.8* 8.7* 8.7*  MG  --  1.9 1.7 1.8 1.9  --   --   PHOS 2.2*  --   --   --   --   --   --    Liver Function Tests:  Recent Labs Lab 04/18/16 0241 04/21/16 0523  AST 31 29  ALT 22 24  ALKPHOS 54 50  BILITOT 0.5 0.6  PROT 5.8* 6.0*  ALBUMIN 2.5* 2.6*   No results for input(s): LIPASE, AMYLASE in the last 168 hours. No results for input(s): AMMONIA in the last 168 hours. Coagulation Profile: No results for input(s): INR, PROTIME in the last 168 hours. CBC:  Recent Labs Lab 04/16/16 0310 04/21/16 0523 04/22/16 0617  WBC 8.8 12.8* 15.4*  HGB 9.4* 10.8* 11.8*  HCT 29.5* 35.5* 38.7  MCV 83.8 87.7 86.6  PLT 234 366 384   Cardiac Enzymes: No results for input(s): CKTOTAL, CKMB, CKMBINDEX, TROPONINI in the last 168 hours. BNP: Invalid input(s): POCBNP CBG: No results for input(s): GLUCAP in the last 168 hours. HbA1C: No results for input(s): HGBA1C in the last 72 hours. Urine analysis:    Component Value Date/Time   COLORURINE YELLOW 04/15/2016 0947   APPEARANCEUR HAZY (A) 04/15/2016 0947   LABSPEC 1.018 04/15/2016 0947   PHURINE 5.5 04/15/2016 0947   GLUCOSEU NEGATIVE 04/15/2016 0947   HGBUR TRACE (A) 04/15/2016 0947   BILIRUBINUR NEGATIVE 04/15/2016 0947   KETONESUR NEGATIVE 04/15/2016 0947   PROTEINUR NEGATIVE 04/15/2016 0947   NITRITE POSITIVE (A) 04/15/2016 0947   LEUKOCYTESUR MODERATE (A) 04/15/2016 0947   Sepsis Labs: _2 (procalcitonin:4,lacticidven:4) ) Recent Results (from the past 240 hour(s))  Culture, blood (routine x 2)     Status: None   Collection Time: 04/15/16  2:06 AM  Result Value Ref Range Status   Specimen Description BLOOD RIGHT WRIST  Final   Special Requests IN PEDIATRIC BOTTLE 3ML  Final   Culture NO GROWTH 5 DAYS  Final   Report Status 04/20/2016 FINAL  Final  Culture, blood (routine x 2)     Status:  None   Collection Time: 04/15/16  2:12 AM  Result Value Ref Range Status   Specimen Description BLOOD RIGHT HAND  Final   Special Requests BOTTLES DRAWN AEROBIC AND ANAEROBIC 5ML  Final   Culture NO GROWTH 5 DAYS  Final   Report Status 04/20/2016 FINAL  Final  Urine culture     Status: Abnormal   Collection Time: 04/15/16  9:47 AM  Result Value Ref Range Status   Specimen Description URINE, RANDOM  Final   Special Requests ADDED [id number]  Final   Culture (A)  Final    40,000 COLONIES/mL ESCHERICHIA COLI Confirmed Extended Spectrum Beta-Lactamase Producer (ESBL)    Report Status 04/18/2016 FINAL  Final   Organism ID, Bacteria ESCHERICHIA COLI (A)  Final      Susceptibility   Escherichia coli - MIC*    AMPICILLIN >=32 RESISTANT Resistant     CEFAZOLIN >=64 RESISTANT Resistant     CEFTRIAXONE 8 RESISTANT Resistant     CIPROFLOXACIN >=4 RESISTANT Resistant     GENTAMICIN >=16 RESISTANT Resistant     IMIPENEM <=0.25 SENSITIVE Sensitive     NITROFURANTOIN <=16 SENSITIVE Sensitive     TRIMETH/SULFA >=320 RESISTANT Resistant     AMPICILLIN/SULBACTAM >=32 RESISTANT Resistant     PIP/TAZO >=128 RESISTANT Resistant     Extended ESBL POSITIVE Resistant     * 40,000 COLONIES/mL ESCHERICHIA COLI  Urine culture     Status: None   Collection Time: 04/16/16  4:42 PM  Result Value Ref Range Status   Specimen Description URINE, CLEAN CATCH  Final   Special Requests PATIENT ON FOLLOWING ZOSYN  Final   Culture NO GROWTH  Final   Report Status 04/17/2016 FINAL  Final     Scheduled Meds: . aspirin  325 mg Oral Daily  . budesonide (PULMICORT) nebulizer solution  0.25 mg Nebulization BID  . famotidine  20 mg Oral BID  . furosemide  40 mg Intravenous Once  . heparin  5,000 Units Subcutaneous Q8H  . irbesartan  75 mg Oral Daily   And  . hydrochlorothiazide  12.5 mg Oral Daily  . ipratropium-albuterol  3 mL Nebulization Q6H  . mouth rinse  15 mL Mouth Rinse BID  . meropenem (MERREM) IV  1  g Intravenous Q8H  . potassium chloride  40 mEq Oral Once   Continuous Infusions:  Procedures/Studies: Ct Head Wo Contrast  Result Date: 04/09/2016 CLINICAL DATA:  82 y/o F; code stroke post transarterial intervention. EXAM: CT HEAD WITHOUT CONTRAST TECHNIQUE: Contiguous axial images were obtained from the base of the skull through the vertex without intravenous contrast. COMPARISON:  04/09/2016 CT head. FINDINGS: Brain: No large territory acute infarct or intracranial hemorrhage identified. Patchy foci of hypoattenuation in periventricular and subcortical white matter may represent areas of infarction or chronic microvascular ischemic changes and are stable. Mild parenchymal volume loss. No focal mass effect. No extra-axial collection. No effacement of basilar cisterns. Vascular: Persistent opacification from previous contrast administration. Calcific atherosclerosis of carotid siphons. Skull: Normal. Negative for fracture or focal lesion. Sinuses/Orbits: Partial opacification of ethmoid and sphenoid sinuses with aerosolized secretions and partial opacification of mastoid air cells probably related to intubation. Orbits are unremarkable. Other: None. IMPRESSION: No large territory acute infarct or intracranial hemorrhage identified. Patchy foci of hypoattenuation in periventricular and subcortical white matter may represent areas of infarction or chronic microvascular ischemic changes and are stable. If clinically indicated MRI is more sensitive for acute ischemia. Electronically Signed   By: Kristine Garbe M.D.   On: 04/09/2016 22:51   Mr Virgel Paling DV Contrast  Result Date: 04/10/2016 CLINICAL DATA:  82 year old female status post neuro intervention on 04/09/2016 for acute onset right side weakness and inability to speak. Status post Bilateral cerebral angiogram with Revascularization of occluded LT MCA ,Lt ACA and Lt ICA terminus with x1 PASS WITH A COMBINATION OF solitaire 66m x 40 mm  retrieval device with simultaneous aspiration at the intermediate guide catheter and flow gate guide catheter, and 3.6 mg of superselective intracranial INTEGRELIN.  Initial encounter. EXAM: MRI HEAD WITHOUT CONTRAST MRA HEAD WITHOUT CONTRAST TECHNIQUE: Multiplanar, multiecho pulse sequences of the brain and surrounding structures were obtained without intravenous contrast. Angiographic images of the head were obtained using MRA technique without contrast. COMPARISON:  Post tPA head CT 04/09/2016 FINDINGS: MRI HEAD FINDINGS Brain: Multiple small cortically based and white-matter foci of restricted diffusion in the left temporal lobe, best seen on axial diffusion weighted imaging (series 4, image 20). Evidence of petechial hemorrhage in the left mesial temporal lobe which appears to track to the left cauda thalamic groove where diffusion is also heterogeneous. These areas demonstrate mild T2 and FLAIR hyperintensity without mass effect. In addition there are several other widely scattered mostly cortically based foci of restricted diffusion in both MCA territories at over the superior convexities. Similar scattered small infarcts in the left occipital lobe. One or 2 such areas in the left cerebellum. No other intracranial hemorrhage. No intracranial mass effect or ventriculomegaly. Superimposed patchy bilateral cerebral white matter T2 and FLAIR hyperintensity. No chronic cortical encephalomalacia. Right deep gray matter nuclei and brainstem are normal for age. Negative pituitary and cervicomedullary junction. Vascular: Major intracranial vascular flow voids are preserved. Skull and upper cervical spine: Negative. Normal bone marrow signal. Sinuses/Orbits: Normal orbits soft tissues. Trace paranasal sinus mucosal thickening. Mild mastoid effusions. Other: Negative scalp soft tissues. MRA HEAD FINDINGS Mildly motion degraded. Antegrade flow in the posterior circulation with mildly dominant distal right vertebral  artery. Normal PICA origins. Patent vertebrobasilar junction. Patent basilar artery without stenosis. Patent SCA and normal right PCA origins. Fetal type left PCA origin. Right posterior communicating artery diminutive or absent. Absent flow signal in the inferior left PCA P3 division. Otherwise the bilateral PCA branches appear normal. Antegrade flow in both ICA siphons with some generalized ICA dolichoectasia and tortuosity of the distal cervical right ICA. Patent carotid termini. Normal MCA and ACA origins. Generalized ACA dolichoectasia. Proximal ACA branches appear normal. Both MCA bifurcations appear patent. MCA branch detail is degraded by motion. Fairly symmetric appearance of bilateral MCA branch flow signal. IMPRESSION: 1. Small volume of scattered acute infarcts in the left hemisphere primarily in the left temporal and occipital lobes (note fetal type left PCA origin anatomy). 2. Petechial hemorrhage in the mesial left temporal lobe tracking to the cauda thalamic groove. No associated mass effect and no malignant hemorrhagic transformation. 3. A small number of other scattered small infarcts in the bilateral MCA and left cerebellar artery territories might be related to endovascular intervention. 4. Motion degraded intracranial MRA negative for emergent large vessel occlusion or proximal intracranial stenosis. Possible occlusion of the left PCA P3 inferior division. Electronically Signed   By: Genevie Ann M.D.   On: 04/10/2016 19:16   Mr Brain Wo Contrast  Result Date: 04/10/2016 CLINICAL DATA:  82 year old female status post neuro intervention on 04/09/2016 for acute onset right side weakness and inability to speak. Status post Bilateral cerebral angiogram with Revascularization of occluded LT MCA ,Lt ACA and Lt ICA terminus with x1 PASS WITH A COMBINATION OF solitaire 21m x 40 mm retrieval device with simultaneous aspiration at the intermediate guide catheter and flow gate guide catheter, and 3.6 mg of  superselective intracranial INTEGRELIN. Initial encounter. EXAM: MRI HEAD WITHOUT CONTRAST MRA HEAD WITHOUT CONTRAST TECHNIQUE: Multiplanar, multiecho pulse sequences of the brain and surrounding structures were obtained without intravenous contrast. Angiographic images of the head were obtained using MRA technique without contrast. COMPARISON:  Post tPA head CT 04/09/2016 FINDINGS: MRI HEAD FINDINGS  Brain: Multiple small cortically based and white-matter foci of restricted diffusion in the left temporal lobe, best seen on axial diffusion weighted imaging (series 4, image 20). Evidence of petechial hemorrhage in the left mesial temporal lobe which appears to track to the left cauda thalamic groove where diffusion is also heterogeneous. These areas demonstrate mild T2 and FLAIR hyperintensity without mass effect. In addition there are several other widely scattered mostly cortically based foci of restricted diffusion in both MCA territories at over the superior convexities. Similar scattered small infarcts in the left occipital lobe. One or 2 such areas in the left cerebellum. No other intracranial hemorrhage. No intracranial mass effect or ventriculomegaly. Superimposed patchy bilateral cerebral white matter T2 and FLAIR hyperintensity. No chronic cortical encephalomalacia. Right deep gray matter nuclei and brainstem are normal for age. Negative pituitary and cervicomedullary junction. Vascular: Major intracranial vascular flow voids are preserved. Skull and upper cervical spine: Negative. Normal bone marrow signal. Sinuses/Orbits: Normal orbits soft tissues. Trace paranasal sinus mucosal thickening. Mild mastoid effusions. Other: Negative scalp soft tissues. MRA HEAD FINDINGS Mildly motion degraded. Antegrade flow in the posterior circulation with mildly dominant distal right vertebral artery. Normal PICA origins. Patent vertebrobasilar junction. Patent basilar artery without stenosis. Patent SCA and normal right  PCA origins. Fetal type left PCA origin. Right posterior communicating artery diminutive or absent. Absent flow signal in the inferior left PCA P3 division. Otherwise the bilateral PCA branches appear normal. Antegrade flow in both ICA siphons with some generalized ICA dolichoectasia and tortuosity of the distal cervical right ICA. Patent carotid termini. Normal MCA and ACA origins. Generalized ACA dolichoectasia. Proximal ACA branches appear normal. Both MCA bifurcations appear patent. MCA branch detail is degraded by motion. Fairly symmetric appearance of bilateral MCA branch flow signal. IMPRESSION: 1. Small volume of scattered acute infarcts in the left hemisphere primarily in the left temporal and occipital lobes (note fetal type left PCA origin anatomy). 2. Petechial hemorrhage in the mesial left temporal lobe tracking to the cauda thalamic groove. No associated mass effect and no malignant hemorrhagic transformation. 3. A small number of other scattered small infarcts in the bilateral MCA and left cerebellar artery territories might be related to endovascular intervention. 4. Motion degraded intracranial MRA negative for emergent large vessel occlusion or proximal intracranial stenosis. Possible occlusion of the left PCA P3 inferior division. Electronically Signed   By: Genevie Ann M.D.   On: 04/10/2016 19:16   Dg Chest Port 1 View  Result Date: 04/21/2016 CLINICAL DATA:  Initial evaluation for acute respiratory distress, wheezing. History of COPD. EXAM: PORTABLE CHEST 1 VIEW COMPARISON:  Prior radiograph from 04/21/2016. FINDINGS: Cardiac and mediastinal silhouettes are stable in size and contour, and remain within normal limits. Atherosclerotic disease noted within the aortic arch. Lungs are hypoinflated. Changes related COPD noted. Increased right basilar opacity favored to reflect atelectasis/bronchovascular crowding, although recurrent infiltrate not excluded. Left basilar atelectasis noted. Small left  pleural effusion. No pulmonary edema or pneumothorax. No acute osseous abnormality. IMPRESSION: 1. Shallow lung inflation with increased right basilar opacity, favored to reflect atelectasis/ bronchovascular crowding, although recurrent infiltrate could be considered in the correct clinical setting. 2. Small left pleural effusion with associated atelectasis. 3. COPD. Electronically Signed   By: Jeannine Boga M.D.   On: 04/21/2016 20:38   Dg Chest Port 1 View  Result Date: 04/21/2016 CLINICAL DATA:  Follow-up wheezing, COPD. EXAM: PORTABLE CHEST 1 VIEW COMPARISON:  04/20/2016 FINDINGS: There is hyperinflation of the lungs compatible with COPD.  Heart and mediastinal contours are within normal limits. No focal opacities or effusions. No acute bony abnormality. IMPRESSION: COPD.  No active disease. Electronically Signed   By: Rolm Baptise M.D.   On: 04/21/2016 08:27   Dg Chest Port 1 View  Result Date: 04/20/2016 CLINICAL DATA:  Pneumonia. EXAM: PORTABLE CHEST 1 VIEW COMPARISON:  04/16/2016. FINDINGS: Mediastinum and hilar structures are normal. Heart size stable. Low lung volumes with basilar atelectasis. Slight improvement of right base infiltrate. A component of right base bronchiectasis cannot be excluded. Tiny left pleural effusion. No pneumothorax. IMPRESSION: 1. Interim partial clearing of right base infiltrate. A component of bronchiectasis in the right base cannot be excluded. 2. Low lung volumes.  Small left pleural effusion noted. Electronically Signed   By: Marcello Moores  Register   On: 04/20/2016 07:20   Dg Chest Port 1 View  Result Date: 04/16/2016 CLINICAL DATA:  Acute onset of shortness of breath. Initial encounter. EXAM: PORTABLE CHEST 1 VIEW COMPARISON:  Chest radiograph performed 04/14/2016 FINDINGS: Persistent right basilar airspace opacity remains concerning for pneumonia. No definite pleural effusion or pneumothorax is seen. Mild vascular congestion is noted. The cardiomediastinal  silhouette is mildly enlarged. No acute osseous abnormalities are identified. IMPRESSION: 1. Persistent right basilar airspace opacity remains concerning for pneumonia. 2. Mild vascular congestion and mild cardiomegaly. Electronically Signed   By: Garald Balding M.D.   On: 04/16/2016 06:26   Dg Chest Port 1 View  Result Date: 04/14/2016 CLINICAL DATA:  Acute onset of shortness of breath and cough. Initial encounter. EXAM: PORTABLE CHEST 1 VIEW COMPARISON:  Chest radiograph performed 04/11/2016 FINDINGS: Right basilar airspace opacity raises concern for pneumonia. No pleural effusion or pneumothorax is seen. The cardiomediastinal silhouette is borderline normal in size. No acute osseous abnormalities are identified. IMPRESSION: Right basilar pneumonia noted. Electronically Signed   By: Garald Balding M.D.   On: 04/14/2016 21:50   Dg Chest Port 1 View  Result Date: 04/11/2016 CLINICAL DATA:  82 year old female with respiratory failure EXAM: PORTABLE CHEST 1 VIEW COMPARISON:  Prior chest x-ray 04/09/2016 FINDINGS: The patient has been extubated and the nasogastric tube removed. Inspiratory volumes are lower. Nonspecific patchy opacities in the right greater than left base. Stable cardiomegaly. Mild pulmonary vascular congestion without edema. Atherosclerotic, ectatic and tortuous thoracic aorta. Right IJ approach central venous catheter remains in unchanged position. The tip of the catheter is at the superior cavoatrial junction. Osseous structures are intact and unremarkable. IMPRESSION: 1. Interval extubation and removal of nasogastric tube. Right IJ central venous catheter remains in stable and satisfactory position. 2. Lower inspiratory volumes with increased bibasilar atelectasis and pulmonary vascular congestion. 3.  Aortic Atherosclerosis (ICD10-170.0) Electronically Signed   By: Jacqulynn Cadet M.D.   On: 04/11/2016 08:12   Dg Chest Port 1 View  Result Date: 04/10/2016 CLINICAL DATA:   Endotracheal tube and orogastric tube placement. Initial encounter. EXAM: PORTABLE CHEST 1 VIEW COMPARISON:  None. FINDINGS: The patient's endotracheal tube is seen ending 2 cm above the carina. A right IJ line is noted ending about the distal SVC. An enteric tube is noted extending below the diaphragm. Mild bibasilar atelectasis is noted. No pleural effusion or pneumothorax is seen. The cardiomediastinal silhouette is borderline normal in size. No acute osseous abnormalities are identified. IMPRESSION: 1. Endotracheal tube seen ending 2 cm above the carina. 2. Right IJ line noted ending about the distal SVC. 3. Enteric tube noted extending below the diaphragm. 4. Mild bibasilar atelectasis. Electronically Signed   By:  Garald Balding M.D.   On: 04/10/2016 00:02   Dg Abd Portable 1v  Result Date: 04/09/2016 CLINICAL DATA:  82 y/o  F; OG tube placement. EXAM: PORTABLE ABDOMEN - 1 VIEW COMPARISON:  None. FINDINGS: Patient is rotated. Normal bowel gas pattern. Enteric tube tip is below the diaphragm probably in the distal stomach. Slight densities projecting over the kidneys bilaterally may represent stones. Degenerative changes of the spine. IMPRESSION: Enteric tube tip probably in distal stomach. Densities projecting over kidneys may represent nephrolithiasis. Electronically Signed   By: Kristine Garbe M.D.   On: 04/09/2016 23:56   Dg Swallowing Func-speech Pathology  Result Date: 04/17/2016 Objective Swallowing Evaluation: Type of Study: MBS-Modified Barium Swallow Study Patient Details Name: Catherine Day MRN: [id number] Date of Birth: [birth year] Today's Date: 04/17/2016 Time: SLP Start Time (ACUTE ONLY): 1045-SLP Stop Time (ACUTE ONLY): 1115 SLP Time Calculation (min) (ACUTE ONLY): 30 min Past Medical History: Past Medical History: Diagnosis Date . COPD (chronic obstructive pulmonary disease) ([place])  Past Surgical History: Past Surgical History: Procedure Laterality Date . IR GENERIC HISTORICAL   04/09/2016  IR ANGIO INTRA EXTRACRAN SEL COM CAROTID INNOMINATE UNI R MOD SED 04/09/2016 Luanne Bras, MD [facility] . IR GENERIC HISTORICAL  04/09/2016  IR PERCUTANEOUS ART THROMBECTOMY/INFUSION INTRACRANIAL INC DIAG ANGIO 04/09/2016 Luanne Bras, MD [facility] . PELVIC FRACTURE SURGERY   . RADIOLOGY WITH ANESTHESIA N/A 04/09/2016  Procedure: RADIOLOGY WITH ANESTHESIA;  Surgeon: Luanne Bras, MD;  Location: [facility];  Service: Radiology;  Laterality: N/A; . TEE WITHOUT CARDIOVERSION N/A 04/13/2016  Procedure: TRANSESOPHAGEAL ECHOCARDIOGRAM (TEE);  Surgeon: Sanda Klein, MD;  Location: [facility] ENDOSCOPY;  Service: Cardiovascular;  Laterality: N/A; HPI: 82 y.o. right handed female with h/o COPD. Presented 04/10/2016 with sudden onset of right-sided weakness and inability to speak. 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 Pt discharged to [facility] 11/28, however, later that night she became dyspneic and diaphoretic. She was given albuterol nebs with some improvement. CXR with RLL infiltrate. Subjective: pt awake, pleasant Assessment / Plan / Recommendation CHL IP CLINICAL IMPRESSIONS 04/17/2016 Therapy Diagnosis Mild oral phase dysphagia;Mild pharyngeal phase dysphagia Clinical Impression Pt's swallow function demonstrates mild improvements since [facility] 11/26.  She presents with piecemeal oral bolusing; persisting delay in initiation (valleculae and pyriforms) -however, this is not necessarily considered disordered in the elderly population.  There may have been questionable, high penetration of liquids, but it was difficult to discern.  No aspiration. Pyriform sinuses appear to be asymmetric and there is mild residue remaining post-swallow. Recommend advancing diet to dysphagia 3, nectar-thick liquids for now.  Will follow for functional toleration/safety.   Impact on  safety and function Mild aspiration risk   CHL IP TREATMENT RECOMMENDATION 04/17/2016 Treatment Recommendations Therapy as outlined in treatment plan below   Prognosis 04/17/2016 Prognosis for Safe Diet Advancement Good Barriers to Reach Goals -- Barriers/Prognosis Comment -- CHL IP DIET RECOMMENDATION 04/17/2016 SLP Diet Recommendations Dysphagia 3 (Mech soft) solids;Nectar thick liquid Liquid Administration via Cup Medication Administration Whole meds with puree Compensations Small sips/bites;Slow rate Postural Changes --   CHL IP OTHER RECOMMENDATIONS 04/17/2016 Recommended Consults -- Oral Care Recommendations Oral care BID Other Recommendations --   CHL IP FOLLOW UP RECOMMENDATIONS 04/17/2016 Follow up Recommendations Inpatient Rehab   CHL IP FREQUENCY AND DURATION 04/17/2016 Speech Therapy Frequency (ACUTE ONLY) min 2x/week Treatment Duration 2 weeks  CHL IP ORAL PHASE 04/17/2016 Oral Phase Impaired Oral - Pudding Teaspoon -- Oral - Pudding Cup -- Oral - Honey Teaspoon -- Oral - Honey Cup -- Oral - Nectar Teaspoon -- Oral - Nectar Cup -- Oral - Nectar Straw -- Oral - Thin Teaspoon -- Oral - Thin Cup -- Oral - Thin Straw -- Oral - Puree Piecemeal swallowing Oral - Mech Soft Piecemeal swallowing Oral - Regular -- Oral - Multi-Consistency -- Oral - Pill -- Oral Phase - Comment --  CHL IP PHARYNGEAL PHASE 04/17/2016 Pharyngeal Phase Impaired Pharyngeal- Pudding Teaspoon -- Pharyngeal -- Pharyngeal- Pudding Cup -- Pharyngeal -- Pharyngeal- Honey Teaspoon -- Pharyngeal -- Pharyngeal- Honey Cup NT Pharyngeal -- Pharyngeal- Nectar Teaspoon -- Pharyngeal -- Pharyngeal- Nectar Cup Pharyngeal residue - pyriform;Delayed swallow initiation-pyriform sinuses Pharyngeal Material enters airway, remains ABOVE vocal cords then ejected out Pharyngeal- Nectar Straw -- Pharyngeal -- Pharyngeal- Thin Teaspoon -- Pharyngeal -- Pharyngeal- Thin Cup Delayed swallow initiation-pyriform sinuses;Pharyngeal residue - pyriform Pharyngeal  Material enters airway, remains ABOVE vocal cords then ejected out Pharyngeal- Thin Straw -- Pharyngeal -- Pharyngeal- Puree Delayed swallow initiation-vallecula;Pharyngeal residue - pyriform Pharyngeal -- Pharyngeal- Mechanical Soft Delayed swallow initiation-vallecula;Pharyngeal residue - pyriform Pharyngeal -- Pharyngeal- Regular -- Pharyngeal -- Pharyngeal- Multi-consistency -- Pharyngeal -- Pharyngeal- Pill -- Pharyngeal -- Pharyngeal Comment --  CHL IP CERVICAL ESOPHAGEAL PHASE 04/12/2016 Cervical Esophageal Phase WFL Pudding Teaspoon -- Pudding Cup -- Honey Teaspoon -- Honey Cup -- Nectar Teaspoon -- Nectar Cup -- Nectar Straw -- Thin Teaspoon -- Thin Cup -- Thin Straw -- Puree -- Mechanical Soft -- Regular -- Multi-consistency -- Pill -- Cervical Esophageal Comment -- No flowsheet data found. Juan Quam Laurice 04/17/2016, 11:50 AM              Dg Swallowing Func-speech Pathology  Result Date: 04/12/2016 Objective Swallowing Evaluation: Type of Study: MBS-Modified Barium Swallow Study Patient Details Name: Catherine Day MRN: [id number] Date of Birth: 1927/11/16 Today's Date: 04/12/2016 Time: SLP Start Time (ACUTE ONLY): 1116-SLP Stop Time (ACUTE ONLY): 1131 SLP Time Calculation (min) (ACUTE ONLY): 15 min Past Medical History: Past Medical History: Diagnosis Date . COPD (chronic obstructive pulmonary disease) ([place])  Past Surgical History: Past Surgical History: Procedure Laterality Date . PELVIC FRACTURE SURGERY   . RADIOLOGY WITH ANESTHESIA N/A 04/09/2016  Procedure: RADIOLOGY WITH ANESTHESIA;  Surgeon: Luanne Bras, MD;  Location: [place];  Service: Radiology;  Laterality: N/A; HPI: Ms Fifield is an 82 yo female with h/o COPD admitted with mental status change, right sided weakness, hemianopsia, left gaze preference,  Pt required intubation and was extubated yesterday.  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   Swallow and speech eval ordered Subjective: pt awake, pleasant Assessment / Plan / Recommendation CHL IP CLINICAL IMPRESSIONS 04/12/2016 Therapy Diagnosis Moderate pharyngeal phase dysphagia Clinical Impression Pt has a moderate pharyngeal dysphagia due in large part to delayed swallow initiation and decreased sensation of airway penetration. Liquids spill to the pyriform sinuses, resulting in deep, silent penetration of thin and nectar thick liquids. Although this does not occur with every bolus, it is silent and unable to be completely cleared with cued coughing, making her at a higher risk for an aspiration-related infection. Her strength is adequate with no significant residuals remaining post-swallow. Recommend Dys 3 diet and honey thick liquids. SLP to f/u for tolerance and readiness to advance. Impact on safety and function Moderate aspiration risk   CHL IP  TREATMENT RECOMMENDATION 04/12/2016 Treatment Recommendations Therapy as outlined in treatment plan below   Prognosis 04/12/2016 Prognosis for Safe Diet Advancement Good Barriers to Reach Goals -- Barriers/Prognosis Comment -- CHL IP DIET RECOMMENDATION 04/12/2016 SLP Diet Recommendations Dysphagia 3 (Mech soft) solids;Honey thick liquids Liquid Administration via Cup;No straw Medication Administration Whole meds with puree Compensations Slow rate;Small sips/bites Postural Changes Remain semi-upright after after feeds/meals (Comment);Seated upright at 90 degrees   CHL IP OTHER RECOMMENDATIONS 04/12/2016 Recommended Consults -- Oral Care Recommendations Oral care BID Other Recommendations Order thickener from pharmacy;Prohibited food (jello, ice cream, thin soups);Remove water pitcher   CHL IP FOLLOW UP RECOMMENDATIONS 04/12/2016 Follow up Recommendations Inpatient Rehab   CHL IP FREQUENCY AND DURATION 04/12/2016 Speech Therapy Frequency (ACUTE ONLY) min 2x/week Treatment Duration 2 weeks      CHL IP ORAL PHASE 04/12/2016 Oral Phase WFL Oral - Pudding  Teaspoon -- Oral - Pudding Cup -- Oral - Honey Teaspoon -- Oral - Honey Cup -- Oral - Nectar Teaspoon -- Oral - Nectar Cup -- Oral - Nectar Straw -- Oral - Thin Teaspoon -- Oral - Thin Cup -- Oral - Thin Straw -- Oral - Puree -- Oral - Mech Soft -- Oral - Regular -- Oral - Multi-Consistency -- Oral - Pill -- Oral Phase - Comment --  CHL IP PHARYNGEAL PHASE 04/12/2016 Pharyngeal Phase Impaired Pharyngeal- Pudding Teaspoon -- Pharyngeal -- Pharyngeal- Pudding Cup -- Pharyngeal -- Pharyngeal- Honey Teaspoon -- Pharyngeal -- Pharyngeal- Honey Cup Delayed swallow initiation-pyriform sinuses Pharyngeal -- Pharyngeal- Nectar Teaspoon -- Pharyngeal -- Pharyngeal- Nectar Cup Delayed swallow initiation-pyriform sinuses;Penetration/Aspiration before swallow Pharyngeal Material enters airway, remains ABOVE vocal cords and not ejected out Pharyngeal- Nectar Straw -- Pharyngeal -- Pharyngeal- Thin Teaspoon -- Pharyngeal -- Pharyngeal- Thin Cup Delayed swallow initiation-pyriform sinuses;Penetration/Aspiration before swallow Pharyngeal Material enters airway, CONTACTS cords and not ejected out Pharyngeal- Thin Straw -- Pharyngeal -- Pharyngeal- Puree Delayed swallow initiation-vallecula Pharyngeal -- Pharyngeal- Mechanical Soft Delayed swallow initiation-vallecula Pharyngeal -- Pharyngeal- Regular -- Pharyngeal -- Pharyngeal- Multi-consistency -- Pharyngeal -- Pharyngeal- Pill -- Pharyngeal -- Pharyngeal Comment --  CHL IP CERVICAL ESOPHAGEAL PHASE 04/12/2016 Cervical Esophageal Phase WFL Pudding Teaspoon -- Pudding Cup -- Honey Teaspoon -- Honey Cup -- Nectar Teaspoon -- Nectar Cup -- Nectar Straw -- Thin Teaspoon -- Thin Cup -- Thin Straw -- Puree -- Mechanical Soft -- Regular -- Multi-consistency -- Pill -- Cervical Esophageal Comment -- No flowsheet data found. Germain Osgood 04/12/2016, 1:05 PM  Germain Osgood, M.A. CCC-SLP [phone]             Ir Percutaneous Art Thrombectomy/infusion Intracranial Inc Diag  Angio  Result Date: 04/13/2016 CLINICAL DATA:  Right-sided hemiplegia, left gaze preference, confusion, global aphasia. EXAM: IR PERCUTANEOUS ART THORMBECTOMY/INFUSION INTRACRANIAL INCLUDE DIAG ANGIO; IR ANGIO INTRA EXTRACRAN SEL COM CAROTID INNOMINATE UNI RIGHT MOD SED PROCEDURE: Following a full explanation of the procedure along with the potential associated complications, an informed witnessed consent was obtained. Risks of intracranial hemorrhage of 10-15%, worsening neurological deficit, death, and the inability to revascularize were all reviewed in detail with the patient's daughter. Informed consent was obtained. The patient was then put under general anesthesia by the Department of Anesthesiology at [facility]. The right groin was prepped and draped in the usual sterile fashion. Thereafter using modified Seldinger technique, transfemoral access into the right common femoral artery was obtained without difficulty. Over a 0.035 inch guidewire a 5 French Pinnacle sheath was inserted. Through this, and also over a 0.035 inch guidewire  a 5 [place] JB 1 catheter was advanced to the aortic arch region and selectively positioned in the left common carotid artery and later in the right common carotid artery. There were no acute complications. The patient tolerated the procedure well. Contrast: Isovue 300 approximately 70 mL. Anesthesia/Sedation:  General anesthesia. Medications: As per general anesthesia. FINDINGS: The right common carotid arteriogram demonstrates moderate to severe tortuosity of the left common carotid artery in its proximal portion with acute angular takeoff towards the aortic arch. Slow flow was seen ascending to the left internal carotid artery. There was no angiographic visualization of the left external carotid artery consistent with occlusion. The left internal carotid artery at the bulb demonstrated no occlusions with wide patency. 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 The right common carotid arteriogram demonstrates the right external carotid artery and its major branches to be widely patent. The right internal carotid at the bulb and just distally appears widely patent. Focal areas of focal outpouching are seen to emanate from the posterior wall of the right internal carotid artery in the proximal one-third. Distal to this there is a double U shaped tortuosity of the mid left internal carotid artery. Distal to this the left internal carotid artery is seen to opacify normally to the cranial skull base. There is mild fusiform prominence of the petrous cavernous junction extending into the proximal cavernous artery. The distal cavernous carotid and supraclinoid segments are widely patent. There is a mild focal bulge noted at the level of the right posterior communicating artery. The right middle cerebral artery and the right anterior cerebral artery opacify normally into the capillary and venous phases. ENDOVASCULAR COMPLETE REVASCULARIZATION OF OCCLUDED LEFT INTERNAL CAROTID ARTERY TERMINUS, THE LEFT MIDDLE CEREBRAL ARTERY AND LEFT ANTERIOR CEREBRAL ARTERY. The diagnostic JB 1 catheter in the left common carotid artery was then exchanged over a 0.035 inch 300 cm Rosen exchange guidewire for a 8 French 55 cm Brite tip neurovascular sheath using biplane roadmap technique and constant fluoroscopic guidance. Good aspiration obtained from the side port of the neurovascular sheath. This was then connected to continuous heparinized saline infusion. Over the [facility] exchange guidewire the distal end of which was at the origin of the left external carotid artery an 8 French 85 cm FlowGate balloon guide catheter which had been prepped with 50% contrast and 50% heparinized saline infusion was then advanced to just the origin of left internal carotid artery. The  guidewire was removed. Good aspiration was obtained from the hub of the 8 [place] FlowGate guide catheter. Gentle contrast injection demonstrated no evidence of dissections or of intraluminal filling defects. Over a 0.014 inch Softip Synchro micro guidewire, a 021 Trevo ProVue micro catheter was then advanced to just distal to the origin of the 85 cm 8 [place] FlowGate guide catheter. The micro catheter and micro guidewire were advanced without difficulty to the cranial skull base. However, further advancement of the micro catheter was met with herniation of the entire platform into the aortic arch. The entire system was then removed and replaced with a 8 French 80 cm Arrow neurovascular sheath which was positioned over an exchange micro guidewire just proximal to the origin of the left internal carotid artery. Over the [employer] guidewire, the 8 [place] 80 cm Arrow neurovascular sheath, the 8 French 85 cm FlowGate guide catheter was then advanced and positioned just proximal  to the left internal carotid artery origin. The guidewire was removed. Good aspiration obtained from the hub of the 8 [place] FlowGate guide catheter. Gentle contrast injection demonstrated no evidence of spasms, dissections or of intraluminal filling defects. 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 Over a 0.014 inch Softip Synchro micro guidewire which had a J-tip configuration, the combination of an intermediary 6 French 132 cm Catalyst guide catheter inside of which was a Trevo ProVue micro catheter, the combination was advanced without difficulty to the supraclinoid left ICA. The micro guidewire was then gently advanced through the occluded left middle cerebral artery into the M2 M3 regions of the inferior division followed by the micro catheter and also followed by the 6 French 32 cm Catalyst guide catheter into the proximal left M1  segment. The micro guidewire was then removed. Aspiration was obtained from the hub of the micro catheter. Approximately 3.6 mg of arterial Integrilin were infused 1.8 mg through the micro catheter distally, and 1.8 mg through the 6 [place] Catalyst guide catheter. At this time, a 4 mm x 40 mm Solitaire FR retrieval device which had been prepped and purged with heparinized saline infusion was advanced to the distal end of the micro catheter in a coaxial manner and with constant heparinized saline infusion. The O rings on delivery micro catheter were then loosened. With slight forward gentle traction with the right hand on the delivery micro guidewire, with the left hand the delivery micro catheter was retrieved until it had been deployed. At this time, the 6 [place] Catalyst 132 cm guide catheter was advanced to just inside the proximal portion of the retrieval device. The occlusion balloon at the origin of the left internal carotid artery was then expanded. Also a 20 mL syringe was hooked to the hub of the 6 [place] Catalyst guide catheter and a constant aspiration was then applied. Thereon after, the combination of the retrieval device, with the micro catheter and the 6 [place] guide catheter were then gently retrieved with constant fluoroscopic guidance and removed. Aspiration was continued with a 60 mL syringe at the hub of the 8 [place] FlowGate guide catheter which was then gently deflated. Free back bleed at the hub of the 8 [place] FlowGate guide catheter was achieved. The aspirate contained multiple focal chunks of grayish bloody thrombus. Similar thrombus was also noted within the 8 [place] Catalyst guide catheter. Clots were also noted in the interstices of the retrieval device. After having obtained free aspiration of blood at the hub of the 8 [place] FlowGate guide catheter, control arteriogram performed through the [facility] guide catheter in the left internal carotid artery demonstrated brisk antegrade flow to  the cranial skull base and into the left middle cerebral artery and left anterior cerebral artery distributions to the distal distributions. No angiographic filling defects or occlusions were seen. There was no evidence of spasm either. No mass-effect or midline shift was noted of the major vessels intracranially. The patient's hemodynamic and neurologic status remained stable. A TICI 3 reperfusion had been obtained. The 8 [place] FlowGate guide catheter and the 8 French 80 cm Arrow neurovascular sheath were then retrieved into the abdominal aorta and exchanged over a J-tiip guidewire for a 9 [place] Pinnacle sheath. This in turn was then connected to continuous heparinized saline infusion. The patient's distal pulses remained unchanged Dopplerable early compared to prior to the procedure. The right groin  appeared soft as did the left groin at the site of the applied sheath. The patient was transported to the CT scanner for postprocedural CT scan of brain. IMPRESSION: Status post endovascular complete revascularization of occluded left internal carotid artery terminus, the left middle cerebral artery and left anterior cerebral artery with 1 pass with the Solitaire 4 mm x 40 mm retrieval device in combination with aspiration of the hub of the intermediary catheter and proximal flow arrest, achieving a TICI 3 reperfusion as described above. Electronically Signed   By: Luanne Bras M.D.   On: 04/10/2016 13:15   Ct Head Code Stroke Wo Contrast`  Result Date: 04/09/2016 CLINICAL DATA:  Code stroke for right-sided weakness and loss of speech. EXAM: CT HEAD WITHOUT CONTRAST TECHNIQUE: Contiguous axial images were obtained from the base of the skull through the vertex without intravenous contrast. COMPARISON:  None. FINDINGS: Brain: No evidence of acute infarction, hemorrhage, hydrocephalus, extra-axial collection or mass lesion/mass effect. Vascular: Atherosclerotic calcification.  No hyperdense vessel. Skull:  Negative Sinuses/Orbits: Mild chronic sinusitis and left mastoiditis. Other: Results were called by telephone at the time of interpretation on 04/09/2016 at 6:41 pm to Dr. Lawana Pai, who verbally acknowledged these results. ASPECTS [facility] Stroke Program Early CT Score) - Ganglionic level infarction (caudate, lentiform nuclei, internal capsule, insula, M1-M3 cortex): 7 - Supraganglionic infarction (M4-M6 cortex): 3 Total score (0-10 with 10 being normal): 10 IMPRESSION: No acute finding. ASPECTS is 10. Electronically Signed   By: Monte Fantasia M.D.   On: 04/09/2016 18:43   Ir Angio Intra Extracran Sel Com Carotid Innominate Uni R Mod Sed  Result Date: 04/13/2016 CLINICAL DATA:  Right-sided hemiplegia, left gaze preference, confusion, global aphasia. EXAM: IR PERCUTANEOUS ART THORMBECTOMY/INFUSION INTRACRANIAL INCLUDE DIAG ANGIO; IR ANGIO INTRA EXTRACRAN SEL COM CAROTID INNOMINATE UNI RIGHT MOD SED PROCEDURE: Following a full explanation of the procedure along with the potential associated complications, an informed witnessed consent was obtained. Risks of intracranial hemorrhage of 10-15%, worsening neurological deficit, death, and the inability to revascularize were all reviewed in detail with the patient's daughter. Informed consent was obtained. The patient was then put under general anesthesia by the Department of Anesthesiology at [facility]. The right groin was prepped and draped in the usual sterile fashion. Thereafter using modified Seldinger technique, transfemoral access into the right common femoral artery was obtained without difficulty. Over a 0.035 inch guidewire a 5 French Pinnacle sheath was inserted. Through this, and also over a 0.035 inch guidewire a 5 [place] JB 1 catheter was advanced to the aortic arch region and selectively positioned in the left common carotid artery and later in the right common carotid artery. There were no acute complications. The patient tolerated the procedure  well. Contrast: Isovue 300 approximately 70 mL. Anesthesia/Sedation:  General anesthesia. Medications: As per general anesthesia. FINDINGS: The right common carotid arteriogram demonstrates moderate to severe tortuosity of the left common carotid artery in its proximal portion with acute angular takeoff towards the aortic arch. Slow flow was seen ascending to the left internal carotid artery. There was no angiographic visualization of the left external carotid artery consistent with occlusion. The left internal carotid artery at the bulb demonstrated no occlusions with wide patency. 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 The right common carotid arteriogram demonstrates the right external carotid artery and its major branches to be widely patent. The  right internal carotid at the bulb and just distally appears widely patent. Focal areas of focal outpouching are seen to emanate from the posterior wall of the right internal carotid artery in the proximal one-third. Distal to this there is a double U shaped tortuosity of the mid left internal carotid artery. Distal to this the left internal carotid artery is seen to opacify normally to the cranial skull base. There is mild fusiform prominence of the petrous cavernous junction extending into the proximal cavernous artery. The distal cavernous carotid and supraclinoid segments are widely patent. There is a mild focal bulge noted at the level of the right posterior communicating artery. The right middle cerebral artery and the right anterior cerebral artery opacify normally into the capillary and venous phases. ENDOVASCULAR COMPLETE REVASCULARIZATION OF OCCLUDED LEFT INTERNAL CAROTID ARTERY TERMINUS, THE LEFT MIDDLE CEREBRAL ARTERY AND LEFT ANTERIOR CEREBRAL ARTERY. The diagnostic JB 1 catheter in the left common carotid artery was then  exchanged over a 0.035 inch 300 cm Rosen exchange guidewire for a 8 French 55 cm Brite tip neurovascular sheath using biplane roadmap technique and constant fluoroscopic guidance. Good aspiration obtained from the side port of the neurovascular sheath. This was then connected to continuous heparinized saline infusion. Over the [facility] exchange guidewire the distal end of which was at the origin of the left external carotid artery an 8 French 85 cm FlowGate balloon guide catheter which had been prepped with 50% contrast and 50% heparinized saline infusion was then advanced to just the origin of left internal carotid artery. The guidewire was removed. Good aspiration was obtained from the hub of the 8 [place] FlowGate guide catheter. Gentle contrast injection demonstrated no evidence of dissections or of intraluminal filling defects. Over a 0.014 inch Softip Synchro micro guidewire, a 021 Trevo ProVue micro catheter was then advanced to just distal to the origin of the 85 cm 8 [place] FlowGate guide catheter. The micro catheter and micro guidewire were advanced without difficulty to the cranial skull base. However, further advancement of the micro catheter was met with herniation of the entire platform into the aortic arch. The entire system was then removed and replaced with a 8 French 80 cm Arrow neurovascular sheath which was positioned over an exchange micro guidewire just proximal to the origin of the left internal carotid artery. Over the [employer] guidewire, the 8 [place] 80 cm Arrow neurovascular sheath, the 8 [place] 85 cm FlowGate guide catheter was then advanced and positioned just proximal to the left internal carotid artery origin. The guidewire was removed. Good aspiration obtained from the hub of the 8 [place] FlowGate guide catheter. Gentle contrast injection demonstrated no evidence of spasms, dissections or of intraluminal filling defects. 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 Over a 0.014 inch Softip Synchro micro guidewire which had a J-tip configuration, the combination of an intermediary 6 [place] 132 cm Catalyst guide catheter inside of which was a Trevo ProVue micro catheter, the combination was advanced without difficulty to the supraclinoid left ICA. The micro guidewire was then gently advanced through the occluded left middle cerebral artery into the M2 M3 regions of the inferior division followed by the micro catheter and also followed by the 6 [place] 32 cm Catalyst guide catheter into the proximal left M1 segment. The micro guidewire was then removed. Aspiration was obtained from the hub of the micro catheter. Approximately 3.6 mg  of arterial Integrilin were infused 1.8 mg through the micro catheter distally, and 1.8 mg through the 6 [place] Catalyst guide catheter. At this time, a 4 mm x 40 mm Solitaire FR retrieval device which had been prepped and purged with heparinized saline infusion was advanced to the distal end of the micro catheter in a coaxial manner and with constant heparinized saline infusion. The O rings on delivery micro catheter were then loosened. With slight forward gentle traction with the right hand on the delivery micro guidewire, with the left hand the delivery micro catheter was retrieved until it had been deployed. At this time, the 6 [place] Catalyst 132 cm guide catheter was advanced to just inside the proximal portion of the retrieval device. The occlusion balloon at the origin of the left internal carotid artery was then expanded. Also a 20 mL syringe was hooked to the hub of the 6 [place] Catalyst guide catheter and a constant aspiration was then applied. Thereon after, the combination of the retrieval device, with the micro catheter and the 6 [place] guide catheter were then gently retrieved with constant fluoroscopic guidance and removed. Aspiration was continued with a  60 mL syringe at the hub of the 8 [place] FlowGate guide catheter which was then gently deflated. Free back bleed at the hub of the 8 [place] FlowGate guide catheter was achieved. The aspirate contained multiple focal chunks of grayish bloody thrombus. Similar thrombus was also noted within the 8 [place] Catalyst guide catheter. Clots were also noted in the interstices of the retrieval device. After having obtained free aspiration of blood at the hub of the 8 [place] FlowGate guide catheter, control arteriogram performed through the [facility] guide catheter in the left internal carotid artery demonstrated brisk antegrade flow to the cranial skull base and into the left middle cerebral artery and left anterior cerebral artery distributions to the distal distributions. No angiographic filling defects or occlusions were seen. There was no evidence of spasm either. No mass-effect or midline shift was noted of the major vessels intracranially. The patient's hemodynamic and neurologic status remained stable. A TICI 3 reperfusion had been obtained. The 8 [place] FlowGate guide catheter and the 8 [place] 80 cm Arrow neurovascular sheath were then retrieved into the abdominal aorta and exchanged over a J-tiip guidewire for a 9 [place] Pinnacle sheath. This in turn was then connected to continuous heparinized saline infusion. The patient's distal pulses remained unchanged Dopplerable early compared to prior to the procedure. The right groin appeared soft as did the left groin at the site of the applied sheath. The patient was transported to the CT scanner for postprocedural CT scan of brain. IMPRESSION: Status post endovascular complete revascularization of occluded left internal carotid artery terminus, the left middle cerebral artery and left anterior cerebral artery with 1 pass with the Solitaire 4 mm x 40 mm retrieval device in combination with aspiration of the hub of the intermediary catheter and proximal flow arrest, achieving  a TICI 3 reperfusion as described above. Electronically Signed   By: Luanne Bras M.D.   On: 04/10/2016 13:15    Montrell Cessna, DO  [facility] Pager [phone]  If 7PM-7AM, please contact night-coverage [email or web address] Password [email or web address] 04/22/2016, 11:16 AM   LOS: 7 days

## 2016-04-23 ENCOUNTER — Inpatient Hospital Stay (HOSPITAL_COMMUNITY): Payer: Medicare Other

## 2016-04-23 DIAGNOSIS — G9340 Encephalopathy, unspecified: Secondary | ICD-10-CM

## 2016-04-23 DIAGNOSIS — R609 Edema, unspecified: Secondary | ICD-10-CM

## 2016-04-23 DIAGNOSIS — J9621 Acute and chronic respiratory failure with hypoxia: Secondary | ICD-10-CM

## 2016-04-23 DIAGNOSIS — I509 Heart failure, unspecified: Secondary | ICD-10-CM

## 2016-04-23 DIAGNOSIS — Z1619 Resistance to other specified beta lactam antibiotics: Secondary | ICD-10-CM

## 2016-04-23 DIAGNOSIS — M7989 Other specified soft tissue disorders: Secondary | ICD-10-CM

## 2016-04-23 LAB — URINALYSIS, ROUTINE W REFLEX MICROSCOPIC
Bilirubin Urine: NEGATIVE
Glucose, UA: NEGATIVE mg/dL
KETONES UR: NEGATIVE mg/dL
Nitrite: NEGATIVE
PROTEIN: NEGATIVE mg/dL
SQUAMOUS EPITHELIAL / LPF: NONE SEEN
Specific Gravity, Urine: 1.005 (ref 1.005–1.030)
pH: 7 (ref 5.0–8.0)

## 2016-04-23 LAB — CBC
HEMATOCRIT: 35.9 % — AB (ref 36.0–46.0)
Hemoglobin: 11.2 g/dL — ABNORMAL LOW (ref 12.0–15.0)
MCH: 27.2 pg (ref 26.0–34.0)
MCHC: 31.2 g/dL (ref 30.0–36.0)
MCV: 87.1 fL (ref 78.0–100.0)
PLATELETS: 315 10*3/uL (ref 150–400)
RBC: 4.12 MIL/uL (ref 3.87–5.11)
RDW: 15.8 % — AB (ref 11.5–15.5)
WBC: 11.8 10*3/uL — ABNORMAL HIGH (ref 4.0–10.5)

## 2016-04-23 LAB — BASIC METABOLIC PANEL
Anion gap: 14 (ref 5–15)
BUN: 27 mg/dL — AB (ref 6–20)
CHLORIDE: 96 mmol/L — AB (ref 101–111)
CO2: 29 mmol/L (ref 22–32)
CREATININE: 0.88 mg/dL (ref 0.44–1.00)
Calcium: 8.5 mg/dL — ABNORMAL LOW (ref 8.9–10.3)
GFR calc Af Amer: >60 mL/min (ref >60)
GFR calc non Af Amer: 58 mL/min — ABNORMAL LOW (ref >60)
GLUCOSE: 104 mg/dL — AB (ref 65–99)
POTASSIUM: 3.7 mmol/L (ref 3.5–5.1)
SODIUM: 139 mmol/L (ref 135–145)

## 2016-04-23 LAB — MAGNESIUM: Magnesium: 2 mg/dL (ref 1.7–2.4)

## 2016-04-23 MED ORDER — FUROSEMIDE 10 MG/ML IJ SOLN
20.0000 mg | Freq: Once | INTRAMUSCULAR | Status: AC
  Administered 2016-04-23: 20 mg via INTRAVENOUS
  Filled 2016-04-23: qty 2

## 2016-04-23 MED ORDER — LORAZEPAM 2 MG/ML IJ SOLN
1.0000 mg | Freq: Once | INTRAMUSCULAR | Status: AC
  Administered 2016-04-23: 1 mg via INTRAVENOUS
  Filled 2016-04-23: qty 1

## 2016-04-23 MED ORDER — FUROSEMIDE 40 MG PO TABS: 40.0000 mg | ORAL_TABLET | Freq: Every day | ORAL | Status: DC

## 2016-04-23 MED ORDER — ALBUTEROL SULFATE (2.5 MG/3ML) 0.083% IN NEBU
2.5000 mg | INHALATION_SOLUTION | RESPIRATORY_TRACT | Status: AC
  Administered 2016-04-24: 2.5 mg via RESPIRATORY_TRACT
  Filled 2016-04-23: qty 3

## 2016-04-23 MED ORDER — HALOPERIDOL LACTATE 5 MG/ML IJ SOLN
1.0000 mg | Freq: Four times a day (QID) | INTRAMUSCULAR | Status: DC | PRN
  Administered 2016-04-24 – 2016-04-26 (×3): 1 mg via INTRAVENOUS
  Filled 2016-04-23 (×3): qty 1

## 2016-04-23 MED ORDER — ASPIRIN 300 MG RE SUPP
300.0000 mg | Freq: Every day | RECTAL | Status: DC
  Administered 2016-04-23 – 2016-04-27 (×5): 300 mg via RECTAL
  Filled 2016-04-23 (×5): qty 1

## 2016-04-23 MED ORDER — FAMOTIDINE IN NACL 20-0.9 MG/50ML-% IV SOLN
20.0000 mg | Freq: Two times a day (BID) | INTRAVENOUS | Status: DC
  Administered 2016-04-23 (×2): 20 mg via INTRAVENOUS
  Filled 2016-04-23 (×4): qty 50

## 2016-04-23 NOTE — Progress Notes (Signed)
PT Cancellation Note  Patient Details Name: Catherine ClossRegina Day MRN: [id number] DOB: 04/03/1928   Cancelled Treatment:     Attempted to tx patient remains sleeping due to medications.  PTA assisted respiratory therapist in repositioning for breathing treatment. Will f/u per POC when patient is alert.     Chandelle Harkey Artis DelayJ Mliss Wedin 04/23/2016, 2:31 PM Joycelyn RuaAimee Marigene Erler, PTA pager [phone]

## 2016-04-23 NOTE — Progress Notes (Signed)
Pharmacy Antibiotic Note  Catherine Day is a 82 y.o. female admitted on 04/15/2016 with HCAP and ESBL Ecoli UTI.  Patient is s/p 5 days of vanc/zosyn for HCAP and pharmacy continuing to follow for meropenem dosing.  SCr 0.88 (CrCL ~40-4545ml/min). Antibiotic to stop tomorrow.  Plan: - Continue meropenem 1g IV q8h with stop date of 12/8 - Monitor repeat urine cx, renal function - Pharmacy to sign off as LOT addressed and no dose adjustments anticipated    Height: 5\' 2"  (157.5 cm) Weight: 166 lb 8 oz (75.5 kg) IBW/kg (Calculated) : 50.1  Temp (24hrs), Avg:97.4 F (36.3 C), Min:97.4 F (36.3 C), Max:97.5 F (36.4 C)   Recent Labs Lab 04/18/16 0241 04/20/16 0200 04/21/16 0523 04/22/16 0617 04/23/16 0442  WBC  --   --  12.8* 15.4* 11.8*  CREATININE 0.80 0.80 0.71 0.90 0.88    Estimated Creatinine Clearance: 43.7 mL/min (by C-G formula based on SCr of 0.88 mg/dL).    No Known Allergies  Antimicrobials this admission: 11/29 vanc >> 12/2 11/29 zosyn >> 12/2 12/2 meropenem >> (12/8)  Dose adjustments this admission: N/A  Microbiology results: 11/29 blood x2 - negative MRSA PCR- neg 11/29 urine - ESBL Ecoli (40K/ml) 11/30 Urine: neg 12/7 Urine: sent  Catherine Day, PharmD, BCPS Clinical Pharmacist Pager: [phone] 04/23/2016 1:46 PM

## 2016-04-23 NOTE — NC FL2 (Signed)
[place] MEDICAID FL2 LEVEL OF CARE SCREENING TOOL     IDENTIFICATION  Patient Name: Catherine ClossRegina Gal Birthdate: 01/24/1928 Sex: female Admission Date (Current Location): 04/15/2016  [facility] [place]   Facility and Address:  [facility], [address], [place], [place] [address]      Provider Number: [id number]  Attending Physician Name and Address:  Catarina Hartshornavid Tat, MD  Relative Name and Phone Number:       Current Level of Care: Hospital Recommended Level of Care: Skilled Nursing Facility Prior Approval Number:    Date Approved/Denied:   PASRR Number: [id number]  Discharge Plan: SNF    Current Diagnoses: Patient Active Problem List   Diagnosis Date Noted  . Acute on chronic combined systolic and diastolic CHF (congestive heart failure) (HCC) 04/22/2016  . UTI due to extended-spectrum beta lactamase (ESBL) producing Escherichia coli   . Hypocalcemia   . Aspiration pneumonitis (HCC)   . Chronic systolic CHF (congestive heart failure) (HCC)   . Cerebrovascular accident (CVA) due to bilateral thrombosis of vertebral arteries (HCC)   . Aspiration pneumonia (HCC) 04/15/2016  . Hypertension 04/14/2016  . Hyperlipidemia 04/14/2016  . Extravasation injury of IV catheter site with other complication, initial encounter (HCC) L arm 04/14/2016  . 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 Dysphagia 04/14/2016  . Diastolic dysfunction   . Chronic obstructive pulmonary disease (HCC)   . Acute blood loss anemia   . Leukocytosis   . Hypokalemia   . Embolic stroke (HCC) - B MCA and L cerebellar     Orientation RESPIRATION BLADDER Height & Weight        O2 (Nasal cannula 2.5L) Incontinent, Indwelling catheter Weight: 75.5 kg (166 lb 8 oz) Height:  5\' 2"  (157.5 cm)  BEHAVIORAL SYMPTOMS/MOOD NEUROLOGICAL BOWEL NUTRITION STATUS      Continent Diet (Please see DC Summary)  AMBULATORY STATUS COMMUNICATION OF NEEDS Skin    Extensive Assist Verbally Surgical wounds (Closed incision on groin)                       Personal Care Assistance Level of Assistance  Bathing, Feeding, Dressing Bathing Assistance: Maximum assistance Feeding assistance: Limited assistance Dressing Assistance: Maximum assistance     Functional Limitations Info  Sight, Hearing, Speech Sight Info: Adequate Hearing Info: Adequate Speech Info: Adequate    SPECIAL CARE FACTORS FREQUENCY  PT (By licensed PT), OT (By licensed OT), Speech therapy     PT Frequency: 5x/week OT Frequency: 5x/week     Speech Therapy Frequency: 5x/week      Contractures Contractures Info: Not present    Additional Factors Info  Code Status, Allergies, Isolation Code Status Info: Full Allergies Info: NKA  Isolation: ESBL           Current Medications (04/23/2016):  This is the current hospital active medication list Current Facility-Administered Medications  Medication Dose Route Frequency Provider Last Rate Last Dose  . albuterol (PROVENTIL) (2.5 MG/3ML) 0.083% nebulizer solution 2.5 mg  2.5 mg Nebulization Q2H PRN Duayne CalPaul W Hoffman, NP   2.5 mg at 04/23/16 0133  . aspirin suppository 300 mg  300 mg Rectal Daily Onalee Huaavid Tat, MD      . budesonide (PULMICORT) nebulizer solution 0.25 mg  0.25 mg Nebulization BID Catarina Hartshornavid Tat, MD   0.25 mg at 04/23/16 1001  . famotidine (PEPCID) IVPB 20 mg premix  20 mg Intravenous Q12H Catarina Hartshornavid Tat, MD      .  furosemide (LASIX) injection 20 mg  20 mg Intravenous Once Onalee Huaavid Tat, MD      . heparin injection 5,000 Units  5,000 Units Subcutaneous Q8H Duayne CalPaul W Hoffman, NP   5,000 Units at 04/23/16 [phone]  . irbesartan (AVAPRO) tablet 75 mg  75 mg Oral Daily Zigmund GottronElizabeth C Deterding, MD   75 mg at 04/22/16 [phone]   And  . hydrochlorothiazide (MICROZIDE) capsule 12.5 mg  12.5 mg Oral Daily Zigmund GottronElizabeth C Deterding, MD   12.5 mg at 04/22/16 0936  . ipratropium-albuterol (DUONEB) 0.5-2.5 (3) MG/3ML nebulizer solution 3 mL  3 mL  Nebulization TID Catarina Hartshornavid Tat, MD   3 mL at 04/23/16 0957  . MEDLINE mouth rinse  15 mL Mouth Rinse BID Roslynn AmbleJennings E Nestor, MD   Stopped at 04/23/16 1101  . meropenem (MERREM) 1 g in sodium chloride 0.9 % 100 mL IVPB  1 g Intravenous Q8H Catarina Hartshornavid Tat, MD   1 g at 04/23/16 0608     Discharge Medications: Please see discharge summary for a list of discharge medications.  Relevant Imaging Results:  Relevant Lab Results:   Additional Information SSN:  [id number]  Mearl LatinNadia S Kimyah Frein, LCSWA

## 2016-04-23 NOTE — Progress Notes (Signed)
OT Cancellation Note  Patient Details Name: Catherine ClossRegina Day MRN: [id number] DOB: 04/14/1928   Cancelled Treatment:    Reason Eval/Treat Not Completed: Fatigue/lethargy limiting ability to participate;Other (comment). Per RN, pt given Ativan very early this morning and sleeping heavily. Will check back on pt later as able/as appropriate  Galen ManilaSpencer, Shastina Rua Jeanette 04/23/2016, 9:23 AM

## 2016-04-23 NOTE — Progress Notes (Signed)
Pt. Lethargic and concerned for swallow safety pt. Not following commands MD paged to verify PO med orders.

## 2016-04-23 NOTE — Progress Notes (Signed)
Dr. Clearence PedSchorr notified patient anxious, restless, pulling oxygen off. Inquiring if patient can get prn medication to help with anxiety. Awaiting response.

## 2016-04-23 NOTE — Progress Notes (Signed)
PROGRESS NOTE  Abbi Mancini LKJ:[id number] DOB: [birth year] DOA: 04/15/2016 PCP: No primary care provider on file.  Brief Narrative: 82 yo right-handed female from [place] w/ a Hxof COPD (on 2 L O2) and HLD who presented on April 10, 2016 with thesudden onset of right-sided weakness and an inability to speak. CT and MRI showed small volume scattered acute infarctsin theleft hemisphere primarily in the left temporal and occipital lobes. No associated mass effect. Small number of other scattered small bilateral MCA left cerebellar artery territory infarcts. The patient did receive tPA. MRI showed possible occlusion of the left PCA-P3 inferior division. Underwent bilateral common carotid arteriograms followed by complete revascularization of occluded left MCA, left ACA, left ICA terminus. Neurology consulted, maintained on aspirin therapy for CVA prophylaxis. TEE noted no intracardiac source of embolism or shunt. She was transferred to CIR, but while there she suffered worsening hypoxia and on 11/29 required transfer back to the acute hospital for aspiration pneumonitis.   Assessment & Plan: Acute on chronic respiratory failure with hypoxia -multifactorial including aspiration pneumonia, CHF in the setting of underlying COPD -stable on 3 L -add pulmicort  -flutter valve -continue duonebs q 8 hours -normally on 2 L [place] at home  Acute toxic Encephalopathy -pt falls asleep when not engaged directly; lethargic -received Ativan 1 mg IV at 0430 on 04/23/16 for agitation -minimize benzo; haldol 1 mg IV q 6 hrs prn agitation -switch essential meds to parenteral -04/23/16-personally reviwed CXR--R- basilar opacity, small left effusion--pt already on merrem  Aspiration pneumonia / pneumonitis - completed7 days antibiotics - cleared for D3 diet w/ nectar liquids by SLP   PositiveESBL Escherichia coliUTI - Continue meropenem D#6 of 7  Recent CVA - s/p IV TPA and IR treatments  with R hemiparesthesia and dysphagia - hopefully can go back to CIR shortly - PT to evaluate, will likely need reconsult for inpatient rehabilitation if she still will meet criteria  Acute on Chronic Systolic and diastolic CHF - pt had some evidence of fluid overload - repeat lasix IV x 1 today -NEG 4.3 L -04/13/16 TEE--EF 45-50%, grade 1 DD, normal RV -appears previous dry weight ~170 -hold ARB due to soft BP -check UA-->6-30 WBC, but already on merrem  Hypokalemia - Replaced  Lower extremity pain and edema -venous duplex--NEG  Goals of Care -long discussion with daughter at bedside-->change to DNR -plan for SNF   DVT prophylaxis:heparin [place] Code Status:Full code Family Communication:daughter updated at bedside 12/7--Total time spent 35 minutes.  Greater than 50% spent face to face counseling and coordinating care.  Disposition Plan:CIR  12/7 or 12/8 if stable  Consultants:  PCCM  Procedures:  None   Antimicrobials: Zosyn 11/29 >12/2 Vancomycin 11/29 >12/2 Meropenem 12/2 >   Subjective: Patient is somnolent. She falls asleep when not engaged [place]. Denies any shortness of breath or pain. Remainder of review of systems unobtainable secondary to patient's encephalopathy.  Objective: Vitals:   04/23/16 0547 04/23/16 0548 04/23/16 0609 04/23/16 1355  BP: 138/73   (!) 105/44  Pulse: 82   72  Resp: 20   15  Temp: 97.4 F (36.3 C)   98 F (36.7 C)  TempSrc: Oral   Oral  SpO2: 100%  96% 97%  Weight:  75.5 kg (166 lb 8 oz)    Height:        Intake/Output Summary (Last 24 hours) at 04/23/16 1659 Last data filed at 04/23/16 1511  Gross per  24 hour  Intake              410 ml  Output             1850 ml  Net            -1440 ml   Weight change: -3.221 kg (-7 lb 1.6 oz) Exam:   General:  Pt is alert, follows commands appropriately, not in acute distress  HEENT: No icterus, No thrush, No neck mass, [place]/AT  Cardiovascular: RRR, S1/S2, no  rubs, no gallops  Respiratory: Bibasilar crackles. No wheeze. Good air movement.  Abdomen: Soft/+BS, non tender, non distended, no guarding  Extremities: No edema, No lymphangitis, No petechiae, No rashes, no synovitis   Data Reviewed: I have personally reviewed following labs and imaging studies Basic Metabolic Panel:  Recent Labs Lab 04/17/16 0345 04/18/16 0241 04/19/16 0204 04/20/16 0200 04/21/16 0523 04/22/16 0617 04/23/16 0442  NA 141 141  --  137 137 139 139  K 3.9 4.0  --  4.4 4.2 3.5 3.7  CL 109 109  --  101 97* 93* 96*  CO2 23 25  --  29 32 37* 29  GLUCOSE 135* 109*  --  141* 104* 95 104*  BUN 11 11  --  11 21* 27* 27*  CREATININE 0.59 0.80  --  0.80 0.71 0.90 0.88  CALCIUM 6.7* 7.0*  --  7.8* 8.7* 8.7* 8.5*  MG 1.9 1.7 1.8 1.9  --   --  2.0   Liver Function Tests:  Recent Labs Lab 04/18/16 0241 04/21/16 0523  AST 31 29  ALT 22 24  ALKPHOS 54 50  BILITOT 0.5 0.6  PROT 5.8* 6.0*  ALBUMIN 2.5* 2.6*   No results for input(s): LIPASE, AMYLASE in the last 168 hours. No results for input(s): AMMONIA in the last 168 hours. Coagulation Profile: No results for input(s): INR, PROTIME in the last 168 hours. CBC:  Recent Labs Lab 04/21/16 0523 04/22/16 0617 04/23/16 0442  WBC 12.8* 15.4* 11.8*  HGB 10.8* 11.8* 11.2*  HCT 35.5* 38.7 35.9*  MCV 87.7 86.6 87.1  PLT 366 384 315   Cardiac Enzymes: No results for input(s): CKTOTAL, CKMB, CKMBINDEX, TROPONINI in the last 168 hours. BNP: Invalid input(s): POCBNP CBG: No results for input(s): GLUCAP in the last 168 hours. HbA1C: No results for input(s): HGBA1C in the last 72 hours. Urine analysis:    Component Value Date/Time   COLORURINE COLORLESS (A) 04/23/2016 1248   APPEARANCEUR CLEAR 04/23/2016 1248   LABSPEC 1.005 04/23/2016 1248   PHURINE 7.0 04/23/2016 1248   GLUCOSEU NEGATIVE 04/23/2016 1248   HGBUR SMALL (A) 04/23/2016 1248   BILIRUBINUR NEGATIVE 04/23/2016 1248   KETONESUR NEGATIVE  04/23/2016 1248   PROTEINUR NEGATIVE 04/23/2016 1248   NITRITE NEGATIVE 04/23/2016 1248   LEUKOCYTESUR LARGE (A) 04/23/2016 1248   Sepsis Labs: _0 (procalcitonin:4,lacticidven:4) ) Recent Results (from the past 240 hour(s))  Culture, blood (routine x 2)     Status: None   Collection Time: 04/15/16  2:06 AM  Result Value Ref Range Status   Specimen Description BLOOD RIGHT WRIST  Final   Special Requests IN PEDIATRIC BOTTLE 3ML  Final   Culture NO GROWTH 5 DAYS  Final   Report Status 04/20/2016 FINAL  Final  Culture, blood (routine x 2)     Status: None   Collection Time: 04/15/16  2:12 AM  Result Value Ref Range Status   Specimen Description BLOOD RIGHT HAND  Final  Special Requests BOTTLES DRAWN AEROBIC AND ANAEROBIC 5ML  Final   Culture NO GROWTH 5 DAYS  Final   Report Status 04/20/2016 FINAL  Final  Urine culture     Status: Abnormal   Collection Time: 04/15/16  9:47 AM  Result Value Ref Range Status   Specimen Description URINE, RANDOM  Final   Special Requests ADDED [id number]  Final   Culture (A)  Final    40,000 COLONIES/mL ESCHERICHIA COLI Confirmed Extended Spectrum Beta-Lactamase Producer (ESBL)    Report Status 04/18/2016 FINAL  Final   Organism ID, Bacteria ESCHERICHIA COLI (A)  Final      Susceptibility   Escherichia coli - MIC*    AMPICILLIN >=32 RESISTANT Resistant     CEFAZOLIN >=64 RESISTANT Resistant     CEFTRIAXONE 8 RESISTANT Resistant     CIPROFLOXACIN >=4 RESISTANT Resistant     GENTAMICIN >=16 RESISTANT Resistant     IMIPENEM <=0.25 SENSITIVE Sensitive     NITROFURANTOIN <=16 SENSITIVE Sensitive     TRIMETH/SULFA >=320 RESISTANT Resistant     AMPICILLIN/SULBACTAM >=32 RESISTANT Resistant     PIP/TAZO >=128 RESISTANT Resistant     Extended ESBL POSITIVE Resistant     * 40,000 COLONIES/mL ESCHERICHIA COLI  Urine culture     Status: None   Collection Time: 04/16/16  4:42 PM  Result Value Ref Range Status   Specimen Description URINE,  CLEAN CATCH  Final   Special Requests PATIENT ON FOLLOWING ZOSYN  Final   Culture NO GROWTH  Final   Report Status 04/17/2016 FINAL  Final     Scheduled Meds: . aspirin  300 mg Rectal Daily  . budesonide (PULMICORT) nebulizer solution  0.25 mg Nebulization BID  . famotidine (PEPCID) IV  20 mg Intravenous Q12H  . heparin  5,000 Units Subcutaneous Q8H  . ipratropium-albuterol  3 mL Nebulization TID  . mouth rinse  15 mL Mouth Rinse BID  . meropenem (MERREM) IV  1 g Intravenous Q8H   Continuous Infusions:  Procedures/Studies: Ct Head Wo Contrast  Result Date: 04/09/2016 CLINICAL DATA:  82 y/o F; code stroke post transarterial intervention. EXAM: CT HEAD WITHOUT CONTRAST TECHNIQUE: Contiguous axial images were obtained from the base of the skull through the vertex without intravenous contrast. COMPARISON:  04/09/2016 CT head. FINDINGS: Brain: No large territory acute infarct or intracranial hemorrhage identified. Patchy foci of hypoattenuation in periventricular and subcortical white matter may represent areas of infarction or chronic microvascular ischemic changes and are stable. Mild parenchymal volume loss. No focal mass effect. No extra-axial collection. No effacement of basilar cisterns. Vascular: Persistent opacification from previous contrast administration. Calcific atherosclerosis of carotid siphons. Skull: Normal. Negative for fracture or focal lesion. Sinuses/Orbits: Partial opacification of ethmoid and sphenoid sinuses with aerosolized secretions and partial opacification of mastoid air cells probably related to intubation. Orbits are unremarkable. Other: None. IMPRESSION: No large territory acute infarct or intracranial hemorrhage identified. Patchy foci of hypoattenuation in periventricular and subcortical white matter may represent areas of infarction or chronic microvascular ischemic changes and are stable. If clinically indicated MRI is more sensitive for acute ischemia.  Electronically Signed   By: Kristine Garbe M.D.   On: 04/09/2016 22:51   Mr Virgel Paling LO Contrast  Result Date: 04/10/2016 CLINICAL DATA:  82 year old female status post neuro intervention on 04/09/2016 for acute onset right side weakness and inability to speak. Status post Bilateral cerebral angiogram with Revascularization of occluded LT MCA ,Lt ACA and Lt ICA terminus with x1  PASS WITH A COMBINATION OF solitaire 70m x 40 mm retrieval device with simultaneous aspiration at the intermediate guide catheter and flow gate guide catheter, and 3.6 mg of superselective intracranial INTEGRELIN. Initial encounter. EXAM: MRI HEAD WITHOUT CONTRAST MRA HEAD WITHOUT CONTRAST TECHNIQUE: Multiplanar, multiecho pulse sequences of the brain and surrounding structures were obtained without intravenous contrast. Angiographic images of the head were obtained using MRA technique without contrast. COMPARISON:  Post tPA head CT 04/09/2016 FINDINGS: MRI HEAD FINDINGS Brain: Multiple small cortically based and white-matter foci of restricted diffusion in the left temporal lobe, best seen on axial diffusion weighted imaging (series 4, image 20). Evidence of petechial hemorrhage in the left mesial temporal lobe which appears to track to the left cauda thalamic groove where diffusion is also heterogeneous. These areas demonstrate mild T2 and FLAIR hyperintensity without mass effect. In addition there are several other widely scattered mostly cortically based foci of restricted diffusion in both MCA territories at over the superior convexities. Similar scattered small infarcts in the left occipital lobe. One or 2 such areas in the left cerebellum. No other intracranial hemorrhage. No intracranial mass effect or ventriculomegaly. Superimposed patchy bilateral cerebral white matter T2 and FLAIR hyperintensity. No chronic cortical encephalomalacia. Right deep gray matter nuclei and brainstem are normal for age. Negative pituitary  and cervicomedullary junction. Vascular: Major intracranial vascular flow voids are preserved. Skull and upper cervical spine: Negative. Normal bone marrow signal. Sinuses/Orbits: Normal orbits soft tissues. Trace paranasal sinus mucosal thickening. Mild mastoid effusions. Other: Negative scalp soft tissues. MRA HEAD FINDINGS Mildly motion degraded. Antegrade flow in the posterior circulation with mildly dominant distal right vertebral artery. Normal PICA origins. Patent vertebrobasilar junction. Patent basilar artery without stenosis. Patent SCA and normal right PCA origins. Fetal type left PCA origin. Right posterior communicating artery diminutive or absent. Absent flow signal in the inferior left PCA P3 division. Otherwise the bilateral PCA branches appear normal. Antegrade flow in both ICA siphons with some generalized ICA dolichoectasia and tortuosity of the distal cervical right ICA. Patent carotid termini. Normal MCA and ACA origins. Generalized ACA dolichoectasia. Proximal ACA branches appear normal. Both MCA bifurcations appear patent. MCA branch detail is degraded by motion. Fairly symmetric appearance of bilateral MCA branch flow signal. IMPRESSION: 1. Small volume of scattered acute infarcts in the left hemisphere primarily in the left temporal and occipital lobes (note fetal type left PCA origin anatomy). 2. Petechial hemorrhage in the mesial left temporal lobe tracking to the cauda thalamic groove. No associated mass effect and no malignant hemorrhagic transformation. 3. A small number of other scattered small infarcts in the bilateral MCA and left cerebellar artery territories might be related to endovascular intervention. 4. Motion degraded intracranial MRA negative for emergent large vessel occlusion or proximal intracranial stenosis. Possible occlusion of the left PCA P3 inferior division. Electronically Signed   By: HGenevie AnnM.D.   On: 04/10/2016 19:16   Mr Brain Wo Contrast  Result Date:  04/10/2016 CLINICAL DATA:  82year old female status post neuro intervention on 04/09/2016 for acute onset right side weakness and inability to speak. Status post Bilateral cerebral angiogram with Revascularization of occluded LT MCA ,Lt ACA and Lt ICA terminus with x1 PASS WITH A COMBINATION OF solitaire 422mx 40 mm retrieval device with simultaneous aspiration at the intermediate guide catheter and flow gate guide catheter, and 3.6 mg of superselective intracranial INTEGRELIN. Initial encounter. EXAM: MRI HEAD WITHOUT CONTRAST MRA HEAD WITHOUT CONTRAST TECHNIQUE: Multiplanar, multiecho pulse sequences of the  brain and surrounding structures were obtained without intravenous contrast. Angiographic images of the head were obtained using MRA technique without contrast. COMPARISON:  Post tPA head CT 04/09/2016 FINDINGS: MRI HEAD FINDINGS Brain: Multiple small cortically based and white-matter foci of restricted diffusion in the left temporal lobe, best seen on axial diffusion weighted imaging (series 4, image 20). Evidence of petechial hemorrhage in the left mesial temporal lobe which appears to track to the left cauda thalamic groove where diffusion is also heterogeneous. These areas demonstrate mild T2 and FLAIR hyperintensity without mass effect. In addition there are several other widely scattered mostly cortically based foci of restricted diffusion in both MCA territories at over the superior convexities. Similar scattered small infarcts in the left occipital lobe. One or 2 such areas in the left cerebellum. No other intracranial hemorrhage. No intracranial mass effect or ventriculomegaly. Superimposed patchy bilateral cerebral white matter T2 and FLAIR hyperintensity. No chronic cortical encephalomalacia. Right deep gray matter nuclei and brainstem are normal for age. Negative pituitary and cervicomedullary junction. Vascular: Major intracranial vascular flow voids are preserved. Skull and upper cervical spine:  Negative. Normal bone marrow signal. Sinuses/Orbits: Normal orbits soft tissues. Trace paranasal sinus mucosal thickening. Mild mastoid effusions. Other: Negative scalp soft tissues. MRA HEAD FINDINGS Mildly motion degraded. Antegrade flow in the posterior circulation with mildly dominant distal right vertebral artery. Normal PICA origins. Patent vertebrobasilar junction. Patent basilar artery without stenosis. Patent SCA and normal right PCA origins. Fetal type left PCA origin. Right posterior communicating artery diminutive or absent. Absent flow signal in the inferior left PCA P3 division. Otherwise the bilateral PCA branches appear normal. Antegrade flow in both ICA siphons with some generalized ICA dolichoectasia and tortuosity of the distal cervical right ICA. Patent carotid termini. Normal MCA and ACA origins. Generalized ACA dolichoectasia. Proximal ACA branches appear normal. Both MCA bifurcations appear patent. MCA branch detail is degraded by motion. Fairly symmetric appearance of bilateral MCA branch flow signal. IMPRESSION: 1. Small volume of scattered acute infarcts in the left hemisphere primarily in the left temporal and occipital lobes (note fetal type left PCA origin anatomy). 2. Petechial hemorrhage in the mesial left temporal lobe tracking to the cauda thalamic groove. No associated mass effect and no malignant hemorrhagic transformation. 3. A small number of other scattered small infarcts in the bilateral MCA and left cerebellar artery territories might be related to endovascular intervention. 4. Motion degraded intracranial MRA negative for emergent large vessel occlusion or proximal intracranial stenosis. Possible occlusion of the left PCA P3 inferior division. Electronically Signed   By: Genevie Ann M.D.   On: 04/10/2016 19:16   Dg Chest Port 1 View  Result Date: 04/23/2016 CLINICAL DATA:  COPD, aspiration pneumonia. EXAM: PORTABLE CHEST 1 VIEW COMPARISON:  Portable chest x-ray of Sep 20, 2015  FINDINGS: The lungs are mildly hyperinflated. The interstitial markings are coarse. Increased density at the right lung base is consistent with the clinically suspected aspiration pneumonia. The heart and pulmonary vascularity are normal. There is calcification in the wall of the tortuous thoracic aorta. There is no pleural effusion. The bony thorax exhibits no acute abnormality. IMPRESSION: Right basilar pneumonia consistent with aspiration. COPD. Thoracic aortic atherosclerosis. When the patient can tolerate the procedure, a PA and lateral chest x-ray would be useful. Electronically Signed   By: Prisila Dlouhy  [place] M.D.   On: 04/23/2016 12:34   Dg Chest Port 1 View  Result Date: 04/21/2016 CLINICAL DATA:  Initial evaluation for acute respiratory distress, wheezing. History  of COPD. EXAM: PORTABLE CHEST 1 VIEW COMPARISON:  Prior radiograph from 04/21/2016. FINDINGS: Cardiac and mediastinal silhouettes are stable in size and contour, and remain within normal limits. Atherosclerotic disease noted within the aortic arch. Lungs are hypoinflated. Changes related COPD noted. Increased right basilar opacity favored to reflect atelectasis/bronchovascular crowding, although recurrent infiltrate not excluded. Left basilar atelectasis noted. Small left pleural effusion. No pulmonary edema or pneumothorax. No acute osseous abnormality. IMPRESSION: 1. Shallow lung inflation with increased right basilar opacity, favored to reflect atelectasis/ bronchovascular crowding, although recurrent infiltrate could be considered in the correct clinical setting. 2. Small left pleural effusion with associated atelectasis. 3. COPD. Electronically Signed   By: Jeannine Boga M.D.   On: 04/21/2016 20:38   Dg Chest Port 1 View  Result Date: 04/21/2016 CLINICAL DATA:  Follow-up wheezing, COPD. EXAM: PORTABLE CHEST 1 VIEW COMPARISON:  04/20/2016 FINDINGS: There is hyperinflation of the lungs compatible with COPD. Heart and mediastinal  contours are within normal limits. No focal opacities or effusions. No acute bony abnormality. IMPRESSION: COPD.  No active disease. Electronically Signed   By: Rolm Baptise M.D.   On: 04/21/2016 08:27   Dg Chest Port 1 View  Result Date: 04/20/2016 CLINICAL DATA:  Pneumonia. EXAM: PORTABLE CHEST 1 VIEW COMPARISON:  04/16/2016. FINDINGS: Mediastinum and hilar structures are normal. Heart size stable. Low lung volumes with basilar atelectasis. Slight improvement of right base infiltrate. A component of right base bronchiectasis cannot be excluded. Tiny left pleural effusion. No pneumothorax. IMPRESSION: 1. Interim partial clearing of right base infiltrate. A component of bronchiectasis in the right base cannot be excluded. 2. Low lung volumes.  Small left pleural effusion noted. Electronically Signed   By: Marcello Moores  Register   On: 04/20/2016 07:20   Dg Chest Port 1 View  Result Date: 04/16/2016 CLINICAL DATA:  Acute onset of shortness of breath. Initial encounter. EXAM: PORTABLE CHEST 1 VIEW COMPARISON:  Chest radiograph performed 04/14/2016 FINDINGS: Persistent right basilar airspace opacity remains concerning for pneumonia. No definite pleural effusion or pneumothorax is seen. Mild vascular congestion is noted. The cardiomediastinal silhouette is mildly enlarged. No acute osseous abnormalities are identified. IMPRESSION: 1. Persistent right basilar airspace opacity remains concerning for pneumonia. 2. Mild vascular congestion and mild cardiomegaly. Electronically Signed   By: Garald Balding M.D.   On: 04/16/2016 06:26   Dg Chest Port 1 View  Result Date: 04/14/2016 CLINICAL DATA:  Acute onset of shortness of breath and cough. Initial encounter. EXAM: PORTABLE CHEST 1 VIEW COMPARISON:  Chest radiograph performed 04/11/2016 FINDINGS: Right basilar airspace opacity raises concern for pneumonia. No pleural effusion or pneumothorax is seen. The cardiomediastinal silhouette is borderline normal in size. No  acute osseous abnormalities are identified. IMPRESSION: Right basilar pneumonia noted. Electronically Signed   By: Garald Balding M.D.   On: 04/14/2016 21:50   Dg Chest Port 1 View  Result Date: 04/11/2016 CLINICAL DATA:  82 year old female with respiratory failure EXAM: PORTABLE CHEST 1 VIEW COMPARISON:  Prior chest x-ray 04/09/2016 FINDINGS: The patient has been extubated and the nasogastric tube removed. Inspiratory volumes are lower. Nonspecific patchy opacities in the right greater than left base. Stable cardiomegaly. Mild pulmonary vascular congestion without edema. Atherosclerotic, ectatic and tortuous thoracic aorta. Right IJ approach central venous catheter remains in unchanged position. The tip of the catheter is at the superior cavoatrial junction. Osseous structures are intact and unremarkable. IMPRESSION: 1. Interval extubation and removal of nasogastric tube. Right IJ central venous catheter remains in stable and  satisfactory position. 2. Lower inspiratory volumes with increased bibasilar atelectasis and pulmonary vascular congestion. 3.  Aortic Atherosclerosis (ICD10-170.0) Electronically Signed   By: Jacqulynn Cadet M.D.   On: 04/11/2016 08:12   Dg Chest Port 1 View  Result Date: 04/10/2016 CLINICAL DATA:  Endotracheal tube and orogastric tube placement. Initial encounter. EXAM: PORTABLE CHEST 1 VIEW COMPARISON:  None. FINDINGS: The patient's endotracheal tube is seen ending 2 cm above the carina. A right IJ line is noted ending about the distal SVC. An enteric tube is noted extending below the diaphragm. Mild bibasilar atelectasis is noted. No pleural effusion or pneumothorax is seen. The cardiomediastinal silhouette is borderline normal in size. No acute osseous abnormalities are identified. IMPRESSION: 1. Endotracheal tube seen ending 2 cm above the carina. 2. Right IJ line noted ending about the distal SVC. 3. Enteric tube noted extending below the diaphragm. 4. Mild bibasilar  atelectasis. Electronically Signed   By: Garald Balding M.D.   On: 04/10/2016 00:02   Dg Abd Portable 1v  Result Date: 04/09/2016 CLINICAL DATA:  82 y/o  F; OG tube placement. EXAM: PORTABLE ABDOMEN - 1 VIEW COMPARISON:  None. FINDINGS: Patient is rotated. Normal bowel gas pattern. Enteric tube tip is below the diaphragm probably in the distal stomach. Slight densities projecting over the kidneys bilaterally may represent stones. Degenerative changes of the spine. IMPRESSION: Enteric tube tip probably in distal stomach. Densities projecting over kidneys may represent nephrolithiasis. Electronically Signed   By: Kristine Garbe M.D.   On: 04/09/2016 23:56   Dg Swallowing Func-speech Pathology  Result Date: 04/17/2016 Objective Swallowing Evaluation: Type of Study: MBS-Modified Barium Swallow Study Patient Details Name: Chandria Rookstool MRN: [id number] Date of Birth: 1927/12/02 Today's Date: 04/17/2016 Time: SLP Start Time (ACUTE ONLY): 1045-SLP Stop Time (ACUTE ONLY): 1115 SLP Time Calculation (min) (ACUTE ONLY): 30 min Past Medical History: Past Medical History: Diagnosis Date . COPD (chronic obstructive pulmonary disease) ([place])  Past Surgical History: Past Surgical History: Procedure Laterality Date . IR GENERIC HISTORICAL  04/09/2016  IR ANGIO INTRA EXTRACRAN SEL COM CAROTID INNOMINATE UNI R MOD SED 04/09/2016 Luanne Bras, MD [facility] . IR GENERIC HISTORICAL  04/09/2016  IR PERCUTANEOUS ART THROMBECTOMY/INFUSION INTRACRANIAL INC DIAG ANGIO 04/09/2016 Luanne Bras, MD [facility] . PELVIC FRACTURE SURGERY   . RADIOLOGY WITH ANESTHESIA N/A 04/09/2016  Procedure: RADIOLOGY WITH ANESTHESIA;  Surgeon: Luanne Bras, MD;  Location: [place];  Service: Radiology;  Laterality: N/A; . TEE WITHOUT CARDIOVERSION N/A 04/13/2016  Procedure: TRANSESOPHAGEAL ECHOCARDIOGRAM (TEE);  Surgeon: Sanda Klein, MD;  Location: [facility] ENDOSCOPY;  Service: Cardiovascular;  Laterality: N/A; HPI: 82 y.o. right  handed female with h/o COPD. Presented 04/10/2016 with sudden onset of right-sided weakness and inability to speak. 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 Pt discharged to [facility] 11/28, however, later that night she became dyspneic and diaphoretic. She was given albuterol nebs with some improvement. CXR with RLL infiltrate. Subjective: pt awake, pleasant Assessment / Plan / Recommendation CHL IP CLINICAL IMPRESSIONS 04/17/2016 Therapy Diagnosis Mild oral phase dysphagia;Mild pharyngeal phase dysphagia Clinical Impression Pt's swallow function demonstrates mild improvements since [facility] 11/26.  She presents with piecemeal oral bolusing; persisting delay in initiation (valleculae and pyriforms) -however, this is not necessarily considered disordered in the elderly population.  There may have been questionable, high penetration of liquids, but it was difficult to discern.  No aspiration.  Pyriform sinuses appear to be asymmetric and there is mild residue remaining post-swallow. Recommend advancing diet to dysphagia 3, nectar-thick liquids for now.  Will follow for functional toleration/safety.   Impact on safety and function Mild aspiration risk   CHL IP TREATMENT RECOMMENDATION 04/17/2016 Treatment Recommendations Therapy as outlined in treatment plan below   Prognosis 04/17/2016 Prognosis for Safe Diet Advancement Good Barriers to Reach Goals -- Barriers/Prognosis Comment -- CHL IP DIET RECOMMENDATION 04/17/2016 SLP Diet Recommendations Dysphagia 3 (Mech soft) solids;Nectar thick liquid Liquid Administration via Cup Medication Administration Whole meds with puree Compensations Small sips/bites;Slow rate Postural Changes --   CHL IP OTHER RECOMMENDATIONS 04/17/2016 Recommended Consults -- Oral Care Recommendations Oral care BID Other Recommendations --   CHL IP FOLLOW UP RECOMMENDATIONS  04/17/2016 Follow up Recommendations Inpatient Rehab   CHL IP FREQUENCY AND DURATION 04/17/2016 Speech Therapy Frequency (ACUTE ONLY) min 2x/week Treatment Duration 2 weeks      CHL IP ORAL PHASE 04/17/2016 Oral Phase Impaired Oral - Pudding Teaspoon -- Oral - Pudding Cup -- Oral - Honey Teaspoon -- Oral - Honey Cup -- Oral - Nectar Teaspoon -- Oral - Nectar Cup -- Oral - Nectar Straw -- Oral - Thin Teaspoon -- Oral - Thin Cup -- Oral - Thin Straw -- Oral - Puree Piecemeal swallowing Oral - Mech Soft Piecemeal swallowing Oral - Regular -- Oral - Multi-Consistency -- Oral - Pill -- Oral Phase - Comment --  CHL IP PHARYNGEAL PHASE 04/17/2016 Pharyngeal Phase Impaired Pharyngeal- Pudding Teaspoon -- Pharyngeal -- Pharyngeal- Pudding Cup -- Pharyngeal -- Pharyngeal- Honey Teaspoon -- Pharyngeal -- Pharyngeal- Honey Cup NT Pharyngeal -- Pharyngeal- Nectar Teaspoon -- Pharyngeal -- Pharyngeal- Nectar Cup Pharyngeal residue - pyriform;Delayed swallow initiation-pyriform sinuses Pharyngeal Material enters airway, remains ABOVE vocal cords then ejected out Pharyngeal- Nectar Straw -- Pharyngeal -- Pharyngeal- Thin Teaspoon -- Pharyngeal -- Pharyngeal- Thin Cup Delayed swallow initiation-pyriform sinuses;Pharyngeal residue - pyriform Pharyngeal Material enters airway, remains ABOVE vocal cords then ejected out Pharyngeal- Thin Straw -- Pharyngeal -- Pharyngeal- Puree Delayed swallow initiation-vallecula;Pharyngeal residue - pyriform Pharyngeal -- Pharyngeal- Mechanical Soft Delayed swallow initiation-vallecula;Pharyngeal residue - pyriform Pharyngeal -- Pharyngeal- Regular -- Pharyngeal -- Pharyngeal- Multi-consistency -- Pharyngeal -- Pharyngeal- Pill -- Pharyngeal -- Pharyngeal Comment --  CHL IP CERVICAL ESOPHAGEAL PHASE 04/12/2016 Cervical Esophageal Phase WFL Pudding Teaspoon -- Pudding Cup -- Honey Teaspoon -- Honey Cup -- Nectar Teaspoon -- Nectar Cup -- Nectar Straw -- Thin Teaspoon -- Thin Cup -- Thin Straw -- Puree --  Mechanical Soft -- Regular -- Multi-consistency -- Pill -- Cervical Esophageal Comment -- No flowsheet data found. Juan Quam Laurice 04/17/2016, 11:50 AM              Dg Swallowing Func-speech Pathology  Result Date: 04/12/2016 Objective Swallowing Evaluation: Type of Study: MBS-Modified Barium Swallow Study Patient Details Name: Jessice Madill MRN: [id number] Date of Birth: 01/29/1928 Today's Date: 04/12/2016 Time: SLP Start Time (ACUTE ONLY): 1116-SLP Stop Time (ACUTE ONLY): 1131 SLP Time Calculation (min) (ACUTE ONLY): 15 min Past Medical History: Past Medical History: Diagnosis Date . COPD (chronic obstructive pulmonary disease) ([place])  Past Surgical History: Past Surgical History: Procedure Laterality Date . PELVIC FRACTURE SURGERY   . RADIOLOGY WITH ANESTHESIA N/A 04/09/2016  Procedure: RADIOLOGY WITH ANESTHESIA;  Surgeon: Luanne Bras, MD;  Location: [place];  Service: Radiology;  Laterality: N/A; HPI: Ms Ollinger is an 82 yo female with h/o COPD admitted with mental status change, right sided weakness, hemianopsia, left gaze preference,  Pt  required intubation and was extubated yesterday.  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  Swallow and speech eval ordered Subjective: pt awake, pleasant Assessment / Plan / Recommendation CHL IP CLINICAL IMPRESSIONS 04/12/2016 Therapy Diagnosis Moderate pharyngeal phase dysphagia Clinical Impression Pt has a moderate pharyngeal dysphagia due in large part to delayed swallow initiation and decreased sensation of airway penetration. Liquids spill to the pyriform sinuses, resulting in deep, silent penetration of thin and nectar thick liquids. Although this does not occur with every bolus, it is silent and unable to be completely cleared with cued coughing, making her at a higher risk for an aspiration-related infection. Her strength is adequate with no significant residuals  remaining post-swallow. Recommend Dys 3 diet and honey thick liquids. SLP to f/u for tolerance and readiness to advance. Impact on safety and function Moderate aspiration risk   CHL IP TREATMENT RECOMMENDATION 04/12/2016 Treatment Recommendations Therapy as outlined in treatment plan below   Prognosis 04/12/2016 Prognosis for Safe Diet Advancement Good Barriers to Reach Goals -- Barriers/Prognosis Comment -- CHL IP DIET RECOMMENDATION 04/12/2016 SLP Diet Recommendations Dysphagia 3 (Mech soft) solids;Honey thick liquids Liquid Administration via Cup;No straw Medication Administration Whole meds with puree Compensations Slow rate;Small sips/bites Postural Changes Remain semi-upright after after feeds/meals (Comment);Seated upright at 90 degrees   CHL IP OTHER RECOMMENDATIONS 04/12/2016 Recommended Consults -- Oral Care Recommendations Oral care BID Other Recommendations Order thickener from pharmacy;Prohibited food (jello, ice cream, thin soups);Remove water pitcher   CHL IP FOLLOW UP RECOMMENDATIONS 04/12/2016 Follow up Recommendations Inpatient Rehab   CHL IP FREQUENCY AND DURATION 04/12/2016 Speech Therapy Frequency (ACUTE ONLY) min 2x/week Treatment Duration 2 weeks      CHL IP ORAL PHASE 04/12/2016 Oral Phase WFL Oral - Pudding Teaspoon -- Oral - Pudding Cup -- Oral - Honey Teaspoon -- Oral - Honey Cup -- Oral - Nectar Teaspoon -- Oral - Nectar Cup -- Oral - Nectar Straw -- Oral - Thin Teaspoon -- Oral - Thin Cup -- Oral - Thin Straw -- Oral - Puree -- Oral - Mech Soft -- Oral - Regular -- Oral - Multi-Consistency -- Oral - Pill -- Oral Phase - Comment --  CHL IP PHARYNGEAL PHASE 04/12/2016 Pharyngeal Phase Impaired Pharyngeal- Pudding Teaspoon -- Pharyngeal -- Pharyngeal- Pudding Cup -- Pharyngeal -- Pharyngeal- Honey Teaspoon -- Pharyngeal -- Pharyngeal- Honey Cup Delayed swallow initiation-pyriform sinuses Pharyngeal -- Pharyngeal- Nectar Teaspoon -- Pharyngeal -- Pharyngeal- Nectar Cup Delayed swallow  initiation-pyriform sinuses;Penetration/Aspiration before swallow Pharyngeal Material enters airway, remains ABOVE vocal cords and not ejected out Pharyngeal- Nectar Straw -- Pharyngeal -- Pharyngeal- Thin Teaspoon -- Pharyngeal -- Pharyngeal- Thin Cup Delayed swallow initiation-pyriform sinuses;Penetration/Aspiration before swallow Pharyngeal Material enters airway, CONTACTS cords and not ejected out Pharyngeal- Thin Straw -- Pharyngeal -- Pharyngeal- Puree Delayed swallow initiation-vallecula Pharyngeal -- Pharyngeal- Mechanical Soft Delayed swallow initiation-vallecula Pharyngeal -- Pharyngeal- Regular -- Pharyngeal -- Pharyngeal- Multi-consistency -- Pharyngeal -- Pharyngeal- Pill -- Pharyngeal -- Pharyngeal Comment --  CHL IP CERVICAL ESOPHAGEAL PHASE 04/12/2016 Cervical Esophageal Phase WFL Pudding Teaspoon -- Pudding Cup -- Honey Teaspoon -- Honey Cup -- Nectar Teaspoon -- Nectar Cup -- Nectar Straw -- Thin Teaspoon -- Thin Cup -- Thin Straw -- Puree -- Mechanical Soft -- Regular -- Multi-consistency -- Pill -- Cervical Esophageal Comment -- No flowsheet data found. Germain Osgood 04/12/2016, 1:05 PM  Germain Osgood, M.A. CCC-SLP [phone]  Ir Percutaneous Art Thrombectomy/infusion Intracranial Inc Diag Angio  Result Date: 04/13/2016 CLINICAL DATA:  Right-sided hemiplegia, left gaze preference, confusion, global aphasia. EXAM: IR PERCUTANEOUS ART THORMBECTOMY/INFUSION INTRACRANIAL INCLUDE DIAG ANGIO; IR ANGIO INTRA EXTRACRAN SEL COM CAROTID INNOMINATE UNI RIGHT MOD SED PROCEDURE: Following a full explanation of the procedure along with the potential associated complications, an informed witnessed consent was obtained. Risks of intracranial hemorrhage of 10-15%, worsening neurological deficit, death, and the inability to revascularize were all reviewed in detail with the patient's daughter. Informed consent was obtained. The patient was then put under general anesthesia by the  Department of Anesthesiology at [facility]. The right groin was prepped and draped in the usual sterile fashion. Thereafter using modified Seldinger technique, transfemoral access into the right common femoral artery was obtained without difficulty. Over a 0.035 inch guidewire a 5 French Pinnacle sheath was inserted. Through this, and also over a 0.035 inch guidewire a 5 [place] JB 1 catheter was advanced to the aortic arch region and selectively positioned in the left common carotid artery and later in the right common carotid artery. There were no acute complications. The patient tolerated the procedure well. Contrast: Isovue 300 approximately 70 mL. Anesthesia/Sedation:  General anesthesia. Medications: As per general anesthesia. FINDINGS: The right common carotid arteriogram demonstrates moderate to severe tortuosity of the left common carotid artery in its proximal portion with acute angular takeoff towards the aortic arch. Slow flow was seen ascending to the left internal carotid artery. There was no angiographic visualization of the left external carotid artery consistent with occlusion. The left internal carotid artery at the bulb demonstrated no occlusions with wide patency. 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 The right common carotid arteriogram demonstrates the right external carotid artery and its major branches to be widely patent. The right internal carotid at the bulb and just distally appears widely patent. Focal areas of focal outpouching are seen to emanate from the posterior wall of the right internal carotid artery in the proximal one-third. Distal to this there is a double U shaped tortuosity of the mid left internal carotid artery. Distal to this the left internal carotid artery is seen to opacify normally to the cranial skull base. There is mild  fusiform prominence of the petrous cavernous junction extending into the proximal cavernous artery. The distal cavernous carotid and supraclinoid segments are widely patent. There is a mild focal bulge noted at the level of the right posterior communicating artery. The right middle cerebral artery and the right anterior cerebral artery opacify normally into the capillary and venous phases. ENDOVASCULAR COMPLETE REVASCULARIZATION OF OCCLUDED LEFT INTERNAL CAROTID ARTERY TERMINUS, THE LEFT MIDDLE CEREBRAL ARTERY AND LEFT ANTERIOR CEREBRAL ARTERY. The diagnostic JB 1 catheter in the left common carotid artery was then exchanged over a 0.035 inch 300 cm Rosen exchange guidewire for a 8 French 55 cm Brite tip neurovascular sheath using biplane roadmap technique and constant fluoroscopic guidance. Good aspiration obtained from the side port of the neurovascular sheath. This was then connected to continuous heparinized saline infusion. Over the [facility] exchange guidewire the distal end of which was at the origin of the left external carotid artery an 8 French 85 cm FlowGate balloon guide catheter which had been prepped with 50% contrast and 50% heparinized saline infusion was then advanced to just the origin of left internal carotid artery. The guidewire  was removed. Good aspiration was obtained from the hub of the 8 [place] FlowGate guide catheter. Gentle contrast injection demonstrated no evidence of dissections or of intraluminal filling defects. Over a 0.014 inch Softip Synchro micro guidewire, a 021 Trevo ProVue micro catheter was then advanced to just distal to the origin of the 85 cm 8 [place] FlowGate guide catheter. The micro catheter and micro guidewire were advanced without difficulty to the cranial skull base. However, further advancement of the micro catheter was met with herniation of the entire platform into the aortic arch. The entire system was then removed and replaced with a 8 French 80 cm Arrow  neurovascular sheath which was positioned over an exchange micro guidewire just proximal to the origin of the left internal carotid artery. Over the [employer] guidewire, the 8 [place] 80 cm Arrow neurovascular sheath, the 8 French 85 cm FlowGate guide catheter was then advanced and positioned just proximal to the left internal carotid artery origin. The guidewire was removed. Good aspiration obtained from the hub of the 8 [place] FlowGate guide catheter. Gentle contrast injection demonstrated no evidence of spasms, dissections or of intraluminal filling defects. 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 Over a 0.014 inch Softip Synchro micro guidewire which had a J-tip configuration, the combination of an intermediary 6 French 132 cm Catalyst guide catheter inside of which was a Trevo ProVue micro catheter, the combination was advanced without difficulty to the supraclinoid left ICA. The micro guidewire was then gently advanced through the occluded left middle cerebral artery into the M2 M3 regions of the inferior division followed by the micro catheter and also followed by the 6 French 32 cm Catalyst guide catheter into the proximal left M1 segment. The micro guidewire was then removed. Aspiration was obtained from the hub of the micro catheter. Approximately 3.6 mg of arterial Integrilin were infused 1.8 mg through the micro catheter distally, and 1.8 mg through the 6 [place] Catalyst guide catheter. At this time, a 4 mm x 40 mm Solitaire FR retrieval device which had been prepped and purged with heparinized saline infusion was advanced to the distal end of the micro catheter in a coaxial manner and with constant heparinized saline infusion. The O rings on delivery micro catheter were then loosened. With slight forward gentle traction with the right hand on the delivery micro guidewire, with the left hand the  delivery micro catheter was retrieved until it had been deployed. At this time, the 6 [place] Catalyst 132 cm guide catheter was advanced to just inside the proximal portion of the retrieval device. The occlusion balloon at the origin of the left internal carotid artery was then expanded. Also a 20 mL syringe was hooked to the hub of the 6 [place] Catalyst guide catheter and a constant aspiration was then applied. Thereon after, the combination of the retrieval device, with the micro catheter and the 6 [place] guide catheter were then gently retrieved with constant fluoroscopic guidance and removed. Aspiration was continued with a 60 mL syringe at the hub of the 8 [place] FlowGate guide catheter which was then gently deflated. Free back bleed at the hub of the 8 [place] FlowGate guide catheter was achieved. The aspirate contained multiple focal chunks of grayish bloody thrombus. Similar thrombus was also noted within the 8 [place] Catalyst guide catheter. Clots were also noted in the interstices of the retrieval device. After having  obtained free aspiration of blood at the hub of the 8 [place] FlowGate guide catheter, control arteriogram performed through the [facility] guide catheter in the left internal carotid artery demonstrated brisk antegrade flow to the cranial skull base and into the left middle cerebral artery and left anterior cerebral artery distributions to the distal distributions. No angiographic filling defects or occlusions were seen. There was no evidence of spasm either. No mass-effect or midline shift was noted of the major vessels intracranially. The patient's hemodynamic and neurologic status remained stable. A TICI 3 reperfusion had been obtained. The 8 [place] FlowGate guide catheter and the 8 French 80 cm Arrow neurovascular sheath were then retrieved into the abdominal aorta and exchanged over a J-tiip guidewire for a 9 [place] Pinnacle sheath. This in turn was then connected to continuous heparinized  saline infusion. The patient's distal pulses remained unchanged Dopplerable early compared to prior to the procedure. The right groin appeared soft as did the left groin at the site of the applied sheath. The patient was transported to the CT scanner for postprocedural CT scan of brain. IMPRESSION: Status post endovascular complete revascularization of occluded left internal carotid artery terminus, the left middle cerebral artery and left anterior cerebral artery with 1 pass with the Solitaire 4 mm x 40 mm retrieval device in combination with aspiration of the hub of the intermediary catheter and proximal flow arrest, achieving a TICI 3 reperfusion as described above. Electronically Signed   By: Luanne Bras M.D.   On: 04/10/2016 13:15   Ct Head Code Stroke Wo Contrast`  Result Date: 04/09/2016 CLINICAL DATA:  Code stroke for right-sided weakness and loss of speech. EXAM: CT HEAD WITHOUT CONTRAST TECHNIQUE: Contiguous axial images were obtained from the base of the skull through the vertex without intravenous contrast. COMPARISON:  None. FINDINGS: Brain: No evidence of acute infarction, hemorrhage, hydrocephalus, extra-axial collection or mass lesion/mass effect. Vascular: Atherosclerotic calcification.  No hyperdense vessel. Skull: Negative Sinuses/Orbits: Mild chronic sinusitis and left mastoiditis. Other: Results were called by telephone at the time of interpretation on 04/09/2016 at 6:41 pm to Dr. Lawana Pai, who verbally acknowledged these results. ASPECTS [facility] Stroke Program Early CT Score) - Ganglionic level infarction (caudate, lentiform nuclei, internal capsule, insula, M1-M3 cortex): 7 - Supraganglionic infarction (M4-M6 cortex): 3 Total score (0-10 with 10 being normal): 10 IMPRESSION: No acute finding. ASPECTS is 10. Electronically Signed   By: Monte Fantasia M.D.   On: 04/09/2016 18:43   Ir Angio Intra Extracran Sel Com Carotid Innominate Uni R Mod Sed  Result Date: 04/13/2016 CLINICAL  DATA:  Right-sided hemiplegia, left gaze preference, confusion, global aphasia. EXAM: IR PERCUTANEOUS ART THORMBECTOMY/INFUSION INTRACRANIAL INCLUDE DIAG ANGIO; IR ANGIO INTRA EXTRACRAN SEL COM CAROTID INNOMINATE UNI RIGHT MOD SED PROCEDURE: Following a full explanation of the procedure along with the potential associated complications, an informed witnessed consent was obtained. Risks of intracranial hemorrhage of 10-15%, worsening neurological deficit, death, and the inability to revascularize were all reviewed in detail with the patient's daughter. Informed consent was obtained. The patient was then put under general anesthesia by the Department of Anesthesiology at [facility]. The right groin was prepped and draped in the usual sterile fashion. Thereafter using modified Seldinger technique, transfemoral access into the right common femoral artery was obtained without difficulty. Over a 0.035 inch guidewire a 5 French Pinnacle sheath was inserted. Through this, and also over a 0.035 inch guidewire a 5 [place] JB 1 catheter was advanced to the aortic arch region  and selectively positioned in the left common carotid artery and later in the right common carotid artery. There were no acute complications. The patient tolerated the procedure well. Contrast: Isovue 300 approximately 70 mL. Anesthesia/Sedation:  General anesthesia. Medications: As per general anesthesia. FINDINGS: The right common carotid arteriogram demonstrates moderate to severe tortuosity of the left common carotid artery in its proximal portion with acute angular takeoff towards the aortic arch. Slow flow was seen ascending to the left internal carotid artery. There was no angiographic visualization of the left external carotid artery consistent with occlusion. The left internal carotid artery at the bulb demonstrated no occlusions with wide patency. This extended to the cranial skull base with extreme slow ascent of contrast to a complete  occlusion of the left internal carotid artery at the petrous cavernous junction. The right common carotid arteriogram demonstrates the right external carotid artery and its major branches to be widely patent. The right internal carotid at the bulb and just distally appears widely patent. Focal areas of focal outpouching are seen to emanate from the posterior wall of the right internal carotid artery in the proximal one-third. Distal to this there is a double U shaped tortuosity of the mid left internal carotid artery. Distal to this the left internal carotid artery is seen to opacify normally to the cranial skull base. There is mild fusiform prominence of the petrous cavernous junction extending into the proximal cavernous artery. The distal cavernous carotid and supraclinoid segments are widely patent. There is a mild focal bulge noted at the level of the right posterior communicating artery. The right middle cerebral artery and the right anterior cerebral artery opacify normally into the capillary and venous phases. ENDOVASCULAR COMPLETE REVASCULARIZATION OF OCCLUDED LEFT INTERNAL CAROTID ARTERY TERMINUS, THE LEFT MIDDLE CEREBRAL ARTERY AND LEFT ANTERIOR CEREBRAL ARTERY. The diagnostic JB 1 catheter in the left common carotid artery was then exchanged over a 0.035 inch 300 cm Rosen exchange guidewire for a 8 French 55 cm Brite tip neurovascular sheath using biplane roadmap technique and constant fluoroscopic guidance. Good aspiration obtained from the side port of the neurovascular sheath. This was then connected to continuous heparinized saline infusion. Over the [facility] exchange guidewire the distal end of which was at the origin of the left external carotid artery an 8 French 85 cm FlowGate balloon guide catheter which had been prepped with 50% contrast and 50% heparinized saline infusion was then advanced to just the origin of left internal carotid artery. The guidewire was removed. Good aspiration was obtained  from the hub of the 8 [place] FlowGate guide catheter. Gentle contrast injection demonstrated no evidence of dissections or of intraluminal filling defects. Over a 0.014 inch Softip Synchro micro guidewire, a 021 Trevo ProVue micro catheter was then advanced to just distal to the origin of the 85 cm 8 [place] FlowGate guide catheter. The micro catheter and micro guidewire were advanced without difficulty to the cranial skull base. However, further advancement of the micro catheter was met with herniation of the entire platform into the aortic arch. The entire system was then removed and replaced with a 8 French 80 cm Arrow neurovascular sheath which was positioned over an exchange micro guidewire just proximal to the origin of the left internal carotid artery. Over the [employer] guidewire, the 8 [place] 80 cm Arrow neurovascular sheath, the 8 French 85 cm FlowGate guide catheter was then advanced and positioned just proximal to the left internal carotid artery origin. The guidewire was removed. Good aspiration  obtained from the hub of the 8 [place] FlowGate guide catheter. Gentle contrast injection demonstrated no evidence of spasms, dissections or of intraluminal filling defects. 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 Over a 0.014 inch Softip Synchro micro guidewire which had a J-tip configuration, the combination of an intermediary 6 French 132 cm Catalyst guide catheter inside of which was a Trevo ProVue micro catheter, the combination was advanced without difficulty to the supraclinoid left ICA. The micro guidewire was then gently advanced through the occluded left middle cerebral artery into the M2 M3 regions of the inferior division followed by the micro catheter and also followed by the 6 French 32 cm Catalyst guide catheter into the proximal left M1 segment. The micro guidewire was then removed. Aspiration  was obtained from the hub of the micro catheter. Approximately 3.6 mg of arterial Integrilin were infused 1.8 mg through the micro catheter distally, and 1.8 mg through the 6 [place] Catalyst guide catheter. At this time, a 4 mm x 40 mm Solitaire FR retrieval device which had been prepped and purged with heparinized saline infusion was advanced to the distal end of the micro catheter in a coaxial manner and with constant heparinized saline infusion. The O rings on delivery micro catheter were then loosened. With slight forward gentle traction with the right hand on the delivery micro guidewire, with the left hand the delivery micro catheter was retrieved until it had been deployed. At this time, the 6 [place] Catalyst 132 cm guide catheter was advanced to just inside the proximal portion of the retrieval device. The occlusion balloon at the origin of the left internal carotid artery was then expanded. Also a 20 mL syringe was hooked to the hub of the 6 [place] Catalyst guide catheter and a constant aspiration was then applied. Thereon after, the combination of the retrieval device, with the micro catheter and the 6 [place] guide catheter were then gently retrieved with constant fluoroscopic guidance and removed. Aspiration was continued with a 60 mL syringe at the hub of the 8 [place] FlowGate guide catheter which was then gently deflated. Free back bleed at the hub of the 8 [place] FlowGate guide catheter was achieved. The aspirate contained multiple focal chunks of grayish bloody thrombus. Similar thrombus was also noted within the 8 [place] Catalyst guide catheter. Clots were also noted in the interstices of the retrieval device. After having obtained free aspiration of blood at the hub of the 8 [place] FlowGate guide catheter, control arteriogram performed through the [facility] guide catheter in the left internal carotid artery demonstrated brisk antegrade flow to the cranial skull base and into the left middle cerebral  artery and left anterior cerebral artery distributions to the distal distributions. No angiographic filling defects or occlusions were seen. There was no evidence of spasm either. No mass-effect or midline shift was noted of the major vessels intracranially. The patient's hemodynamic and neurologic status remained stable. A TICI 3 reperfusion had been obtained. The 8 [place] FlowGate guide catheter and the 8 French 80 cm Arrow neurovascular sheath were then retrieved into the abdominal aorta and exchanged over a J-tiip guidewire for a 9 [place] Pinnacle sheath. This in turn was then connected to continuous heparinized saline infusion. The patient's distal pulses remained unchanged Dopplerable early compared to prior to the procedure. The right groin appeared soft as did the left groin at the site of the applied  sheath. The patient was transported to the CT scanner for postprocedural CT scan of brain. IMPRESSION: Status post endovascular complete revascularization of occluded left internal carotid artery terminus, the left middle cerebral artery and left anterior cerebral artery with 1 pass with the Solitaire 4 mm x 40 mm retrieval device in combination with aspiration of the hub of the intermediary catheter and proximal flow arrest, achieving a TICI 3 reperfusion as described above. Electronically Signed   By: Luanne Bras M.D.   On: 04/10/2016 13:15    Lisaanne Lawrie, DO  [facility] Pager [phone]  If 7PM-7AM, please contact night-coverage [email or web address] Password [email or web address] 04/23/2016, 4:59 PM   LOS: 8 days

## 2016-04-23 NOTE — Clinical Social Work Note (Signed)
Clinical Social Work Assessment  Patient Details  Name: Catherine Day MRN: [id number] Date of Birth: [birth year]  Date of referral:  04/23/16               Reason for consult:  Facility Placement                Permission sought to share information with:  Facility Sport and exercise psychologist, Family Supports Permission granted to share information::  No  Name::     Financial planner::  SNFs  Relationship::  Daughter  Contact Information:  [phone]  Housing/Transportation Living arrangements for the past 2 months:  [place] of Information:  Adult Children Patient Interpreter Needed:  None Criminal Activity/Legal Involvement Pertinent to Current Situation/Hospitalization:  No - Comment as needed Significant Relationships:  Adult Children Lives with:  Self Do you feel safe going back to the place where you live?  No Need for family participation in patient care:  Yes (Comment)  Care giving concerns:  CSW received consult for possible SNF placement at time of discharge if [facility] is unable to accept patient back. CSW met with patient and patient's daughter at bedside regarding possible SNF placement at time of discharge. Patient had been given Ativan and is disoriented. Per patient's daughter, she would like patient to return to [facility] but is open to snf if necessary. Patient's daughter expressed understanding of PT recommendation and may be agreeable to SNF placement at time of discharge. CSW to continue to follow and assist with discharge planning needs.   Social Worker assessment / plan:  CSW spoke with patient's daughter concerning possibility of rehab at [facility] before returning home.  Employment status:  Retired Forensic scientist:  [employer] PT Recommendations:  [place], [place] / Referral to community resources:  [place]  Patient/Family's Response to care:  Patient's daughter recognizes need for rehab before  returning home and may be agreeable to a SNF in [facility] if [facility] is not able to admit. She reports hoping for [facility] since it is very good care.   Patient/Family's Understanding of and Emotional Response to Diagnosis, Current Treatment, and Prognosis:  Patient/family is realistic regarding therapy needs and expressed being hopeful for CIR or SNF placement. Patient's daughter expressed understanding of CSW role and discharge process. No questions/concerns about plan or treatment.    Emotional Assessment Appearance:  Appears stated age Attitude/Demeanor/Rapport:  Unable to Assess Affect (typically observed):  Unable to Assess Orientation:   (Disoriented) Alcohol / Substance use:  Illicit Drugs Psych involvement (Current and /or in the community):  No (Comment)  Discharge Needs  Concerns to be addressed:  Care Coordination Readmission within the last 30 days:  Yes Current discharge risk:  None Barriers to Discharge:  Continued Medical Work up   [employer], [place] 04/23/2016, 3:54 PM

## 2016-04-23 NOTE — Progress Notes (Signed)
VASCULAR LAB PRELIMINARY  PRELIMINARY  PRELIMINARY  PRELIMINARY  Bilateral lower extremity venous duplex completed.    Preliminary report:  There is no obvious evidence of DVT or SVT noted in the visualized veins of the bilateral lower extremities.   Sawsan Riggio, RVT 04/23/2016, 2:26 PM

## 2016-04-23 NOTE — Progress Notes (Signed)
Dr. Craige CottaKirby paged to notify patient lungs with coarse crackles, increased congestion upon assessment. 98%on 2l/Loving, no distress noted at this time. Inquiring if would like another dose lasix given tonight. Awaiting response

## 2016-04-24 LAB — CBC
HCT: 37.4 % (ref 36.0–46.0)
Hemoglobin: 11.8 g/dL — ABNORMAL LOW (ref 12.0–15.0)
MCH: 27.5 pg (ref 26.0–34.0)
MCHC: 31.6 g/dL (ref 30.0–36.0)
MCV: 87.2 fL (ref 78.0–100.0)
PLATELETS: 265 10*3/uL (ref 150–400)
RBC: 4.29 MIL/uL (ref 3.87–5.11)
RDW: 15.7 % — ABNORMAL HIGH (ref 11.5–15.5)
WBC: 10.7 10*3/uL — ABNORMAL HIGH (ref 4.0–10.5)

## 2016-04-24 LAB — BASIC METABOLIC PANEL
Anion gap: 11 (ref 5–15)
BUN: 24 mg/dL — AB (ref 6–20)
CHLORIDE: 92 mmol/L — AB (ref 101–111)
CO2: 34 mmol/L — AB (ref 22–32)
CREATININE: 0.86 mg/dL (ref 0.44–1.00)
Calcium: 8.4 mg/dL — ABNORMAL LOW (ref 8.9–10.3)
GFR calc non Af Amer: 59 mL/min — ABNORMAL LOW (ref >60)
GLUCOSE: 109 mg/dL — AB (ref 65–99)
Potassium: 3.6 mmol/L (ref 3.5–5.1)
Sodium: 137 mmol/L (ref 135–145)

## 2016-04-24 LAB — URINE CULTURE: CULTURE: NO GROWTH

## 2016-04-24 MED ORDER — FUROSEMIDE 10 MG/ML IJ SOLN
40.0000 mg | Freq: Once | INTRAMUSCULAR | Status: AC
  Administered 2016-04-24: 40 mg via INTRAVENOUS
  Filled 2016-04-24: qty 4

## 2016-04-24 MED ORDER — FAMOTIDINE 20 MG PO TABS
20.0000 mg | ORAL_TABLET | Freq: Every day | ORAL | Status: DC
  Administered 2016-04-25 – 2016-04-27 (×3): 20 mg via ORAL
  Filled 2016-04-24 (×4): qty 1

## 2016-04-24 MED ORDER — BUDESONIDE 0.5 MG/2ML IN SUSP
0.5000 mg | Freq: Two times a day (BID) | RESPIRATORY_TRACT | Status: DC
  Administered 2016-04-24 – 2016-04-27 (×5): 0.5 mg via RESPIRATORY_TRACT
  Filled 2016-04-24 (×6): qty 2

## 2016-04-24 NOTE — Progress Notes (Signed)
CSW spoke with patient daugher, Bonita QuinLinda, and provided with current SNF bed offers.  Her first choice continues to be that the patient returns to [facility].  CSW will continue to follow  Burna SisJenna H. Rayshun Kandler, LCSW Clinical Social Worker [phone]

## 2016-04-24 NOTE — Progress Notes (Signed)
PROGRESS NOTE  Demarie Uhlig LGX:[id number] DOB: 11-Oct-1927 DOA: 04/15/2016 PCP: No primary care provider on file.  Brief Narrative: 82 yo right-handed female from [place] w/ a Hxof COPD (on 2 L O2) and HLD who presented on April 10, 2016 with thesudden onset of right-sided weakness and an inability to speak. CT and MRI showed small volume scattered acute infarctsin theleft hemisphere primarily in the left temporal and occipital lobes. No associated mass effect. Small number of other scattered small bilateral MCA left cerebellar artery territory infarcts. The patient did receive tPA. MRI showed possible occlusion of the left PCA-P3 inferior division. Underwent bilateral common carotid arteriograms followed by complete revascularization of occluded left MCA, left ACA, left ICA terminus. Neurology consulted, maintained on aspirin therapy for CVA prophylaxis. TEE noted no intracardiac source of embolism or shunt. She was transferred to CIR, but while there she suffered worsening hypoxia and on 11/29 required transfer back to the acute hospital for aspiration pneumonitis.   Assessment & Plan: Acute on chronic respiratory failure with hypoxia -multifactorial including aspiration pneumonia, CHF in the setting of underlying COPD -stable on 3 L -continue  pulmicort  -flutter valve -continue duonebs q 8 hours -normally on 2 L [place] at home  Acute toxic Encephalopathy -pt falls asleep when not engaged directly; lethargic -received Ativan 1 mg IV at 0430 on 04/23/16 for agitation -minimize benzo; haldol 1 mg IV q 6 hrs prn agitation -switch essential meds to parenteral -04/23/16-personally reviwed CXR--R- basilar opacity, small left effusion--pt already on merrem -04/24/16--much more alert, pleasantly confused  Aspiration pneumonia / pneumonitis - completed7days antibiotics - cleared for D3 diet w/ nectar liquids by SLP   PositiveESBL Escherichia coliUTI - Continue meropenem  D#7 of 7  Recent CVA - s/p IV TPA and IR treatments with R hemiparesthesia and dysphagia - hopefully can go back to CIR shortly - PT to evaluate, will likely need reconsult for inpatient rehabilitation if she still will meet criteria  Acute on Chronic Systolic and diastolicCHF - pt had some evidence of fluid overload -Continue IV lasix -NEG 5.9 L -04/13/16 TEE--EF 45-50%, grade 1 DD, normal RV -appears previous dry weight ~170 -held ARB due to soft BP--now improving -check UA-->6-30 WBC, but already on merrem  Hypokalemia - Replaced  Lower extremity pain and edema -venous duplex--NEG  Goals of Care -long discussion with daughter at bedside-->change to DNR -plan for SNF vs CIR   DVT prophylaxis:heparin [place] Code Status:Full code Family Communication:daughter updatedat bedside 12/8--Total time spent 35 minutes. Greater than 50% spent face to face counseling and coordinating care.  Disposition Plan:CIR vs SNF  Consultants:  PCCM  Procedures:  None   Antimicrobials: Zosyn 11/29 >12/2 Vancomycin 11/29 >12/2 Meropenem 12/2 >   Subjective: Patient is much more alert. She is pleasantly confused but alert and oriented 2. Denies any chest pain, shortness breath, nausea, vomiting, diarrhea, abdominal pain. No dysuria or hematuria. No rashes.  Objective: Vitals:   04/24/16 0500 04/24/16 0837 04/24/16 0839 04/24/16 1444  BP:    (!) 144/48  Pulse:    92  Resp:    18  Temp:    98.1 F (36.7 C)  TempSrc:    Oral  SpO2:  98% 98% 97%  Weight: 73.8 kg (162 lb 11.2 oz)     Height:        Intake/Output Summary (Last 24 hours) at 04/24/16 1656 Last data filed at 04/24/16 1608  Gross per 24 hour  Intake              350 ml  Output             1225 ml  Net             -875 ml   Weight change: -1.724 kg (-3 lb 12.8 oz) Exam:   General:  Pt is alert, follows commands appropriately, not in acute distress  HEENT: No icterus, No thrush, No neck  mass, [place]/AT  Cardiovascular: RRR, S1/S2, no rubs, no gallops  Respiratory: Bibasilar rales. bilateral wheezing. Good air movement.  Abdomen: Soft/+BS, non tender, non distended, no guarding  Extremities: 1 + LE edema, No lymphangitis, No petechiae, No rashes, no synovitis   Data Reviewed: I have personally reviewed following labs and imaging studies Basic Metabolic Panel:  Recent Labs Lab 04/18/16 0241 04/19/16 0204 04/20/16 0200 04/21/16 0523 04/22/16 0617 04/23/16 0442 04/24/16 0554  NA 141  --  137 137 139 139 137  K 4.0  --  4.4 4.2 3.5 3.7 3.6  CL 109  --  101 97* 93* 96* 92*  CO2 25  --  29 32 37* 29 34*  GLUCOSE 109*  --  141* 104* 95 104* 109*  BUN 11  --  11 21* 27* 27* 24*  CREATININE 0.80  --  0.80 0.71 0.90 0.88 0.86  CALCIUM 7.0*  --  7.8* 8.7* 8.7* 8.5* 8.4*  MG 1.7 1.8 1.9  --   --  2.0  --    Liver Function Tests:  Recent Labs Lab 04/18/16 0241 04/21/16 0523  AST 31 29  ALT 22 24  ALKPHOS 54 50  BILITOT 0.5 0.6  PROT 5.8* 6.0*  ALBUMIN 2.5* 2.6*   No results for input(s): LIPASE, AMYLASE in the last 168 hours. No results for input(s): AMMONIA in the last 168 hours. Coagulation Profile: No results for input(s): INR, PROTIME in the last 168 hours. CBC:  Recent Labs Lab 04/21/16 0523 04/22/16 0617 04/23/16 0442 04/24/16 0554  WBC 12.8* 15.4* 11.8* 10.7*  HGB 10.8* 11.8* 11.2* 11.8*  HCT 35.5* 38.7 35.9* 37.4  MCV 87.7 86.6 87.1 87.2  PLT 366 384 315 265   Cardiac Enzymes: No results for input(s): CKTOTAL, CKMB, CKMBINDEX, TROPONINI in the last 168 hours. BNP: Invalid input(s): POCBNP CBG: No results for input(s): GLUCAP in the last 168 hours. HbA1C: No results for input(s): HGBA1C in the last 72 hours. Urine analysis:    Component Value Date/Time   COLORURINE COLORLESS (A) 04/23/2016 1248   APPEARANCEUR CLEAR 04/23/2016 1248   LABSPEC 1.005 04/23/2016 1248   PHURINE 7.0 04/23/2016 1248   GLUCOSEU NEGATIVE 04/23/2016 1248    HGBUR SMALL (A) 04/23/2016 1248   BILIRUBINUR NEGATIVE 04/23/2016 1248   KETONESUR NEGATIVE 04/23/2016 1248   PROTEINUR NEGATIVE 04/23/2016 1248   NITRITE NEGATIVE 04/23/2016 1248   LEUKOCYTESUR LARGE (A) 04/23/2016 1248   Sepsis Labs: @LABRCNTIP(procalcitonin:4,lacticidven:4) ) Recent Results (from the past 240 hour(s))  Culture, blood (routine x 2)     Status: None   Collection Time: 04/15/16  2:06 AM  Result Value Ref Range Status   Specimen Description BLOOD RIGHT WRIST  Final   Special Requests IN PEDIATRIC BOTTLE 3ML  Final   Culture NO GROWTH 5 DAYS  Final   Report Status 04/20/2016 FINAL  Final  Culture, blood (routine x 2)     Status: None   Collection Time: 04/15/16  2:12 AM  Result Value Ref Range Status     Specimen Description BLOOD RIGHT HAND  Final   Special Requests BOTTLES DRAWN AEROBIC AND ANAEROBIC 5ML  Final   Culture NO GROWTH 5 DAYS  Final   Report Status 04/20/2016 FINAL  Final  Urine culture     Status: Abnormal   Collection Time: 04/15/16  9:47 AM  Result Value Ref Range Status   Specimen Description URINE, RANDOM  Final   Special Requests ADDED [id number]  Final   Culture (A)  Final    40,000 COLONIES/mL ESCHERICHIA COLI Confirmed Extended Spectrum Beta-Lactamase Producer (ESBL)    Report Status 04/18/2016 FINAL  Final   Organism ID, Bacteria ESCHERICHIA COLI (A)  Final      Susceptibility   Escherichia coli - MIC*    AMPICILLIN >=32 RESISTANT Resistant     CEFAZOLIN >=64 RESISTANT Resistant     CEFTRIAXONE 8 RESISTANT Resistant     CIPROFLOXACIN >=4 RESISTANT Resistant     GENTAMICIN >=16 RESISTANT Resistant     IMIPENEM <=0.25 SENSITIVE Sensitive     NITROFURANTOIN <=16 SENSITIVE Sensitive     TRIMETH/SULFA >=320 RESISTANT Resistant     AMPICILLIN/SULBACTAM >=32 RESISTANT Resistant     PIP/TAZO >=128 RESISTANT Resistant     Extended ESBL POSITIVE Resistant     * 40,000 COLONIES/mL ESCHERICHIA COLI  Urine culture     Status: None    Collection Time: 04/16/16  4:42 PM  Result Value Ref Range Status   Specimen Description URINE, CLEAN CATCH  Final   Special Requests PATIENT ON FOLLOWING ZOSYN  Final   Culture NO GROWTH  Final   Report Status 04/17/2016 FINAL  Final  Urine culture     Status: None   Collection Time: 04/23/16 12:48 PM  Result Value Ref Range Status   Specimen Description URINE, RANDOM  Final   Special Requests NONE  Final   Culture NO GROWTH  Final   Report Status 04/24/2016 FINAL  Final     Scheduled Meds: . aspirin  300 mg Rectal Daily  . budesonide (PULMICORT) nebulizer solution  0.5 mg Nebulization BID  . [START ON 04/25/2016] famotidine  20 mg Oral Daily  . heparin  5,000 Units Subcutaneous Q8H  . ipratropium-albuterol  3 mL Nebulization TID  . mouth rinse  15 mL Mouth Rinse BID  . meropenem (MERREM) IV  1 g Intravenous Q8H   Continuous Infusions:  Procedures/Studies: Ct Head Wo Contrast  Result Date: 04/09/2016 CLINICAL DATA:  82 y/o F; code stroke post transarterial intervention. EXAM: CT HEAD WITHOUT CONTRAST TECHNIQUE: Contiguous axial images were obtained from the base of the skull through the vertex without intravenous contrast. COMPARISON:  04/09/2016 CT head. FINDINGS: Brain: No large territory acute infarct or intracranial hemorrhage identified. Patchy foci of hypoattenuation in periventricular and subcortical white matter may represent areas of infarction or chronic microvascular ischemic changes and are stable. Mild parenchymal volume loss. No focal mass effect. No extra-axial collection. No effacement of basilar cisterns. Vascular: Persistent opacification from previous contrast administration. Calcific atherosclerosis of carotid siphons. Skull: Normal. Negative for fracture or focal lesion. Sinuses/Orbits: Partial opacification of ethmoid and sphenoid sinuses with aerosolized secretions and partial opacification of mastoid air cells probably related to intubation. Orbits are  unremarkable. Other: None. IMPRESSION: No large territory acute infarct or intracranial hemorrhage identified. Patchy foci of hypoattenuation in periventricular and subcortical white matter may represent areas of infarction or chronic microvascular ischemic changes and are stable. If clinically indicated MRI is more sensitive for acute ischemia. Electronically Signed     By: Lance  Furusawa-Stratton M.D.   On: 04/09/2016 22:51   Mr Mra Head Wo Contrast  Result Date: 04/10/2016 CLINICAL DATA:  88-year-old female status post neuro intervention on 04/09/2016 for acute onset right side weakness and inability to speak. Status post Bilateral cerebral angiogram with Revascularization of occluded LT MCA ,Lt ACA and Lt ICA terminus with x1 PASS WITH A COMBINATION OF solitaire 4mm x 40 mm retrieval device with simultaneous aspiration at the intermediate guide catheter and flow gate guide catheter, and 3.6 mg of superselective intracranial INTEGRELIN. Initial encounter. EXAM: MRI HEAD WITHOUT CONTRAST MRA HEAD WITHOUT CONTRAST TECHNIQUE: Multiplanar, multiecho pulse sequences of the brain and surrounding structures were obtained without intravenous contrast. Angiographic images of the head were obtained using MRA technique without contrast. COMPARISON:  Post tPA head CT 04/09/2016 FINDINGS: MRI HEAD FINDINGS Brain: Multiple small cortically based and white-matter foci of restricted diffusion in the left temporal lobe, best seen on axial diffusion weighted imaging (series 4, image 20). Evidence of petechial hemorrhage in the left mesial temporal lobe which appears to track to the left cauda thalamic groove where diffusion is also heterogeneous. These areas demonstrate mild T2 and FLAIR hyperintensity without mass effect. In addition there are several other widely scattered mostly cortically based foci of restricted diffusion in both MCA territories at over the superior convexities. Similar scattered small infarcts in the  left occipital lobe. One or 2 such areas in the left cerebellum. No other intracranial hemorrhage. No intracranial mass effect or ventriculomegaly. Superimposed patchy bilateral cerebral white matter T2 and FLAIR hyperintensity. No chronic cortical encephalomalacia. Right deep gray matter nuclei and brainstem are normal for age. Negative pituitary and cervicomedullary junction. Vascular: Major intracranial vascular flow voids are preserved. Skull and upper cervical spine: Negative. Normal bone marrow signal. Sinuses/Orbits: Normal orbits soft tissues. Trace paranasal sinus mucosal thickening. Mild mastoid effusions. Other: Negative scalp soft tissues. MRA HEAD FINDINGS Mildly motion degraded. Antegrade flow in the posterior circulation with mildly dominant distal right vertebral artery. Normal PICA origins. Patent vertebrobasilar junction. Patent basilar artery without stenosis. Patent SCA and normal right PCA origins. Fetal type left PCA origin. Right posterior communicating artery diminutive or absent. Absent flow signal in the inferior left PCA P3 division. Otherwise the bilateral PCA branches appear normal. Antegrade flow in both ICA siphons with some generalized ICA dolichoectasia and tortuosity of the distal cervical right ICA. Patent carotid termini. Normal MCA and ACA origins. Generalized ACA dolichoectasia. Proximal ACA branches appear normal. Both MCA bifurcations appear patent. MCA branch detail is degraded by motion. Fairly symmetric appearance of bilateral MCA branch flow signal. IMPRESSION: 1. Small volume of scattered acute infarcts in the left hemisphere primarily in the left temporal and occipital lobes (note fetal type left PCA origin anatomy). 2. Petechial hemorrhage in the mesial left temporal lobe tracking to the cauda thalamic groove. No associated mass effect and no malignant hemorrhagic transformation. 3. A small number of other scattered small infarcts in the bilateral MCA and left  cerebellar artery territories might be related to endovascular intervention. 4. Motion degraded intracranial MRA negative for emergent large vessel occlusion or proximal intracranial stenosis. Possible occlusion of the left PCA P3 inferior division. Electronically Signed   By: H  Hall M.D.   On: 04/10/2016 19:16   Mr Brain Wo Contrast  Result Date: 04/10/2016 CLINICAL DATA:  88-year-old female status post neuro intervention on 04/09/2016 for acute onset right side weakness and inability to speak. Status post Bilateral cerebral angiogram with Revascularization of   occluded LT MCA ,Lt ACA and Lt ICA terminus with x1 PASS WITH A COMBINATION OF solitaire 4mm x 40 mm retrieval device with simultaneous aspiration at the intermediate guide catheter and flow gate guide catheter, and 3.6 mg of superselective intracranial INTEGRELIN. Initial encounter. EXAM: MRI HEAD WITHOUT CONTRAST MRA HEAD WITHOUT CONTRAST TECHNIQUE: Multiplanar, multiecho pulse sequences of the brain and surrounding structures were obtained without intravenous contrast. Angiographic images of the head were obtained using MRA technique without contrast. COMPARISON:  Post tPA head CT 04/09/2016 FINDINGS: MRI HEAD FINDINGS Brain: Multiple small cortically based and white-matter foci of restricted diffusion in the left temporal lobe, best seen on axial diffusion weighted imaging (series 4, image 20). Evidence of petechial hemorrhage in the left mesial temporal lobe which appears to track to the left cauda thalamic groove where diffusion is also heterogeneous. These areas demonstrate mild T2 and FLAIR hyperintensity without mass effect. In addition there are several other widely scattered mostly cortically based foci of restricted diffusion in both MCA territories at over the superior convexities. Similar scattered small infarcts in the left occipital lobe. One or 2 such areas in the left cerebellum. No other intracranial hemorrhage. No intracranial mass  effect or ventriculomegaly. Superimposed patchy bilateral cerebral white matter T2 and FLAIR hyperintensity. No chronic cortical encephalomalacia. Right deep gray matter nuclei and brainstem are normal for age. Negative pituitary and cervicomedullary junction. Vascular: Major intracranial vascular flow voids are preserved. Skull and upper cervical spine: Negative. Normal bone marrow signal. Sinuses/Orbits: Normal orbits soft tissues. Trace paranasal sinus mucosal thickening. Mild mastoid effusions. Other: Negative scalp soft tissues. MRA HEAD FINDINGS Mildly motion degraded. Antegrade flow in the posterior circulation with mildly dominant distal right vertebral artery. Normal PICA origins. Patent vertebrobasilar junction. Patent basilar artery without stenosis. Patent SCA and normal right PCA origins. Fetal type left PCA origin. Right posterior communicating artery diminutive or absent. Absent flow signal in the inferior left PCA P3 division. Otherwise the bilateral PCA branches appear normal. Antegrade flow in both ICA siphons with some generalized ICA dolichoectasia and tortuosity of the distal cervical right ICA. Patent carotid termini. Normal MCA and ACA origins. Generalized ACA dolichoectasia. Proximal ACA branches appear normal. Both MCA bifurcations appear patent. MCA branch detail is degraded by motion. Fairly symmetric appearance of bilateral MCA branch flow signal. IMPRESSION: 1. Small volume of scattered acute infarcts in the left hemisphere primarily in the left temporal and occipital lobes (note fetal type left PCA origin anatomy). 2. Petechial hemorrhage in the mesial left temporal lobe tracking to the cauda thalamic groove. No associated mass effect and no malignant hemorrhagic transformation. 3. A small number of other scattered small infarcts in the bilateral MCA and left cerebellar artery territories might be related to endovascular intervention. 4. Motion degraded intracranial MRA negative for  emergent large vessel occlusion or proximal intracranial stenosis. Possible occlusion of the left PCA P3 inferior division. Electronically Signed   By: H  Hall M.D.   On: 04/10/2016 19:16   Dg Chest Port 1 View  Result Date: 04/23/2016 CLINICAL DATA:  Aspiration pneumonia EXAM: PORTABLE CHEST 1 VIEW COMPARISON:  04/23/2016 at 12:20 FINDINGS: Mild streaky opacity in the right days. Left lung is clear. Pulmonary vasculature is normal. No effusions. Hilar, mediastinal and cardiac contours are unremarkable and unchanged. IMPRESSION: Streaky right base opacity. No focal confluent consolidation. No significant change in the interim. Electronically Signed   By: Daniel R Mitchell M.D.   On: 04/23/2016 23:41   Dg Chest Port 1 View    Result Date: 04/23/2016 CLINICAL DATA:  COPD, aspiration pneumonia. EXAM: PORTABLE CHEST 1 VIEW COMPARISON:  Portable chest x-ray of Sep 20, 2015 FINDINGS: The lungs are mildly hyperinflated. The interstitial markings are coarse. Increased density at the right lung base is consistent with the clinically suspected aspiration pneumonia. The heart and pulmonary vascularity are normal. There is calcification in the wall of the tortuous thoracic aorta. There is no pleural effusion. The bony thorax exhibits no acute abnormality. IMPRESSION: Right basilar pneumonia consistent with aspiration. COPD. Thoracic aortic atherosclerosis. When the patient can tolerate the procedure, a PA and lateral chest x-ray would be useful. Electronically Signed   By:   Jordan M.D.   On: 04/23/2016 12:34   Dg Chest Port 1 View  Result Date: 04/21/2016 CLINICAL DATA:  Initial evaluation for acute respiratory distress, wheezing. History of COPD. EXAM: PORTABLE CHEST 1 VIEW COMPARISON:  Prior radiograph from 04/21/2016. FINDINGS: Cardiac and mediastinal silhouettes are stable in size and contour, and remain within normal limits. Atherosclerotic disease noted within the aortic arch. Lungs are hypoinflated.  Changes related COPD noted. Increased right basilar opacity favored to reflect atelectasis/bronchovascular crowding, although recurrent infiltrate not excluded. Left basilar atelectasis noted. Small left pleural effusion. No pulmonary edema or pneumothorax. No acute osseous abnormality. IMPRESSION: 1. Shallow lung inflation with increased right basilar opacity, favored to reflect atelectasis/ bronchovascular crowding, although recurrent infiltrate could be considered in the correct clinical setting. 2. Small left pleural effusion with associated atelectasis. 3. COPD. Electronically Signed   By: Benjamin  McClintock M.D.   On: 04/21/2016 20:38   Dg Chest Port 1 View  Result Date: 04/21/2016 CLINICAL DATA:  Follow-up wheezing, COPD. EXAM: PORTABLE CHEST 1 VIEW COMPARISON:  04/20/2016 FINDINGS: There is hyperinflation of the lungs compatible with COPD. Heart and mediastinal contours are within normal limits. No focal opacities or effusions. No acute bony abnormality. IMPRESSION: COPD.  No active disease. Electronically Signed   By: Kevin  Dover M.D.   On: 04/21/2016 08:27   Dg Chest Port 1 View  Result Date: 04/20/2016 CLINICAL DATA:  Pneumonia. EXAM: PORTABLE CHEST 1 VIEW COMPARISON:  04/16/2016. FINDINGS: Mediastinum and hilar structures are normal. Heart size stable. Low lung volumes with basilar atelectasis. Slight improvement of right base infiltrate. A component of right base bronchiectasis cannot be excluded. Tiny left pleural effusion. No pneumothorax. IMPRESSION: 1. Interim partial clearing of right base infiltrate. A component of bronchiectasis in the right base cannot be excluded. 2. Low lung volumes.  Small left pleural effusion noted. Electronically Signed   By: Thomas  Register   On: 04/20/2016 07:20   Dg Chest Port 1 View  Result Date: 04/16/2016 CLINICAL DATA:  Acute onset of shortness of breath. Initial encounter. EXAM: PORTABLE CHEST 1 VIEW COMPARISON:  Chest radiograph performed  04/14/2016 FINDINGS: Persistent right basilar airspace opacity remains concerning for pneumonia. No definite pleural effusion or pneumothorax is seen. Mild vascular congestion is noted. The cardiomediastinal silhouette is mildly enlarged. No acute osseous abnormalities are identified. IMPRESSION: 1. Persistent right basilar airspace opacity remains concerning for pneumonia. 2. Mild vascular congestion and mild cardiomegaly. Electronically Signed   By: Jeffery  Chang M.D.   On: 04/16/2016 06:26   Dg Chest Port 1 View  Result Date: 04/14/2016 CLINICAL DATA:  Acute onset of shortness of breath and cough. Initial encounter. EXAM: PORTABLE CHEST 1 VIEW COMPARISON:  Chest radiograph performed 04/11/2016 FINDINGS: Right basilar airspace opacity raises concern for pneumonia. No pleural effusion or pneumothorax is seen. The cardiomediastinal silhouette is borderline   normal in size. No acute osseous abnormalities are identified. IMPRESSION: Right basilar pneumonia noted. Electronically Signed   By: Jeffery  Chang M.D.   On: 04/14/2016 21:50   Dg Chest Port 1 View  Result Date: 04/11/2016 CLINICAL DATA:  88-year-old female with respiratory failure EXAM: PORTABLE CHEST 1 VIEW COMPARISON:  Prior chest x-ray 04/09/2016 FINDINGS: The patient has been extubated and the nasogastric tube removed. Inspiratory volumes are lower. Nonspecific patchy opacities in the right greater than left base. Stable cardiomegaly. Mild pulmonary vascular congestion without edema. Atherosclerotic, ectatic and tortuous thoracic aorta. Right IJ approach central venous catheter remains in unchanged position. The tip of the catheter is at the superior cavoatrial junction. Osseous structures are intact and unremarkable. IMPRESSION: 1. Interval extubation and removal of nasogastric tube. Right IJ central venous catheter remains in stable and satisfactory position. 2. Lower inspiratory volumes with increased bibasilar atelectasis and pulmonary  vascular congestion. 3.  Aortic Atherosclerosis (ICD10-170.0) Electronically Signed   By: Heath  McCullough M.D.   On: 04/11/2016 08:12   Dg Chest Port 1 View  Result Date: 04/10/2016 CLINICAL DATA:  Endotracheal tube and orogastric tube placement. Initial encounter. EXAM: PORTABLE CHEST 1 VIEW COMPARISON:  None. FINDINGS: The patient's endotracheal tube is seen ending 2 cm above the carina. A right IJ line is noted ending about the distal SVC. An enteric tube is noted extending below the diaphragm. Mild bibasilar atelectasis is noted. No pleural effusion or pneumothorax is seen. The cardiomediastinal silhouette is borderline normal in size. No acute osseous abnormalities are identified. IMPRESSION: 1. Endotracheal tube seen ending 2 cm above the carina. 2. Right IJ line noted ending about the distal SVC. 3. Enteric tube noted extending below the diaphragm. 4. Mild bibasilar atelectasis. Electronically Signed   By: Jeffery  Chang M.D.   On: 04/10/2016 00:02   Dg Abd Portable 1v  Result Date: 04/09/2016 CLINICAL DATA:  82 y/o  F; OG tube placement. EXAM: PORTABLE ABDOMEN - 1 VIEW COMPARISON:  None. FINDINGS: Patient is rotated. Normal bowel gas pattern. Enteric tube tip is below the diaphragm probably in the distal stomach. Slight densities projecting over the kidneys bilaterally may represent stones. Degenerative changes of the spine. IMPRESSION: Enteric tube tip probably in distal stomach. Densities projecting over kidneys may represent nephrolithiasis. Electronically Signed   By: Lance  Furusawa-Stratton M.D.   On: 04/09/2016 23:56   Dg Swallowing Func-speech Pathology  Result Date: 04/17/2016 Objective Swallowing Evaluation: Type of Study: MBS-Modified Barium Swallow Study Patient Details Name: Erva Cruzan MRN: [id number] Date of Birth: 06/16/1927 Today's Date: 04/17/2016 Time: SLP Start Time (ACUTE ONLY): 1045-SLP Stop Time (ACUTE ONLY): 1115 SLP Time Calculation (min) (ACUTE ONLY): 30 min Past Medical  History: Past Medical History: Diagnosis Date . COPD (chronic obstructive pulmonary disease) (HCC)  Past Surgical History: Past Surgical History: Procedure Laterality Date . IR GENERIC HISTORICAL  04/09/2016  IR ANGIO INTRA EXTRACRAN SEL COM CAROTID INNOMINATE UNI R MOD SED 04/09/2016 Sanjeev Deveshwar, MD [facility] . IR GENERIC HISTORICAL  04/09/2016  IR PERCUTANEOUS ART THROMBECTOMY/INFUSION INTRACRANIAL INC DIAG ANGIO 04/09/2016 Sanjeev Deveshwar, MD [facility] . PELVIC FRACTURE SURGERY   . RADIOLOGY WITH ANESTHESIA N/A 04/09/2016  Procedure: RADIOLOGY WITH ANESTHESIA;  Surgeon: Sanjeev Deveshwar, MD;  Location: [facility];  Service: Radiology;  Laterality: N/A; . TEE WITHOUT CARDIOVERSION N/A 04/13/2016  Procedure: TRANSESOPHAGEAL ECHOCARDIOGRAM (TEE);  Surgeon: Mihai Croitoru, MD;  Location: [facility];  Service: Cardiovascular;  Laterality: N/A; HPI: 82 y.o. right handed female with h/o COPD. Presented 04/10/2016   with sudden onset of right-sided weakness and inability to speak. 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 Pt discharged to [facility] 11/28, however, later that night she became dyspneic and diaphoretic. She was given albuterol nebs with some improvement. CXR with RLL infiltrate. Subjective: pt awake, pleasant Assessment / Plan / Recommendation CHL IP CLINICAL IMPRESSIONS 04/17/2016 Therapy Diagnosis Mild oral phase dysphagia;Mild pharyngeal phase dysphagia Clinical Impression Pt's swallow function demonstrates mild improvements since [facility] 11/26.  She presents with piecemeal oral bolusing; persisting delay in initiation (valleculae and pyriforms) -however, this is not necessarily considered disordered in the elderly population.  There may have been questionable, high penetration of liquids, but it was difficult to discern.  No aspiration. Pyriform sinuses appear to  be asymmetric and there is mild residue remaining post-swallow. Recommend advancing diet to dysphagia 3, nectar-thick liquids for now.  Will follow for functional toleration/safety.   Impact on safety and function Mild aspiration risk   CHL IP TREATMENT RECOMMENDATION 04/17/2016 Treatment Recommendations Therapy as outlined in treatment plan below   Prognosis 04/17/2016 Prognosis for Safe Diet Advancement Good Barriers to Reach Goals -- Barriers/Prognosis Comment -- CHL IP DIET RECOMMENDATION 04/17/2016 SLP Diet Recommendations Dysphagia 3 (Mech soft) solids;Nectar thick liquid Liquid Administration via Cup Medication Administration Whole meds with puree Compensations Small sips/bites;Slow rate Postural Changes --   CHL IP OTHER RECOMMENDATIONS 04/17/2016 Recommended Consults -- Oral Care Recommendations Oral care BID Other Recommendations --   CHL IP FOLLOW UP RECOMMENDATIONS 04/17/2016 Follow up Recommendations Inpatient Rehab   CHL IP FREQUENCY AND DURATION 04/17/2016 Speech Therapy Frequency (ACUTE ONLY) min 2x/week Treatment Duration 2 weeks      CHL IP ORAL PHASE 04/17/2016 Oral Phase Impaired Oral - Pudding Teaspoon -- Oral - Pudding Cup -- Oral - Honey Teaspoon -- Oral - Honey Cup -- Oral - Nectar Teaspoon -- Oral - Nectar Cup -- Oral - Nectar Straw -- Oral - Thin Teaspoon -- Oral - Thin Cup -- Oral - Thin Straw -- Oral - Puree Piecemeal swallowing Oral - Mech Soft Piecemeal swallowing Oral - Regular -- Oral - Multi-Consistency -- Oral - Pill -- Oral Phase - Comment --  CHL IP PHARYNGEAL PHASE 04/17/2016 Pharyngeal Phase Impaired Pharyngeal- Pudding Teaspoon -- Pharyngeal -- Pharyngeal- Pudding Cup -- Pharyngeal -- Pharyngeal- Honey Teaspoon -- Pharyngeal -- Pharyngeal- Honey Cup NT Pharyngeal -- Pharyngeal- Nectar Teaspoon -- Pharyngeal -- Pharyngeal- Nectar Cup Pharyngeal residue - pyriform;Delayed swallow initiation-pyriform sinuses Pharyngeal Material enters airway, remains ABOVE vocal cords then ejected out  Pharyngeal- Nectar Straw -- Pharyngeal -- Pharyngeal- Thin Teaspoon -- Pharyngeal -- Pharyngeal- Thin Cup Delayed swallow initiation-pyriform sinuses;Pharyngeal residue - pyriform Pharyngeal Material enters airway, remains ABOVE vocal cords then ejected out Pharyngeal- Thin Straw -- Pharyngeal -- Pharyngeal- Puree Delayed swallow initiation-vallecula;Pharyngeal residue - pyriform Pharyngeal -- Pharyngeal- Mechanical Soft Delayed swallow initiation-vallecula;Pharyngeal residue - pyriform Pharyngeal -- Pharyngeal- Regular -- Pharyngeal -- Pharyngeal- Multi-consistency -- Pharyngeal -- Pharyngeal- Pill -- Pharyngeal -- Pharyngeal Comment --  CHL IP CERVICAL ESOPHAGEAL PHASE 04/12/2016 Cervical Esophageal Phase WFL Pudding Teaspoon -- Pudding Cup -- Honey Teaspoon -- Honey Cup -- Nectar Teaspoon -- Nectar Cup -- Nectar Straw -- Thin Teaspoon -- Thin Cup -- Thin Straw -- Puree -- Mechanical Soft -- Regular -- Multi-consistency -- Pill -- Cervical Esophageal Comment -- No flowsheet data found. Juan Quam Laurice 04/17/2016, 11:50 AM  Dg Swallowing Func-speech Pathology  Result Date: 04/12/2016 Objective Swallowing Evaluation: Type of Study: MBS-Modified Barium Swallow Study Patient Details Name: Adeja Zawistowski MRN: [id number] Date of Birth: 07/18/1927 Today's Date: 04/12/2016 Time: SLP Start Time (ACUTE ONLY): 1116-SLP Stop Time (ACUTE ONLY): 1131 SLP Time Calculation (min) (ACUTE ONLY): 15 min Past Medical History: Past Medical History: Diagnosis Date . COPD (chronic obstructive pulmonary disease) (HCC)  Past Surgical History: Past Surgical History: Procedure Laterality Date . PELVIC FRACTURE SURGERY   . RADIOLOGY WITH ANESTHESIA N/A 04/09/2016  Procedure: RADIOLOGY WITH ANESTHESIA;  Surgeon: Sanjeev Deveshwar, MD;  Location: [facility] OR;  Service: Radiology;  Laterality: N/A; HPI: Ms Beier is an 82 yo female with h/o COPD admitted with mental status change, right sided weakness, hemianopsia, left gaze  preference,  Pt required intubation and was extubated yesterday.  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  Swallow and speech eval ordered Subjective: pt awake, pleasant Assessment / Plan / Recommendation CHL IP CLINICAL IMPRESSIONS 04/12/2016 Therapy Diagnosis Moderate pharyngeal phase dysphagia Clinical Impression Pt has a moderate pharyngeal dysphagia due in large part to delayed swallow initiation and decreased sensation of airway penetration. Liquids spill to the pyriform sinuses, resulting in deep, silent penetration of thin and nectar thick liquids. Although this does not occur with every bolus, it is silent and unable to be completely cleared with cued coughing, making her at a higher risk for an aspiration-related infection. Her strength is adequate with no significant residuals remaining post-swallow. Recommend Dys 3 diet and honey thick liquids. SLP to f/u for tolerance and readiness to advance. Impact on safety and function Moderate aspiration risk   CHL IP TREATMENT RECOMMENDATION 04/12/2016 Treatment Recommendations Therapy as outlined in treatment plan below   Prognosis 04/12/2016 Prognosis for Safe Diet Advancement Good Barriers to Reach Goals -- Barriers/Prognosis Comment -- CHL IP DIET RECOMMENDATION 04/12/2016 SLP Diet Recommendations Dysphagia 3 (Mech soft) solids;Honey thick liquids Liquid Administration via Cup;No straw Medication Administration Whole meds with puree Compensations Slow rate;Small sips/bites Postural Changes Remain semi-upright after after feeds/meals (Comment);Seated upright at 90 degrees   CHL IP OTHER RECOMMENDATIONS 04/12/2016 Recommended Consults -- Oral Care Recommendations Oral care BID Other Recommendations Order thickener from pharmacy;Prohibited food (jello, ice cream, thin soups);Remove water pitcher   CHL IP FOLLOW UP RECOMMENDATIONS 04/12/2016 Follow up Recommendations  Inpatient Rehab   CHL IP FREQUENCY AND DURATION 04/12/2016 Speech Therapy Frequency (ACUTE ONLY) min 2x/week Treatment Duration 2 weeks      CHL IP ORAL PHASE 04/12/2016 Oral Phase WFL Oral - Pudding Teaspoon -- Oral - Pudding Cup -- Oral - Honey Teaspoon -- Oral - Honey Cup -- Oral - Nectar Teaspoon -- Oral - Nectar Cup -- Oral - Nectar Straw -- Oral - Thin Teaspoon -- Oral - Thin Cup -- Oral - Thin Straw -- Oral - Puree -- Oral - Mech Soft -- Oral - Regular -- Oral - Multi-Consistency -- Oral - Pill -- Oral Phase - Comment --  CHL IP PHARYNGEAL PHASE 04/12/2016 Pharyngeal Phase Impaired Pharyngeal- Pudding Teaspoon -- Pharyngeal -- Pharyngeal- Pudding Cup -- Pharyngeal -- Pharyngeal- Honey Teaspoon -- Pharyngeal -- Pharyngeal- Honey Cup Delayed swallow initiation-pyriform sinuses Pharyngeal -- Pharyngeal- Nectar Teaspoon -- Pharyngeal -- Pharyngeal- Nectar Cup Delayed swallow initiation-pyriform sinuses;Penetration/Aspiration before swallow Pharyngeal Material enters airway, remains ABOVE vocal cords and not ejected out Pharyngeal- Nectar Straw -- Pharyngeal -- Pharyngeal- Thin Teaspoon -- Pharyngeal -- Pharyngeal- Thin Cup Delayed swallow   initiation-pyriform sinuses;Penetration/Aspiration before swallow Pharyngeal Material enters airway, CONTACTS cords and not ejected out Pharyngeal- Thin Straw -- Pharyngeal -- Pharyngeal- Puree Delayed swallow initiation-vallecula Pharyngeal -- Pharyngeal- Mechanical Soft Delayed swallow initiation-vallecula Pharyngeal -- Pharyngeal- Regular -- Pharyngeal -- Pharyngeal- Multi-consistency -- Pharyngeal -- Pharyngeal- Pill -- Pharyngeal -- Pharyngeal Comment --  CHL IP CERVICAL ESOPHAGEAL PHASE 04/12/2016 Cervical Esophageal Phase WFL Pudding Teaspoon -- Pudding Cup -- Honey Teaspoon -- Honey Cup -- Nectar Teaspoon -- Nectar Cup -- Nectar Straw -- Thin Teaspoon -- Thin Cup -- Thin Straw -- Puree -- Mechanical Soft -- Regular -- Multi-consistency -- Pill -- Cervical Esophageal  Comment -- No flowsheet data found. Paiewonsky, Laura 04/12/2016, 1:05 PM  [name], M.A. CCC-SLP [phone]             Ir Percutaneous Art Thrombectomy/infusion Intracranial Inc Diag Angio  Result Date: 04/13/2016 CLINICAL DATA:  Right-sided hemiplegia, left gaze preference, confusion, global aphasia. EXAM: IR PERCUTANEOUS ART THORMBECTOMY/INFUSION INTRACRANIAL INCLUDE DIAG ANGIO; IR ANGIO INTRA EXTRACRAN SEL COM CAROTID INNOMINATE UNI RIGHT MOD SED PROCEDURE: Following a full explanation of the procedure along with the potential associated complications, an informed witnessed consent was obtained. Risks of intracranial hemorrhage of 10-15%, worsening neurological deficit, death, and the inability to revascularize were all reviewed in detail with the patient's daughter. Informed consent was obtained. The patient was then put under general anesthesia by the Department of Anesthesiology at [facility]. The right groin was prepped and draped in the usual sterile fashion. Thereafter using modified Seldinger technique, transfemoral access into the right common femoral artery was obtained without difficulty. Over a 0.035 inch guidewire a 5 French Pinnacle sheath was inserted. Through this, and also over a 0.035 inch guidewire a 5 French JB 1 catheter was advanced to the aortic arch region and selectively positioned in the left common carotid artery and later in the right common carotid artery. There were no acute complications. The patient tolerated the procedure well. Contrast: Isovue 300 approximately 70 mL. Anesthesia/Sedation:  General anesthesia. Medications: As per general anesthesia. FINDINGS: The right common carotid arteriogram demonstrates moderate to severe tortuosity of the left common carotid artery in its proximal portion with acute angular takeoff towards the aortic arch. Slow flow was seen ascending to the left internal carotid artery. There was no angiographic visualization of  the left external carotid artery consistent with occlusion. The left internal carotid artery at the bulb demonstrated no occlusions with wide patency. 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 The right common carotid arteriogram demonstrates the right external carotid artery and its major branches to be widely patent. The right internal carotid at the bulb and just distally appears widely patent. Focal areas of focal outpouching are seen to emanate from the posterior wall of the right internal carotid artery in the proximal one-third. Distal to this there is a double U shaped tortuosity of the mid left internal carotid artery. Distal to this the left internal carotid artery is seen to opacify normally to the cranial skull base. There is mild fusiform prominence of the petrous cavernous junction extending into the proximal cavernous artery. The distal cavernous carotid and supraclinoid segments are widely patent. There is a mild focal bulge noted at the level of the right posterior communicating artery. The right middle cerebral artery and the right anterior cerebral artery opacify normally into the capillary and venous phases.   ENDOVASCULAR COMPLETE REVASCULARIZATION OF OCCLUDED LEFT INTERNAL CAROTID ARTERY TERMINUS, THE LEFT MIDDLE CEREBRAL ARTERY AND LEFT ANTERIOR CEREBRAL ARTERY. The diagnostic JB 1 catheter in the left common carotid artery was then exchanged over a 0.035 inch 300 cm Rosen exchange guidewire for a 8 French 55 cm Brite tip neurovascular sheath using biplane roadmap technique and constant fluoroscopic guidance. Good aspiration obtained from the side port of the neurovascular sheath. This was then connected to continuous heparinized saline infusion. Over the [facility] exchange guidewire the distal end of which was at the origin of the left external carotid artery an 8 French  85 cm FlowGate balloon guide catheter which had been prepped with 50% contrast and 50% heparinized saline infusion was then advanced to just the origin of left internal carotid artery. The guidewire was removed. Good aspiration was obtained from the hub of the 8 [place] FlowGate guide catheter. Gentle contrast injection demonstrated no evidence of dissections or of intraluminal filling defects. Over a 0.014 inch Softip Synchro micro guidewire, a 021 Trevo ProVue micro catheter was then advanced to just distal to the origin of the 85 cm 8 [place] FlowGate guide catheter. The micro catheter and micro guidewire were advanced without difficulty to the cranial skull base. However, further advancement of the micro catheter was met with herniation of the entire platform into the aortic arch. The entire system was then removed and replaced with a 8 French 80 cm Arrow neurovascular sheath which was positioned over an exchange micro guidewire just proximal to the origin of the left internal carotid artery. Over the [employer] guidewire, the 8 [place] 80 cm Arrow neurovascular sheath, the 8 French 85 cm FlowGate guide catheter was then advanced and positioned just proximal to the left internal carotid artery origin. The guidewire was removed. Good aspiration obtained from the hub of the 8 [place] FlowGate guide catheter. Gentle contrast injection demonstrated no evidence of spasms, dissections or of intraluminal filling defects. 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 Over a 0.014 inch Softip Synchro micro guidewire which had a J-tip configuration, the combination of an intermediary 6 French 132 cm Catalyst guide catheter inside of which was a Trevo ProVue micro catheter, the combination was advanced without difficulty to the supraclinoid left ICA. The micro guidewire was then gently advanced through the occluded left middle  cerebral artery into the M2 M3 regions of the inferior division followed by the micro catheter and also followed by the 6 French 32 cm Catalyst guide catheter into the proximal left M1 segment. The micro guidewire was then removed. Aspiration was obtained from the hub of the micro catheter. Approximately 3.6 mg of arterial Integrilin were infused 1.8 mg through the micro catheter distally, and 1.8 mg through the 6 [place] Catalyst guide catheter. At this time, a 4 mm x 40 mm Solitaire FR retrieval device which had been prepped and purged with heparinized saline infusion was advanced to the distal end of the micro catheter in a coaxial manner and with constant heparinized saline infusion. The O rings on delivery micro catheter were then loosened. With slight forward gentle traction with the right hand on the delivery micro guidewire, with the left hand the delivery micro catheter was retrieved until it had been deployed. At this time, the 6 [place] Catalyst 132 cm guide catheter was advanced to just inside the proximal portion of the retrieval device. The occlusion balloon at  the origin of the left internal carotid artery was then expanded. Also a 20 mL syringe was hooked to the hub of the 6 French Catalyst guide catheter and a constant aspiration was then applied. Thereon after, the combination of the retrieval device, with the micro catheter and the 6 French guide catheter were then gently retrieved with constant fluoroscopic guidance and removed. Aspiration was continued with a 60 mL syringe at the hub of the 8 French FlowGate guide catheter which was then gently deflated. Free back bleed at the hub of the 8 French FlowGate guide catheter was achieved. The aspirate contained multiple focal chunks of grayish bloody thrombus. Similar thrombus was also noted within the 8 French Catalyst guide catheter. Clots were also noted in the interstices of the retrieval device. After having obtained free aspiration of blood at  the hub of the 8 French FlowGate guide catheter, control arteriogram performed through the FlowGate guide catheter in the left internal carotid artery demonstrated brisk antegrade flow to the cranial skull base and into the left middle cerebral artery and left anterior cerebral artery distributions to the distal distributions. No angiographic filling defects or occlusions were seen. There was no evidence of spasm either. No mass-effect or midline shift was noted of the major vessels intracranially. The patient's hemodynamic and neurologic status remained stable. A TICI 3 reperfusion had been obtained. The 8 French FlowGate guide catheter and the 8 French 80 cm Arrow neurovascular sheath were then retrieved into the abdominal aorta and exchanged over a J-tiip guidewire for a 9 French Pinnacle sheath. This in turn was then connected to continuous heparinized saline infusion. The patient's distal pulses remained unchanged Dopplerable early compared to prior to the procedure. The right groin appeared soft as did the left groin at the site of the applied sheath. The patient was transported to the CT scanner for postprocedural CT scan of brain. IMPRESSION: Status post endovascular complete revascularization of occluded left internal carotid artery terminus, the left middle cerebral artery and left anterior cerebral artery with 1 pass with the Solitaire 4 mm x 40 mm retrieval device in combination with aspiration of the hub of the intermediary catheter and proximal flow arrest, achieving a TICI 3 reperfusion as described above. Electronically Signed   By: Sanjeev  Deveshwar M.D.   On: 04/10/2016 13:15   Ct Head Code Stroke Wo Contrast`  Result Date: 04/09/2016 CLINICAL DATA:  Code stroke for right-sided weakness and loss of speech. EXAM: CT HEAD WITHOUT CONTRAST TECHNIQUE: Contiguous axial images were obtained from the base of the skull through the vertex without intravenous contrast. COMPARISON:  None. FINDINGS:  Brain: No evidence of acute infarction, hemorrhage, hydrocephalus, extra-axial collection or mass lesion/mass effect. Vascular: Atherosclerotic calcification.  No hyperdense vessel. Skull: Negative Sinuses/Orbits: Mild chronic sinusitis and left mastoiditis. Other: Results were called by telephone at the time of interpretation on 04/09/2016 at 6:41 pm to Dr. Nath, who verbally acknowledged these results. ASPECTS ([place]ber[place] Stroke Program Early CT Score) - Ganglionic level infarction (caudate, lentiform nuclei, internal capsule, insula, M1-M3 cortex): 7 - Supraganglionic infarction (M4-M6 cortex): 3 Total score (0-10 with 10 being normal): 10 IMPRESSION: No acute finding. ASPECTS is 10. Electronically Signed   By: Jonathon  Watts M.D.   On: 04/09/2016 18:43   Ir Angio Intra Extracran Sel Com Carotid Innominate Uni R Mod Sed  Result Date: 04/13/2016 CLINICAL DATA:  Right-sided hemiplegia, left gaze preference, confusion, global aphasia. EXAM: IR PERCUTANEOUS ART THORMBECTOMY/INFUSION INTRACRANIAL INCLUDE DIAG ANGIO; IR ANGIO INTRA EXTRACRAN   SEL COM CAROTID INNOMINATE UNI RIGHT MOD SED PROCEDURE: Following a full explanation of the procedure along with the potential associated complications, an informed witnessed consent was obtained. Risks of intracranial hemorrhage of 10-15%, worsening neurological deficit, death, and the inability to revascularize were all reviewed in detail with the patient's daughter. Informed consent was obtained. The patient was then put under general anesthesia by the Department of Anesthesiology at [facility]. The right groin was prepped and draped in the usual sterile fashion. Thereafter using modified Seldinger technique, transfemoral access into the right common femoral artery was obtained without difficulty. Over a 0.035 inch guidewire a 5 French Pinnacle sheath was inserted. Through this, and also over a 0.035 inch guidewire a 5 French JB 1 catheter was advanced to the aortic  arch region and selectively positioned in the left common carotid artery and later in the right common carotid artery. There were no acute complications. The patient tolerated the procedure well. Contrast: Isovue 300 approximately 70 mL. Anesthesia/Sedation:  General anesthesia. Medications: As per general anesthesia. FINDINGS: The right common carotid arteriogram demonstrates moderate to severe tortuosity of the left common carotid artery in its proximal portion with acute angular takeoff towards the aortic arch. Slow flow was seen ascending to the left internal carotid artery. There was no angiographic visualization of the left external carotid artery consistent with occlusion. The left internal carotid artery at the bulb demonstrated no occlusions with wide patency. 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 The right common carotid arteriogram demonstrates the right external carotid artery and its major branches to be widely patent. The right internal carotid at the bulb and just distally appears widely patent. Focal areas of focal outpouching are seen to emanate from the posterior wall of the right internal carotid artery in the proximal one-third. Distal to this there is a double U shaped tortuosity of the mid left internal carotid artery. Distal to this the left internal carotid artery is seen to opacify normally to the cranial skull base. There is mild fusiform prominence of the petrous cavernous junction extending into the proximal cavernous artery. The distal cavernous carotid and supraclinoid segments are widely patent. There is a mild focal bulge noted at the level of the right posterior communicating artery. The right middle cerebral artery and the right anterior cerebral artery opacify normally into the capillary and venous phases. ENDOVASCULAR COMPLETE REVASCULARIZATION OF  OCCLUDED LEFT INTERNAL CAROTID ARTERY TERMINUS, THE LEFT MIDDLE CEREBRAL ARTERY AND LEFT ANTERIOR CEREBRAL ARTERY. The diagnostic JB 1 catheter in the left common carotid artery was then exchanged over a 0.035 inch 300 cm Rosen exchange guidewire for a 8 French 55 cm Brite tip neurovascular sheath using biplane roadmap technique and constant fluoroscopic guidance. Good aspiration obtained from the side port of the neurovascular sheath. This was then connected to continuous heparinized saline infusion. Over the Rosen exchange guidewire the distal end of which was at the origin of the left external carotid artery an 8 French 85 cm FlowGate balloon guide catheter which had been prepped with 50% contrast and 50% heparinized saline infusion was then advanced to just the origin of left internal carotid artery. The guidewire was removed. Good aspiration was obtained from the hub of the 8 French FlowGate guide catheter. Gentle contrast injection demonstrated no evidence of dissections or of intraluminal filling defects. Over a 0.014 inch Softip Synchro micro   guidewire, a 021 Trevo ProVue micro catheter was then advanced to just distal to the origin of the 85 cm 8 French FlowGate guide catheter. The micro catheter and micro guidewire were advanced without difficulty to the cranial skull base. However, further advancement of the micro catheter was met with herniation of the entire platform into the aortic arch. The entire system was then removed and replaced with a 8 French 80 cm Arrow neurovascular sheath which was positioned over an exchange micro guidewire just proximal to the origin of the left internal carotid artery. Over the Rosen exchange guidewire, the 8 French 80 cm Arrow neurovascular sheath, the 8 French 85 cm FlowGate guide catheter was then advanced and positioned just proximal to the left internal carotid artery origin. The guidewire was removed. Good aspiration obtained from the hub of the 8 French FlowGate  guide catheter. Gentle contrast injection demonstrated no evidence of spasms, dissections or of intraluminal filling defects. 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 Over a 0.014 inch Softip Synchro micro guidewire which had a J-tip configuration, the combination of an intermediary 6 French 132 cm Catalyst guide catheter inside of which was a Trevo ProVue micro catheter, the combination was advanced without difficulty to the supraclinoid left ICA. The micro guidewire was then gently advanced through the occluded left middle cerebral artery into the M2 M3 regions of the inferior division followed by the micro catheter and also followed by the 6 French 32 cm Catalyst guide catheter into the proximal left M1 segment. The micro guidewire was then removed. Aspiration was obtained from the hub of the micro catheter. Approximately 3.6 mg of arterial Integrilin were infused 1.8 mg through the micro catheter distally, and 1.8 mg through the 6 French Catalyst guide catheter. At this time, a 4 mm x 40 mm Solitaire FR retrieval device which had been prepped and purged with heparinized saline infusion was advanced to the distal end of the micro catheter in a coaxial manner and with constant heparinized saline infusion. The O rings on delivery micro catheter were then loosened. With slight forward gentle traction with the right hand on the delivery micro guidewire, with the left hand the delivery micro catheter was retrieved until it had been deployed. At this time, the 6 French Catalyst 132 cm guide catheter was advanced to just inside the proximal portion of the retrieval device. The occlusion balloon at the origin of the left internal carotid artery was then expanded. Also a 20 mL syringe was hooked to the hub of the 6 French Catalyst guide catheter and a constant aspiration was then applied. Thereon after, the combination of  the retrieval device, with the micro catheter and the 6 French guide catheter were then gently retrieved with constant fluoroscopic guidance and removed. Aspiration was continued with a 60 mL syringe at the hub of the 8 French FlowGate guide catheter which was then gently deflated. Free back bleed at the hub of the 8 French FlowGate guide catheter was achieved. The aspirate contained multiple focal chunks of grayish bloody thrombus. Similar thrombus was also noted within the 8 French Catalyst guide catheter. Clots were also noted in the interstices of the retrieval device. After having obtained free aspiration of blood at the hub of the 8 French FlowGate guide catheter, control arteriogram performed through the FlowGate guide catheter in the left internal carotid artery demonstrated brisk antegrade flow to the cranial   skull base and into the left middle cerebral artery and left anterior cerebral artery distributions to the distal distributions. No angiographic filling defects or occlusions were seen. There was no evidence of spasm either. No mass-effect or midline shift was noted of the major vessels intracranially. The patient's hemodynamic and neurologic status remained stable. A TICI 3 reperfusion had been obtained. The 8 French FlowGate guide catheter and the 8 French 80 cm Arrow neurovascular sheath were then retrieved into the abdominal aorta and exchanged over a J-tiip guidewire for a 9 French Pinnacle sheath. This in turn was then connected to continuous heparinized saline infusion. The patient's distal pulses remained unchanged Dopplerable early compared to prior to the procedure. The right groin appeared soft as did the left groin at the site of the applied sheath. The patient was transported to the CT scanner for postprocedural CT scan of brain. IMPRESSION: Status post endovascular complete revascularization of occluded left internal carotid artery terminus, the left middle cerebral artery and left  anterior cerebral artery with 1 pass with the Solitaire 4 mm x 40 mm retrieval device in combination with aspiration of the hub of the intermediary catheter and proximal flow arrest, achieving a TICI 3 reperfusion as described above. Electronically Signed   By: Sanjeev  Deveshwar M.D.   On: 04/10/2016 13:15    , , DO  [facility] Pager [phone]  If 7PM-7AM, please contact night-coverage [email or web address] Password [email or web address] 04/24/2016, 4:56 PM   LOS: 9 days   

## 2016-04-24 NOTE — Progress Notes (Signed)
Physical Therapy Treatment Patient Details Name: Catherine ClossRegina Day MRN: [id number] DOB: 05/18/1927 Today's Date: 04/24/2016    History of Present Illness 82 year old female with PMH COPD, HTN, [facility] with sudden onset R sided-weakness and aphasia. MRI brain-- scattered acute infarcts in the left hemisphere primarily in the left temporal and occipital lobes, and Lt cerebellar artery territory. tPa was administered, taken to IR for revascularization of occluded Lt MCA, ACA and ICA terminus. She was electively intubated for the procedure; extubated 11/24.  Pt went to rehab and returned the same day with SOB and wheezing. Pt with aspiration pneuomitis    PT Comments    Pt progressing well.  Pt more alert today. Daughter reports she woke up last night around 5:30 pm and appeared to be alert.  Pt will continue to require CIR at d/c to maximize function and increase strength before returning home.    Follow Up Recommendations  CIR     Equipment Recommendations   (TBA in post acute rehab setting.  )    Recommendations for Other Services Rehab consult     Precautions / Restrictions Precautions Precautions: Fall Precaution Comments: on 2L 02 at baseline, required 3L during session and at rest.   Restrictions Weight Bearing Restrictions: No    Mobility  Bed Mobility Overal bed mobility: Needs Assistance Bed Mobility: Supine to Sit     Supine to sit: Supervision;HOB elevated     General bed mobility comments: Cues for technique and hand placement.    Transfers Overall transfer level: Needs assistance Equipment used: Rolling walker (2 wheeled) (slight difficulty with R hand placement on RW.  ) Transfers: Sit to/from Stand Sit to Stand: Min assist Stand pivot transfers: Min assist       General transfer comment: Cues for hand placement to and from seated surface.  Pt reaching to pull on RW.  Performed sit to stand x2.    Ambulation/Gait Ambulation/Gait  assistance: Min assist Ambulation Distance (Feet): 60 Feet Assistive device: Rolling walker (2 wheeled) Gait Pattern/deviations: Step-through pattern;Trunk flexed;Shuffle;Decreased stride length   Gait velocity interpretation: Below normal speed for age/gender General Gait Details: generally steady, cues for posture.  watery breath sound appeared labored, O2 sats 89%-93% on 3L.  Pt required cues for increasing stride length and assist with R hand to maintain grasp on RW.     Stairs            Wheelchair Mobility    Modified Rankin (Stroke Patients Only) Modified Rankin (Stroke Patients Only) Pre-Morbid Rankin Score: No symptoms Modified Rankin: Moderately severe disability     Balance Overall balance assessment: Needs assistance   Sitting balance-Leahy Scale: Fair       Standing balance-Leahy Scale: Poor                      Cognition Arousal/Alertness: Awake/alert Behavior During Therapy: WFL for tasks assessed/performed;Impulsive Overall Cognitive Status: Impaired/Different from baseline Area of Impairment: Orientation;Memory;Safety/judgement;Awareness Orientation Level: Time   Memory: Decreased short-term memory   Safety/Judgement: Decreased awareness of safety;Decreased awareness of deficits   Problem Solving: Slow processing;Requires verbal cues General Comments: family in room and reports she is not near her baseline.    Exercises      General Comments        Pertinent Vitals/Pain Pain Assessment: No/denies pain    Home Living  Prior Function            PT Goals (current goals can now be found in the care plan section) Acute Rehab PT Goals Patient Stated Goal: return home to [place] Potential to Achieve Goals: Good Progress towards PT goals: Progressing toward goals    Frequency    Min 4X/week      PT Plan Current plan remains appropriate    Co-evaluation             End of Session  Equipment Utilized During Treatment: Gait belt;Oxygen Activity Tolerance: Patient tolerated treatment well Patient left: in chair;with call bell/phone within reach;with chair alarm set;with family/visitor present     Time: [id number] PT Time Calculation (min) (ACUTE ONLY): 28 min  Charges:  $Gait Training: 8-22 mins $Therapeutic Activity: 8-22 mins                    G Codes:      Florestine Aversimee J Kadra Kohan 04/24/2016, 2:13 PM  Joycelyn RuaAimee Jarrod Bodkins, PTA pager [phone]

## 2016-04-24 NOTE — Progress Notes (Signed)
Dr. Craige CottaKirby paged to respiratory phone to discuss patient's respiratory status with no improvement post breathing treatments.

## 2016-04-24 NOTE — Progress Notes (Signed)
Rehab admissions - I spoke with daughter at the bedside today.  Daughter continues to want inpatient rehab.  I had rehab MD review patient notes today.  Patient is not ready for inpatient rehab today or this weekend.  I will check back on Monday if patient remains in the hospital over the weekend.  Call me for questions.  #[phone]

## 2016-04-24 NOTE — Progress Notes (Signed)
Pt was evaluated by this RT. Pt was noted to have expiratory wheezes and coarse crackles indicating fluid overload. Breathing treatment was given with no improvement. RT will continue to monitor.

## 2016-04-24 NOTE — Progress Notes (Signed)
Dr. Craige CottaKirby paged to notify patient's BP 135/68, HR 96, as requested. Awaiting order or response.

## 2016-04-24 NOTE — Progress Notes (Signed)
Nutrition Brief Note  Patient identified on the [facility] Report  Wt Readings from Last 15 Encounters:  04/24/16 162 lb 11.2 oz (73.8 kg)  04/14/16 201 lb (91.2 kg)  04/13/16 170 lb 10.2 oz (77.4 kg)   82 yo right-handed female from [place] w/ a Hxof COPD (on 2 L O2) and HLD who presented on April 10, 2016 with thesudden onset of right-sided weakness and an inability to speak. CT and MRI showed small volume scattered acute infarctsin theleft hemisphere primarily in the left temporal and occipital lobes. No associated mass effect. Small number of other scattered small bilateral MCA left cerebellar artery territory infarcts. The patient did receive tPA. MRI showed possible occlusion of the left PCA-P3 inferior division. Underwent bilateral common carotid arteriograms followed by complete revascularization of occluded left MCA, left ACA, left ICA terminus. Neurology consulted, maintained on aspirin therapy for CVA prophylaxis. TEE noted no intracardiac source of embolism or shunt. She was transferred to CIR, but while there she suffered worsening hypoxia and on 11/29 required transfer back to the acute hospital for aspiration pneumonitis.   Body mass index is 29.76 kg/m. Patient meets criteria for overweight based on current BMI.   Current diet order is Dysphagia 3 with nectar thick liquids, patient is consuming approximately 25-100% of meals at this time. Labs and medications reviewed.   No nutrition interventions warranted at this time. If nutrition issues arise, please consult RD.   Catherine Pagnotta A. Mayford KnifeWilliams, RD, LDN, CDE Pager: [phone] After hours Pager: [phone]

## 2016-04-25 LAB — BASIC METABOLIC PANEL
ANION GAP: 11 (ref 5–15)
BUN: 28 mg/dL — ABNORMAL HIGH (ref 6–20)
CALCIUM: 8.4 mg/dL — AB (ref 8.9–10.3)
CO2: 33 mmol/L — ABNORMAL HIGH (ref 22–32)
Chloride: 94 mmol/L — ABNORMAL LOW (ref 101–111)
Creatinine, Ser: 0.71 mg/dL (ref 0.44–1.00)
Glucose, Bld: 108 mg/dL — ABNORMAL HIGH (ref 65–99)
Potassium: 3.6 mmol/L (ref 3.5–5.1)
Sodium: 138 mmol/L (ref 135–145)

## 2016-04-25 MED ORDER — PREDNISONE 50 MG PO TABS
50.0000 mg | ORAL_TABLET | Freq: Every day | ORAL | Status: DC
  Administered 2016-04-25 – 2016-04-26 (×2): 50 mg via ORAL
  Filled 2016-04-25 (×2): qty 1

## 2016-04-25 MED ORDER — FUROSEMIDE 10 MG/ML IJ SOLN
40.0000 mg | Freq: Every day | INTRAMUSCULAR | Status: DC
  Filled 2016-04-25: qty 4

## 2016-04-25 MED ORDER — FUROSEMIDE 10 MG/ML IJ SOLN
60.0000 mg | Freq: Every day | INTRAMUSCULAR | Status: AC
  Administered 2016-04-25 – 2016-04-27 (×3): 60 mg via INTRAVENOUS
  Filled 2016-04-25 (×3): qty 6

## 2016-04-25 NOTE — Progress Notes (Signed)
Notified Dr. Eilene GhaziKakarandy that pt has foley cath in place. Bladder scanner showed no residual in bladder.  Pt continues to get OOB states she has to pee after being redirected that she has foley and she can pee by staff multiple times. Pt is very agitated. MD stated to give haldol 1mg  IV as ordered and place pt on telemetry monitoring. Will continue to monitor pt. Nelda MarseilleJenny Thacker, RN

## 2016-04-25 NOTE — Progress Notes (Signed)
PROGRESS NOTE  Unique Sillas RWE:[id number] DOB: 1928-01-10 DOA: 04/15/2016 PCP: No primary care provider on file.   Brief Narrative: 82 yo right-handed female from [place] w/ a Hxof COPD (on 2 L O2) and HLD who presented on April 10, 2016 with thesudden onset of right-sided weakness and an inability to speak. CT and MRI showed small volume scattered acute infarctsin theleft hemisphere primarily in the left temporal and occipital lobes. No associated mass effect. Small number of other scattered small bilateral MCA left cerebellar artery territory infarcts. The patient did receive tPA. MRI showed possible occlusion of the left PCA-P3 inferior division. Underwent bilateral common carotid arteriograms followed by complete revascularization of occluded left MCA, left ACA, left ICA terminus. Neurology consulted, maintained on aspirin therapy for CVA prophylaxis. TEE noted no intracardiac source of embolism or shunt. She was transferred to CIR, but while there she suffered worsening hypoxia and on 11/29 required transfer back to the acute hospital for aspiration pneumonitis.   Assessment & Plan: Acute on chronic respiratory failure with hypoxia -multifactorial including aspiration pneumonia, CHF in the setting of underlying COPD with exacerbation -stable on 3 L -continue  pulmicort  -flutter valve -continue duonebs q 8hours -normally on 2 L [place] at home  Acutetoxic Encephalopathy -04/23/16--pt fell asleep when not engaged directly; lethargic -received Ativan 1 mg IV at 0430 on 04/23/16 for agitation -minimize benzo; haldol 1 mg IV q 6 hrs prn agitation -04/23/16-personally reviwed CXR--R- basilar opacity, small left effusion--pt already on merrem -04/24/16--much more alert, pleasantly confused--back to baseline per daughter -04/23/16 urine culture neg  Acute on Chronic Systolic and diastolicCHF - pt has some evidence of fluid overload -Continue IV lasix--increase to 60 mg  daily -NEG 6.2L -04/13/16 TEE--EF 45-50%, grade 1 DD, normal RV -appears previous dry weight ~170 -held ARB due to soft BP--now improving  COPD Exacerbation -start prednisone 50 mg daily -continue DuoNeb q 8 -continue pulmicort  Aspiration pneumonia / pneumonitis - completed7days antibiotics - cleared for D3 diet w/ nectar liquids by SLP   PositiveESBL Escherichia coliUTI - Finished meropenem D#7of 7 on 12/8  Recent CVA - s/p IV TPA and IR treatments with R hemiparesthesia and dysphagia - hopefully can go back to CIR shortly - PT to evaluate, will likely need reconsult for inpatient rehabilitation if she still will meet criteria  Hypokalemia - Replaced  Lower extremity pain and edema -venous duplex--NEG  Goals of Care -12/7-long discussion with daughter at bedside-->change to DNR -plan for SNF vs CIR   DVT prophylaxis:heparin [place] Code Status:Full code Family Communication:Son and daughter updatedat bedside 12/9--Total time spent 35 minutes. Greater than 50% spent face to face counseling and coordinating care.  Disposition Plan:CIR vs SNF  Consultants:  PCCM  Procedures:  None   Antimicrobials: Zosyn 11/29 >12/2 Vancomycin 11/29 >12/2 Meropenem 12/2 >  Subjective: Patient denies fevers, chills, headache, chest pain, dyspnea, nausea, vomiting, diarrhea, abdominal pain, dysuria, hematuria, hematochezia, and melena. Still has dyspnea on exertion  Objective: Vitals:   04/25/16 0528 04/25/16 0756 04/25/16 1451 04/25/16 1520  BP: (!) 138/59   (!) 143/65  Pulse: 81   98  Resp: 18   20  Temp: 98 F (36.7 C)   98.7 F (37.1 C)  TempSrc: Oral   Oral  SpO2: 97% 99% 92% 92%  Weight: 74 kg (163 lb 2.3 oz)     Height:        Intake/Output Summary (Last 24 hours) at 04/25/16  [place]ers filed at 04/25/16 1055  Gross per 24 hour  Intake              460 ml  Output              750 ml  Net             -290 ml   Weight change:  0.2 kg (7.1 oz) Exam:   General:  Pt is alert, follows commands appropriately, not in acute distress  HEENT: No icterus, No thrush, No neck mass, [place]/AT  Cardiovascular: RRR, S1/S2, no rubs, no gallops; JVD  Respiratory: bilateral expiratory wheeze at bases.  Bibasilar crackles  Abdomen: Soft/+BS, non tender, non distended, no guarding  Extremities:  1+ LE edema, No lymphangitis, No petechiae, No rashes, no synovitis   Data Reviewed: I have personally reviewed following labs and imaging studies Basic Metabolic Panel:  Recent Labs Lab 04/19/16 0204  04/20/16 0200 04/21/16 0523 04/22/16 0617 04/23/16 0442 04/24/16 0554 04/25/16 0549  NA  --   < > 137 137 139 139 137 138  K  --   < > 4.4 4.2 3.5 3.7 3.6 3.6  CL  --   < > 101 97* 93* 96* 92* 94*  CO2  --   < > 29 32 37* 29 34* 33*  GLUCOSE  --   < > 141* 104* 95 104* 109* 108*  BUN  --   < > 11 21* 27* 27* 24* 28*  CREATININE  --   < > 0.80 0.71 0.90 0.88 0.86 0.71  CALCIUM  --   < > 7.8* 8.7* 8.7* 8.5* 8.4* 8.4*  MG 1.8  --  1.9  --   --  2.0  --   --   < > = values in this interval not displayed. Liver Function Tests:  Recent Labs Lab 04/21/16 0523  AST 29  ALT 24  ALKPHOS 50  BILITOT 0.6  PROT 6.0*  ALBUMIN 2.6*   No results for input(s): LIPASE, AMYLASE in the last 168 hours. No results for input(s): AMMONIA in the last 168 hours. Coagulation Profile: No results for input(s): INR, PROTIME in the last 168 hours. CBC:  Recent Labs Lab 04/21/16 0523 04/22/16 0617 04/23/16 0442 04/24/16 0554  WBC 12.8* 15.4* 11.8* 10.7*  HGB 10.8* 11.8* 11.2* 11.8*  HCT 35.5* 38.7 35.9* 37.4  MCV 87.7 86.6 87.1 87.2  PLT 366 384 315 265   Cardiac Enzymes: No results for input(s): CKTOTAL, CKMB, CKMBINDEX, TROPONINI in the last 168 hours. BNP: Invalid input(s): POCBNP CBG: No results for input(s): GLUCAP in the last 168 hours. HbA1C: No results for input(s): HGBA1C in the last 72 hours. Urine analysis:      Component Value Date/Time   COLORURINE COLORLESS (A) 04/23/2016 1248   APPEARANCEUR CLEAR 04/23/2016 1248   LABSPEC 1.005 04/23/2016 1248   PHURINE 7.0 04/23/2016 1248   GLUCOSEU NEGATIVE 04/23/2016 1248   HGBUR SMALL (A) 04/23/2016 1248   BILIRUBINUR NEGATIVE 04/23/2016 1248   KETONESUR NEGATIVE 04/23/2016 1248   PROTEINUR NEGATIVE 04/23/2016 1248   NITRITE NEGATIVE 04/23/2016 1248   LEUKOCYTESUR LARGE (A) 04/23/2016 1248   Sepsis Labs: _0 (procalcitonin:4,lacticidven:4) ) Recent Results (from the past 240 hour(s))  Urine culture     Status: None   Collection Time: 04/16/16  4:42 PM  Result Value Ref Range Status   Specimen Description URINE, CLEAN CATCH  Final   Special Requests PATIENT ON FOLLOWING ZOSYN  Final  Culture NO GROWTH  Final   Report Status 04/17/2016 FINAL  Final  Urine culture     Status: None   Collection Time: 04/23/16 12:48 PM  Result Value Ref Range Status   Specimen Description URINE, RANDOM  Final   Special Requests NONE  Final   Culture NO GROWTH  Final   Report Status 04/24/2016 FINAL  Final     Scheduled Meds: . aspirin  300 mg Rectal Daily  . budesonide (PULMICORT) nebulizer solution  0.5 mg Nebulization BID  . famotidine  20 mg Oral Daily  . furosemide  60 mg Intravenous Daily  . heparin  5,000 Units Subcutaneous Q8H  . ipratropium-albuterol  3 mL Nebulization TID  . mouth rinse  15 mL Mouth Rinse BID  . predniSONE  50 mg Oral Q breakfast   Continuous Infusions:  Procedures/Studies: Ct Head Wo Contrast  Result Date: 04/09/2016 CLINICAL DATA:  82 y/o F; code stroke post transarterial intervention. EXAM: CT HEAD WITHOUT CONTRAST TECHNIQUE: Contiguous axial images were obtained from the base of the skull through the vertex without intravenous contrast. COMPARISON:  04/09/2016 CT head. FINDINGS: Brain: No large territory acute infarct or intracranial hemorrhage identified. Patchy foci of hypoattenuation in periventricular and  subcortical white matter may represent areas of infarction or chronic microvascular ischemic changes and are stable. Mild parenchymal volume loss. No focal mass effect. No extra-axial collection. No effacement of basilar cisterns. Vascular: Persistent opacification from previous contrast administration. Calcific atherosclerosis of carotid siphons. Skull: Normal. Negative for fracture or focal lesion. Sinuses/Orbits: Partial opacification of ethmoid and sphenoid sinuses with aerosolized secretions and partial opacification of mastoid air cells probably related to intubation. Orbits are unremarkable. Other: None. IMPRESSION: No large territory acute infarct or intracranial hemorrhage identified. Patchy foci of hypoattenuation in periventricular and subcortical white matter may represent areas of infarction or chronic microvascular ischemic changes and are stable. If clinically indicated MRI is more sensitive for acute ischemia. Electronically Signed   By: Kristine Garbe M.D.   On: 04/09/2016 22:51   Mr Virgel Paling WC Contrast  Result Date: 04/10/2016 CLINICAL DATA:  81 year old female status post neuro intervention on 04/09/2016 for acute onset right side weakness and inability to speak. Status post Bilateral cerebral angiogram with Revascularization of occluded LT MCA ,Lt ACA and Lt ICA terminus with x1 PASS WITH A COMBINATION OF solitaire 8m x 40 mm retrieval device with simultaneous aspiration at the intermediate guide catheter and flow gate guide catheter, and 3.6 mg of superselective intracranial INTEGRELIN. Initial encounter. EXAM: MRI HEAD WITHOUT CONTRAST MRA HEAD WITHOUT CONTRAST TECHNIQUE: Multiplanar, multiecho pulse sequences of the brain and surrounding structures were obtained without intravenous contrast. Angiographic images of the head were obtained using MRA technique without contrast. COMPARISON:  Post tPA head CT 04/09/2016 FINDINGS: MRI HEAD FINDINGS Brain: Multiple small cortically  based and white-matter foci of restricted diffusion in the left temporal lobe, best seen on axial diffusion weighted imaging (series 4, image 20). Evidence of petechial hemorrhage in the left mesial temporal lobe which appears to track to the left cauda thalamic groove where diffusion is also heterogeneous. These areas demonstrate mild T2 and FLAIR hyperintensity without mass effect. In addition there are several other widely scattered mostly cortically based foci of restricted diffusion in both MCA territories at over the superior convexities. Similar scattered small infarcts in the left occipital lobe. One or 2 such areas in the left cerebellum. No other intracranial hemorrhage. No intracranial mass effect or ventriculomegaly. Superimposed patchy bilateral  cerebral white matter T2 and FLAIR hyperintensity. No chronic cortical encephalomalacia. Right deep gray matter nuclei and brainstem are normal for age. Negative pituitary and cervicomedullary junction. Vascular: Major intracranial vascular flow voids are preserved. Skull and upper cervical spine: Negative. Normal bone marrow signal. Sinuses/Orbits: Normal orbits soft tissues. Trace paranasal sinus mucosal thickening. Mild mastoid effusions. Other: Negative scalp soft tissues. MRA HEAD FINDINGS Mildly motion degraded. Antegrade flow in the posterior circulation with mildly dominant distal right vertebral artery. Normal PICA origins. Patent vertebrobasilar junction. Patent basilar artery without stenosis. Patent SCA and normal right PCA origins. Fetal type left PCA origin. Right posterior communicating artery diminutive or absent. Absent flow signal in the inferior left PCA P3 division. Otherwise the bilateral PCA branches appear normal. Antegrade flow in both ICA siphons with some generalized ICA dolichoectasia and tortuosity of the distal cervical right ICA. Patent carotid termini. Normal MCA and ACA origins. Generalized ACA dolichoectasia. Proximal ACA  branches appear normal. Both MCA bifurcations appear patent. MCA branch detail is degraded by motion. Fairly symmetric appearance of bilateral MCA branch flow signal. IMPRESSION: 1. Small volume of scattered acute infarcts in the left hemisphere primarily in the left temporal and occipital lobes (note fetal type left PCA origin anatomy). 2. Petechial hemorrhage in the mesial left temporal lobe tracking to the cauda thalamic groove. No associated mass effect and no malignant hemorrhagic transformation. 3. A small number of other scattered small infarcts in the bilateral MCA and left cerebellar artery territories might be related to endovascular intervention. 4. Motion degraded intracranial MRA negative for emergent large vessel occlusion or proximal intracranial stenosis. Possible occlusion of the left PCA P3 inferior division. Electronically Signed   By: Genevie Ann M.D.   On: 04/10/2016 19:16   Mr Brain Wo Contrast  Result Date: 04/10/2016 CLINICAL DATA:  82 year old female status post neuro intervention on 04/09/2016 for acute onset right side weakness and inability to speak. Status post Bilateral cerebral angiogram with Revascularization of occluded LT MCA ,Lt ACA and Lt ICA terminus with x1 PASS WITH A COMBINATION OF solitaire 49m x 40 mm retrieval device with simultaneous aspiration at the intermediate guide catheter and flow gate guide catheter, and 3.6 mg of superselective intracranial INTEGRELIN. Initial encounter. EXAM: MRI HEAD WITHOUT CONTRAST MRA HEAD WITHOUT CONTRAST TECHNIQUE: Multiplanar, multiecho pulse sequences of the brain and surrounding structures were obtained without intravenous contrast. Angiographic images of the head were obtained using MRA technique without contrast. COMPARISON:  Post tPA head CT 04/09/2016 FINDINGS: MRI HEAD FINDINGS Brain: Multiple small cortically based and white-matter foci of restricted diffusion in the left temporal lobe, best seen on axial diffusion weighted  imaging (series 4, image 20). Evidence of petechial hemorrhage in the left mesial temporal lobe which appears to track to the left cauda thalamic groove where diffusion is also heterogeneous. These areas demonstrate mild T2 and FLAIR hyperintensity without mass effect. In addition there are several other widely scattered mostly cortically based foci of restricted diffusion in both MCA territories at over the superior convexities. Similar scattered small infarcts in the left occipital lobe. One or 2 such areas in the left cerebellum. No other intracranial hemorrhage. No intracranial mass effect or ventriculomegaly. Superimposed patchy bilateral cerebral white matter T2 and FLAIR hyperintensity. No chronic cortical encephalomalacia. Right deep gray matter nuclei and brainstem are normal for age. Negative pituitary and cervicomedullary junction. Vascular: Major intracranial vascular flow voids are preserved. Skull and upper cervical spine: Negative. Normal bone marrow signal. Sinuses/Orbits: Normal orbits soft tissues.  Trace paranasal sinus mucosal thickening. Mild mastoid effusions. Other: Negative scalp soft tissues. MRA HEAD FINDINGS Mildly motion degraded. Antegrade flow in the posterior circulation with mildly dominant distal right vertebral artery. Normal PICA origins. Patent vertebrobasilar junction. Patent basilar artery without stenosis. Patent SCA and normal right PCA origins. Fetal type left PCA origin. Right posterior communicating artery diminutive or absent. Absent flow signal in the inferior left PCA P3 division. Otherwise the bilateral PCA branches appear normal. Antegrade flow in both ICA siphons with some generalized ICA dolichoectasia and tortuosity of the distal cervical right ICA. Patent carotid termini. Normal MCA and ACA origins. Generalized ACA dolichoectasia. Proximal ACA branches appear normal. Both MCA bifurcations appear patent. MCA branch detail is degraded by motion. Fairly symmetric  appearance of bilateral MCA branch flow signal. IMPRESSION: 1. Small volume of scattered acute infarcts in the left hemisphere primarily in the left temporal and occipital lobes (note fetal type left PCA origin anatomy). 2. Petechial hemorrhage in the mesial left temporal lobe tracking to the cauda thalamic groove. No associated mass effect and no malignant hemorrhagic transformation. 3. A small number of other scattered small infarcts in the bilateral MCA and left cerebellar artery territories might be related to endovascular intervention. 4. Motion degraded intracranial MRA negative for emergent large vessel occlusion or proximal intracranial stenosis. Possible occlusion of the left PCA P3 inferior division. Electronically Signed   By: Genevie Ann M.D.   On: 04/10/2016 19:16   Dg Chest Port 1 View  Result Date: 04/23/2016 CLINICAL DATA:  Aspiration pneumonia EXAM: PORTABLE CHEST 1 VIEW COMPARISON:  04/23/2016 at 12:20 FINDINGS: Mild streaky opacity in the right days. Left lung is clear. Pulmonary vasculature is normal. No effusions. Hilar, mediastinal and cardiac contours are unremarkable and unchanged. IMPRESSION: Streaky right base opacity. No focal confluent consolidation. No significant change in the interim. Electronically Signed   By: Andreas Newport M.D.   On: 04/23/2016 23:41   Dg Chest Port 1 View  Result Date: 04/23/2016 CLINICAL DATA:  COPD, aspiration pneumonia. EXAM: PORTABLE CHEST 1 VIEW COMPARISON:  Portable chest x-ray of Sep 20, 2015 FINDINGS: The lungs are mildly hyperinflated. The interstitial markings are coarse. Increased density at the right lung base is consistent with the clinically suspected aspiration pneumonia. The heart and pulmonary vascularity are normal. There is calcification in the wall of the tortuous thoracic aorta. There is no pleural effusion. The bony thorax exhibits no acute abnormality. IMPRESSION: Right basilar pneumonia consistent with aspiration. COPD. Thoracic  aortic atherosclerosis. When the patient can tolerate the procedure, a PA and lateral chest x-ray would be useful. Electronically Signed   By: Selso Mannor  [place] M.D.   On: 04/23/2016 12:34   Dg Chest Port 1 View  Result Date: 04/21/2016 CLINICAL DATA:  Initial evaluation for acute respiratory distress, wheezing. History of COPD. EXAM: PORTABLE CHEST 1 VIEW COMPARISON:  Prior radiograph from 04/21/2016. FINDINGS: Cardiac and mediastinal silhouettes are stable in size and contour, and remain within normal limits. Atherosclerotic disease noted within the aortic arch. Lungs are hypoinflated. Changes related COPD noted. Increased right basilar opacity favored to reflect atelectasis/bronchovascular crowding, although recurrent infiltrate not excluded. Left basilar atelectasis noted. Small left pleural effusion. No pulmonary edema or pneumothorax. No acute osseous abnormality. IMPRESSION: 1. Shallow lung inflation with increased right basilar opacity, favored to reflect atelectasis/ bronchovascular crowding, although recurrent infiltrate could be considered in the correct clinical setting. 2. Small left pleural effusion with associated atelectasis. 3. COPD. Electronically Signed   By: Marland Kitchen  Jeannine Boga M.D.   On: 04/21/2016 20:38   Dg Chest Port 1 View  Result Date: 04/21/2016 CLINICAL DATA:  Follow-up wheezing, COPD. EXAM: PORTABLE CHEST 1 VIEW COMPARISON:  04/20/2016 FINDINGS: There is hyperinflation of the lungs compatible with COPD. Heart and mediastinal contours are within normal limits. No focal opacities or effusions. No acute bony abnormality. IMPRESSION: COPD.  No active disease. Electronically Signed   By: Rolm Baptise M.D.   On: 04/21/2016 08:27   Dg Chest Port 1 View  Result Date: 04/20/2016 CLINICAL DATA:  Pneumonia. EXAM: PORTABLE CHEST 1 VIEW COMPARISON:  04/16/2016. FINDINGS: Mediastinum and hilar structures are normal. Heart size stable. Low lung volumes with basilar atelectasis. Slight  improvement of right base infiltrate. A component of right base bronchiectasis cannot be excluded. Tiny left pleural effusion. No pneumothorax. IMPRESSION: 1. Interim partial clearing of right base infiltrate. A component of bronchiectasis in the right base cannot be excluded. 2. Low lung volumes.  Small left pleural effusion noted. Electronically Signed   By: Marcello Moores  Register   On: 04/20/2016 07:20   Dg Chest Port 1 View  Result Date: 04/16/2016 CLINICAL DATA:  Acute onset of shortness of breath. Initial encounter. EXAM: PORTABLE CHEST 1 VIEW COMPARISON:  Chest radiograph performed 04/14/2016 FINDINGS: Persistent right basilar airspace opacity remains concerning for pneumonia. No definite pleural effusion or pneumothorax is seen. Mild vascular congestion is noted. The cardiomediastinal silhouette is mildly enlarged. No acute osseous abnormalities are identified. IMPRESSION: 1. Persistent right basilar airspace opacity remains concerning for pneumonia. 2. Mild vascular congestion and mild cardiomegaly. Electronically Signed   By: Garald Balding M.D.   On: 04/16/2016 06:26   Dg Chest Port 1 View  Result Date: 04/14/2016 CLINICAL DATA:  Acute onset of shortness of breath and cough. Initial encounter. EXAM: PORTABLE CHEST 1 VIEW COMPARISON:  Chest radiograph performed 04/11/2016 FINDINGS: Right basilar airspace opacity raises concern for pneumonia. No pleural effusion or pneumothorax is seen. The cardiomediastinal silhouette is borderline normal in size. No acute osseous abnormalities are identified. IMPRESSION: Right basilar pneumonia noted. Electronically Signed   By: Garald Balding M.D.   On: 04/14/2016 21:50   Dg Chest Port 1 View  Result Date: 04/11/2016 CLINICAL DATA:  82 year old female with respiratory failure EXAM: PORTABLE CHEST 1 VIEW COMPARISON:  Prior chest x-ray 04/09/2016 FINDINGS: The patient has been extubated and the nasogastric tube removed. Inspiratory volumes are lower. Nonspecific  patchy opacities in the right greater than left base. Stable cardiomegaly. Mild pulmonary vascular congestion without edema. Atherosclerotic, ectatic and tortuous thoracic aorta. Right IJ approach central venous catheter remains in unchanged position. The tip of the catheter is at the superior cavoatrial junction. Osseous structures are intact and unremarkable. IMPRESSION: 1. Interval extubation and removal of nasogastric tube. Right IJ central venous catheter remains in stable and satisfactory position. 2. Lower inspiratory volumes with increased bibasilar atelectasis and pulmonary vascular congestion. 3.  Aortic Atherosclerosis (ICD10-170.0) Electronically Signed   By: Jacqulynn Cadet M.D.   On: 04/11/2016 08:12   Dg Chest Port 1 View  Result Date: 04/10/2016 CLINICAL DATA:  Endotracheal tube and orogastric tube placement. Initial encounter. EXAM: PORTABLE CHEST 1 VIEW COMPARISON:  None. FINDINGS: The patient's endotracheal tube is seen ending 2 cm above the carina. A right IJ line is noted ending about the distal SVC. An enteric tube is noted extending below the diaphragm. Mild bibasilar atelectasis is noted. No pleural effusion or pneumothorax is seen. The cardiomediastinal silhouette is borderline normal in size. No acute  osseous abnormalities are identified. IMPRESSION: 1. Endotracheal tube seen ending 2 cm above the carina. 2. Right IJ line noted ending about the distal SVC. 3. Enteric tube noted extending below the diaphragm. 4. Mild bibasilar atelectasis. Electronically Signed   By: Garald Balding M.D.   On: 04/10/2016 00:02   Dg Abd Portable 1v  Result Date: 04/09/2016 CLINICAL DATA:  82 y/o  F; OG tube placement. EXAM: PORTABLE ABDOMEN - 1 VIEW COMPARISON:  None. FINDINGS: Patient is rotated. Normal bowel gas pattern. Enteric tube tip is below the diaphragm probably in the distal stomach. Slight densities projecting over the kidneys bilaterally may represent stones. Degenerative changes of the  spine. IMPRESSION: Enteric tube tip probably in distal stomach. Densities projecting over kidneys may represent nephrolithiasis. Electronically Signed   By: Kristine Garbe M.D.   On: 04/09/2016 23:56   Dg Swallowing Func-speech Pathology  Result Date: 04/17/2016 Objective Swallowing Evaluation: Type of Study: MBS-Modified Barium Swallow Study Patient Details Name: Chinmayi Rumer MRN: [id number] Date of Birth: May 01, 1928 Today's Date: 04/17/2016 Time: SLP Start Time (ACUTE ONLY): 1045-SLP Stop Time (ACUTE ONLY): 1115 SLP Time Calculation (min) (ACUTE ONLY): 30 min Past Medical History: Past Medical History: Diagnosis Date . COPD (chronic obstructive pulmonary disease) ([place])  Past Surgical History: Past Surgical History: Procedure Laterality Date . IR GENERIC HISTORICAL  04/09/2016  IR ANGIO INTRA EXTRACRAN SEL COM CAROTID INNOMINATE UNI R MOD SED 04/09/2016 Luanne Bras, MD [facility]INTER[facility] RAD . IR GENERIC HISTORICAL  04/09/2016  IR PERCUTANEOUS ART THROMBECTOMY/INFUSION INTRACRANIAL INC DIAG ANGIO 04/09/2016 Luanne Bras, MD [facility] RAD . PELVIC FRACTURE SURGERY   . RADIOLOGY WITH ANESTHESIA N/A 04/09/2016  Procedure: RADIOLOGY WITH ANESTHESIA;  Surgeon: Luanne Bras, MD;  Location: [place];  Service: Radiology;  Laterality: N/A; . TEE WITHOUT CARDIOVERSION N/A 04/13/2016  Procedure: TRANSESOPHAGEAL ECHOCARDIOGRAM (TEE);  Surgeon: Sanda Klein, MD;  Location: [facility] ENDOSCOPY;  Service: Cardiovascular;  Laterality: N/A; HPI: 82 y.o. right handed female with h/o COPD. Presented 04/10/2016 with sudden onset of right-sided weakness and inability to speak. 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 Pt discharged to [facility] 11/28, however, later that night she became dyspneic and diaphoretic. She was given albuterol nebs with some improvement. CXR with RLL infiltrate.  Subjective: pt awake, pleasant Assessment / Plan / Recommendation CHL IP CLINICAL IMPRESSIONS 04/17/2016 Therapy Diagnosis Mild oral phase dysphagia;Mild pharyngeal phase dysphagia Clinical Impression Pt's swallow function demonstrates mild improvements since [facility] 11/26.  She presents with piecemeal oral bolusing; persisting delay in initiation (valleculae and pyriforms) -however, this is not necessarily considered disordered in the elderly population.  There may have been questionable, high penetration of liquids, but it was difficult to discern.  No aspiration. Pyriform sinuses appear to be asymmetric and there is mild residue remaining post-swallow. Recommend advancing diet to dysphagia 3, nectar-thick liquids for now.  Will follow for functional toleration/safety.   Impact on safety and function Mild aspiration risk   CHL IP TREATMENT RECOMMENDATION 04/17/2016 Treatment Recommendations Therapy as outlined in treatment plan below   Prognosis 04/17/2016 Prognosis for Safe Diet Advancement Good Barriers to Reach Goals -- Barriers/Prognosis Comment -- CHL IP DIET RECOMMENDATION 04/17/2016 SLP Diet Recommendations Dysphagia 3 (Mech soft) solids;Nectar thick liquid Liquid Administration via Cup Medication Administration Whole meds with puree Compensations Small sips/bites;Slow rate Postural Changes --   CHL IP OTHER RECOMMENDATIONS 04/17/2016 Recommended Consults -- Oral Care Recommendations  Oral care BID Other Recommendations --   CHL IP FOLLOW UP RECOMMENDATIONS 04/17/2016 Follow up Recommendations Inpatient Rehab   CHL IP FREQUENCY AND DURATION 04/17/2016 Speech Therapy Frequency (ACUTE ONLY) min 2x/week Treatment Duration 2 weeks      CHL IP ORAL PHASE 04/17/2016 Oral Phase Impaired Oral - Pudding Teaspoon -- Oral - Pudding Cup -- Oral - Honey Teaspoon -- Oral - Honey Cup -- Oral - Nectar Teaspoon -- Oral - Nectar Cup -- Oral - Nectar Straw -- Oral - Thin Teaspoon -- Oral - Thin Cup -- Oral - Thin Straw -- Oral - Puree  Piecemeal swallowing Oral - Mech Soft Piecemeal swallowing Oral - Regular -- Oral - Multi-Consistency -- Oral - Pill -- Oral Phase - Comment --  CHL IP PHARYNGEAL PHASE 04/17/2016 Pharyngeal Phase Impaired Pharyngeal- Pudding Teaspoon -- Pharyngeal -- Pharyngeal- Pudding Cup -- Pharyngeal -- Pharyngeal- Honey Teaspoon -- Pharyngeal -- Pharyngeal- Honey Cup NT Pharyngeal -- Pharyngeal- Nectar Teaspoon -- Pharyngeal -- Pharyngeal- Nectar Cup Pharyngeal residue - pyriform;Delayed swallow initiation-pyriform sinuses Pharyngeal Material enters airway, remains ABOVE vocal cords then ejected out Pharyngeal- Nectar Straw -- Pharyngeal -- Pharyngeal- Thin Teaspoon -- Pharyngeal -- Pharyngeal- Thin Cup Delayed swallow initiation-pyriform sinuses;Pharyngeal residue - pyriform Pharyngeal Material enters airway, remains ABOVE vocal cords then ejected out Pharyngeal- Thin Straw -- Pharyngeal -- Pharyngeal- Puree Delayed swallow initiation-vallecula;Pharyngeal residue - pyriform Pharyngeal -- Pharyngeal- Mechanical Soft Delayed swallow initiation-vallecula;Pharyngeal residue - pyriform Pharyngeal -- Pharyngeal- Regular -- Pharyngeal -- Pharyngeal- Multi-consistency -- Pharyngeal -- Pharyngeal- Pill -- Pharyngeal -- Pharyngeal Comment --  CHL IP CERVICAL ESOPHAGEAL PHASE 04/12/2016 Cervical Esophageal Phase WFL Pudding Teaspoon -- Pudding Cup -- Honey Teaspoon -- Honey Cup -- Nectar Teaspoon -- Nectar Cup -- Nectar Straw -- Thin Teaspoon -- Thin Cup -- Thin Straw -- Puree -- Mechanical Soft -- Regular -- Multi-consistency -- Pill -- Cervical Esophageal Comment -- No flowsheet data found. Juan Quam Laurice 04/17/2016, 11:50 AM              Dg Swallowing Func-speech Pathology  Result Date: 04/12/2016 Objective Swallowing Evaluation: Type of Study: MBS-Modified Barium Swallow Study Patient Details Name: Joud Pettinato MRN: [id number] Date of Birth: 1927/07/27 Today's Date: 04/12/2016 Time: SLP Start Time (ACUTE ONLY): 1116-SLP Stop  Time (ACUTE ONLY): 1131 SLP Time Calculation (min) (ACUTE ONLY): 15 min Past Medical History: Past Medical History: Diagnosis Date . COPD (chronic obstructive pulmonary disease) ([place])  Past Surgical History: Past Surgical History: Procedure Laterality Date . PELVIC FRACTURE SURGERY   . RADIOLOGY WITH ANESTHESIA N/A 04/09/2016  Procedure: RADIOLOGY WITH ANESTHESIA;  Surgeon: Luanne Bras, MD;  Location: [place];  Service: Radiology;  Laterality: N/A; HPI: Ms Seay is an 82 yo female with h/o COPD admitted with mental status change, right sided weakness, hemianopsia, left gaze preference,  Pt required intubation and was extubated yesterday.  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  Swallow and speech eval ordered Subjective: pt awake, pleasant Assessment / Plan / Recommendation CHL IP CLINICAL IMPRESSIONS 04/12/2016 Therapy Diagnosis Moderate pharyngeal phase dysphagia Clinical Impression Pt has a moderate pharyngeal dysphagia due in large part to delayed swallow initiation and decreased sensation of airway penetration. Liquids spill to the pyriform sinuses, resulting in deep, silent penetration of thin and nectar thick liquids. Although this does not occur with every bolus, it is silent and unable to be completely cleared with cued coughing, making her at a higher risk  for an aspiration-related infection. Her strength is adequate with no significant residuals remaining post-swallow. Recommend Dys 3 diet and honey thick liquids. SLP to f/u for tolerance and readiness to advance. Impact on safety and function Moderate aspiration risk   CHL IP TREATMENT RECOMMENDATION 04/12/2016 Treatment Recommendations Therapy as outlined in treatment plan below   Prognosis 04/12/2016 Prognosis for Safe Diet Advancement Good Barriers to Reach Goals -- Barriers/Prognosis Comment -- CHL IP DIET RECOMMENDATION 04/12/2016 SLP Diet  Recommendations Dysphagia 3 (Mech soft) solids;Honey thick liquids Liquid Administration via Cup;No straw Medication Administration Whole meds with puree Compensations Slow rate;Small sips/bites Postural Changes Remain semi-upright after after feeds/meals (Comment);Seated upright at 90 degrees   CHL IP OTHER RECOMMENDATIONS 04/12/2016 Recommended Consults -- Oral Care Recommendations Oral care BID Other Recommendations Order thickener from pharmacy;Prohibited food (jello, ice cream, thin soups);Remove water pitcher   CHL IP FOLLOW UP RECOMMENDATIONS 04/12/2016 Follow up Recommendations Inpatient Rehab   CHL IP FREQUENCY AND DURATION 04/12/2016 Speech Therapy Frequency (ACUTE ONLY) min 2x/week Treatment Duration 2 weeks      CHL IP ORAL PHASE 04/12/2016 Oral Phase WFL Oral - Pudding Teaspoon -- Oral - Pudding Cup -- Oral - Honey Teaspoon -- Oral - Honey Cup -- Oral - Nectar Teaspoon -- Oral - Nectar Cup -- Oral - Nectar Straw -- Oral - Thin Teaspoon -- Oral - Thin Cup -- Oral - Thin Straw -- Oral - Puree -- Oral - Mech Soft -- Oral - Regular -- Oral - Multi-Consistency -- Oral - Pill -- Oral Phase - Comment --  CHL IP PHARYNGEAL PHASE 04/12/2016 Pharyngeal Phase Impaired Pharyngeal- Pudding Teaspoon -- Pharyngeal -- Pharyngeal- Pudding Cup -- Pharyngeal -- Pharyngeal- Honey Teaspoon -- Pharyngeal -- Pharyngeal- Honey Cup Delayed swallow initiation-pyriform sinuses Pharyngeal -- Pharyngeal- Nectar Teaspoon -- Pharyngeal -- Pharyngeal- Nectar Cup Delayed swallow initiation-pyriform sinuses;Penetration/Aspiration before swallow Pharyngeal Material enters airway, remains ABOVE vocal cords and not ejected out Pharyngeal- Nectar Straw -- Pharyngeal -- Pharyngeal- Thin Teaspoon -- Pharyngeal -- Pharyngeal- Thin Cup Delayed swallow initiation-pyriform sinuses;Penetration/Aspiration before swallow Pharyngeal Material enters airway, CONTACTS cords and not ejected out Pharyngeal- Thin Straw -- Pharyngeal -- Pharyngeal- Puree  Delayed swallow initiation-vallecula Pharyngeal -- Pharyngeal- Mechanical Soft Delayed swallow initiation-vallecula Pharyngeal -- Pharyngeal- Regular -- Pharyngeal -- Pharyngeal- Multi-consistency -- Pharyngeal -- Pharyngeal- Pill -- Pharyngeal -- Pharyngeal Comment --  CHL IP CERVICAL ESOPHAGEAL PHASE 04/12/2016 Cervical Esophageal Phase WFL Pudding Teaspoon -- Pudding Cup -- Honey Teaspoon -- Honey Cup -- Nectar Teaspoon -- Nectar Cup -- Nectar Straw -- Thin Teaspoon -- Thin Cup -- Thin Straw -- Puree -- Mechanical Soft -- Regular -- Multi-consistency -- Pill -- Cervical Esophageal Comment -- No flowsheet data found. Germain Osgood 04/12/2016, 1:05 PM  Germain Osgood, M.A. CCC-SLP [phone]             Ir Percutaneous Art Thrombectomy/infusion Intracranial Inc Diag Angio  Result Date: 04/13/2016 CLINICAL DATA:  Right-sided hemiplegia, left gaze preference, confusion, global aphasia. EXAM: IR PERCUTANEOUS ART THORMBECTOMY/INFUSION INTRACRANIAL INCLUDE DIAG ANGIO; IR ANGIO INTRA EXTRACRAN SEL COM CAROTID INNOMINATE UNI RIGHT MOD SED PROCEDURE: Following a full explanation of the procedure along with the potential associated complications, an informed witnessed consent was obtained. Risks of intracranial hemorrhage of 10-15%, worsening neurological deficit, death, and the inability to revascularize were all reviewed in detail with the patient's daughter. Informed consent was obtained. The patient was then put under general anesthesia by the Department of Anesthesiology at [facility]. The right groin was prepped and draped  in the usual sterile fashion. Thereafter using modified Seldinger technique, transfemoral access into the right common femoral artery was obtained without difficulty. Over a 0.035 inch guidewire a 5 French Pinnacle sheath was inserted. Through this, and also over a 0.035 inch guidewire a 5 [place] JB 1 catheter was advanced to the aortic arch region and selectively positioned  in the left common carotid artery and later in the right common carotid artery. There were no acute complications. The patient tolerated the procedure well. Contrast: Isovue 300 approximately 70 mL. Anesthesia/Sedation:  General anesthesia. Medications: As per general anesthesia. FINDINGS: The right common carotid arteriogram demonstrates moderate to severe tortuosity of the left common carotid artery in its proximal portion with acute angular takeoff towards the aortic arch. Slow flow was seen ascending to the left internal carotid artery. There was no angiographic visualization of the left external carotid artery consistent with occlusion. The left internal carotid artery at the bulb demonstrated no occlusions with wide patency. 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 The right common carotid arteriogram demonstrates the right external carotid artery and its major branches to be widely patent. The right internal carotid at the bulb and just distally appears widely patent. Focal areas of focal outpouching are seen to emanate from the posterior wall of the right internal carotid artery in the proximal one-third. Distal to this there is a double U shaped tortuosity of the mid left internal carotid artery. Distal to this the left internal carotid artery is seen to opacify normally to the cranial skull base. There is mild fusiform prominence of the petrous cavernous junction extending into the proximal cavernous artery. The distal cavernous carotid and supraclinoid segments are widely patent. There is a mild focal bulge noted at the level of the right posterior communicating artery. The right middle cerebral artery and the right anterior cerebral artery opacify normally into the capillary and venous phases. ENDOVASCULAR COMPLETE REVASCULARIZATION OF OCCLUDED LEFT INTERNAL CAROTID ARTERY  TERMINUS, THE LEFT MIDDLE CEREBRAL ARTERY AND LEFT ANTERIOR CEREBRAL ARTERY. The diagnostic JB 1 catheter in the left common carotid artery was then exchanged over a 0.035 inch 300 cm Rosen exchange guidewire for a 8 French 55 cm Brite tip neurovascular sheath using biplane roadmap technique and constant fluoroscopic guidance. Good aspiration obtained from the side port of the neurovascular sheath. This was then connected to continuous heparinized saline infusion. Over the [facility] exchange guidewire the distal end of which was at the origin of the left external carotid artery an 8 French 85 cm FlowGate balloon guide catheter which had been prepped with 50% contrast and 50% heparinized saline infusion was then advanced to just the origin of left internal carotid artery. The guidewire was removed. Good aspiration was obtained from the hub of the 8 [place] FlowGate guide catheter. Gentle contrast injection demonstrated no evidence of dissections or of intraluminal filling defects. Over a 0.014 inch Softip Synchro micro guidewire, a 021 Trevo ProVue micro catheter was then advanced to just distal to the origin of the 85 cm 8 [place] FlowGate guide catheter. The micro catheter and micro guidewire were advanced without difficulty to the cranial skull base. However, further advancement of the micro catheter was met with herniation of the entire platform into the aortic arch. The entire system was then removed and replaced with a 8 French 80 cm Arrow neurovascular sheath which was positioned over  an exchange micro guidewire just proximal to the origin of the left internal carotid artery. Over the [employer] guidewire, the 8 [place] 80 cm Arrow neurovascular sheath, the 8 French 85 cm FlowGate guide catheter was then advanced and positioned just proximal to the left internal carotid artery origin. The guidewire was removed. Good aspiration obtained from the hub of the 8 [place] FlowGate guide catheter. Gentle contrast  injection demonstrated no evidence of spasms, dissections or of intraluminal filling defects. 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 Over a 0.014 inch Softip Synchro micro guidewire which had a J-tip configuration, the combination of an intermediary 6 French 132 cm Catalyst guide catheter inside of which was a Trevo ProVue micro catheter, the combination was advanced without difficulty to the supraclinoid left ICA. The micro guidewire was then gently advanced through the occluded left middle cerebral artery into the M2 M3 regions of the inferior division followed by the micro catheter and also followed by the 6 French 32 cm Catalyst guide catheter into the proximal left M1 segment. The micro guidewire was then removed. Aspiration was obtained from the hub of the micro catheter. Approximately 3.6 mg of arterial Integrilin were infused 1.8 mg through the micro catheter distally, and 1.8 mg through the 6 [place] Catalyst guide catheter. At this time, a 4 mm x 40 mm Solitaire FR retrieval device which had been prepped and purged with heparinized saline infusion was advanced to the distal end of the micro catheter in a coaxial manner and with constant heparinized saline infusion. The O rings on delivery micro catheter were then loosened. With slight forward gentle traction with the right hand on the delivery micro guidewire, with the left hand the delivery micro catheter was retrieved until it had been deployed. At this time, the 6 [place] Catalyst 132 cm guide catheter was advanced to just inside the proximal portion of the retrieval device. The occlusion balloon at the origin of the left internal carotid artery was then expanded. Also a 20 mL syringe was hooked to the hub of the 6 [place] Catalyst guide catheter and a constant aspiration was then applied. Thereon after, the combination of the retrieval device, with the  micro catheter and the 6 [place] guide catheter were then gently retrieved with constant fluoroscopic guidance and removed. Aspiration was continued with a 60 mL syringe at the hub of the 8 [place] FlowGate guide catheter which was then gently deflated. Free back bleed at the hub of the 8 [place] FlowGate guide catheter was achieved. The aspirate contained multiple focal chunks of grayish bloody thrombus. Similar thrombus was also noted within the 8 [place] Catalyst guide catheter. Clots were also noted in the interstices of the retrieval device. After having obtained free aspiration of blood at the hub of the 8 [place] FlowGate guide catheter, control arteriogram performed through the [facility] guide catheter in the left internal carotid artery demonstrated brisk antegrade flow to the cranial skull base and into the left middle cerebral artery and left anterior cerebral artery distributions to the distal distributions. No angiographic filling defects or occlusions were seen. There was no evidence of spasm either. No mass-effect or midline shift was noted of the major vessels intracranially. The patient's hemodynamic and neurologic status remained stable. A TICI 3 reperfusion had been obtained. The 8 [place] FlowGate guide catheter and the 8 French 80 cm Arrow neurovascular sheath were then retrieved into  the abdominal aorta and exchanged over a J-tiip guidewire for a 9 French Pinnacle sheath. This in turn was then connected to continuous heparinized saline infusion. The patient's distal pulses remained unchanged Dopplerable early compared to prior to the procedure. The right groin appeared soft as did the left groin at the site of the applied sheath. The patient was transported to the CT scanner for postprocedural CT scan of brain. IMPRESSION: Status post endovascular complete revascularization of occluded left internal carotid artery terminus, the left middle cerebral artery and left anterior cerebral artery with 1 pass  with the Solitaire 4 mm x 40 mm retrieval device in combination with aspiration of the hub of the intermediary catheter and proximal flow arrest, achieving a TICI 3 reperfusion as described above. Electronically Signed   By: Luanne Bras M.D.   On: 04/10/2016 13:15   Ct Head Code Stroke Wo Contrast`  Result Date: 04/09/2016 CLINICAL DATA:  Code stroke for right-sided weakness and loss of speech. EXAM: CT HEAD WITHOUT CONTRAST TECHNIQUE: Contiguous axial images were obtained from the base of the skull through the vertex without intravenous contrast. COMPARISON:  None. FINDINGS: Brain: No evidence of acute infarction, hemorrhage, hydrocephalus, extra-axial collection or mass lesion/mass effect. Vascular: Atherosclerotic calcification.  No hyperdense vessel. Skull: Negative Sinuses/Orbits: Mild chronic sinusitis and left mastoiditis. Other: Results were called by telephone at the time of interpretation on 04/09/2016 at 6:41 pm to Dr. Lawana Pai, who verbally acknowledged these results. ASPECTS [facility] Stroke Program Early CT Score) - Ganglionic level infarction (caudate, lentiform nuclei, internal capsule, insula, M1-M3 cortex): 7 - Supraganglionic infarction (M4-M6 cortex): 3 Total score (0-10 with 10 being normal): 10 IMPRESSION: No acute finding. ASPECTS is 10. Electronically Signed   By: Monte Fantasia M.D.   On: 04/09/2016 18:43   Ir Angio Intra Extracran Sel Com Carotid Innominate Uni R Mod Sed  Result Date: 04/13/2016 CLINICAL DATA:  Right-sided hemiplegia, left gaze preference, confusion, global aphasia. EXAM: IR PERCUTANEOUS ART THORMBECTOMY/INFUSION INTRACRANIAL INCLUDE DIAG ANGIO; IR ANGIO INTRA EXTRACRAN SEL COM CAROTID INNOMINATE UNI RIGHT MOD SED PROCEDURE: Following a full explanation of the procedure along with the potential associated complications, an informed witnessed consent was obtained. Risks of intracranial hemorrhage of 10-15%, worsening neurological deficit, death, and the  inability to revascularize were all reviewed in detail with the patient's daughter. Informed consent was obtained. The patient was then put under general anesthesia by the Department of Anesthesiology at [facility]. The right groin was prepped and draped in the usual sterile fashion. Thereafter using modified Seldinger technique, transfemoral access into the right common femoral artery was obtained without difficulty. Over a 0.035 inch guidewire a 5 French Pinnacle sheath was inserted. Through this, and also over a 0.035 inch guidewire a 5 [place] JB 1 catheter was advanced to the aortic arch region and selectively positioned in the left common carotid artery and later in the right common carotid artery. There were no acute complications. The patient tolerated the procedure well. Contrast: Isovue 300 approximately 70 mL. Anesthesia/Sedation:  General anesthesia. Medications: As per general anesthesia. FINDINGS: The right common carotid arteriogram demonstrates moderate to severe tortuosity of the left common carotid artery in its proximal portion with acute angular takeoff towards the aortic arch. Slow flow was seen ascending to the left internal carotid artery. There was no angiographic visualization of the left external carotid artery consistent with occlusion. The left internal carotid artery at the bulb demonstrated no occlusions with wide patency. 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 The right common carotid arteriogram demonstrates the right external carotid artery and its major branches to be widely patent. The right internal carotid at the bulb and just distally appears widely patent. Focal areas of focal outpouching are seen to emanate from the posterior wall of the right internal carotid artery in the proximal one-third. Distal to this there is a double U shaped  tortuosity of the mid left internal carotid artery. Distal to this the left internal carotid artery is seen to opacify normally to the cranial skull base. There is mild fusiform prominence of the petrous cavernous junction extending into the proximal cavernous artery. The distal cavernous carotid and supraclinoid segments are widely patent. There is a mild focal bulge noted at the level of the right posterior communicating artery. The right middle cerebral artery and the right anterior cerebral artery opacify normally into the capillary and venous phases. ENDOVASCULAR COMPLETE REVASCULARIZATION OF OCCLUDED LEFT INTERNAL CAROTID ARTERY TERMINUS, THE LEFT MIDDLE CEREBRAL ARTERY AND LEFT ANTERIOR CEREBRAL ARTERY. The diagnostic JB 1 catheter in the left common carotid artery was then exchanged over a 0.035 inch 300 cm Rosen exchange guidewire for a 8 French 55 cm Brite tip neurovascular sheath using biplane roadmap technique and constant fluoroscopic guidance. Good aspiration obtained from the side port of the neurovascular sheath. This was then connected to continuous heparinized saline infusion. Over the [facility] exchange guidewire the distal end of which was at the origin of the left external carotid artery an 8 French 85 cm FlowGate balloon guide catheter which had been prepped with 50% contrast and 50% heparinized saline infusion was then advanced to just the origin of left internal carotid artery. The guidewire was removed. Good aspiration was obtained from the hub of the 8 [place] FlowGate guide catheter. Gentle contrast injection demonstrated no evidence of dissections or of intraluminal filling defects. Over a 0.014 inch Softip Synchro micro guidewire, a 021 Trevo ProVue micro catheter was then advanced to just distal to the origin of the 85 cm 8 [place] FlowGate guide catheter. The micro catheter and micro guidewire were advanced without difficulty to the cranial skull base. However, further advancement of the  micro catheter was met with herniation of the entire platform into the aortic arch. The entire system was then removed and replaced with a 8 French 80 cm Arrow neurovascular sheath which was positioned over an exchange micro guidewire just proximal to the origin of the left internal carotid artery. Over the [employer] guidewire, the 8 [place] 80 cm Arrow neurovascular sheath, the 8 French 85 cm FlowGate guide catheter was then advanced and positioned just proximal to the left internal carotid artery origin. The guidewire was removed. Good aspiration obtained from the hub of the 8 [place] FlowGate guide catheter. Gentle contrast injection demonstrated no evidence of spasms, dissections or of intraluminal filling defects. 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 Over a 0.014 inch Softip Synchro micro guidewire which had a J-tip configuration, the combination of an intermediary 6 French 132 cm Catalyst guide catheter inside of which was a Trevo ProVue micro catheter, the combination was advanced without difficulty to the supraclinoid left ICA. The micro guidewire was then gently advanced through the occluded left middle cerebral artery into the M2 M3 regions of the inferior  division followed by the micro catheter and also followed by the 6 [place] 32 cm Catalyst guide catheter into the proximal left M1 segment. The micro guidewire was then removed. Aspiration was obtained from the hub of the micro catheter. Approximately 3.6 mg of arterial Integrilin were infused 1.8 mg through the micro catheter distally, and 1.8 mg through the 6 [place] Catalyst guide catheter. At this time, a 4 mm x 40 mm Solitaire FR retrieval device which had been prepped and purged with heparinized saline infusion was advanced to the distal end of the micro catheter in a coaxial manner and with constant heparinized saline infusion. The O rings on  delivery micro catheter were then loosened. With slight forward gentle traction with the right hand on the delivery micro guidewire, with the left hand the delivery micro catheter was retrieved until it had been deployed. At this time, the 6 [place] Catalyst 132 cm guide catheter was advanced to just inside the proximal portion of the retrieval device. The occlusion balloon at the origin of the left internal carotid artery was then expanded. Also a 20 mL syringe was hooked to the hub of the 6 [place] Catalyst guide catheter and a constant aspiration was then applied. Thereon after, the combination of the retrieval device, with the micro catheter and the 6 [place] guide catheter were then gently retrieved with constant fluoroscopic guidance and removed. Aspiration was continued with a 60 mL syringe at the hub of the 8 [place] FlowGate guide catheter which was then gently deflated. Free back bleed at the hub of the 8 [place] FlowGate guide catheter was achieved. The aspirate contained multiple focal chunks of grayish bloody thrombus. Similar thrombus was also noted within the 8 [place] Catalyst guide catheter. Clots were also noted in the interstices of the retrieval device. After having obtained free aspiration of blood at the hub of the 8 [place] FlowGate guide catheter, control arteriogram performed through the [facility] guide catheter in the left internal carotid artery demonstrated brisk antegrade flow to the cranial skull base and into the left middle cerebral artery and left anterior cerebral artery distributions to the distal distributions. No angiographic filling defects or occlusions were seen. There was no evidence of spasm either. No mass-effect or midline shift was noted of the major vessels intracranially. The patient's hemodynamic and neurologic status remained stable. A TICI 3 reperfusion had been obtained. The 8 [place] FlowGate guide catheter and the 8 [place] 80 cm Arrow neurovascular sheath were then  retrieved into the abdominal aorta and exchanged over a J-tiip guidewire for a 9 [place] Pinnacle sheath. This in turn was then connected to continuous heparinized saline infusion. The patient's distal pulses remained unchanged Dopplerable early compared to prior to the procedure. The right groin appeared soft as did the left groin at the site of the applied sheath. The patient was transported to the CT scanner for postprocedural CT scan of brain. IMPRESSION: Status post endovascular complete revascularization of occluded left internal carotid artery terminus, the left middle cerebral artery and left anterior cerebral artery with 1 pass with the Solitaire 4 mm x 40 mm retrieval device in combination with aspiration of the hub of the intermediary catheter and proximal flow arrest, achieving a TICI 3 reperfusion as described above. Electronically Signed   By: Luanne Bras M.D.   On: 04/10/2016 13:15    Maxim Bedel, DO  [facility] Pager [phone]  If 7PM-7AM, please contact night-coverage [email or web address] Password [email or web address] 04/25/2016, 3:41 PM   LOS: 10 days

## 2016-04-26 ENCOUNTER — Inpatient Hospital Stay (HOSPITAL_COMMUNITY): Payer: Medicare Other

## 2016-04-26 LAB — BASIC METABOLIC PANEL
ANION GAP: 9 (ref 5–15)
BUN: 35 mg/dL — AB (ref 6–20)
CALCIUM: 8.5 mg/dL — AB (ref 8.9–10.3)
CO2: 34 mmol/L — ABNORMAL HIGH (ref 22–32)
CREATININE: 0.83 mg/dL (ref 0.44–1.00)
Chloride: 94 mmol/L — ABNORMAL LOW (ref 101–111)
GFR calc Af Amer: >60 mL/min (ref >60)
GLUCOSE: 158 mg/dL — AB (ref 65–99)
Potassium: 4 mmol/L (ref 3.5–5.1)
Sodium: 137 mmol/L (ref 135–145)

## 2016-04-26 MED ORDER — METHYLPREDNISOLONE SODIUM SUCC 125 MG IJ SOLR
60.0000 mg | Freq: Two times a day (BID) | INTRAMUSCULAR | Status: DC
  Administered 2016-04-26 – 2016-04-27 (×2): 60 mg via INTRAVENOUS
  Filled 2016-04-26 (×2): qty 2

## 2016-04-26 NOTE — Progress Notes (Addendum)
PROGRESS NOTE  Catherine Day ZOX:[id number] DOB: Feb 27, 1928 DOA: 04/15/2016 PCP: No primary care provider on file.  Brief Narrative: 82 yo right-handed female from [place] w/ a Hxof COPD (on 2 L O2) and HLD who presented on April 10, 2016 with thesudden onset of right-sided weakness and an inability to speak. CT and MRI showed small volume scattered acute infarctsin theleft hemisphere primarily in the left temporal and occipital lobes. No associated mass effect. Small number of other scattered small bilateral MCA left cerebellar artery territory infarcts. The patient did receive tPA. MRI showed possible occlusion of the left PCA-P3 inferior division. Underwent bilateral common carotid arteriograms followed by complete revascularization of occluded left MCA, left ACA, left ICA terminus. Neurology consulted, maintained on aspirin therapy for CVA prophylaxis. TEE noted no intracardiac source of embolism or shunt. She was transferred to CIR, but while there she suffered worsening hypoxia and on 11/29 required transfer back to the acute hospital for aspiration pneumonitis.   Assessment & Plan: Acute on chronic respiratory failure with hypoxia -multifactorial including aspiration pneumonia, CHF in the setting of underlying COPD with exacerbation -stable on 3 L -continue pulmicort  -flutter valve -continue duonebs q 8hours -normally on 2 L [place] at home  Acutetoxic Encephalopathy -04/23/16--pt fell asleep when not engaged directly; lethargic -received Ativan 1 mg IV at 0430 on 04/23/16 for agitation -minimize benzo; haldol 1 mg IV q 6 hrs prn agitation -04/23/16-personally reviwed CXR--R- basilar opacity, small left effusion--pt already on merrem -04/24/16--much more alert, pleasantly confused--back to baseline per daughter -04/23/16 urine culture neg  Acute on Chronic Systolic and diastolicCHF - pt has some evidence of fluid overload -Continue IV lasix--increase to 60 mg  daily -NEG 7.2L -04/13/16 TEE--EF 45-50%, grade 1 DD, normal RV -appears previous dry weight ~170 -held ARB due to soft BP--now improving  COPD Exacerbation -increase steroids to solumedrol 60 mg bid -continue DuoNeb q 8 -continue pulmicort  Aspiration pneumonia / pneumonitis - completed7days antibiotics - cleared for D3 diet w/ nectar liquids by SLP  - reconsult speech therapy--continued aspiration--worsen bronchospasm  PositiveESBL Escherichia coliUTI - Finished meropenem D#7of 7 on 12/8  Recent CVA/New hearing loss - s/p IV TPA and IR treatments with  - repeat MRI brain  Hypokalemia - Replaced  Lower extremity pain and edema -venous duplex--NEG  Goals of Care -12/7-long discussion with daughter at bedside-->change to DNR -plan for SNF vs CIR   DVT prophylaxis:heparin [place] Code Status:Full code Family Communication:Son and daughter updatedat bedside 12/10--Total time spent 45 minutes. Greater than 50% spent face to face counseling and coordinating care.  Disposition Plan:CIR vs SNF  Consultants:  PCCM  Procedures:  None   Antimicrobials: Zosyn 11/29 >12/2 Vancomycin 11/29 >12/2 Meropenem 12/2 >     Subjective: Patient is somewhat short of breath with exertion. She is hard of hearing. Denies any nausea, vomiting, diarrhea, abdominal pain. Denies any headache or neck pain. No chest pain.  Objective: Vitals:   04/26/16 0503 04/26/16 0512 04/26/16 0957 04/26/16 1454  BP:  (!) 120/49    Pulse:  74    Resp:  20    Temp:  97.5 F (36.4 C)    TempSrc:      SpO2:  98% 97% 96%  Weight: 72 kg (158 lb 11.7 oz)     Height:        Intake/Output Summary (Last 24 hours) at 04/26/16 1609 Last data filed at 04/26/16 1535  Gross per 24  hour  Intake                0 ml  Output             1900 ml  Net            -1900 ml   Weight change: -2 kg (-4 lb 6.6 oz) Exam:   General:  Pt is alert, follows commands appropriately, not  in acute distress  HEENT: No icterus, No thrush, No neck mass, /AT  Cardiovascular: RRR, S1/S2, no rubs, no gallops  Respiratory: Bilateral expiratory wheeze. Good air movement.  Abdomen: Soft/+BS, non tender, non distended, no guarding  Extremities: 1 +LE edema, No lymphangitis, No petechiae, No rashes, no synovitis   Data Reviewed: I have personally reviewed following labs and imaging studies Basic Metabolic Panel:  Recent Labs Lab 04/20/16 0200  04/22/16 0617 04/23/16 0442 04/24/16 0554 04/25/16 0549 04/26/16 0446  NA 137  < > 139 139 137 138 137  K 4.4  < > 3.5 3.7 3.6 3.6 4.0  CL 101  < > 93* 96* 92* 94* 94*  CO2 29  < > 37* 29 34* 33* 34*  GLUCOSE 141*  < > 95 104* 109* 108* 158*  BUN 11  < > 27* 27* 24* 28* 35*  CREATININE 0.80  < > 0.90 0.88 0.86 0.71 0.83  CALCIUM 7.8*  < > 8.7* 8.5* 8.4* 8.4* 8.5*  MG 1.9  --   --  2.0  --   --   --   < > = values in this interval not displayed. Liver Function Tests:  Recent Labs Lab 04/21/16 0523  AST 29  ALT 24  ALKPHOS 50  BILITOT 0.6  PROT 6.0*  ALBUMIN 2.6*   No results for input(s): LIPASE, AMYLASE in the last 168 hours. No results for input(s): AMMONIA in the last 168 hours. Coagulation Profile: No results for input(s): INR, PROTIME in the last 168 hours. CBC:  Recent Labs Lab 04/21/16 0523 04/22/16 0617 04/23/16 0442 04/24/16 0554  WBC 12.8* 15.4* 11.8* 10.7*  HGB 10.8* 11.8* 11.2* 11.8*  HCT 35.5* 38.7 35.9* 37.4  MCV 87.7 86.6 87.1 87.2  PLT 366 384 315 265   Cardiac Enzymes: No results for input(s): CKTOTAL, CKMB, CKMBINDEX, TROPONINI in the last 168 hours. BNP: Invalid input(s): POCBNP CBG: No results for input(s): GLUCAP in the last 168 hours. HbA1C: No results for input(s): HGBA1C in the last 72 hours. Urine analysis:    Component Value Date/Time   COLORURINE COLORLESS (A) 04/23/2016 1248   APPEARANCEUR CLEAR 04/23/2016 1248   LABSPEC 1.005 04/23/2016 1248   PHURINE 7.0  04/23/2016 1248   GLUCOSEU NEGATIVE 04/23/2016 1248   HGBUR SMALL (A) 04/23/2016 1248   BILIRUBINUR NEGATIVE 04/23/2016 1248   KETONESUR NEGATIVE 04/23/2016 1248   PROTEINUR NEGATIVE 04/23/2016 1248   NITRITE NEGATIVE 04/23/2016 1248   LEUKOCYTESUR LARGE (A) 04/23/2016 1248   Sepsis Labs: _0 (procalcitonin:4,lacticidven:4) ) Recent Results (from the past 240 hour(s))  Urine culture     Status: None   Collection Time: 04/16/16  4:42 PM  Result Value Ref Range Status   Specimen Description URINE, CLEAN CATCH  Final   Special Requests PATIENT ON FOLLOWING ZOSYN  Final   Culture NO GROWTH  Final   Report Status 04/17/2016 FINAL  Final  Urine culture     Status: None   Collection Time: 04/23/16 12:48 PM  Result Value Ref Range Status   Specimen Description URINE, RANDOM  Final   Special Requests NONE  Final   Culture NO GROWTH  Final   Report Status 04/24/2016 FINAL  Final     Scheduled Meds: . aspirin  300 mg Rectal Daily  . budesonide (PULMICORT) nebulizer solution  0.5 mg Nebulization BID  . famotidine  20 mg Oral Daily  . furosemide  60 mg Intravenous Daily  . heparin  5,000 Units Subcutaneous Q8H  . ipratropium-albuterol  3 mL Nebulization TID  . mouth rinse  15 mL Mouth Rinse BID  . predniSONE  50 mg Oral Q breakfast   Continuous Infusions:  Procedures/Studies: Ct Head Wo Contrast  Result Date: 04/09/2016 CLINICAL DATA:  82 y/o F; code stroke post transarterial intervention. EXAM: CT HEAD WITHOUT CONTRAST TECHNIQUE: Contiguous axial images were obtained from the base of the skull through the vertex without intravenous contrast. COMPARISON:  04/09/2016 CT head. FINDINGS: Brain: No large territory acute infarct or intracranial hemorrhage identified. Patchy foci of hypoattenuation in periventricular and subcortical white matter may represent areas of infarction or chronic microvascular ischemic changes and are stable. Mild parenchymal volume loss. No focal mass  effect. No extra-axial collection. No effacement of basilar cisterns. Vascular: Persistent opacification from previous contrast administration. Calcific atherosclerosis of carotid siphons. Skull: Normal. Negative for fracture or focal lesion. Sinuses/Orbits: Partial opacification of ethmoid and sphenoid sinuses with aerosolized secretions and partial opacification of mastoid air cells probably related to intubation. Orbits are unremarkable. Other: None. IMPRESSION: No large territory acute infarct or intracranial hemorrhage identified. Patchy foci of hypoattenuation in periventricular and subcortical white matter may represent areas of infarction or chronic microvascular ischemic changes and are stable. If clinically indicated MRI is more sensitive for acute ischemia. Electronically Signed   By: Kristine Garbe M.D.   On: 04/09/2016 22:51   Mr Virgel Paling BM Contrast  Result Date: 04/10/2016 CLINICAL DATA:  82 year old female status post neuro intervention on 04/09/2016 for acute onset right side weakness and inability to speak. Status post Bilateral cerebral angiogram with Revascularization of occluded LT MCA ,Lt ACA and Lt ICA terminus with x1 PASS WITH A COMBINATION OF solitaire 32m x 40 mm retrieval device with simultaneous aspiration at the intermediate guide catheter and flow gate guide catheter, and 3.6 mg of superselective intracranial INTEGRELIN. Initial encounter. EXAM: MRI HEAD WITHOUT CONTRAST MRA HEAD WITHOUT CONTRAST TECHNIQUE: Multiplanar, multiecho pulse sequences of the brain and surrounding structures were obtained without intravenous contrast. Angiographic images of the head were obtained using MRA technique without contrast. COMPARISON:  Post tPA head CT 04/09/2016 FINDINGS: MRI HEAD FINDINGS Brain: Multiple small cortically based and white-matter foci of restricted diffusion in the left temporal lobe, best seen on axial diffusion weighted imaging (series 4, image 20). Evidence of  petechial hemorrhage in the left mesial temporal lobe which appears to track to the left cauda thalamic groove where diffusion is also heterogeneous. These areas demonstrate mild T2 and FLAIR hyperintensity without mass effect. In addition there are several other widely scattered mostly cortically based foci of restricted diffusion in both MCA territories at over the superior convexities. Similar scattered small infarcts in the left occipital lobe. One or 2 such areas in the left cerebellum. No other intracranial hemorrhage. No intracranial mass effect or ventriculomegaly. Superimposed patchy bilateral cerebral white matter T2 and FLAIR hyperintensity. No chronic cortical encephalomalacia. Right deep gray matter nuclei and brainstem are normal for age. Negative pituitary and cervicomedullary junction. Vascular: Major intracranial vascular flow voids are preserved. Skull and upper cervical spine: Negative. Normal  bone marrow signal. Sinuses/Orbits: Normal orbits soft tissues. Trace paranasal sinus mucosal thickening. Mild mastoid effusions. Other: Negative scalp soft tissues. MRA HEAD FINDINGS Mildly motion degraded. Antegrade flow in the posterior circulation with mildly dominant distal right vertebral artery. Normal PICA origins. Patent vertebrobasilar junction. Patent basilar artery without stenosis. Patent SCA and normal right PCA origins. Fetal type left PCA origin. Right posterior communicating artery diminutive or absent. Absent flow signal in the inferior left PCA P3 division. Otherwise the bilateral PCA branches appear normal. Antegrade flow in both ICA siphons with some generalized ICA dolichoectasia and tortuosity of the distal cervical right ICA. Patent carotid termini. Normal MCA and ACA origins. Generalized ACA dolichoectasia. Proximal ACA branches appear normal. Both MCA bifurcations appear patent. MCA branch detail is degraded by motion. Fairly symmetric appearance of bilateral MCA branch flow signal.  IMPRESSION: 1. Small volume of scattered acute infarcts in the left hemisphere primarily in the left temporal and occipital lobes (note fetal type left PCA origin anatomy). 2. Petechial hemorrhage in the mesial left temporal lobe tracking to the cauda thalamic groove. No associated mass effect and no malignant hemorrhagic transformation. 3. A small number of other scattered small infarcts in the bilateral MCA and left cerebellar artery territories might be related to endovascular intervention. 4. Motion degraded intracranial MRA negative for emergent large vessel occlusion or proximal intracranial stenosis. Possible occlusion of the left PCA P3 inferior division. Electronically Signed   By: Genevie Ann M.D.   On: 04/10/2016 19:16   Mr Brain Wo Contrast  Result Date: 04/10/2016 CLINICAL DATA:  82 year old female status post neuro intervention on 04/09/2016 for acute onset right side weakness and inability to speak. Status post Bilateral cerebral angiogram with Revascularization of occluded LT MCA ,Lt ACA and Lt ICA terminus with x1 PASS WITH A COMBINATION OF solitaire 7m x 40 mm retrieval device with simultaneous aspiration at the intermediate guide catheter and flow gate guide catheter, and 3.6 mg of superselective intracranial INTEGRELIN. Initial encounter. EXAM: MRI HEAD WITHOUT CONTRAST MRA HEAD WITHOUT CONTRAST TECHNIQUE: Multiplanar, multiecho pulse sequences of the brain and surrounding structures were obtained without intravenous contrast. Angiographic images of the head were obtained using MRA technique without contrast. COMPARISON:  Post tPA head CT 04/09/2016 FINDINGS: MRI HEAD FINDINGS Brain: Multiple small cortically based and white-matter foci of restricted diffusion in the left temporal lobe, best seen on axial diffusion weighted imaging (series 4, image 20). Evidence of petechial hemorrhage in the left mesial temporal lobe which appears to track to the left cauda thalamic groove where diffusion is  also heterogeneous. These areas demonstrate mild T2 and FLAIR hyperintensity without mass effect. In addition there are several other widely scattered mostly cortically based foci of restricted diffusion in both MCA territories at over the superior convexities. Similar scattered small infarcts in the left occipital lobe. One or 2 such areas in the left cerebellum. No other intracranial hemorrhage. No intracranial mass effect or ventriculomegaly. Superimposed patchy bilateral cerebral white matter T2 and FLAIR hyperintensity. No chronic cortical encephalomalacia. Right deep gray matter nuclei and brainstem are normal for age. Negative pituitary and cervicomedullary junction. Vascular: Major intracranial vascular flow voids are preserved. Skull and upper cervical spine: Negative. Normal bone marrow signal. Sinuses/Orbits: Normal orbits soft tissues. Trace paranasal sinus mucosal thickening. Mild mastoid effusions. Other: Negative scalp soft tissues. MRA HEAD FINDINGS Mildly motion degraded. Antegrade flow in the posterior circulation with mildly dominant distal right vertebral artery. Normal PICA origins. Patent vertebrobasilar junction. Patent basilar artery without  stenosis. Patent SCA and normal right PCA origins. Fetal type left PCA origin. Right posterior communicating artery diminutive or absent. Absent flow signal in the inferior left PCA P3 division. Otherwise the bilateral PCA branches appear normal. Antegrade flow in both ICA siphons with some generalized ICA dolichoectasia and tortuosity of the distal cervical right ICA. Patent carotid termini. Normal MCA and ACA origins. Generalized ACA dolichoectasia. Proximal ACA branches appear normal. Both MCA bifurcations appear patent. MCA branch detail is degraded by motion. Fairly symmetric appearance of bilateral MCA branch flow signal. IMPRESSION: 1. Small volume of scattered acute infarcts in the left hemisphere primarily in the left temporal and occipital lobes  (note fetal type left PCA origin anatomy). 2. Petechial hemorrhage in the mesial left temporal lobe tracking to the cauda thalamic groove. No associated mass effect and no malignant hemorrhagic transformation. 3. A small number of other scattered small infarcts in the bilateral MCA and left cerebellar artery territories might be related to endovascular intervention. 4. Motion degraded intracranial MRA negative for emergent large vessel occlusion or proximal intracranial stenosis. Possible occlusion of the left PCA P3 inferior division. Electronically Signed   By: Genevie Ann M.D.   On: 04/10/2016 19:16   Dg Chest Port 1 View  Result Date: 04/23/2016 CLINICAL DATA:  Aspiration pneumonia EXAM: PORTABLE CHEST 1 VIEW COMPARISON:  04/23/2016 at 12:20 FINDINGS: Mild streaky opacity in the right days. Left lung is clear. Pulmonary vasculature is normal. No effusions. Hilar, mediastinal and cardiac contours are unremarkable and unchanged. IMPRESSION: Streaky right base opacity. No focal confluent consolidation. No significant change in the interim. Electronically Signed   By: Andreas Newport M.D.   On: 04/23/2016 23:41   Dg Chest Port 1 View  Result Date: 04/23/2016 CLINICAL DATA:  COPD, aspiration pneumonia. EXAM: PORTABLE CHEST 1 VIEW COMPARISON:  Portable chest x-ray of Sep 20, 2015 FINDINGS: The lungs are mildly hyperinflated. The interstitial markings are coarse. Increased density at the right lung base is consistent with the clinically suspected aspiration pneumonia. The heart and pulmonary vascularity are normal. There is calcification in the wall of the tortuous thoracic aorta. There is no pleural effusion. The bony thorax exhibits no acute abnormality. IMPRESSION: Right basilar pneumonia consistent with aspiration. COPD. Thoracic aortic atherosclerosis. When the patient can tolerate the procedure, a PA and lateral chest x-ray would be useful. Electronically Signed   By: Merrianne Mccumbers  [place] M.D.   On: 04/23/2016  12:34   Dg Chest Port 1 View  Result Date: 04/21/2016 CLINICAL DATA:  Initial evaluation for acute respiratory distress, wheezing. History of COPD. EXAM: PORTABLE CHEST 1 VIEW COMPARISON:  Prior radiograph from 04/21/2016. FINDINGS: Cardiac and mediastinal silhouettes are stable in size and contour, and remain within normal limits. Atherosclerotic disease noted within the aortic arch. Lungs are hypoinflated. Changes related COPD noted. Increased right basilar opacity favored to reflect atelectasis/bronchovascular crowding, although recurrent infiltrate not excluded. Left basilar atelectasis noted. Small left pleural effusion. No pulmonary edema or pneumothorax. No acute osseous abnormality. IMPRESSION: 1. Shallow lung inflation with increased right basilar opacity, favored to reflect atelectasis/ bronchovascular crowding, although recurrent infiltrate could be considered in the correct clinical setting. 2. Small left pleural effusion with associated atelectasis. 3. COPD. Electronically Signed   By: Jeannine Boga M.D.   On: 04/21/2016 20:38   Dg Chest Port 1 View  Result Date: 04/21/2016 CLINICAL DATA:  Follow-up wheezing, COPD. EXAM: PORTABLE CHEST 1 VIEW COMPARISON:  04/20/2016 FINDINGS: There is hyperinflation of the lungs compatible with COPD.  Heart and mediastinal contours are within normal limits. No focal opacities or effusions. No acute bony abnormality. IMPRESSION: COPD.  No active disease. Electronically Signed   By: Rolm Baptise M.D.   On: 04/21/2016 08:27   Dg Chest Port 1 View  Result Date: 04/20/2016 CLINICAL DATA:  Pneumonia. EXAM: PORTABLE CHEST 1 VIEW COMPARISON:  04/16/2016. FINDINGS: Mediastinum and hilar structures are normal. Heart size stable. Low lung volumes with basilar atelectasis. Slight improvement of right base infiltrate. A component of right base bronchiectasis cannot be excluded. Tiny left pleural effusion. No pneumothorax. IMPRESSION: 1. Interim partial clearing of  right base infiltrate. A component of bronchiectasis in the right base cannot be excluded. 2. Low lung volumes.  Small left pleural effusion noted. Electronically Signed   By: Marcello Moores  Register   On: 04/20/2016 07:20   Dg Chest Port 1 View  Result Date: 04/16/2016 CLINICAL DATA:  Acute onset of shortness of breath. Initial encounter. EXAM: PORTABLE CHEST 1 VIEW COMPARISON:  Chest radiograph performed 04/14/2016 FINDINGS: Persistent right basilar airspace opacity remains concerning for pneumonia. No definite pleural effusion or pneumothorax is seen. Mild vascular congestion is noted. The cardiomediastinal silhouette is mildly enlarged. No acute osseous abnormalities are identified. IMPRESSION: 1. Persistent right basilar airspace opacity remains concerning for pneumonia. 2. Mild vascular congestion and mild cardiomegaly. Electronically Signed   By: Garald Balding M.D.   On: 04/16/2016 06:26   Dg Chest Port 1 View  Result Date: 04/14/2016 CLINICAL DATA:  Acute onset of shortness of breath and cough. Initial encounter. EXAM: PORTABLE CHEST 1 VIEW COMPARISON:  Chest radiograph performed 04/11/2016 FINDINGS: Right basilar airspace opacity raises concern for pneumonia. No pleural effusion or pneumothorax is seen. The cardiomediastinal silhouette is borderline normal in size. No acute osseous abnormalities are identified. IMPRESSION: Right basilar pneumonia noted. Electronically Signed   By: Garald Balding M.D.   On: 04/14/2016 21:50   Dg Chest Port 1 View  Result Date: 04/11/2016 CLINICAL DATA:  82 year old female with respiratory failure EXAM: PORTABLE CHEST 1 VIEW COMPARISON:  Prior chest x-ray 04/09/2016 FINDINGS: The patient has been extubated and the nasogastric tube removed. Inspiratory volumes are lower. Nonspecific patchy opacities in the right greater than left base. Stable cardiomegaly. Mild pulmonary vascular congestion without edema. Atherosclerotic, ectatic and tortuous thoracic aorta. Right IJ  approach central venous catheter remains in unchanged position. The tip of the catheter is at the superior cavoatrial junction. Osseous structures are intact and unremarkable. IMPRESSION: 1. Interval extubation and removal of nasogastric tube. Right IJ central venous catheter remains in stable and satisfactory position. 2. Lower inspiratory volumes with increased bibasilar atelectasis and pulmonary vascular congestion. 3.  Aortic Atherosclerosis (ICD10-170.0) Electronically Signed   By: Jacqulynn Cadet M.D.   On: 04/11/2016 08:12   Dg Chest Port 1 View  Result Date: 04/10/2016 CLINICAL DATA:  Endotracheal tube and orogastric tube placement. Initial encounter. EXAM: PORTABLE CHEST 1 VIEW COMPARISON:  None. FINDINGS: The patient's endotracheal tube is seen ending 2 cm above the carina. A right IJ line is noted ending about the distal SVC. An enteric tube is noted extending below the diaphragm. Mild bibasilar atelectasis is noted. No pleural effusion or pneumothorax is seen. The cardiomediastinal silhouette is borderline normal in size. No acute osseous abnormalities are identified. IMPRESSION: 1. Endotracheal tube seen ending 2 cm above the carina. 2. Right IJ line noted ending about the distal SVC. 3. Enteric tube noted extending below the diaphragm. 4. Mild bibasilar atelectasis. Electronically Signed   By:  Garald Balding M.D.   On: 04/10/2016 00:02   Dg Abd Portable 1v  Result Date: 04/09/2016 CLINICAL DATA:  82 y/o  F; OG tube placement. EXAM: PORTABLE ABDOMEN - 1 VIEW COMPARISON:  None. FINDINGS: Patient is rotated. Normal bowel gas pattern. Enteric tube tip is below the diaphragm probably in the distal stomach. Slight densities projecting over the kidneys bilaterally may represent stones. Degenerative changes of the spine. IMPRESSION: Enteric tube tip probably in distal stomach. Densities projecting over kidneys may represent nephrolithiasis. Electronically Signed   By: Kristine Garbe M.D.    On: 04/09/2016 23:56   Dg Swallowing Func-speech Pathology  Result Date: 04/17/2016 Objective Swallowing Evaluation: Type of Study: MBS-Modified Barium Swallow Study Patient Details Name: Jovanna Hodges MRN: [id number] Date of Birth: 1928/04/18 Today's Date: 04/17/2016 Time: SLP Start Time (ACUTE ONLY): 1045-SLP Stop Time (ACUTE ONLY): 1115 SLP Time Calculation (min) (ACUTE ONLY): 30 min Past Medical History: Past Medical History: Diagnosis Date . COPD (chronic obstructive pulmonary disease) ([place])  Past Surgical History: Past Surgical History: Procedure Laterality Date . IR GENERIC HISTORICAL  04/09/2016  IR ANGIO INTRA EXTRACRAN SEL COM CAROTID INNOMINATE UNI R MOD SED 04/09/2016 Luanne Bras, MD [facility] . IR GENERIC HISTORICAL  04/09/2016  IR PERCUTANEOUS ART THROMBECTOMY/INFUSION INTRACRANIAL INC DIAG ANGIO 04/09/2016 Luanne Bras, MD [facility] . PELVIC FRACTURE SURGERY   . RADIOLOGY WITH ANESTHESIA N/A 04/09/2016  Procedure: RADIOLOGY WITH ANESTHESIA;  Surgeon: Luanne Bras, MD;  Location: [place];  Service: Radiology;  Laterality: N/A; . TEE WITHOUT CARDIOVERSION N/A 04/13/2016  Procedure: TRANSESOPHAGEAL ECHOCARDIOGRAM (TEE);  Surgeon: Sanda Klein, MD;  Location: [facility] ENDOSCOPY;  Service: Cardiovascular;  Laterality: N/A; HPI: 82 y.o. right handed female with h/o COPD. Presented 04/10/2016 with sudden onset of right-sided weakness and inability to speak. 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 Pt discharged to CIR 11/28, however, later that night she became dyspneic and diaphoretic. She was given albuterol nebs with some improvement. CXR with RLL infiltrate. Subjective: pt awake, pleasant Assessment / Plan / Recommendation CHL IP CLINICAL IMPRESSIONS 04/17/2016 Therapy Diagnosis Mild oral phase dysphagia;Mild pharyngeal phase dysphagia Clinical  Impression Pt's swallow function demonstrates mild improvements since [facility] 11/26.  She presents with piecemeal oral bolusing; persisting delay in initiation (valleculae and pyriforms) -however, this is not necessarily considered disordered in the elderly population.  There may have been questionable, high penetration of liquids, but it was difficult to discern.  No aspiration. Pyriform sinuses appear to be asymmetric and there is mild residue remaining post-swallow. Recommend advancing diet to dysphagia 3, nectar-thick liquids for now.  Will follow for functional toleration/safety.   Impact on safety and function Mild aspiration risk   CHL IP TREATMENT RECOMMENDATION 04/17/2016 Treatment Recommendations Therapy as outlined in treatment plan below   Prognosis 04/17/2016 Prognosis for Safe Diet Advancement Good Barriers to Reach Goals -- Barriers/Prognosis Comment -- CHL IP DIET RECOMMENDATION 04/17/2016 SLP Diet Recommendations Dysphagia 3 (Mech soft) solids;Nectar thick liquid Liquid Administration via Cup Medication Administration Whole meds with puree Compensations Small sips/bites;Slow rate Postural Changes --   CHL IP OTHER RECOMMENDATIONS 04/17/2016 Recommended Consults -- Oral Care Recommendations Oral care BID Other Recommendations --   CHL IP FOLLOW UP RECOMMENDATIONS 04/17/2016 Follow up Recommendations Inpatient Rehab   CHL IP FREQUENCY AND DURATION 04/17/2016 Speech Therapy Frequency (ACUTE ONLY) min 2x/week Treatment Duration 2 weeks  CHL IP ORAL PHASE 04/17/2016 Oral Phase Impaired Oral - Pudding Teaspoon -- Oral - Pudding Cup -- Oral - Honey Teaspoon -- Oral - Honey Cup -- Oral - Nectar Teaspoon -- Oral - Nectar Cup -- Oral - Nectar Straw -- Oral - Thin Teaspoon -- Oral - Thin Cup -- Oral - Thin Straw -- Oral - Puree Piecemeal swallowing Oral - Mech Soft Piecemeal swallowing Oral - Regular -- Oral - Multi-Consistency -- Oral - Pill -- Oral Phase - Comment --  CHL IP PHARYNGEAL PHASE 04/17/2016 Pharyngeal  Phase Impaired Pharyngeal- Pudding Teaspoon -- Pharyngeal -- Pharyngeal- Pudding Cup -- Pharyngeal -- Pharyngeal- Honey Teaspoon -- Pharyngeal -- Pharyngeal- Honey Cup NT Pharyngeal -- Pharyngeal- Nectar Teaspoon -- Pharyngeal -- Pharyngeal- Nectar Cup Pharyngeal residue - pyriform;Delayed swallow initiation-pyriform sinuses Pharyngeal Material enters airway, remains ABOVE vocal cords then ejected out Pharyngeal- Nectar Straw -- Pharyngeal -- Pharyngeal- Thin Teaspoon -- Pharyngeal -- Pharyngeal- Thin Cup Delayed swallow initiation-pyriform sinuses;Pharyngeal residue - pyriform Pharyngeal Material enters airway, remains ABOVE vocal cords then ejected out Pharyngeal- Thin Straw -- Pharyngeal -- Pharyngeal- Puree Delayed swallow initiation-vallecula;Pharyngeal residue - pyriform Pharyngeal -- Pharyngeal- Mechanical Soft Delayed swallow initiation-vallecula;Pharyngeal residue - pyriform Pharyngeal -- Pharyngeal- Regular -- Pharyngeal -- Pharyngeal- Multi-consistency -- Pharyngeal -- Pharyngeal- Pill -- Pharyngeal -- Pharyngeal Comment --  CHL IP CERVICAL ESOPHAGEAL PHASE 04/12/2016 Cervical Esophageal Phase WFL Pudding Teaspoon -- Pudding Cup -- Honey Teaspoon -- Honey Cup -- Nectar Teaspoon -- Nectar Cup -- Nectar Straw -- Thin Teaspoon -- Thin Cup -- Thin Straw -- Puree -- Mechanical Soft -- Regular -- Multi-consistency -- Pill -- Cervical Esophageal Comment -- No flowsheet data found. Juan Quam Laurice 04/17/2016, 11:50 AM              Dg Swallowing Func-speech Pathology  Result Date: 04/12/2016 Objective Swallowing Evaluation: Type of Study: MBS-Modified Barium Swallow Study Patient Details Name: Aliea Bobe MRN: [id number] Date of Birth: 29-Jun-1927 Today's Date: 04/12/2016 Time: SLP Start Time (ACUTE ONLY): 1116-SLP Stop Time (ACUTE ONLY): 1131 SLP Time Calculation (min) (ACUTE ONLY): 15 min Past Medical History: Past Medical History: Diagnosis Date . COPD (chronic obstructive pulmonary disease) ([place])  Past  Surgical History: Past Surgical History: Procedure Laterality Date . PELVIC FRACTURE SURGERY   . RADIOLOGY WITH ANESTHESIA N/A 04/09/2016  Procedure: RADIOLOGY WITH ANESTHESIA;  Surgeon: Luanne Bras, MD;  Location: [place];  Service: Radiology;  Laterality: N/A; HPI: Ms Mcreynolds is an 82 yo female with h/o COPD admitted with mental status change, right sided weakness, hemianopsia, left gaze preference,  Pt required intubation and was extubated yesterday.  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  Swallow and speech eval ordered Subjective: pt awake, pleasant Assessment / Plan / Recommendation CHL IP CLINICAL IMPRESSIONS 04/12/2016 Therapy Diagnosis Moderate pharyngeal phase dysphagia Clinical Impression Pt has a moderate pharyngeal dysphagia due in large part to delayed swallow initiation and decreased sensation of airway penetration. Liquids spill to the pyriform sinuses, resulting in deep, silent penetration of thin and nectar thick liquids. Although this does not occur with every bolus, it is silent and unable to be completely cleared with cued coughing, making her at a higher risk for an aspiration-related infection. Her strength is adequate with no significant residuals remaining post-swallow. Recommend Dys 3 diet and honey thick liquids. SLP to f/u for tolerance and readiness to advance. Impact on safety and function Moderate aspiration risk   CHL IP  TREATMENT RECOMMENDATION 04/12/2016 Treatment Recommendations Therapy as outlined in treatment plan below   Prognosis 04/12/2016 Prognosis for Safe Diet Advancement Good Barriers to Reach Goals -- Barriers/Prognosis Comment -- CHL IP DIET RECOMMENDATION 04/12/2016 SLP Diet Recommendations Dysphagia 3 (Mech soft) solids;Honey thick liquids Liquid Administration via Cup;No straw Medication Administration Whole meds with puree Compensations Slow rate;Small sips/bites Postural  Changes Remain semi-upright after after feeds/meals (Comment);Seated upright at 90 degrees   CHL IP OTHER RECOMMENDATIONS 04/12/2016 Recommended Consults -- Oral Care Recommendations Oral care BID Other Recommendations Order thickener from pharmacy;Prohibited food (jello, ice cream, thin soups);Remove water pitcher   CHL IP FOLLOW UP RECOMMENDATIONS 04/12/2016 Follow up Recommendations Inpatient Rehab   CHL IP FREQUENCY AND DURATION 04/12/2016 Speech Therapy Frequency (ACUTE ONLY) min 2x/week Treatment Duration 2 weeks      CHL IP ORAL PHASE 04/12/2016 Oral Phase WFL Oral - Pudding Teaspoon -- Oral - Pudding Cup -- Oral - Honey Teaspoon -- Oral - Honey Cup -- Oral - Nectar Teaspoon -- Oral - Nectar Cup -- Oral - Nectar Straw -- Oral - Thin Teaspoon -- Oral - Thin Cup -- Oral - Thin Straw -- Oral - Puree -- Oral - Mech Soft -- Oral - Regular -- Oral - Multi-Consistency -- Oral - Pill -- Oral Phase - Comment --  CHL IP PHARYNGEAL PHASE 04/12/2016 Pharyngeal Phase Impaired Pharyngeal- Pudding Teaspoon -- Pharyngeal -- Pharyngeal- Pudding Cup -- Pharyngeal -- Pharyngeal- Honey Teaspoon -- Pharyngeal -- Pharyngeal- Honey Cup Delayed swallow initiation-pyriform sinuses Pharyngeal -- Pharyngeal- Nectar Teaspoon -- Pharyngeal -- Pharyngeal- Nectar Cup Delayed swallow initiation-pyriform sinuses;Penetration/Aspiration before swallow Pharyngeal Material enters airway, remains ABOVE vocal cords and not ejected out Pharyngeal- Nectar Straw -- Pharyngeal -- Pharyngeal- Thin Teaspoon -- Pharyngeal -- Pharyngeal- Thin Cup Delayed swallow initiation-pyriform sinuses;Penetration/Aspiration before swallow Pharyngeal Material enters airway, CONTACTS cords and not ejected out Pharyngeal- Thin Straw -- Pharyngeal -- Pharyngeal- Puree Delayed swallow initiation-vallecula Pharyngeal -- Pharyngeal- Mechanical Soft Delayed swallow initiation-vallecula Pharyngeal -- Pharyngeal- Regular -- Pharyngeal -- Pharyngeal- Multi-consistency --  Pharyngeal -- Pharyngeal- Pill -- Pharyngeal -- Pharyngeal Comment --  CHL IP CERVICAL ESOPHAGEAL PHASE 04/12/2016 Cervical Esophageal Phase WFL Pudding Teaspoon -- Pudding Cup -- Honey Teaspoon -- Honey Cup -- Nectar Teaspoon -- Nectar Cup -- Nectar Straw -- Thin Teaspoon -- Thin Cup -- Thin Straw -- Puree -- Mechanical Soft -- Regular -- Multi-consistency -- Pill -- Cervical Esophageal Comment -- No flowsheet data found. Germain Osgood 04/12/2016, 1:05 PM  Germain Osgood, M.A. CCC-SLP [phone]             Ir Percutaneous Art Thrombectomy/infusion Intracranial Inc Diag Angio  Result Date: 04/13/2016 CLINICAL DATA:  Right-sided hemiplegia, left gaze preference, confusion, global aphasia. EXAM: IR PERCUTANEOUS ART THORMBECTOMY/INFUSION INTRACRANIAL INCLUDE DIAG ANGIO; IR ANGIO INTRA EXTRACRAN SEL COM CAROTID INNOMINATE UNI RIGHT MOD SED PROCEDURE: Following a full explanation of the procedure along with the potential associated complications, an informed witnessed consent was obtained. Risks of intracranial hemorrhage of 10-15%, worsening neurological deficit, death, and the inability to revascularize were all reviewed in detail with the patient's daughter. Informed consent was obtained. The patient was then put under general anesthesia by the Department of Anesthesiology at [facility]. The right groin was prepped and draped in the usual sterile fashion. Thereafter using modified Seldinger technique, transfemoral access into the right common femoral artery was obtained without difficulty. Over a 0.035 inch guidewire a 5 French Pinnacle sheath was inserted. Through this, and also over a 0.035 inch guidewire  a 5 [place] JB 1 catheter was advanced to the aortic arch region and selectively positioned in the left common carotid artery and later in the right common carotid artery. There were no acute complications. The patient tolerated the procedure well. Contrast: Isovue 300 approximately 70 mL.  Anesthesia/Sedation:  General anesthesia. Medications: As per general anesthesia. FINDINGS: The right common carotid arteriogram demonstrates moderate to severe tortuosity of the left common carotid artery in its proximal portion with acute angular takeoff towards the aortic arch. Slow flow was seen ascending to the left internal carotid artery. There was no angiographic visualization of the left external carotid artery consistent with occlusion. The left internal carotid artery at the bulb demonstrated no occlusions with wide patency. 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 The right common carotid arteriogram demonstrates the right external carotid artery and its major branches to be widely patent. The right internal carotid at the bulb and just distally appears widely patent. Focal areas of focal outpouching are seen to emanate from the posterior wall of the right internal carotid artery in the proximal one-third. Distal to this there is a double U shaped tortuosity of the mid left internal carotid artery. Distal to this the left internal carotid artery is seen to opacify normally to the cranial skull base. There is mild fusiform prominence of the petrous cavernous junction extending into the proximal cavernous artery. The distal cavernous carotid and supraclinoid segments are widely patent. There is a mild focal bulge noted at the level of the right posterior communicating artery. The right middle cerebral artery and the right anterior cerebral artery opacify normally into the capillary and venous phases. ENDOVASCULAR COMPLETE REVASCULARIZATION OF OCCLUDED LEFT INTERNAL CAROTID ARTERY TERMINUS, THE LEFT MIDDLE CEREBRAL ARTERY AND LEFT ANTERIOR CEREBRAL ARTERY. The diagnostic JB 1 catheter in the left common carotid artery was then exchanged over a 0.035 inch 300 cm Rosen exchange  guidewire for a 8 French 55 cm Brite tip neurovascular sheath using biplane roadmap technique and constant fluoroscopic guidance. Good aspiration obtained from the side port of the neurovascular sheath. This was then connected to continuous heparinized saline infusion. Over the [facility] exchange guidewire the distal end of which was at the origin of the left external carotid artery an 8 French 85 cm FlowGate balloon guide catheter which had been prepped with 50% contrast and 50% heparinized saline infusion was then advanced to just the origin of left internal carotid artery. The guidewire was removed. Good aspiration was obtained from the hub of the 8 [place] FlowGate guide catheter. Gentle contrast injection demonstrated no evidence of dissections or of intraluminal filling defects. Over a 0.014 inch Softip Synchro micro guidewire, a 021 Trevo ProVue micro catheter was then advanced to just distal to the origin of the 85 cm 8 [place] FlowGate guide catheter. The micro catheter and micro guidewire were advanced without difficulty to the cranial skull base. However, further advancement of the micro catheter was met with herniation of the entire platform into the aortic arch. The entire system was then removed and replaced with a 8 French 80 cm Arrow neurovascular sheath which was positioned over an exchange micro guidewire just proximal to the origin of the left internal carotid artery. Over the [employer] guidewire, the 8 [place] 80 cm Arrow neurovascular sheath, the 8 French 85 cm FlowGate guide catheter was then advanced and positioned just proximal  to the left internal carotid artery origin. The guidewire was removed. Good aspiration obtained from the hub of the 8 [place] FlowGate guide catheter. Gentle contrast injection demonstrated no evidence of spasms, dissections or of intraluminal filling defects. There was no change in the ascent of contrast to the cranial skull base with complete occlusion of the left  internal carotid artery terminus. Over a 0.014 inch Softip Synchro micro guidewire which had a J-tip configuration, the combination of an intermediary 6 French 132 cm Catalyst guide catheter inside of which was a Trevo ProVue micro catheter, the combination was advanced without difficulty to the supraclinoid left ICA. The micro guidewire was then gently advanced through the occluded left middle cerebral artery into the M2 M3 regions of the inferior division followed by the micro catheter and also followed by the 6 French 32 cm Catalyst guide catheter into the proximal left M1 segment. The micro guidewire was then removed. Aspiration was obtained from the hub of the micro catheter. Approximately 3.6 mg of arterial Integrilin were infused 1.8 mg through the micro catheter distally, and 1.8 mg through the 6 [place] Catalyst guide catheter. At this time, a 4 mm x 40 mm Solitaire FR retrieval device which had been prepped and purged with heparinized saline infusion was advanced to the distal end of the micro catheter in a coaxial manner and with constant heparinized saline infusion. The O rings on delivery micro catheter were then loosened. With slight forward gentle traction with the right hand on the delivery micro guidewire, with the left hand the delivery micro catheter was retrieved until it had been deployed. At this time, the 6 [place] Catalyst 132 cm guide catheter was advanced to just inside the proximal portion of the retrieval device. The occlusion balloon at the origin of the left internal carotid artery was then expanded. Also a 20 mL syringe was hooked to the hub of the 6 [place] Catalyst guide catheter and a constant aspiration was then applied. Thereon after, the combination of the retrieval device, with the micro catheter and the 6 [place] guide catheter were then gently retrieved with constant fluoroscopic guidance and removed. Aspiration was continued with a 60 mL syringe at the hub of the 8 [place]  FlowGate guide catheter which was then gently deflated. Free back bleed at the hub of the 8 [place] FlowGate guide catheter was achieved. The aspirate contained multiple focal chunks of grayish bloody thrombus. Similar thrombus was also noted within the 8 [place] Catalyst guide catheter. Clots were also noted in the interstices of the retrieval device. After having obtained free aspiration of blood at the hub of the 8 [place] FlowGate guide catheter, control arteriogram performed through the [facility] guide catheter in the left internal carotid artery demonstrated brisk antegrade flow to the cranial skull base and into the left middle cerebral artery and left anterior cerebral artery distributions to the distal distributions. No angiographic filling defects or occlusions were seen. There was no evidence of spasm either. No mass-effect or midline shift was noted of the major vessels intracranially. The patient's hemodynamic and neurologic status remained stable. A TICI 3 reperfusion had been obtained. The 8 [place] FlowGate guide catheter and the 8 French 80 cm Arrow neurovascular sheath were then retrieved into the abdominal aorta and exchanged over a J-tiip guidewire for a 9 [place] Pinnacle sheath. This in turn was then connected to continuous heparinized saline infusion. The patient's distal pulses remained unchanged Dopplerable early compared to prior to the procedure. The right groin  appeared soft as did the left groin at the site of the applied sheath. The patient was transported to the CT scanner for postprocedural CT scan of brain. IMPRESSION: Status post endovascular complete revascularization of occluded left internal carotid artery terminus, the left middle cerebral artery and left anterior cerebral artery with 1 pass with the Solitaire 4 mm x 40 mm retrieval device in combination with aspiration of the hub of the intermediary catheter and proximal flow arrest, achieving a TICI 3 reperfusion as described above.  Electronically Signed   By: Luanne Bras M.D.   On: 04/10/2016 13:15   Ct Head Code Stroke Wo Contrast`  Result Date: 04/09/2016 CLINICAL DATA:  Code stroke for right-sided weakness and loss of speech. EXAM: CT HEAD WITHOUT CONTRAST TECHNIQUE: Contiguous axial images were obtained from the base of the skull through the vertex without intravenous contrast. COMPARISON:  None. FINDINGS: Brain: No evidence of acute infarction, hemorrhage, hydrocephalus, extra-axial collection or mass lesion/mass effect. Vascular: Atherosclerotic calcification.  No hyperdense vessel. Skull: Negative Sinuses/Orbits: Mild chronic sinusitis and left mastoiditis. Other: Results were called by telephone at the time of interpretation on 04/09/2016 at 6:41 pm to Dr. Lawana Pai, who verbally acknowledged these results. ASPECTS [facility] Stroke Program Early CT Score) - Ganglionic level infarction (caudate, lentiform nuclei, internal capsule, insula, M1-M3 cortex): 7 - Supraganglionic infarction (M4-M6 cortex): 3 Total score (0-10 with 10 being normal): 10 IMPRESSION: No acute finding. ASPECTS is 10. Electronically Signed   By: Monte Fantasia M.D.   On: 04/09/2016 18:43   Ir Angio Intra Extracran Sel Com Carotid Innominate Uni R Mod Sed  Result Date: 04/13/2016 CLINICAL DATA:  Right-sided hemiplegia, left gaze preference, confusion, global aphasia. EXAM: IR PERCUTANEOUS ART THORMBECTOMY/INFUSION INTRACRANIAL INCLUDE DIAG ANGIO; IR ANGIO INTRA EXTRACRAN SEL COM CAROTID INNOMINATE UNI RIGHT MOD SED PROCEDURE: Following a full explanation of the procedure along with the potential associated complications, an informed witnessed consent was obtained. Risks of intracranial hemorrhage of 10-15%, worsening neurological deficit, death, and the inability to revascularize were all reviewed in detail with the patient's daughter. Informed consent was obtained. The patient was then put under general anesthesia by the Department of Anesthesiology at  [facility]. The right groin was prepped and draped in the usual sterile fashion. Thereafter using modified Seldinger technique, transfemoral access into the right common femoral artery was obtained without difficulty. Over a 0.035 inch guidewire a 5 French Pinnacle sheath was inserted. Through this, and also over a 0.035 inch guidewire a 5 [place] JB 1 catheter was advanced to the aortic arch region and selectively positioned in the left common carotid artery and later in the right common carotid artery. There were no acute complications. The patient tolerated the procedure well. Contrast: Isovue 300 approximately 70 mL. Anesthesia/Sedation:  General anesthesia. Medications: As per general anesthesia. FINDINGS: The right common carotid arteriogram demonstrates moderate to severe tortuosity of the left common carotid artery in its proximal portion with acute angular takeoff towards the aortic arch. Slow flow was seen ascending to the left internal carotid artery. There was no angiographic visualization of the left external carotid artery consistent with occlusion. The left internal carotid artery at the bulb demonstrated no occlusions with wide patency. 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 The right common carotid arteriogram demonstrates the right external carotid artery and its major branches to be widely patent. The  right internal carotid at the bulb and just distally appears widely patent. Focal areas of focal outpouching are seen to emanate from the posterior wall of the right internal carotid artery in the proximal one-third. Distal to this there is a double U shaped tortuosity of the mid left internal carotid artery. Distal to this the left internal carotid artery is seen to opacify normally to the cranial skull base. There is mild fusiform prominence of the petrous  cavernous junction extending into the proximal cavernous artery. The distal cavernous carotid and supraclinoid segments are widely patent. There is a mild focal bulge noted at the level of the right posterior communicating artery. The right middle cerebral artery and the right anterior cerebral artery opacify normally into the capillary and venous phases. ENDOVASCULAR COMPLETE REVASCULARIZATION OF OCCLUDED LEFT INTERNAL CAROTID ARTERY TERMINUS, THE LEFT MIDDLE CEREBRAL ARTERY AND LEFT ANTERIOR CEREBRAL ARTERY. The diagnostic JB 1 catheter in the left common carotid artery was then exchanged over a 0.035 inch 300 cm Rosen exchange guidewire for a 8 French 55 cm Brite tip neurovascular sheath using biplane roadmap technique and constant fluoroscopic guidance. Good aspiration obtained from the side port of the neurovascular sheath. This was then connected to continuous heparinized saline infusion. Over the [facility] exchange guidewire the distal end of which was at the origin of the left external carotid artery an 8 French 85 cm FlowGate balloon guide catheter which had been prepped with 50% contrast and 50% heparinized saline infusion was then advanced to just the origin of left internal carotid artery. The guidewire was removed. Good aspiration was obtained from the hub of the 8 [place] FlowGate guide catheter. Gentle contrast injection demonstrated no evidence of dissections or of intraluminal filling defects. Over a 0.014 inch Softip Synchro micro guidewire, a 021 Trevo ProVue micro catheter was then advanced to just distal to the origin of the 85 cm 8 [place] FlowGate guide catheter. The micro catheter and micro guidewire were advanced without difficulty to the cranial skull base. However, further advancement of the micro catheter was met with herniation of the entire platform into the aortic arch. The entire system was then removed and replaced with a 8 French 80 cm Arrow neurovascular sheath which was positioned over  an exchange micro guidewire just proximal to the origin of the left internal carotid artery. Over the [employer] guidewire, the 8 [place] 80 cm Arrow neurovascular sheath, the 8 French 85 cm FlowGate guide catheter was then advanced and positioned just proximal to the left internal carotid artery origin. The guidewire was removed. Good aspiration obtained from the hub of the 8 [place] FlowGate guide catheter. Gentle contrast injection demonstrated no evidence of spasms, dissections or of intraluminal filling defects. 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 Over a 0.014 inch Softip Synchro micro guidewire which had a J-tip configuration, the combination of an intermediary 6 French 132 cm Catalyst guide catheter inside of which was a Trevo ProVue micro catheter, the combination was advanced without difficulty to the supraclinoid left ICA. The micro guidewire was then gently advanced through the occluded left middle cerebral artery into the M2 M3 regions of the inferior division followed by the micro catheter and also followed by the 6 French 32 cm Catalyst guide catheter into the proximal left M1 segment. The micro guidewire was then removed. Aspiration was obtained from the hub of the micro catheter. Approximately 3.6 mg  of arterial Integrilin were infused 1.8 mg through the micro catheter distally, and 1.8 mg through the 6 [place] Catalyst guide catheter. At this time, a 4 mm x 40 mm Solitaire FR retrieval device which had been prepped and purged with heparinized saline infusion was advanced to the distal end of the micro catheter in a coaxial manner and with constant heparinized saline infusion. The O rings on delivery micro catheter were then loosened. With slight forward gentle traction with the right hand on the delivery micro guidewire, with the left hand the delivery micro catheter was retrieved until it had  been deployed. At this time, the 6 [place] Catalyst 132 cm guide catheter was advanced to just inside the proximal portion of the retrieval device. The occlusion balloon at the origin of the left internal carotid artery was then expanded. Also a 20 mL syringe was hooked to the hub of the 6 [place] Catalyst guide catheter and a constant aspiration was then applied. Thereon after, the combination of the retrieval device, with the micro catheter and the 6 [place] guide catheter were then gently retrieved with constant fluoroscopic guidance and removed. Aspiration was continued with a 60 mL syringe at the hub of the 8 [place] FlowGate guide catheter which was then gently deflated. Free back bleed at the hub of the 8 [place] FlowGate guide catheter was achieved. The aspirate contained multiple focal chunks of grayish bloody thrombus. Similar thrombus was also noted within the 8 [place] Catalyst guide catheter. Clots were also noted in the interstices of the retrieval device. After having obtained free aspiration of blood at the hub of the 8 [place] FlowGate guide catheter, control arteriogram performed through the [facility] guide catheter in the left internal carotid artery demonstrated brisk antegrade flow to the cranial skull base and into the left middle cerebral artery and left anterior cerebral artery distributions to the distal distributions. No angiographic filling defects or occlusions were seen. There was no evidence of spasm either. No mass-effect or midline shift was noted of the major vessels intracranially. The patient's hemodynamic and neurologic status remained stable. A TICI 3 reperfusion had been obtained. The 8 [place] FlowGate guide catheter and the 8 French 80 cm Arrow neurovascular sheath were then retrieved into the abdominal aorta and exchanged over a J-tiip guidewire for a 9 [place] Pinnacle sheath. This in turn was then connected to continuous heparinized saline infusion. The patient's distal pulses  remained unchanged Dopplerable early compared to prior to the procedure. The right groin appeared soft as did the left groin at the site of the applied sheath. The patient was transported to the CT scanner for postprocedural CT scan of brain. IMPRESSION: Status post endovascular complete revascularization of occluded left internal carotid artery terminus, the left middle cerebral artery and left anterior cerebral artery with 1 pass with the Solitaire 4 mm x 40 mm retrieval device in combination with aspiration of the hub of the intermediary catheter and proximal flow arrest, achieving a TICI 3 reperfusion as described above. Electronically Signed   By: Luanne Bras M.D.   On: 04/10/2016 13:15    Jamon Hayhurst, DO  [facility] Pager [phone]  If 7PM-7AM, please contact night-coverage [email or web address] Password [email or web address] 04/26/2016, 4:09 PM   LOS: 11 days

## 2016-04-26 NOTE — Clinical Social Work Note (Signed)
Clinical Social Worker continuing to follow patient and family for support and discharge planning needs.  CSW spoke with patient daughter at bedside who is hopeful for return to CIR, however understands that SNF placement will be the back up option.  Patient daughter requested additional fax out to facilities - CSW completed.  No bed chosen at this time.  CSW remains available for support and to facilitate patient discharge needs once medically stable.  Catherine Day, [place] [phone]

## 2016-04-27 LAB — BASIC METABOLIC PANEL
ANION GAP: 12 (ref 5–15)
BUN: 41 mg/dL — ABNORMAL HIGH (ref 6–20)
CHLORIDE: 95 mmol/L — AB (ref 101–111)
CO2: 32 mmol/L (ref 22–32)
Calcium: 8.7 mg/dL — ABNORMAL LOW (ref 8.9–10.3)
Creatinine, Ser: 0.75 mg/dL (ref 0.44–1.00)
GFR calc Af Amer: >60 mL/min (ref >60)
GLUCOSE: 125 mg/dL — AB (ref 65–99)
POTASSIUM: 3.7 mmol/L (ref 3.5–5.1)
Sodium: 139 mmol/L (ref 135–145)

## 2016-04-27 MED ORDER — ATORVASTATIN CALCIUM 40 MG PO TABS: 40.0000 mg | ORAL_TABLET | Freq: Every day | ORAL | 0 refills | Status: AC

## 2016-04-27 MED ORDER — FUROSEMIDE 40 MG PO TABS: 40.0000 mg | ORAL_TABLET | Freq: Every day | ORAL | 0 refills | Status: AC

## 2016-04-27 MED ORDER — ATORVASTATIN CALCIUM 40 MG PO TABS: 40.0000 mg | ORAL_TABLET | Freq: Every day | ORAL | Status: DC

## 2016-04-27 MED ORDER — VALSARTAN 40 MG PO TABS: 40.0000 mg | ORAL_TABLET | Freq: Every day | ORAL | 0 refills | Status: AC

## 2016-04-27 MED ORDER — PREDNISONE 50 MG PO TABS: 50.0000 mg | ORAL_TABLET | Freq: Every day | ORAL | 0 refills | Status: DC

## 2016-04-27 MED ORDER — FUROSEMIDE 40 MG PO TABS: 40.0000 mg | ORAL_TABLET | Freq: Every day | ORAL | Status: DC

## 2016-04-27 MED ORDER — FUROSEMIDE 40 MG PO TABS
40.0000 mg | ORAL_TABLET | Freq: Every day | ORAL | Status: DC
Start: 2016-04-28 — End: 2016-04-27

## 2016-04-27 MED ORDER — PREDNISONE 50 MG PO TABS
50.0000 mg | ORAL_TABLET | Freq: Every day | ORAL | Status: DC
  Administered 2016-04-27: 50 mg via ORAL
  Filled 2016-04-27: qty 1

## 2016-04-27 NOTE — Progress Notes (Signed)
Speech Language Pathology Treatment: Dysphagia  Patient Details Name: Catherine ClossRegina Day MRN: [id number] DOB: 05/05/1928 Today's Date: 04/27/2016 Time: 1212-1226 SLP Time Calculation (min) (ACUTE ONLY): 14 min  Assessment / Plan / Recommendation Clinical Impression  SLP visited pt given concerns that pt may not be tolerating diet given ongoing bronchospasm per MD. Pt actually performed better today than last week given that respiratory rate and work of breath was unlabored today. Daughter at bedside also verbalized the impression taht her mother had been tolerating PO well over the weekend since they have really been attentive to her position in bed; daughter says family is present for most meals. SLp offered trials of thin liquids which pt drank quite quickly, resulting in some coughing post swallow. Trials of nectar thick liquids with verbal cues for single sips improved pts tolerance. Though agree with continuing precautions and thickened liquids to reduce risk, pt does appear to be tolerating diet well. Discussed with MD. SLP will follow 1x a week to monitor for improvement/tolerance.    HPI HPI: 82 y.o. right handed female with h/o COPD. Presented 04/10/2016 with sudden onset of right-sided weakness and inability to speak. 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 Pt discharged to CIR 11/28, however, later that night she became dyspneic and diaphoretic. She was given albuterol nebs with some improvement. CXR with RLL infiltrate.      SLP Plan  Continue with current plan of care     Recommendations  Diet recommendations: Dysphagia 3 (mechanical soft);Nectar-thick liquid Liquids provided via: Cup Medication Administration: Whole meds with puree Supervision: Trained caregiver to feed patient;Staff to assist with self feeding;Full supervision/cueing for  compensatory strategies Compensations: Small sips/bites;Slow rate Postural Changes and/or Swallow Maneuvers: Seated upright 90 degrees                Plan: Continue with current plan of care       GO              [facility] Center LLCBonnie Tylin Stradley, MA CCC-SLP [phone]   Catherine Day, Catherine Day Catherine Day 04/27/2016, 1:54 PM

## 2016-04-27 NOTE — Progress Notes (Addendum)
Physical Therapy Treatment Patient Details Name: Catherine ClossRegina Day MRN: [id number] DOB: 12/03/1927 Today's Date: 04/27/2016    History of Present Illness 82 year old female with PMH COPD, HTN, [facility] onset R sided-weakness and aphasia. MRI brain-- scattered acute infarcts in the left hemisphere primarily in the left temporal and occipital lobes, and Lt cerebellar artery territory. tPa was administered, taken to IR for revascularization of occluded Lt MCA, ACA and ICA terminus. She was electively intubated for the procedure; extubated 11/24.  Pt went to rehab and returned the same day with SOB and wheezing. Pt with aspiration pneuomitis    PT Comments    Pt performed mobility with supervison- min assist.  Pt with decreased awareness of deficits and poor safety awareness.  Pt will continue to benefit from CIR in efforts to improve endurance and safety before returning home to [place].     Follow Up Recommendations  CIR     Equipment Recommendations   (TBA in rehab (post acute).)    Recommendations for Other Services       Precautions / Restrictions Precautions Precautions: Fall Precaution Comments: on 2L 02 at baseline, required 3L during session and at rest.   Restrictions Weight Bearing Restrictions: No    Mobility  Bed Mobility Overal bed mobility: Needs Assistance Bed Mobility: Supine to Sit     Supine to sit: Supervision;HOB elevated     General bed mobility comments: Cues for technique and hand placement.  Increased time  Transfers Overall transfer level: Needs assistance Equipment used: Rolling walker (2 wheeled) Transfers: Sit to/from Stand Sit to Stand: Min assist;Min guard         General transfer comment: Cues for hand placement to and from seated surface. Pt remains to try and pull on RW which increases her risk for a fall at home.    Ambulation/Gait Ambulation/Gait assistance: Min assist;Min guard Ambulation Distance (Feet):  60 Feet Assistive device: Rolling walker (2 wheeled) Gait Pattern/deviations: Step-through pattern;Trunk flexed;Decreased stride length Gait velocity: Decreased Gait velocity interpretation: Below normal speed for age/gender General Gait Details: Improvement in respiratory function.  reduced watery breath sounds observed.  O2 sats 93% on 3L during gait training.  Pt remains limited due to poor endurance.  WOB noted post session.     Stairs            Wheelchair Mobility    Modified Rankin (Stroke Patients Only) Modified Rankin (Stroke Patients Only) Pre-Morbid Rankin Score: No symptoms Modified Rankin: Moderately severe disability     Balance     Sitting balance-Leahy Scale: Fair       Standing balance-Leahy Scale: Fair                      Cognition Arousal/Alertness: Awake/alert Behavior During Therapy: WFL for tasks assessed/performed;Impulsive Overall Cognitive Status: Impaired/Different from baseline                      Exercises      General Comments        Pertinent Vitals/Pain Pain Assessment: 0-10 Faces Pain Scale: No hurt    Home Living                      Prior Function            PT Goals (current goals can now be found in the care plan section) Acute Rehab PT Goals Patient Stated Goal: return home  to [place] Potential to Achieve Goals: Good Progress towards PT goals: Progressing toward goals    Frequency    Min 4X/week      PT Plan Current plan remains appropriate    Co-evaluation             End of Session Equipment Utilized During Treatment: Gait belt;Oxygen Activity Tolerance: Patient tolerated treatment well Patient left: in chair;with call bell/phone within reach;with chair alarm set;with family/visitor present     Time: [id number] PT Time Calculation (min) (ACUTE ONLY): 19 min  Charges:  $Gait Training: 8-22 mins                    G Codes:      Florestine Aversimee J Lakrista Scaduto 04/27/2016, 12:49  PM  Joycelyn RuaAimee Ibrahima Holberg, PTA pager [phone]

## 2016-04-27 NOTE — Discharge Summary (Addendum)
Physician Discharge Summary  Catherine Day ZSM:[id number] DOB: 10-28-1927 DOA: 04/15/2016  PCP: No primary care provider on file.  Admit date: 04/15/2016 Discharge date: 04/27/2016  Admitted From: Home Disposition:  SNF  Recommendations for Outpatient Follow-up:  1. Follow up with PCP in 1-2 weeks 2. Please obtain BMP/CBC in one week   Home Health: N/A Equipment/Devices: 2L [place]  Discharge Condition:Stable CODE STATUS:FULL Diet recommendation: Dysphagia 3 with nectar thickened liquids   Brief/Interim Summary: 82 yo right-handed female from [place] w/ a Hxof COPD (on 2 L O2) and HLD who presented on April 10, 2016 with thesudden onset of right-sided weakness and an inability to speak. Patient lives in [place] with her daughter. Independent prior to admission. She had been visiting a son in [place].  CT and MRI showed small volume scattered acute infarctsin theleft hemisphere primarily in the left temporal and occipital lobes. No associated mass effect. Small number of other scattered small bilateral MCA left cerebellar artery territory infarcts. The patient did receive tPA. MRI showed possible occlusion of the left PCA-P3 inferior division. Underwent bilateral common carotid arteriograms followed by complete revascularization of occluded left MCA, left ACA, left ICA terminus. Neurology consulted, maintained on aspirin therapy for CVA prophylaxis. TEE noted no intracardiac source of embolism or shunt. She was transferred to CIR, but while there she suffered worsening hypoxia and on 11/29 required transfer back to the acute hospital for aspiration pneumonitis.   Discharge Diagnoses:  Acute on chronic respiratory failure with hypoxia -multifactorial including aspiration pneumonia, CHF in the setting of underlying COPD with exacerbation -stable on 2 L -continue pulmicort during the hospitalization -flutter valve -continue duonebs q 8hours -normally on 2 L [place] at  home -restart Breo, Spiriva and Xopenex after dc which pt was on prior to admit  Acutetoxic Encephalopathy -04/23/16--pt fellasleep when not engaged directly; lethargic -received Ativan 1 mg IV at 0430 on 04/23/16 for agitation -minimize benzo; haldol 1 mg IV q 6 hrs prn agitation -04/23/16-personally reviwed CXR--R- basilar opacity, small left effusion--pt already on merrem -04/24/16--much more alert, pleasantly confused--back to baseline per daughter -04/23/16 urine culture neg  Acute on Chronic Systolic and diastolicCHF - pt had evidence of fluid overload -Continue IV lasix--increase to 60 mg daily -NEG 8L for the admission -04/13/16 TEE--EF 45-50%, grade 1 DD, normal RV -appears previous dry weight ~170 -discharge weight 163 -held ARB due to soft BP--now improving-->restart lower dose valsartan after d/c  COPD Exacerbation -increase steroids to solumedrol 60 mg bid-->improved clinically -d/c with prednisone taper 50-->40-->30-->20-->10 -continue DuoNeb q 8 -continued pulmicort during the hospitalization  Aspiration pneumonia / pneumonitis - completed7days antibiotics - cleared for D3 diet w/ nectar liquids by SLP  - reconsult speech therapy--continued aspiration--worsen bronchospasm  PositiveESBL Escherichia coliUTI - Finishedmeropenem D#7of 7 on 12/8  Recent CVA/New hearing loss - s/p IV TPA and IR treatments as above  -04/26/16-- repeat MRI brain--no new acute infarct, bilateral mastoid and middle ear effusions; residual small volume blood products in mesial L-temporal lobe and L-internal capsule -04/10/16--LDL 92--start lipitor 40 mg daily  Hypokalemia - Replaced  Lower extremity pain and edema -venous duplex--NEG  Goals of Care -12/7-long discussion with daughter at bedside-->change to DNR -plan for SNF   Discharge Instructions  Discharge Instructions    Diet - low sodium heart healthy    Complete by:  As directed    Increase activity slowly     Complete by:  As directed        Medication List  STOP taking these medications   methylPREDNISolone 4 MG Tbpk tablet Commonly known as:  MEDROL DOSEPAK   valsartan-hydrochlorothiazide 80-12.5 MG tablet Commonly known as:  DIOVAN-HCT     TAKE these medications   atorvastatin 40 MG tablet Commonly known as:  LIPITOR Take 1 tablet (40 mg total) by mouth daily at 6 PM.   BREO ELLIPTA 100-25 MCG/INH Aepb Generic drug:  fluticasone furoate-vilanterol Inhale 1 puff into the lungs daily.   CITRACAL + D PO Take 1 tablet by mouth daily.   denosumab 60 MG/ML Soln injection Commonly known as:  PROLIA Inject 60 mg into the skin every 6 (six) months. Administer in upper arm, thigh, or abdomen Last injection 03/24/16   furosemide 40 MG tablet Commonly known as:  LASIX Take 1 tablet (40 mg total) by mouth daily. Start taking on:  04/28/2016   ipratropium-albuterol 0.5-2.5 (3) MG/3ML Soln Commonly known as:  DUONEB Take 3 mLs by nebulization See admin instructions. Inhale 1 vial by nebulization every morning and may also use 2 more times during the day as needed for shortness of breath or wheezing   levalbuterol 45 MCG/ACT inhaler Commonly known as:  XOPENEX HFA Inhale 1-2 puffs into the lungs every 4 (four) hours as needed for wheezing or shortness of breath.   OXYGEN Inhale 2 L into the lungs continuous.   predniSONE 50 MG tablet Commonly known as:  DELTASONE Take 1 tablet (50 mg total) by mouth daily with breakfast. And decrease by one tablet daily Start taking on:  04/28/2016   tiotropium 18 MCG inhalation capsule Commonly known as:  SPIRIVA Place 18 mcg into inhaler and inhale daily.   tobramycin (PF) 300 MG/5ML nebulizer solution Commonly known as:  TOBI Take 300 mg by nebulization daily.   valsartan 40 MG tablet Commonly known as:  DIOVAN Take 1 tablet (40 mg total) by mouth daily. Start taking on:  04/28/2016       No Known  Allergies  Consultations:  None   Procedures/Studies: Ct Head Wo Contrast  Result Date: 04/09/2016 CLINICAL DATA:  82 y/o F; code stroke post transarterial intervention. EXAM: CT HEAD WITHOUT CONTRAST TECHNIQUE: Contiguous axial images were obtained from the base of the skull through the vertex without intravenous contrast. COMPARISON:  04/09/2016 CT head. FINDINGS: Brain: No large territory acute infarct or intracranial hemorrhage identified. Patchy foci of hypoattenuation in periventricular and subcortical white matter may represent areas of infarction or chronic microvascular ischemic changes and are stable. Mild parenchymal volume loss. No focal mass effect. No extra-axial collection. No effacement of basilar cisterns. Vascular: Persistent opacification from previous contrast administration. Calcific atherosclerosis of carotid siphons. Skull: Normal. Negative for fracture or focal lesion. Sinuses/Orbits: Partial opacification of ethmoid and sphenoid sinuses with aerosolized secretions and partial opacification of mastoid air cells probably related to intubation. Orbits are unremarkable. Other: None. IMPRESSION: No large territory acute infarct or intracranial hemorrhage identified. Patchy foci of hypoattenuation in periventricular and subcortical white matter may represent areas of infarction or chronic microvascular ischemic changes and are stable. If clinically indicated MRI is more sensitive for acute ischemia. Electronically Signed   By: Kristine Garbe M.D.   On: 04/09/2016 22:51   Mr Virgel Paling SA Contrast  Result Date: 04/10/2016 CLINICAL DATA:  82 year old female status post neuro intervention on 04/09/2016 for acute onset right side weakness and inability to speak. Status post Bilateral cerebral angiogram with Revascularization of occluded LT MCA ,Lt ACA and Lt ICA terminus with x1 PASS WITH A  COMBINATION OF solitaire 25m x 40 mm retrieval device with simultaneous aspiration at  the intermediate guide catheter and flow gate guide catheter, and 3.6 mg of superselective intracranial INTEGRELIN. Initial encounter. EXAM: MRI HEAD WITHOUT CONTRAST MRA HEAD WITHOUT CONTRAST TECHNIQUE: Multiplanar, multiecho pulse sequences of the brain and surrounding structures were obtained without intravenous contrast. Angiographic images of the head were obtained using MRA technique without contrast. COMPARISON:  Post tPA head CT 04/09/2016 FINDINGS: MRI HEAD FINDINGS Brain: Multiple small cortically based and white-matter foci of restricted diffusion in the left temporal lobe, best seen on axial diffusion weighted imaging (series 4, image 20). Evidence of petechial hemorrhage in the left mesial temporal lobe which appears to track to the left cauda thalamic groove where diffusion is also heterogeneous. These areas demonstrate mild T2 and FLAIR hyperintensity without mass effect. In addition there are several other widely scattered mostly cortically based foci of restricted diffusion in both MCA territories at over the superior convexities. Similar scattered small infarcts in the left occipital lobe. One or 2 such areas in the left cerebellum. No other intracranial hemorrhage. No intracranial mass effect or ventriculomegaly. Superimposed patchy bilateral cerebral white matter T2 and FLAIR hyperintensity. No chronic cortical encephalomalacia. Right deep gray matter nuclei and brainstem are normal for age. Negative pituitary and cervicomedullary junction. Vascular: Major intracranial vascular flow voids are preserved. Skull and upper cervical spine: Negative. Normal bone marrow signal. Sinuses/Orbits: Normal orbits soft tissues. Trace paranasal sinus mucosal thickening. Mild mastoid effusions. Other: Negative scalp soft tissues. MRA HEAD FINDINGS Mildly motion degraded. Antegrade flow in the posterior circulation with mildly dominant distal right vertebral artery. Normal PICA origins. Patent vertebrobasilar  junction. Patent basilar artery without stenosis. Patent SCA and normal right PCA origins. Fetal type left PCA origin. Right posterior communicating artery diminutive or absent. Absent flow signal in the inferior left PCA P3 division. Otherwise the bilateral PCA branches appear normal. Antegrade flow in both ICA siphons with some generalized ICA dolichoectasia and tortuosity of the distal cervical right ICA. Patent carotid termini. Normal MCA and ACA origins. Generalized ACA dolichoectasia. Proximal ACA branches appear normal. Both MCA bifurcations appear patent. MCA branch detail is degraded by motion. Fairly symmetric appearance of bilateral MCA branch flow signal. IMPRESSION: 1. Small volume of scattered acute infarcts in the left hemisphere primarily in the left temporal and occipital lobes (note fetal type left PCA origin anatomy). 2. Petechial hemorrhage in the mesial left temporal lobe tracking to the cauda thalamic groove. No associated mass effect and no malignant hemorrhagic transformation. 3. A small number of other scattered small infarcts in the bilateral MCA and left cerebellar artery territories might be related to endovascular intervention. 4. Motion degraded intracranial MRA negative for emergent large vessel occlusion or proximal intracranial stenosis. Possible occlusion of the left PCA P3 inferior division. Electronically Signed   By: HGenevie AnnM.D.   On: 04/10/2016 19:16   Mr Brain Wo Contrast  Result Date: 04/26/2016 CLINICAL DATA:  Worsening hearing loss over the past 4-5 days. Stroke last month. EXAM: MRI HEAD WITHOUT CONTRAST TECHNIQUE: Multiplanar, multiecho pulse sequences of the brain and surrounding structures were obtained without intravenous contrast. COMPARISON:  04/10/2016 FINDINGS: Brain: Small volume blood products are again seen in the mesial left temporal lobe extending to the region of inferior basal ganglia/ genu of the left internal capsule. Minimal residual diffusion signal  abnormality is present in these locations related to the blood products. Abnormal trace diffusion signal elsewhere in the left greater than  right cerebral hemispheres and left cerebellum on the prior study has resolved. There is minimal T2/FLAIR hyperintensity in the mesial left temporal lobe/ basal ganglia region and left temporal occipital region corresponding to the previously demonstrated infarcts. There is no evidence of acute infarct, new intracranial hemorrhage, mass, midline shift, or extra-axial fluid collection. Bilateral periventricular white matter T2 hyperintensities are unchanged and nonspecific but compatible with mild chronic small vessel ischemic disease. There is moderate cerebral atrophy. Vascular: Major intracranial vascular flow voids are preserved. Skull and upper cervical spine: Unremarkable bone marrow signal. Sinuses/Orbits: Unremarkable orbits. Minimal posterior right ethmoid air cell mucosal thickening. Large bilateral mastoid effusions, increased from prior. Fluid is now present in the middle ear cavities bilaterally. Other: None. IMPRESSION: 1. Residual small volume blood products in the mesial left temporal lobe and left internal capsule/inferior basal ganglia region associated with subacute infarcts demonstrated on the prior MRI. 2. Resolved diffusion abnormality associated with the small infarcts elsewhere. 3. No acute infarct or new intracranial abnormality. 4. Large bilateral mastoid and middle ear effusions. Electronically Signed   By: Logan Bores M.D.   On: 04/26/2016 19:03   Mr Brain Wo Contrast  Result Date: 04/10/2016 CLINICAL DATA:  82 year old female status post neuro intervention on 04/09/2016 for acute onset right side weakness and inability to speak. Status post Bilateral cerebral angiogram with Revascularization of occluded LT MCA ,Lt ACA and Lt ICA terminus with x1 PASS WITH A COMBINATION OF solitaire 59m x 40 mm retrieval device with simultaneous aspiration at the  intermediate guide catheter and flow gate guide catheter, and 3.6 mg of superselective intracranial INTEGRELIN. Initial encounter. EXAM: MRI HEAD WITHOUT CONTRAST MRA HEAD WITHOUT CONTRAST TECHNIQUE: Multiplanar, multiecho pulse sequences of the brain and surrounding structures were obtained without intravenous contrast. Angiographic images of the head were obtained using MRA technique without contrast. COMPARISON:  Post tPA head CT 04/09/2016 FINDINGS: MRI HEAD FINDINGS Brain: Multiple small cortically based and white-matter foci of restricted diffusion in the left temporal lobe, best seen on axial diffusion weighted imaging (series 4, image 20). Evidence of petechial hemorrhage in the left mesial temporal lobe which appears to track to the left cauda thalamic groove where diffusion is also heterogeneous. These areas demonstrate mild T2 and FLAIR hyperintensity without mass effect. In addition there are several other widely scattered mostly cortically based foci of restricted diffusion in both MCA territories at over the superior convexities. Similar scattered small infarcts in the left occipital lobe. One or 2 such areas in the left cerebellum. No other intracranial hemorrhage. No intracranial mass effect or ventriculomegaly. Superimposed patchy bilateral cerebral white matter T2 and FLAIR hyperintensity. No chronic cortical encephalomalacia. Right deep gray matter nuclei and brainstem are normal for age. Negative pituitary and cervicomedullary junction. Vascular: Major intracranial vascular flow voids are preserved. Skull and upper cervical spine: Negative. Normal bone marrow signal. Sinuses/Orbits: Normal orbits soft tissues. Trace paranasal sinus mucosal thickening. Mild mastoid effusions. Other: Negative scalp soft tissues. MRA HEAD FINDINGS Mildly motion degraded. Antegrade flow in the posterior circulation with mildly dominant distal right vertebral artery. Normal PICA origins. Patent vertebrobasilar  junction. Patent basilar artery without stenosis. Patent SCA and normal right PCA origins. Fetal type left PCA origin. Right posterior communicating artery diminutive or absent. Absent flow signal in the inferior left PCA P3 division. Otherwise the bilateral PCA branches appear normal. Antegrade flow in both ICA siphons with some generalized ICA dolichoectasia and tortuosity of the distal cervical right ICA. Patent carotid termini. Normal MCA and  ACA origins. Generalized ACA dolichoectasia. Proximal ACA branches appear normal. Both MCA bifurcations appear patent. MCA branch detail is degraded by motion. Fairly symmetric appearance of bilateral MCA branch flow signal. IMPRESSION: 1. Small volume of scattered acute infarcts in the left hemisphere primarily in the left temporal and occipital lobes (note fetal type left PCA origin anatomy). 2. Petechial hemorrhage in the mesial left temporal lobe tracking to the cauda thalamic groove. No associated mass effect and no malignant hemorrhagic transformation. 3. A small number of other scattered small infarcts in the bilateral MCA and left cerebellar artery territories might be related to endovascular intervention. 4. Motion degraded intracranial MRA negative for emergent large vessel occlusion or proximal intracranial stenosis. Possible occlusion of the left PCA P3 inferior division. Electronically Signed   By: Genevie Ann M.D.   On: 04/10/2016 19:16   Dg Chest Port 1 View  Result Date: 04/23/2016 CLINICAL DATA:  Aspiration pneumonia EXAM: PORTABLE CHEST 1 VIEW COMPARISON:  04/23/2016 at 12:20 FINDINGS: Mild streaky opacity in the right days. Left lung is clear. Pulmonary vasculature is normal. No effusions. Hilar, mediastinal and cardiac contours are unremarkable and unchanged. IMPRESSION: Streaky right base opacity. No focal confluent consolidation. No significant change in the interim. Electronically Signed   By: Andreas Newport M.D.   On: 04/23/2016 23:41   Dg Chest  Port 1 View  Result Date: 04/23/2016 CLINICAL DATA:  COPD, aspiration pneumonia. EXAM: PORTABLE CHEST 1 VIEW COMPARISON:  Portable chest x-ray of Sep 20, 2015 FINDINGS: The lungs are mildly hyperinflated. The interstitial markings are coarse. Increased density at the right lung base is consistent with the clinically suspected aspiration pneumonia. The heart and pulmonary vascularity are normal. There is calcification in the wall of the tortuous thoracic aorta. There is no pleural effusion. The bony thorax exhibits no acute abnormality. IMPRESSION: Right basilar pneumonia consistent with aspiration. COPD. Thoracic aortic atherosclerosis. When the patient can tolerate the procedure, a PA and lateral chest x-ray would be useful. Electronically Signed   By: Daiveon Markman  [place] M.D.   On: 04/23/2016 12:34   Dg Chest Port 1 View  Result Date: 04/21/2016 CLINICAL DATA:  Initial evaluation for acute respiratory distress, wheezing. History of COPD. EXAM: PORTABLE CHEST 1 VIEW COMPARISON:  Prior radiograph from 04/21/2016. FINDINGS: Cardiac and mediastinal silhouettes are stable in size and contour, and remain within normal limits. Atherosclerotic disease noted within the aortic arch. Lungs are hypoinflated. Changes related COPD noted. Increased right basilar opacity favored to reflect atelectasis/bronchovascular crowding, although recurrent infiltrate not excluded. Left basilar atelectasis noted. Small left pleural effusion. No pulmonary edema or pneumothorax. No acute osseous abnormality. IMPRESSION: 1. Shallow lung inflation with increased right basilar opacity, favored to reflect atelectasis/ bronchovascular crowding, although recurrent infiltrate could be considered in the correct clinical setting. 2. Small left pleural effusion with associated atelectasis. 3. COPD. Electronically Signed   By: Jeannine Boga M.D.   On: 04/21/2016 20:38   Dg Chest Port 1 View  Result Date: 04/21/2016 CLINICAL DATA:  Follow-up  wheezing, COPD. EXAM: PORTABLE CHEST 1 VIEW COMPARISON:  04/20/2016 FINDINGS: There is hyperinflation of the lungs compatible with COPD. Heart and mediastinal contours are within normal limits. No focal opacities or effusions. No acute bony abnormality. IMPRESSION: COPD.  No active disease. Electronically Signed   By: Rolm Baptise M.D.   On: 04/21/2016 08:27   Dg Chest Port 1 View  Result Date: 04/20/2016 CLINICAL DATA:  Pneumonia. EXAM: PORTABLE CHEST 1 VIEW COMPARISON:  04/16/2016. FINDINGS: Mediastinum  and hilar structures are normal. Heart size stable. Low lung volumes with basilar atelectasis. Slight improvement of right base infiltrate. A component of right base bronchiectasis cannot be excluded. Tiny left pleural effusion. No pneumothorax. IMPRESSION: 1. Interim partial clearing of right base infiltrate. A component of bronchiectasis in the right base cannot be excluded. 2. Low lung volumes.  Small left pleural effusion noted. Electronically Signed   By: Marcello Moores  Register   On: 04/20/2016 07:20   Dg Chest Port 1 View  Result Date: 04/16/2016 CLINICAL DATA:  Acute onset of shortness of breath. Initial encounter. EXAM: PORTABLE CHEST 1 VIEW COMPARISON:  Chest radiograph performed 04/14/2016 FINDINGS: Persistent right basilar airspace opacity remains concerning for pneumonia. No definite pleural effusion or pneumothorax is seen. Mild vascular congestion is noted. The cardiomediastinal silhouette is mildly enlarged. No acute osseous abnormalities are identified. IMPRESSION: 1. Persistent right basilar airspace opacity remains concerning for pneumonia. 2. Mild vascular congestion and mild cardiomegaly. Electronically Signed   By: Garald Balding M.D.   On: 04/16/2016 06:26   Dg Chest Port 1 View  Result Date: 04/14/2016 CLINICAL DATA:  Acute onset of shortness of breath and cough. Initial encounter. EXAM: PORTABLE CHEST 1 VIEW COMPARISON:  Chest radiograph performed 04/11/2016 FINDINGS: Right basilar  airspace opacity raises concern for pneumonia. No pleural effusion or pneumothorax is seen. The cardiomediastinal silhouette is borderline normal in size. No acute osseous abnormalities are identified. IMPRESSION: Right basilar pneumonia noted. Electronically Signed   By: Garald Balding M.D.   On: 04/14/2016 21:50   Dg Chest Port 1 View  Result Date: 04/11/2016 CLINICAL DATA:  82 year old female with respiratory failure EXAM: PORTABLE CHEST 1 VIEW COMPARISON:  Prior chest x-ray 04/09/2016 FINDINGS: The patient has been extubated and the nasogastric tube removed. Inspiratory volumes are lower. Nonspecific patchy opacities in the right greater than left base. Stable cardiomegaly. Mild pulmonary vascular congestion without edema. Atherosclerotic, ectatic and tortuous thoracic aorta. Right IJ approach central venous catheter remains in unchanged position. The tip of the catheter is at the superior cavoatrial junction. Osseous structures are intact and unremarkable. IMPRESSION: 1. Interval extubation and removal of nasogastric tube. Right IJ central venous catheter remains in stable and satisfactory position. 2. Lower inspiratory volumes with increased bibasilar atelectasis and pulmonary vascular congestion. 3.  Aortic Atherosclerosis (ICD10-170.0) Electronically Signed   By: Jacqulynn Cadet M.D.   On: 04/11/2016 08:12   Dg Chest Port 1 View  Result Date: 04/10/2016 CLINICAL DATA:  Endotracheal tube and orogastric tube placement. Initial encounter. EXAM: PORTABLE CHEST 1 VIEW COMPARISON:  None. FINDINGS: The patient's endotracheal tube is seen ending 2 cm above the carina. A right IJ line is noted ending about the distal SVC. An enteric tube is noted extending below the diaphragm. Mild bibasilar atelectasis is noted. No pleural effusion or pneumothorax is seen. The cardiomediastinal silhouette is borderline normal in size. No acute osseous abnormalities are identified. IMPRESSION: 1. Endotracheal tube seen  ending 2 cm above the carina. 2. Right IJ line noted ending about the distal SVC. 3. Enteric tube noted extending below the diaphragm. 4. Mild bibasilar atelectasis. Electronically Signed   By: Garald Balding M.D.   On: 04/10/2016 00:02   Dg Abd Portable 1v  Result Date: 04/09/2016 CLINICAL DATA:  82 y/o  F; OG tube placement. EXAM: PORTABLE ABDOMEN - 1 VIEW COMPARISON:  None. FINDINGS: Patient is rotated. Normal bowel gas pattern. Enteric tube tip is below the diaphragm probably in the distal stomach. Slight densities projecting over  the kidneys bilaterally may represent stones. Degenerative changes of the spine. IMPRESSION: Enteric tube tip probably in distal stomach. Densities projecting over kidneys may represent nephrolithiasis. Electronically Signed   By: Kristine Garbe M.D.   On: 04/09/2016 23:56   Dg Swallowing Func-speech Pathology  Result Date: 04/17/2016 Objective Swallowing Evaluation: Type of Study: MBS-Modified Barium Swallow Study Patient Details Name: Catherine Day MRN: [id number] Date of Birth: [birth year] Today's Date: 04/17/2016 Time: SLP Start Time (ACUTE ONLY): 1045-SLP Stop Time (ACUTE ONLY): 1115 SLP Time Calculation (min) (ACUTE ONLY): 30 min Past Medical History: Past Medical History: Diagnosis Date . COPD (chronic obstructive pulmonary disease) ([place])  Past Surgical History: Past Surgical History: Procedure Laterality Date . IR GENERIC HISTORICAL  04/09/2016  IR ANGIO INTRA EXTRACRAN SEL COM CAROTID INNOMINATE UNI R MOD SED 04/09/2016 Luanne Bras, MD [facility] . IR GENERIC HISTORICAL  04/09/2016  IR PERCUTANEOUS ART THROMBECTOMY/INFUSION INTRACRANIAL INC DIAG ANGIO 04/09/2016 Luanne Bras, MD [facility] . PELVIC FRACTURE SURGERY   . RADIOLOGY WITH ANESTHESIA N/A 04/09/2016  Procedure: RADIOLOGY WITH ANESTHESIA;  Surgeon: Luanne Bras, MD;  Location: [facility];  Service: Radiology;  Laterality: N/A; . TEE WITHOUT CARDIOVERSION N/A 04/13/2016  Procedure:  TRANSESOPHAGEAL ECHOCARDIOGRAM (TEE);  Surgeon: Sanda Klein, MD;  Location: [facility] ENDOSCOPY;  Service: Cardiovascular;  Laterality: N/A; HPI: 82 y.o. right handed female with h/o COPD. Presented 04/10/2016 with sudden onset of right-sided weakness and inability to speak. 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 Pt discharged to [facility] 11/28, however, later that night she became dyspneic and diaphoretic. She was given albuterol nebs with some improvement. CXR with RLL infiltrate. Subjective: pt awake, pleasant Assessment / Plan / Recommendation CHL IP CLINICAL IMPRESSIONS 04/17/2016 Therapy Diagnosis Mild oral phase dysphagia;Mild pharyngeal phase dysphagia Clinical Impression Pt's swallow function demonstrates mild improvements since [facility] 11/26.  She presents with piecemeal oral bolusing; persisting delay in initiation (valleculae and pyriforms) -however, this is not necessarily considered disordered in the elderly population.  There may have been questionable, high penetration of liquids, but it was difficult to discern.  No aspiration. Pyriform sinuses appear to be asymmetric and there is mild residue remaining post-swallow. Recommend advancing diet to dysphagia 3, nectar-thick liquids for now.  Will follow for functional toleration/safety.   Impact on safety and function Mild aspiration risk   CHL IP TREATMENT RECOMMENDATION 04/17/2016 Treatment Recommendations Therapy as outlined in treatment plan below   Prognosis 04/17/2016 Prognosis for Safe Diet Advancement Good Barriers to Reach Goals -- Barriers/Prognosis Comment -- CHL IP DIET RECOMMENDATION 04/17/2016 SLP Diet Recommendations Dysphagia 3 (Mech soft) solids;Nectar thick liquid Liquid Administration via Cup Medication Administration Whole meds with puree Compensations Small sips/bites;Slow rate Postural Changes --   CHL IP  OTHER RECOMMENDATIONS 04/17/2016 Recommended Consults -- Oral Care Recommendations Oral care BID Other Recommendations --   CHL IP FOLLOW UP RECOMMENDATIONS 04/17/2016 Follow up Recommendations Inpatient Rehab   CHL IP FREQUENCY AND DURATION 04/17/2016 Speech Therapy Frequency (ACUTE ONLY) min 2x/week Treatment Duration 2 weeks      CHL IP ORAL PHASE 04/17/2016 Oral Phase Impaired Oral - Pudding Teaspoon -- Oral - Pudding Cup -- Oral - Honey Teaspoon -- Oral - Honey Cup -- Oral - Nectar Teaspoon -- Oral - Nectar Cup -- Oral - Nectar Straw -- Oral - Thin Teaspoon -- Oral - Thin Cup -- Oral - Thin Straw -- Oral - Puree  Piecemeal swallowing Oral - Mech Soft Piecemeal swallowing Oral - Regular -- Oral - Multi-Consistency -- Oral - Pill -- Oral Phase - Comment --  CHL IP PHARYNGEAL PHASE 04/17/2016 Pharyngeal Phase Impaired Pharyngeal- Pudding Teaspoon -- Pharyngeal -- Pharyngeal- Pudding Cup -- Pharyngeal -- Pharyngeal- Honey Teaspoon -- Pharyngeal -- Pharyngeal- Honey Cup NT Pharyngeal -- Pharyngeal- Nectar Teaspoon -- Pharyngeal -- Pharyngeal- Nectar Cup Pharyngeal residue - pyriform;Delayed swallow initiation-pyriform sinuses Pharyngeal Material enters airway, remains ABOVE vocal cords then ejected out Pharyngeal- Nectar Straw -- Pharyngeal -- Pharyngeal- Thin Teaspoon -- Pharyngeal -- Pharyngeal- Thin Cup Delayed swallow initiation-pyriform sinuses;Pharyngeal residue - pyriform Pharyngeal Material enters airway, remains ABOVE vocal cords then ejected out Pharyngeal- Thin Straw -- Pharyngeal -- Pharyngeal- Puree Delayed swallow initiation-vallecula;Pharyngeal residue - pyriform Pharyngeal -- Pharyngeal- Mechanical Soft Delayed swallow initiation-vallecula;Pharyngeal residue - pyriform Pharyngeal -- Pharyngeal- Regular -- Pharyngeal -- Pharyngeal- Multi-consistency -- Pharyngeal -- Pharyngeal- Pill -- Pharyngeal -- Pharyngeal Comment --  CHL IP CERVICAL ESOPHAGEAL PHASE 04/12/2016 Cervical Esophageal Phase WFL Pudding  Teaspoon -- Pudding Cup -- Honey Teaspoon -- Honey Cup -- Nectar Teaspoon -- Nectar Cup -- Nectar Straw -- Thin Teaspoon -- Thin Cup -- Thin Straw -- Puree -- Mechanical Soft -- Regular -- Multi-consistency -- Pill -- Cervical Esophageal Comment -- No flowsheet data found. Juan Quam Laurice 04/17/2016, 11:50 AM              Dg Swallowing Func-speech Pathology  Result Date: 04/12/2016 Objective Swallowing Evaluation: Type of Study: MBS-Modified Barium Swallow Study Patient Details Name: Catherine Day MRN: [id number] Date of Birth: 10-May-1928 Today's Date: 04/12/2016 Time: SLP Start Time (ACUTE ONLY): 1116-SLP Stop Time (ACUTE ONLY): 1131 SLP Time Calculation (min) (ACUTE ONLY): 15 min Past Medical History: Past Medical History: Diagnosis Date . COPD (chronic obstructive pulmonary disease) ([place])  Past Surgical History: Past Surgical History: Procedure Laterality Date . PELVIC FRACTURE SURGERY   . RADIOLOGY WITH ANESTHESIA N/A 04/09/2016  Procedure: RADIOLOGY WITH ANESTHESIA;  Surgeon: Luanne Bras, MD;  Location: [place];  Service: Radiology;  Laterality: N/A; HPI: Ms Passon is an 82 yo female with h/o COPD admitted with mental status change, right sided weakness, hemianopsia, left gaze preference,  Pt required intubation and was extubated yesterday.  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  Swallow and speech eval ordered Subjective: pt awake, pleasant Assessment / Plan / Recommendation CHL IP CLINICAL IMPRESSIONS 04/12/2016 Therapy Diagnosis Moderate pharyngeal phase dysphagia Clinical Impression Pt has a moderate pharyngeal dysphagia due in large part to delayed swallow initiation and decreased sensation of airway penetration. Liquids spill to the pyriform sinuses, resulting in deep, silent penetration of thin and nectar thick liquids. Although this does not occur with every bolus, it is silent and unable to be  completely cleared with cued coughing, making her at a higher risk for an aspiration-related infection. Her strength is adequate with no significant residuals remaining post-swallow. Recommend Dys 3 diet and honey thick liquids. SLP to f/u for tolerance and readiness to advance. Impact on safety and function Moderate aspiration risk   CHL IP TREATMENT RECOMMENDATION 04/12/2016 Treatment Recommendations Therapy as outlined in treatment plan below   Prognosis 04/12/2016 Prognosis for Safe Diet Advancement Good Barriers to Reach Goals -- Barriers/Prognosis Comment -- CHL IP DIET RECOMMENDATION 04/12/2016 SLP Diet Recommendations Dysphagia 3 (Mech soft) solids;Honey thick liquids Liquid Administration via Cup;No straw Medication Administration Whole meds with puree Compensations Slow rate;Small sips/bites Postural  Changes Remain semi-upright after after feeds/meals (Comment);Seated upright at 90 degrees   CHL IP OTHER RECOMMENDATIONS 04/12/2016 Recommended Consults -- Oral Care Recommendations Oral care BID Other Recommendations Order thickener from pharmacy;Prohibited food (jello, ice cream, thin soups);Remove water pitcher   CHL IP FOLLOW UP RECOMMENDATIONS 04/12/2016 Follow up Recommendations Inpatient Rehab   CHL IP FREQUENCY AND DURATION 04/12/2016 Speech Therapy Frequency (ACUTE ONLY) min 2x/week Treatment Duration 2 weeks      CHL IP ORAL PHASE 04/12/2016 Oral Phase WFL Oral - Pudding Teaspoon -- Oral - Pudding Cup -- Oral - Honey Teaspoon -- Oral - Honey Cup -- Oral - Nectar Teaspoon -- Oral - Nectar Cup -- Oral - Nectar Straw -- Oral - Thin Teaspoon -- Oral - Thin Cup -- Oral - Thin Straw -- Oral - Puree -- Oral - Mech Soft -- Oral - Regular -- Oral - Multi-Consistency -- Oral - Pill -- Oral Phase - Comment --  CHL IP PHARYNGEAL PHASE 04/12/2016 Pharyngeal Phase Impaired Pharyngeal- Pudding Teaspoon -- Pharyngeal -- Pharyngeal- Pudding Cup -- Pharyngeal -- Pharyngeal- Honey Teaspoon -- Pharyngeal -- Pharyngeal-  Honey Cup Delayed swallow initiation-pyriform sinuses Pharyngeal -- Pharyngeal- Nectar Teaspoon -- Pharyngeal -- Pharyngeal- Nectar Cup Delayed swallow initiation-pyriform sinuses;Penetration/Aspiration before swallow Pharyngeal Material enters airway, remains ABOVE vocal cords and not ejected out Pharyngeal- Nectar Straw -- Pharyngeal -- Pharyngeal- Thin Teaspoon -- Pharyngeal -- Pharyngeal- Thin Cup Delayed swallow initiation-pyriform sinuses;Penetration/Aspiration before swallow Pharyngeal Material enters airway, CONTACTS cords and not ejected out Pharyngeal- Thin Straw -- Pharyngeal -- Pharyngeal- Puree Delayed swallow initiation-vallecula Pharyngeal -- Pharyngeal- Mechanical Soft Delayed swallow initiation-vallecula Pharyngeal -- Pharyngeal- Regular -- Pharyngeal -- Pharyngeal- Multi-consistency -- Pharyngeal -- Pharyngeal- Pill -- Pharyngeal -- Pharyngeal Comment --  CHL IP CERVICAL ESOPHAGEAL PHASE 04/12/2016 Cervical Esophageal Phase WFL Pudding Teaspoon -- Pudding Cup -- Honey Teaspoon -- Honey Cup -- Nectar Teaspoon -- Nectar Cup -- Nectar Straw -- Thin Teaspoon -- Thin Cup -- Thin Straw -- Puree -- Mechanical Soft -- Regular -- Multi-consistency -- Pill -- Cervical Esophageal Comment -- No flowsheet data found. Germain Osgood 04/12/2016, 1:05 PM  Germain Osgood, M.A. CCC-SLP [phone]             Ir Percutaneous Art Thrombectomy/infusion Intracranial Inc Diag Angio  Result Date: 04/13/2016 CLINICAL DATA:  Right-sided hemiplegia, left gaze preference, confusion, global aphasia. EXAM: IR PERCUTANEOUS ART THORMBECTOMY/INFUSION INTRACRANIAL INCLUDE DIAG ANGIO; IR ANGIO INTRA EXTRACRAN SEL COM CAROTID INNOMINATE UNI RIGHT MOD SED PROCEDURE: Following a full explanation of the procedure along with the potential associated complications, an informed witnessed consent was obtained. Risks of intracranial hemorrhage of 10-15%, worsening neurological deficit, death, and the inability to revascularize  were all reviewed in detail with the patient's daughter. Informed consent was obtained. The patient was then put under general anesthesia by the Department of Anesthesiology at [facility]. The right groin was prepped and draped in the usual sterile fashion. Thereafter using modified Seldinger technique, transfemoral access into the right common femoral artery was obtained without difficulty. Over a 0.035 inch guidewire a 5 French Pinnacle sheath was inserted. Through this, and also over a 0.035 inch guidewire a 5 [place] JB 1 catheter was advanced to the aortic arch region and selectively positioned in the left common carotid artery and later in the right common carotid artery. There were no acute complications. The patient tolerated the procedure well. Contrast: Isovue 300 approximately 70 mL. Anesthesia/Sedation:  General anesthesia. Medications: As per general anesthesia. FINDINGS: The right common carotid  arteriogram demonstrates moderate to severe tortuosity of the left common carotid artery in its proximal portion with acute angular takeoff towards the aortic arch. Slow flow was seen ascending to the left internal carotid artery. There was no angiographic visualization of the left external carotid artery consistent with occlusion. The left internal carotid artery at the bulb demonstrated no occlusions with wide patency. 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 The right common carotid arteriogram demonstrates the right external carotid artery and its major branches to be widely patent. The right internal carotid at the bulb and just distally appears widely patent. Focal areas of focal outpouching are seen to emanate from the posterior wall of the right internal carotid artery in the proximal one-third. Distal to this there is a double U shaped tortuosity of the mid left  internal carotid artery. Distal to this the left internal carotid artery is seen to opacify normally to the cranial skull base. There is mild fusiform prominence of the petrous cavernous junction extending into the proximal cavernous artery. The distal cavernous carotid and supraclinoid segments are widely patent. There is a mild focal bulge noted at the level of the right posterior communicating artery. The right middle cerebral artery and the right anterior cerebral artery opacify normally into the capillary and venous phases. ENDOVASCULAR COMPLETE REVASCULARIZATION OF OCCLUDED LEFT INTERNAL CAROTID ARTERY TERMINUS, THE LEFT MIDDLE CEREBRAL ARTERY AND LEFT ANTERIOR CEREBRAL ARTERY. The diagnostic JB 1 catheter in the left common carotid artery was then exchanged over a 0.035 inch 300 cm Rosen exchange guidewire for a 8 French 55 cm Brite tip neurovascular sheath using biplane roadmap technique and constant fluoroscopic guidance. Good aspiration obtained from the side port of the neurovascular sheath. This was then connected to continuous heparinized saline infusion. Over the [facility] exchange guidewire the distal end of which was at the origin of the left external carotid artery an 8 French 85 cm FlowGate balloon guide catheter which had been prepped with 50% contrast and 50% heparinized saline infusion was then advanced to just the origin of left internal carotid artery. The guidewire was removed. Good aspiration was obtained from the hub of the 8 [place] FlowGate guide catheter. Gentle contrast injection demonstrated no evidence of dissections or of intraluminal filling defects. Over a 0.014 inch Softip Synchro micro guidewire, a 021 Trevo ProVue micro catheter was then advanced to just distal to the origin of the 85 cm 8 [place] FlowGate guide catheter. The micro catheter and micro guidewire were advanced without difficulty to the cranial skull base. However, further advancement of the micro catheter was met with  herniation of the entire platform into the aortic arch. The entire system was then removed and replaced with a 8 French 80 cm Arrow neurovascular sheath which was positioned over an exchange micro guidewire just proximal to the origin of the left internal carotid artery. Over the [employer] guidewire, the 8 [place] 80 cm Arrow neurovascular sheath, the 8 French 85 cm FlowGate guide catheter was then advanced and positioned just proximal to the left internal carotid artery origin. The guidewire was removed. Good aspiration obtained from the hub of the 8 [place] FlowGate guide catheter. Gentle contrast injection demonstrated no evidence of spasms, dissections or of intraluminal filling defects. 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 Over a 0.014 inch Softip Synchro micro guidewire which had a J-tip configuration, the combination of an intermediary 6 French 132 cm Catalyst guide catheter inside of which was a Trevo ProVue micro catheter, the combination was advanced without difficulty to the supraclinoid left ICA. The micro guidewire was then gently advanced through the occluded left middle cerebral artery into the M2 M3 regions of the inferior division followed by the micro catheter and also followed by the 6 French 32 cm Catalyst guide catheter into the proximal left M1 segment. The micro guidewire was then removed. Aspiration was obtained from the hub of the micro catheter. Approximately 3.6 mg of arterial Integrilin were infused 1.8 mg through the micro catheter distally, and 1.8 mg through the 6 [place] Catalyst guide catheter. At this time, a 4 mm x 40 mm Solitaire FR retrieval device which had been prepped and purged with heparinized saline infusion was advanced to the distal end of the micro catheter in a coaxial manner and with constant heparinized saline infusion. The O rings on delivery micro catheter were  then loosened. With slight forward gentle traction with the right hand on the delivery micro guidewire, with the left hand the delivery micro catheter was retrieved until it had been deployed. At this time, the 6 [place] Catalyst 132 cm guide catheter was advanced to just inside the proximal portion of the retrieval device. The occlusion balloon at the origin of the left internal carotid artery was then expanded. Also a 20 mL syringe was hooked to the hub of the 6 [place] Catalyst guide catheter and a constant aspiration was then applied. Thereon after, the combination of the retrieval device, with the micro catheter and the 6 [place] guide catheter were then gently retrieved with constant fluoroscopic guidance and removed. Aspiration was continued with a 60 mL syringe at the hub of the 8 [place] FlowGate guide catheter which was then gently deflated. Free back bleed at the hub of the 8 [place] FlowGate guide catheter was achieved. The aspirate contained multiple focal chunks of grayish bloody thrombus. Similar thrombus was also noted within the 8 [place] Catalyst guide catheter. Clots were also noted in the interstices of the retrieval device. After having obtained free aspiration of blood at the hub of the 8 [place] FlowGate guide catheter, control arteriogram performed through the [facility] guide catheter in the left internal carotid artery demonstrated brisk antegrade flow to the cranial skull base and into the left middle cerebral artery and left anterior cerebral artery distributions to the distal distributions. No angiographic filling defects or occlusions were seen. There was no evidence of spasm either. No mass-effect or midline shift was noted of the major vessels intracranially. The patient's hemodynamic and neurologic status remained stable. A TICI 3 reperfusion had been obtained. The 8 [place] FlowGate guide catheter and the 8 French 80 cm Arrow neurovascular sheath were then retrieved into the abdominal aorta  and exchanged over a J-tiip guidewire for a 9 [place] Pinnacle sheath. This in turn was then connected to continuous heparinized saline infusion. The patient's distal pulses remained unchanged Dopplerable early compared to prior to the procedure. The right groin appeared soft as did the left groin at the site of the applied sheath. The patient was transported to the CT scanner for postprocedural CT scan of brain. IMPRESSION: Status post endovascular complete revascularization of occluded left internal carotid artery terminus, the left middle cerebral artery and left anterior cerebral artery with 1 pass with the Solitaire 4 mm x 40  mm retrieval device in combination with aspiration of the hub of the intermediary catheter and proximal flow arrest, achieving a TICI 3 reperfusion as described above. Electronically Signed   By: Luanne Bras M.D.   On: 04/10/2016 13:15   Ct Head Code Stroke Wo Contrast`  Result Date: 04/09/2016 CLINICAL DATA:  Code stroke for right-sided weakness and loss of speech. EXAM: CT HEAD WITHOUT CONTRAST TECHNIQUE: Contiguous axial images were obtained from the base of the skull through the vertex without intravenous contrast. COMPARISON:  None. FINDINGS: Brain: No evidence of acute infarction, hemorrhage, hydrocephalus, extra-axial collection or mass lesion/mass effect. Vascular: Atherosclerotic calcification.  No hyperdense vessel. Skull: Negative Sinuses/Orbits: Mild chronic sinusitis and left mastoiditis. Other: Results were called by telephone at the time of interpretation on 04/09/2016 at 6:41 pm to Dr. Lawana Pai, who verbally acknowledged these results. ASPECTS [facility] Stroke Program Early CT Score) - Ganglionic level infarction (caudate, lentiform nuclei, internal capsule, insula, M1-M3 cortex): 7 - Supraganglionic infarction (M4-M6 cortex): 3 Total score (0-10 with 10 being normal): 10 IMPRESSION: No acute finding. ASPECTS is 10. Electronically Signed   By: Monte Fantasia M.D.    On: 04/09/2016 18:43   Ir Angio Intra Extracran Sel Com Carotid Innominate Uni R Mod Sed  Result Date: 04/13/2016 CLINICAL DATA:  Right-sided hemiplegia, left gaze preference, confusion, global aphasia. EXAM: IR PERCUTANEOUS ART THORMBECTOMY/INFUSION INTRACRANIAL INCLUDE DIAG ANGIO; IR ANGIO INTRA EXTRACRAN SEL COM CAROTID INNOMINATE UNI RIGHT MOD SED PROCEDURE: Following a full explanation of the procedure along with the potential associated complications, an informed witnessed consent was obtained. Risks of intracranial hemorrhage of 10-15%, worsening neurological deficit, death, and the inability to revascularize were all reviewed in detail with the patient's daughter. Informed consent was obtained. The patient was then put under general anesthesia by the Department of Anesthesiology at [facility]. The right groin was prepped and draped in the usual sterile fashion. Thereafter using modified Seldinger technique, transfemoral access into the right common femoral artery was obtained without difficulty. Over a 0.035 inch guidewire a 5 French Pinnacle sheath was inserted. Through this, and also over a 0.035 inch guidewire a 5 [place] JB 1 catheter was advanced to the aortic arch region and selectively positioned in the left common carotid artery and later in the right common carotid artery. There were no acute complications. The patient tolerated the procedure well. Contrast: Isovue 300 approximately 70 mL. Anesthesia/Sedation:  General anesthesia. Medications: As per general anesthesia. FINDINGS: The right common carotid arteriogram demonstrates moderate to severe tortuosity of the left common carotid artery in its proximal portion with acute angular takeoff towards the aortic arch. Slow flow was seen ascending to the left internal carotid artery. There was no angiographic visualization of the left external carotid artery consistent with occlusion. The left internal carotid artery at the bulb  demonstrated no occlusions with wide patency. 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 The right common carotid arteriogram demonstrates the right external carotid artery and its major branches to be widely patent. The right internal carotid at the bulb and just distally appears widely patent. Focal areas of focal outpouching are seen to emanate from the posterior wall of the right internal carotid artery in the proximal one-third. Distal to this there is a double U shaped tortuosity of the mid left internal carotid artery. Distal to this the left internal carotid artery is  seen to opacify normally to the cranial skull base. There is mild fusiform prominence of the petrous cavernous junction extending into the proximal cavernous artery. The distal cavernous carotid and supraclinoid segments are widely patent. There is a mild focal bulge noted at the level of the right posterior communicating artery. The right middle cerebral artery and the right anterior cerebral artery opacify normally into the capillary and venous phases. ENDOVASCULAR COMPLETE REVASCULARIZATION OF OCCLUDED LEFT INTERNAL CAROTID ARTERY TERMINUS, THE LEFT MIDDLE CEREBRAL ARTERY AND LEFT ANTERIOR CEREBRAL ARTERY. The diagnostic JB 1 catheter in the left common carotid artery was then exchanged over a 0.035 inch 300 cm Rosen exchange guidewire for a 8 French 55 cm Brite tip neurovascular sheath using biplane roadmap technique and constant fluoroscopic guidance. Good aspiration obtained from the side port of the neurovascular sheath. This was then connected to continuous heparinized saline infusion. Over the [facility] exchange guidewire the distal end of which was at the origin of the left external carotid artery an 8 French 85 cm FlowGate balloon guide catheter which had been prepped with 50% contrast and 50% heparinized  saline infusion was then advanced to just the origin of left internal carotid artery. The guidewire was removed. Good aspiration was obtained from the hub of the 8 [place] FlowGate guide catheter. Gentle contrast injection demonstrated no evidence of dissections or of intraluminal filling defects. Over a 0.014 inch Softip Synchro micro guidewire, a 021 Trevo ProVue micro catheter was then advanced to just distal to the origin of the 85 cm 8 [place] FlowGate guide catheter. The micro catheter and micro guidewire were advanced without difficulty to the cranial skull base. However, further advancement of the micro catheter was met with herniation of the entire platform into the aortic arch. The entire system was then removed and replaced with a 8 French 80 cm Arrow neurovascular sheath which was positioned over an exchange micro guidewire just proximal to the origin of the left internal carotid artery. Over the [employer] guidewire, the 8 [place] 80 cm Arrow neurovascular sheath, the 8 French 85 cm FlowGate guide catheter was then advanced and positioned just proximal to the left internal carotid artery origin. The guidewire was removed. Good aspiration obtained from the hub of the 8 [place] FlowGate guide catheter. Gentle contrast injection demonstrated no evidence of spasms, dissections or of intraluminal filling defects. 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 Over a 0.014 inch Softip Synchro micro guidewire which had a J-tip configuration, the combination of an intermediary 6 French 132 cm Catalyst guide catheter inside of which was a Trevo ProVue micro catheter, the combination was advanced without difficulty to the supraclinoid left ICA. The micro guidewire was then gently advanced through the occluded left middle cerebral artery into the M2 M3 regions of the inferior division followed by the micro catheter and  also followed by the 6 French 32 cm Catalyst guide catheter into the proximal left M1 segment. The micro guidewire was then removed. Aspiration was obtained from the hub of the micro catheter. Approximately 3.6 mg of arterial Integrilin were infused 1.8 mg through the micro catheter distally, and 1.8 mg through the 6 [place] Catalyst guide catheter. At this time, a 4 mm x 40 mm Solitaire FR retrieval device which had been prepped and purged with heparinized saline infusion was advanced to the distal end of the micro catheter in a coaxial manner and with  constant heparinized saline infusion. The O rings on delivery micro catheter were then loosened. With slight forward gentle traction with the right hand on the delivery micro guidewire, with the left hand the delivery micro catheter was retrieved until it had been deployed. At this time, the 6 [place] Catalyst 132 cm guide catheter was advanced to just inside the proximal portion of the retrieval device. The occlusion balloon at the origin of the left internal carotid artery was then expanded. Also a 20 mL syringe was hooked to the hub of the 6 [place] Catalyst guide catheter and a constant aspiration was then applied. Thereon after, the combination of the retrieval device, with the micro catheter and the 6 [place] guide catheter were then gently retrieved with constant fluoroscopic guidance and removed. Aspiration was continued with a 60 mL syringe at the hub of the 8 [place] FlowGate guide catheter which was then gently deflated. Free back bleed at the hub of the 8 [place] FlowGate guide catheter was achieved. The aspirate contained multiple focal chunks of grayish bloody thrombus. Similar thrombus was also noted within the 8 [place] Catalyst guide catheter. Clots were also noted in the interstices of the retrieval device. After having obtained free aspiration of blood at the hub of the 8 [place] FlowGate guide catheter, control arteriogram performed through the [facility]  guide catheter in the left internal carotid artery demonstrated brisk antegrade flow to the cranial skull base and into the left middle cerebral artery and left anterior cerebral artery distributions to the distal distributions. No angiographic filling defects or occlusions were seen. There was no evidence of spasm either. No mass-effect or midline shift was noted of the major vessels intracranially. The patient's hemodynamic and neurologic status remained stable. A TICI 3 reperfusion had been obtained. The 8 [place] FlowGate guide catheter and the 8 French 80 cm Arrow neurovascular sheath were then retrieved into the abdominal aorta and exchanged over a J-tiip guidewire for a 9 [place] Pinnacle sheath. This in turn was then connected to continuous heparinized saline infusion. The patient's distal pulses remained unchanged Dopplerable early compared to prior to the procedure. The right groin appeared soft as did the left groin at the site of the applied sheath. The patient was transported to the CT scanner for postprocedural CT scan of brain. IMPRESSION: Status post endovascular complete revascularization of occluded left internal carotid artery terminus, the left middle cerebral artery and left anterior cerebral artery with 1 pass with the Solitaire 4 mm x 40 mm retrieval device in combination with aspiration of the hub of the intermediary catheter and proximal flow arrest, achieving a TICI 3 reperfusion as described above. Electronically Signed   By: Luanne Bras M.D.   On: 04/10/2016 13:15        Discharge Exam: Vitals:   04/26/16 2051 04/27/16 0628  BP:  (!) 149/60  Pulse: 78 73  Resp: 18 18  Temp:  97.5 F (36.4 C)   Vitals:   04/26/16 2051 04/27/16 0500 04/27/16 0628 04/27/16 0843  BP:   (!) 149/60   Pulse: 78  73   Resp: 18  18   Temp:   97.5 F (36.4 C)   TempSrc:   Oral   SpO2:   100% 96%  Weight:  74.1 kg (163 lb 5.8 oz)    Height:        General: Pt is alert, awake, not in  acute distress Cardiovascular: RRR, S1/S2 +, no rubs, no gallops Respiratory: bibasilar rales, minimal basilar wheeze  Abdominal: Soft, NT, ND, bowel sounds + Extremities: trace LE edema, no cyanosis   The results of significant diagnostics from this hospitalization (including imaging, microbiology, ancillary and laboratory) are listed below for reference.    Significant Diagnostic Studies: Ct Head Wo Contrast  Result Date: 04/09/2016 CLINICAL DATA:  82 y/o F; code stroke post transarterial intervention. EXAM: CT HEAD WITHOUT CONTRAST TECHNIQUE: Contiguous axial images were obtained from the base of the skull through the vertex without intravenous contrast. COMPARISON:  04/09/2016 CT head. FINDINGS: Brain: No large territory acute infarct or intracranial hemorrhage identified. Patchy foci of hypoattenuation in periventricular and subcortical white matter may represent areas of infarction or chronic microvascular ischemic changes and are stable. Mild parenchymal volume loss. No focal mass effect. No extra-axial collection. No effacement of basilar cisterns. Vascular: Persistent opacification from previous contrast administration. Calcific atherosclerosis of carotid siphons. Skull: Normal. Negative for fracture or focal lesion. Sinuses/Orbits: Partial opacification of ethmoid and sphenoid sinuses with aerosolized secretions and partial opacification of mastoid air cells probably related to intubation. Orbits are unremarkable. Other: None. IMPRESSION: No large territory acute infarct or intracranial hemorrhage identified. Patchy foci of hypoattenuation in periventricular and subcortical white matter may represent areas of infarction or chronic microvascular ischemic changes and are stable. If clinically indicated MRI is more sensitive for acute ischemia. Electronically Signed   By: Kristine Garbe M.D.   On: 04/09/2016 22:51   Mr Virgel Paling CW Contrast  Result Date: 04/10/2016 CLINICAL DATA:   82 year old female status post neuro intervention on 04/09/2016 for acute onset right side weakness and inability to speak. Status post Bilateral cerebral angiogram with Revascularization of occluded LT MCA ,Lt ACA and Lt ICA terminus with x1 PASS WITH A COMBINATION OF solitaire 53m x 40 mm retrieval device with simultaneous aspiration at the intermediate guide catheter and flow gate guide catheter, and 3.6 mg of superselective intracranial INTEGRELIN. Initial encounter. EXAM: MRI HEAD WITHOUT CONTRAST MRA HEAD WITHOUT CONTRAST TECHNIQUE: Multiplanar, multiecho pulse sequences of the brain and surrounding structures were obtained without intravenous contrast. Angiographic images of the head were obtained using MRA technique without contrast. COMPARISON:  Post tPA head CT 04/09/2016 FINDINGS: MRI HEAD FINDINGS Brain: Multiple small cortically based and white-matter foci of restricted diffusion in the left temporal lobe, best seen on axial diffusion weighted imaging (series 4, image 20). Evidence of petechial hemorrhage in the left mesial temporal lobe which appears to track to the left cauda thalamic groove where diffusion is also heterogeneous. These areas demonstrate mild T2 and FLAIR hyperintensity without mass effect. In addition there are several other widely scattered mostly cortically based foci of restricted diffusion in both MCA territories at over the superior convexities. Similar scattered small infarcts in the left occipital lobe. One or 2 such areas in the left cerebellum. No other intracranial hemorrhage. No intracranial mass effect or ventriculomegaly. Superimposed patchy bilateral cerebral white matter T2 and FLAIR hyperintensity. No chronic cortical encephalomalacia. Right deep gray matter nuclei and brainstem are normal for age. Negative pituitary and cervicomedullary junction. Vascular: Major intracranial vascular flow voids are preserved. Skull and upper cervical spine: Negative. Normal bone  marrow signal. Sinuses/Orbits: Normal orbits soft tissues. Trace paranasal sinus mucosal thickening. Mild mastoid effusions. Other: Negative scalp soft tissues. MRA HEAD FINDINGS Mildly motion degraded. Antegrade flow in the posterior circulation with mildly dominant distal right vertebral artery. Normal PICA origins. Patent vertebrobasilar junction. Patent basilar artery without stenosis. Patent SCA and normal right PCA origins. Fetal type left PCA origin. Right  posterior communicating artery diminutive or absent. Absent flow signal in the inferior left PCA P3 division. Otherwise the bilateral PCA branches appear normal. Antegrade flow in both ICA siphons with some generalized ICA dolichoectasia and tortuosity of the distal cervical right ICA. Patent carotid termini. Normal MCA and ACA origins. Generalized ACA dolichoectasia. Proximal ACA branches appear normal. Both MCA bifurcations appear patent. MCA branch detail is degraded by motion. Fairly symmetric appearance of bilateral MCA branch flow signal. IMPRESSION: 1. Small volume of scattered acute infarcts in the left hemisphere primarily in the left temporal and occipital lobes (note fetal type left PCA origin anatomy). 2. Petechial hemorrhage in the mesial left temporal lobe tracking to the cauda thalamic groove. No associated mass effect and no malignant hemorrhagic transformation. 3. A small number of other scattered small infarcts in the bilateral MCA and left cerebellar artery territories might be related to endovascular intervention. 4. Motion degraded intracranial MRA negative for emergent large vessel occlusion or proximal intracranial stenosis. Possible occlusion of the left PCA P3 inferior division. Electronically Signed   By: Genevie Ann M.D.   On: 04/10/2016 19:16   Mr Brain Wo Contrast  Result Date: 04/26/2016 CLINICAL DATA:  Worsening hearing loss over the past 4-5 days. Stroke last month. EXAM: MRI HEAD WITHOUT CONTRAST TECHNIQUE: Multiplanar,  multiecho pulse sequences of the brain and surrounding structures were obtained without intravenous contrast. COMPARISON:  04/10/2016 FINDINGS: Brain: Small volume blood products are again seen in the mesial left temporal lobe extending to the region of inferior basal ganglia/ genu of the left internal capsule. Minimal residual diffusion signal abnormality is present in these locations related to the blood products. Abnormal trace diffusion signal elsewhere in the left greater than right cerebral hemispheres and left cerebellum on the prior study has resolved. There is minimal T2/FLAIR hyperintensity in the mesial left temporal lobe/ basal ganglia region and left temporal occipital region corresponding to the previously demonstrated infarcts. There is no evidence of acute infarct, new intracranial hemorrhage, mass, midline shift, or extra-axial fluid collection. Bilateral periventricular white matter T2 hyperintensities are unchanged and nonspecific but compatible with mild chronic small vessel ischemic disease. There is moderate cerebral atrophy. Vascular: Major intracranial vascular flow voids are preserved. Skull and upper cervical spine: Unremarkable bone marrow signal. Sinuses/Orbits: Unremarkable orbits. Minimal posterior right ethmoid air cell mucosal thickening. Large bilateral mastoid effusions, increased from prior. Fluid is now present in the middle ear cavities bilaterally. Other: None. IMPRESSION: 1. Residual small volume blood products in the mesial left temporal lobe and left internal capsule/inferior basal ganglia region associated with subacute infarcts demonstrated on the prior MRI. 2. Resolved diffusion abnormality associated with the small infarcts elsewhere. 3. No acute infarct or new intracranial abnormality. 4. Large bilateral mastoid and middle ear effusions. Electronically Signed   By: Logan Bores M.D.   On: 04/26/2016 19:03   Mr Brain Wo Contrast  Result Date: 04/10/2016 CLINICAL  DATA:  82 year old female status post neuro intervention on 04/09/2016 for acute onset right side weakness and inability to speak. Status post Bilateral cerebral angiogram with Revascularization of occluded LT MCA ,Lt ACA and Lt ICA terminus with x1 PASS WITH A COMBINATION OF solitaire 56m x 40 mm retrieval device with simultaneous aspiration at the intermediate guide catheter and flow gate guide catheter, and 3.6 mg of superselective intracranial INTEGRELIN. Initial encounter. EXAM: MRI HEAD WITHOUT CONTRAST MRA HEAD WITHOUT CONTRAST TECHNIQUE: Multiplanar, multiecho pulse sequences of the brain and surrounding structures were obtained without intravenous contrast. Angiographic  images of the head were obtained using MRA technique without contrast. COMPARISON:  Post tPA head CT 04/09/2016 FINDINGS: MRI HEAD FINDINGS Brain: Multiple small cortically based and white-matter foci of restricted diffusion in the left temporal lobe, best seen on axial diffusion weighted imaging (series 4, image 20). Evidence of petechial hemorrhage in the left mesial temporal lobe which appears to track to the left cauda thalamic groove where diffusion is also heterogeneous. These areas demonstrate mild T2 and FLAIR hyperintensity without mass effect. In addition there are several other widely scattered mostly cortically based foci of restricted diffusion in both MCA territories at over the superior convexities. Similar scattered small infarcts in the left occipital lobe. One or 2 such areas in the left cerebellum. No other intracranial hemorrhage. No intracranial mass effect or ventriculomegaly. Superimposed patchy bilateral cerebral white matter T2 and FLAIR hyperintensity. No chronic cortical encephalomalacia. Right deep gray matter nuclei and brainstem are normal for age. Negative pituitary and cervicomedullary junction. Vascular: Major intracranial vascular flow voids are preserved. Skull and upper cervical spine: Negative. Normal  bone marrow signal. Sinuses/Orbits: Normal orbits soft tissues. Trace paranasal sinus mucosal thickening. Mild mastoid effusions. Other: Negative scalp soft tissues. MRA HEAD FINDINGS Mildly motion degraded. Antegrade flow in the posterior circulation with mildly dominant distal right vertebral artery. Normal PICA origins. Patent vertebrobasilar junction. Patent basilar artery without stenosis. Patent SCA and normal right PCA origins. Fetal type left PCA origin. Right posterior communicating artery diminutive or absent. Absent flow signal in the inferior left PCA P3 division. Otherwise the bilateral PCA branches appear normal. Antegrade flow in both ICA siphons with some generalized ICA dolichoectasia and tortuosity of the distal cervical right ICA. Patent carotid termini. Normal MCA and ACA origins. Generalized ACA dolichoectasia. Proximal ACA branches appear normal. Both MCA bifurcations appear patent. MCA branch detail is degraded by motion. Fairly symmetric appearance of bilateral MCA branch flow signal. IMPRESSION: 1. Small volume of scattered acute infarcts in the left hemisphere primarily in the left temporal and occipital lobes (note fetal type left PCA origin anatomy). 2. Petechial hemorrhage in the mesial left temporal lobe tracking to the cauda thalamic groove. No associated mass effect and no malignant hemorrhagic transformation. 3. A small number of other scattered small infarcts in the bilateral MCA and left cerebellar artery territories might be related to endovascular intervention. 4. Motion degraded intracranial MRA negative for emergent large vessel occlusion or proximal intracranial stenosis. Possible occlusion of the left PCA P3 inferior division. Electronically Signed   By: Genevie Ann M.D.   On: 04/10/2016 19:16   Dg Chest Port 1 View  Result Date: 04/23/2016 CLINICAL DATA:  Aspiration pneumonia EXAM: PORTABLE CHEST 1 VIEW COMPARISON:  04/23/2016 at 12:20 FINDINGS: Mild streaky opacity in the  right days. Left lung is clear. Pulmonary vasculature is normal. No effusions. Hilar, mediastinal and cardiac contours are unremarkable and unchanged. IMPRESSION: Streaky right base opacity. No focal confluent consolidation. No significant change in the interim. Electronically Signed   By: Andreas Newport M.D.   On: 04/23/2016 23:41   Dg Chest Port 1 View  Result Date: 04/23/2016 CLINICAL DATA:  COPD, aspiration pneumonia. EXAM: PORTABLE CHEST 1 VIEW COMPARISON:  Portable chest x-ray of Sep 20, 2015 FINDINGS: The lungs are mildly hyperinflated. The interstitial markings are coarse. Increased density at the right lung base is consistent with the clinically suspected aspiration pneumonia. The heart and pulmonary vascularity are normal. There is calcification in the wall of the tortuous thoracic aorta. There is no  pleural effusion. The bony thorax exhibits no acute abnormality. IMPRESSION: Right basilar pneumonia consistent with aspiration. COPD. Thoracic aortic atherosclerosis. When the patient can tolerate the procedure, a PA and lateral chest x-ray would be useful. Electronically Signed   By: Autym Siess  [place] M.D.   On: 04/23/2016 12:34   Dg Chest Port 1 View  Result Date: 04/21/2016 CLINICAL DATA:  Initial evaluation for acute respiratory distress, wheezing. History of COPD. EXAM: PORTABLE CHEST 1 VIEW COMPARISON:  Prior radiograph from 04/21/2016. FINDINGS: Cardiac and mediastinal silhouettes are stable in size and contour, and remain within normal limits. Atherosclerotic disease noted within the aortic arch. Lungs are hypoinflated. Changes related COPD noted. Increased right basilar opacity favored to reflect atelectasis/bronchovascular crowding, although recurrent infiltrate not excluded. Left basilar atelectasis noted. Small left pleural effusion. No pulmonary edema or pneumothorax. No acute osseous abnormality. IMPRESSION: 1. Shallow lung inflation with increased right basilar opacity, favored to reflect  atelectasis/ bronchovascular crowding, although recurrent infiltrate could be considered in the correct clinical setting. 2. Small left pleural effusion with associated atelectasis. 3. COPD. Electronically Signed   By: Jeannine Boga M.D.   On: 04/21/2016 20:38   Dg Chest Port 1 View  Result Date: 04/21/2016 CLINICAL DATA:  Follow-up wheezing, COPD. EXAM: PORTABLE CHEST 1 VIEW COMPARISON:  04/20/2016 FINDINGS: There is hyperinflation of the lungs compatible with COPD. Heart and mediastinal contours are within normal limits. No focal opacities or effusions. No acute bony abnormality. IMPRESSION: COPD.  No active disease. Electronically Signed   By: Rolm Baptise M.D.   On: 04/21/2016 08:27   Dg Chest Port 1 View  Result Date: 04/20/2016 CLINICAL DATA:  Pneumonia. EXAM: PORTABLE CHEST 1 VIEW COMPARISON:  04/16/2016. FINDINGS: Mediastinum and hilar structures are normal. Heart size stable. Low lung volumes with basilar atelectasis. Slight improvement of right base infiltrate. A component of right base bronchiectasis cannot be excluded. Tiny left pleural effusion. No pneumothorax. IMPRESSION: 1. Interim partial clearing of right base infiltrate. A component of bronchiectasis in the right base cannot be excluded. 2. Low lung volumes.  Small left pleural effusion noted. Electronically Signed   By: Marcello Moores  Register   On: 04/20/2016 07:20   Dg Chest Port 1 View  Result Date: 04/16/2016 CLINICAL DATA:  Acute onset of shortness of breath. Initial encounter. EXAM: PORTABLE CHEST 1 VIEW COMPARISON:  Chest radiograph performed 04/14/2016 FINDINGS: Persistent right basilar airspace opacity remains concerning for pneumonia. No definite pleural effusion or pneumothorax is seen. Mild vascular congestion is noted. The cardiomediastinal silhouette is mildly enlarged. No acute osseous abnormalities are identified. IMPRESSION: 1. Persistent right basilar airspace opacity remains concerning for pneumonia. 2. Mild  vascular congestion and mild cardiomegaly. Electronically Signed   By: Garald Balding M.D.   On: 04/16/2016 06:26   Dg Chest Port 1 View  Result Date: 04/14/2016 CLINICAL DATA:  Acute onset of shortness of breath and cough. Initial encounter. EXAM: PORTABLE CHEST 1 VIEW COMPARISON:  Chest radiograph performed 04/11/2016 FINDINGS: Right basilar airspace opacity raises concern for pneumonia. No pleural effusion or pneumothorax is seen. The cardiomediastinal silhouette is borderline normal in size. No acute osseous abnormalities are identified. IMPRESSION: Right basilar pneumonia noted. Electronically Signed   By: Garald Balding M.D.   On: 04/14/2016 21:50   Dg Chest Port 1 View  Result Date: 04/11/2016 CLINICAL DATA:  82 year old female with respiratory failure EXAM: PORTABLE CHEST 1 VIEW COMPARISON:  Prior chest x-ray 04/09/2016 FINDINGS: The patient has been extubated and the nasogastric tube removed. Inspiratory  volumes are lower. Nonspecific patchy opacities in the right greater than left base. Stable cardiomegaly. Mild pulmonary vascular congestion without edema. Atherosclerotic, ectatic and tortuous thoracic aorta. Right IJ approach central venous catheter remains in unchanged position. The tip of the catheter is at the superior cavoatrial junction. Osseous structures are intact and unremarkable. IMPRESSION: 1. Interval extubation and removal of nasogastric tube. Right IJ central venous catheter remains in stable and satisfactory position. 2. Lower inspiratory volumes with increased bibasilar atelectasis and pulmonary vascular congestion. 3.  Aortic Atherosclerosis (ICD10-170.0) Electronically Signed   By: Jacqulynn Cadet M.D.   On: 04/11/2016 08:12   Dg Chest Port 1 View  Result Date: 04/10/2016 CLINICAL DATA:  Endotracheal tube and orogastric tube placement. Initial encounter. EXAM: PORTABLE CHEST 1 VIEW COMPARISON:  None. FINDINGS: The patient's endotracheal tube is seen ending 2 cm above the  carina. A right IJ line is noted ending about the distal SVC. An enteric tube is noted extending below the diaphragm. Mild bibasilar atelectasis is noted. No pleural effusion or pneumothorax is seen. The cardiomediastinal silhouette is borderline normal in size. No acute osseous abnormalities are identified. IMPRESSION: 1. Endotracheal tube seen ending 2 cm above the carina. 2. Right IJ line noted ending about the distal SVC. 3. Enteric tube noted extending below the diaphragm. 4. Mild bibasilar atelectasis. Electronically Signed   By: Garald Balding M.D.   On: 04/10/2016 00:02   Dg Abd Portable 1v  Result Date: 04/09/2016 CLINICAL DATA:  82 y/o  F; OG tube placement. EXAM: PORTABLE ABDOMEN - 1 VIEW COMPARISON:  None. FINDINGS: Patient is rotated. Normal bowel gas pattern. Enteric tube tip is below the diaphragm probably in the distal stomach. Slight densities projecting over the kidneys bilaterally may represent stones. Degenerative changes of the spine. IMPRESSION: Enteric tube tip probably in distal stomach. Densities projecting over kidneys may represent nephrolithiasis. Electronically Signed   By: Kristine Garbe M.D.   On: 04/09/2016 23:56   Dg Swallowing Func-speech Pathology  Result Date: 04/17/2016 Objective Swallowing Evaluation: Type of Study: MBS-Modified Barium Swallow Study Patient Details Name: Catherine Day MRN: [id number] Date of Birth: December 13, 1927 Today's Date: 04/17/2016 Time: SLP Start Time (ACUTE ONLY): 1045-SLP Stop Time (ACUTE ONLY): 1115 SLP Time Calculation (min) (ACUTE ONLY): 30 min Past Medical History: Past Medical History: Diagnosis Date . COPD (chronic obstructive pulmonary disease) ([place])  Past Surgical History: Past Surgical History: Procedure Laterality Date . IR GENERIC HISTORICAL  04/09/2016  IR ANGIO INTRA EXTRACRAN SEL COM CAROTID INNOMINATE UNI R MOD SED 04/09/2016 Luanne Bras, MD [facility] . IR GENERIC HISTORICAL  04/09/2016  IR PERCUTANEOUS ART  THROMBECTOMY/INFUSION INTRACRANIAL INC DIAG ANGIO 04/09/2016 Luanne Bras, MD [facility] . PELVIC FRACTURE SURGERY   . RADIOLOGY WITH ANESTHESIA N/A 04/09/2016  Procedure: RADIOLOGY WITH ANESTHESIA;  Surgeon: Luanne Bras, MD;  Location: [place];  Service: Radiology;  Laterality: N/A; . TEE WITHOUT CARDIOVERSION N/A 04/13/2016  Procedure: TRANSESOPHAGEAL ECHOCARDIOGRAM (TEE);  Surgeon: Sanda Klein, MD;  Location: [facility] ENDOSCOPY;  Service: Cardiovascular;  Laterality: N/A; HPI: 82 y.o. right handed female with h/o COPD. Presented 04/10/2016 with sudden onset of right-sided weakness and inability to speak. 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 Pt discharged to [facility] 11/28, however, later that night she became dyspneic and diaphoretic. She was given albuterol nebs with some improvement. CXR with RLL infiltrate. Subjective: pt awake, pleasant Assessment /  Plan / Recommendation CHL IP CLINICAL IMPRESSIONS 04/17/2016 Therapy Diagnosis Mild oral phase dysphagia;Mild pharyngeal phase dysphagia Clinical Impression Pt's swallow function demonstrates mild improvements since [facility] 11/26.  She presents with piecemeal oral bolusing; persisting delay in initiation (valleculae and pyriforms) -however, this is not necessarily considered disordered in the elderly population.  There may have been questionable, high penetration of liquids, but it was difficult to discern.  No aspiration. Pyriform sinuses appear to be asymmetric and there is mild residue remaining post-swallow. Recommend advancing diet to dysphagia 3, nectar-thick liquids for now.  Will follow for functional toleration/safety.   Impact on safety and function Mild aspiration risk   CHL IP TREATMENT RECOMMENDATION 04/17/2016 Treatment Recommendations Therapy as outlined in treatment plan below   Prognosis 04/17/2016 Prognosis  for Safe Diet Advancement Good Barriers to Reach Goals -- Barriers/Prognosis Comment -- CHL IP DIET RECOMMENDATION 04/17/2016 SLP Diet Recommendations Dysphagia 3 (Mech soft) solids;Nectar thick liquid Liquid Administration via Cup Medication Administration Whole meds with puree Compensations Small sips/bites;Slow rate Postural Changes --   CHL IP OTHER RECOMMENDATIONS 04/17/2016 Recommended Consults -- Oral Care Recommendations Oral care BID Other Recommendations --   CHL IP FOLLOW UP RECOMMENDATIONS 04/17/2016 Follow up Recommendations Inpatient Rehab   CHL IP FREQUENCY AND DURATION 04/17/2016 Speech Therapy Frequency (ACUTE ONLY) min 2x/week Treatment Duration 2 weeks      CHL IP ORAL PHASE 04/17/2016 Oral Phase Impaired Oral - Pudding Teaspoon -- Oral - Pudding Cup -- Oral - Honey Teaspoon -- Oral - Honey Cup -- Oral - Nectar Teaspoon -- Oral - Nectar Cup -- Oral - Nectar Straw -- Oral - Thin Teaspoon -- Oral - Thin Cup -- Oral - Thin Straw -- Oral - Puree Piecemeal swallowing Oral - Mech Soft Piecemeal swallowing Oral - Regular -- Oral - Multi-Consistency -- Oral - Pill -- Oral Phase - Comment --  CHL IP PHARYNGEAL PHASE 04/17/2016 Pharyngeal Phase Impaired Pharyngeal- Pudding Teaspoon -- Pharyngeal -- Pharyngeal- Pudding Cup -- Pharyngeal -- Pharyngeal- Honey Teaspoon -- Pharyngeal -- Pharyngeal- Honey Cup NT Pharyngeal -- Pharyngeal- Nectar Teaspoon -- Pharyngeal -- Pharyngeal- Nectar Cup Pharyngeal residue - pyriform;Delayed swallow initiation-pyriform sinuses Pharyngeal Material enters airway, remains ABOVE vocal cords then ejected out Pharyngeal- Nectar Straw -- Pharyngeal -- Pharyngeal- Thin Teaspoon -- Pharyngeal -- Pharyngeal- Thin Cup Delayed swallow initiation-pyriform sinuses;Pharyngeal residue - pyriform Pharyngeal Material enters airway, remains ABOVE vocal cords then ejected out Pharyngeal- Thin Straw -- Pharyngeal -- Pharyngeal- Puree Delayed swallow initiation-vallecula;Pharyngeal residue - pyriform  Pharyngeal -- Pharyngeal- Mechanical Soft Delayed swallow initiation-vallecula;Pharyngeal residue - pyriform Pharyngeal -- Pharyngeal- Regular -- Pharyngeal -- Pharyngeal- Multi-consistency -- Pharyngeal -- Pharyngeal- Pill -- Pharyngeal -- Pharyngeal Comment --  CHL IP CERVICAL ESOPHAGEAL PHASE 04/12/2016 Cervical Esophageal Phase WFL Pudding Teaspoon -- Pudding Cup -- Honey Teaspoon -- Honey Cup -- Nectar Teaspoon -- Nectar Cup -- Nectar Straw -- Thin Teaspoon -- Thin Cup -- Thin Straw -- Puree -- Mechanical Soft -- Regular -- Multi-consistency -- Pill -- Cervical Esophageal Comment -- No flowsheet data found. Juan Quam Laurice 04/17/2016, 11:50 AM              Dg Swallowing Func-speech Pathology  Result Date: 04/12/2016 Objective Swallowing Evaluation: Type of Study: MBS-Modified Barium Swallow Study Patient Details Name: Catherine Day MRN: [id number] Date of Birth: April 14, 1928 Today's Date: 04/12/2016 Time: SLP Start Time (ACUTE ONLY): 1116-SLP Stop Time (ACUTE ONLY): 1131 SLP Time Calculation (min) (ACUTE ONLY): 15 min Past Medical History: Past Medical History: Diagnosis Date . COPD (  chronic obstructive pulmonary disease) ([place])  Past Surgical History: Past Surgical History: Procedure Laterality Date . PELVIC FRACTURE SURGERY   . RADIOLOGY WITH ANESTHESIA N/A 04/09/2016  Procedure: RADIOLOGY WITH ANESTHESIA;  Surgeon: Luanne Bras, MD;  Location: [place];  Service: Radiology;  Laterality: N/A; HPI: Ms Gillott is an 82 yo female with h/o COPD admitted with mental status change, right sided weakness, hemianopsia, left gaze preference,  Pt required intubation and was extubated yesterday.  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  Swallow and speech eval ordered Subjective: pt awake, pleasant Assessment / Plan / Recommendation CHL IP CLINICAL IMPRESSIONS 04/12/2016 Therapy Diagnosis Moderate pharyngeal phase dysphagia  Clinical Impression Pt has a moderate pharyngeal dysphagia due in large part to delayed swallow initiation and decreased sensation of airway penetration. Liquids spill to the pyriform sinuses, resulting in deep, silent penetration of thin and nectar thick liquids. Although this does not occur with every bolus, it is silent and unable to be completely cleared with cued coughing, making her at a higher risk for an aspiration-related infection. Her strength is adequate with no significant residuals remaining post-swallow. Recommend Dys 3 diet and honey thick liquids. SLP to f/u for tolerance and readiness to advance. Impact on safety and function Moderate aspiration risk   CHL IP TREATMENT RECOMMENDATION 04/12/2016 Treatment Recommendations Therapy as outlined in treatment plan below   Prognosis 04/12/2016 Prognosis for Safe Diet Advancement Good Barriers to Reach Goals -- Barriers/Prognosis Comment -- CHL IP DIET RECOMMENDATION 04/12/2016 SLP Diet Recommendations Dysphagia 3 (Mech soft) solids;Honey thick liquids Liquid Administration via Cup;No straw Medication Administration Whole meds with puree Compensations Slow rate;Small sips/bites Postural Changes Remain semi-upright after after feeds/meals (Comment);Seated upright at 90 degrees   CHL IP OTHER RECOMMENDATIONS 04/12/2016 Recommended Consults -- Oral Care Recommendations Oral care BID Other Recommendations Order thickener from pharmacy;Prohibited food (jello, ice cream, thin soups);Remove water pitcher   CHL IP FOLLOW UP RECOMMENDATIONS 04/12/2016 Follow up Recommendations Inpatient Rehab   CHL IP FREQUENCY AND DURATION 04/12/2016 Speech Therapy Frequency (ACUTE ONLY) min 2x/week Treatment Duration 2 weeks      CHL IP ORAL PHASE 04/12/2016 Oral Phase WFL Oral - Pudding Teaspoon -- Oral - Pudding Cup -- Oral - Honey Teaspoon -- Oral - Honey Cup -- Oral - Nectar Teaspoon -- Oral - Nectar Cup -- Oral - Nectar Straw -- Oral - Thin Teaspoon -- Oral - Thin Cup -- Oral  - Thin Straw -- Oral - Puree -- Oral - Mech Soft -- Oral - Regular -- Oral - Multi-Consistency -- Oral - Pill -- Oral Phase - Comment --  CHL IP PHARYNGEAL PHASE 04/12/2016 Pharyngeal Phase Impaired Pharyngeal- Pudding Teaspoon -- Pharyngeal -- Pharyngeal- Pudding Cup -- Pharyngeal -- Pharyngeal- Honey Teaspoon -- Pharyngeal -- Pharyngeal- Honey Cup Delayed swallow initiation-pyriform sinuses Pharyngeal -- Pharyngeal- Nectar Teaspoon -- Pharyngeal -- Pharyngeal- Nectar Cup Delayed swallow initiation-pyriform sinuses;Penetration/Aspiration before swallow Pharyngeal Material enters airway, remains ABOVE vocal cords and not ejected out Pharyngeal- Nectar Straw -- Pharyngeal -- Pharyngeal- Thin Teaspoon -- Pharyngeal -- Pharyngeal- Thin Cup Delayed swallow initiation-pyriform sinuses;Penetration/Aspiration before swallow Pharyngeal Material enters airway, CONTACTS cords and not ejected out Pharyngeal- Thin Straw -- Pharyngeal -- Pharyngeal- Puree Delayed swallow initiation-vallecula Pharyngeal -- Pharyngeal- Mechanical Soft Delayed swallow initiation-vallecula Pharyngeal -- Pharyngeal- Regular -- Pharyngeal -- Pharyngeal- Multi-consistency -- Pharyngeal -- Pharyngeal- Pill -- Pharyngeal -- Pharyngeal Comment --  CHL IP CERVICAL ESOPHAGEAL PHASE 04/12/2016 Cervical Esophageal  Phase WFL Pudding Teaspoon -- Pudding Cup -- Honey Teaspoon -- Honey Cup -- Nectar Teaspoon -- Nectar Cup -- Nectar Straw -- Thin Teaspoon -- Thin Cup -- Thin Straw -- Puree -- Mechanical Soft -- Regular -- Multi-consistency -- Pill -- Cervical Esophageal Comment -- No flowsheet data found. Germain Osgood 04/12/2016, 1:05 PM  Germain Osgood, M.A. CCC-SLP [phone]             Ir Percutaneous Art Thrombectomy/infusion Intracranial Inc Diag Angio  Result Date: 04/13/2016 CLINICAL DATA:  Right-sided hemiplegia, left gaze preference, confusion, global aphasia. EXAM: IR PERCUTANEOUS ART THORMBECTOMY/INFUSION INTRACRANIAL INCLUDE DIAG ANGIO;  IR ANGIO INTRA EXTRACRAN SEL COM CAROTID INNOMINATE UNI RIGHT MOD SED PROCEDURE: Following a full explanation of the procedure along with the potential associated complications, an informed witnessed consent was obtained. Risks of intracranial hemorrhage of 10-15%, worsening neurological deficit, death, and the inability to revascularize were all reviewed in detail with the patient's daughter. Informed consent was obtained. The patient was then put under general anesthesia by the Department of Anesthesiology at [facility]. The right groin was prepped and draped in the usual sterile fashion. Thereafter using modified Seldinger technique, transfemoral access into the right common femoral artery was obtained without difficulty. Over a 0.035 inch guidewire a 5 French Pinnacle sheath was inserted. Through this, and also over a 0.035 inch guidewire a 5 [place] JB 1 catheter was advanced to the aortic arch region and selectively positioned in the left common carotid artery and later in the right common carotid artery. There were no acute complications. The patient tolerated the procedure well. Contrast: Isovue 300 approximately 70 mL. Anesthesia/Sedation:  General anesthesia. Medications: As per general anesthesia. FINDINGS: The right common carotid arteriogram demonstrates moderate to severe tortuosity of the left common carotid artery in its proximal portion with acute angular takeoff towards the aortic arch. Slow flow was seen ascending to the left internal carotid artery. There was no angiographic visualization of the left external carotid artery consistent with occlusion. The left internal carotid artery at the bulb demonstrated no occlusions with wide patency. 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 The right common carotid arteriogram demonstrates the right external carotid  artery and its major branches to be widely patent. The right internal carotid at the bulb and just distally appears widely patent. Focal areas of focal outpouching are seen to emanate from the posterior wall of the right internal carotid artery in the proximal one-third. Distal to this there is a double U shaped tortuosity of the mid left internal carotid artery. Distal to this the left internal carotid artery is seen to opacify normally to the cranial skull base. There is mild fusiform prominence of the petrous cavernous junction extending into the proximal cavernous artery. The distal cavernous carotid and supraclinoid segments are widely patent. There is a mild focal bulge noted at the level of the right posterior communicating artery. The right middle cerebral artery and the right anterior cerebral artery opacify normally into the capillary and venous phases. ENDOVASCULAR COMPLETE REVASCULARIZATION OF OCCLUDED LEFT INTERNAL CAROTID ARTERY TERMINUS, THE LEFT MIDDLE CEREBRAL ARTERY AND LEFT ANTERIOR CEREBRAL ARTERY. The diagnostic JB 1 catheter in the left common carotid artery was then exchanged over a 0.035 inch 300 cm Rosen exchange guidewire for a 8 French 55 cm Brite tip neurovascular sheath using biplane roadmap technique and constant fluoroscopic guidance. Good  aspiration obtained from the side port of the neurovascular sheath. This was then connected to continuous heparinized saline infusion. Over the [facility] exchange guidewire the distal end of which was at the origin of the left external carotid artery an 8 French 85 cm FlowGate balloon guide catheter which had been prepped with 50% contrast and 50% heparinized saline infusion was then advanced to just the origin of left internal carotid artery. The guidewire was removed. Good aspiration was obtained from the hub of the 8 [place] FlowGate guide catheter. Gentle contrast injection demonstrated no evidence of dissections or of intraluminal filling defects.  Over a 0.014 inch Softip Synchro micro guidewire, a 021 Trevo ProVue micro catheter was then advanced to just distal to the origin of the 85 cm 8 [place] FlowGate guide catheter. The micro catheter and micro guidewire were advanced without difficulty to the cranial skull base. However, further advancement of the micro catheter was met with herniation of the entire platform into the aortic arch. The entire system was then removed and replaced with a 8 French 80 cm Arrow neurovascular sheath which was positioned over an exchange micro guidewire just proximal to the origin of the left internal carotid artery. Over the [employer] guidewire, the 8 [place] 80 cm Arrow neurovascular sheath, the 8 French 85 cm FlowGate guide catheter was then advanced and positioned just proximal to the left internal carotid artery origin. The guidewire was removed. Good aspiration obtained from the hub of the 8 [place] FlowGate guide catheter. Gentle contrast injection demonstrated no evidence of spasms, dissections or of intraluminal filling defects. 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 Over a 0.014 inch Softip Synchro micro guidewire which had a J-tip configuration, the combination of an intermediary 6 French 132 cm Catalyst guide catheter inside of which was a Trevo ProVue micro catheter, the combination was advanced without difficulty to the supraclinoid left ICA. The micro guidewire was then gently advanced through the occluded left middle cerebral artery into the M2 M3 regions of the inferior division followed by the micro catheter and also followed by the 6 French 32 cm Catalyst guide catheter into the proximal left M1 segment. The micro guidewire was then removed. Aspiration was obtained from the hub of the micro catheter. Approximately 3.6 mg of arterial Integrilin were infused 1.8 mg through the micro catheter distally, and  1.8 mg through the 6 [place] Catalyst guide catheter. At this time, a 4 mm x 40 mm Solitaire FR retrieval device which had been prepped and purged with heparinized saline infusion was advanced to the distal end of the micro catheter in a coaxial manner and with constant heparinized saline infusion. The O rings on delivery micro catheter were then loosened. With slight forward gentle traction with the right hand on the delivery micro guidewire, with the left hand the delivery micro catheter was retrieved until it had been deployed. At this time, the 6 [place] Catalyst 132 cm guide catheter was advanced to just inside the proximal portion of the retrieval device. The occlusion balloon at the origin of the left internal carotid artery was then expanded. Also a 20 mL syringe was hooked to the hub of the 6 [place] Catalyst guide catheter and a constant aspiration was then applied. Thereon after, the combination of the retrieval device, with the micro catheter and the 6 [place] guide catheter were then gently retrieved with constant fluoroscopic guidance and  removed. Aspiration was continued with a 60 mL syringe at the hub of the 8 [place] FlowGate guide catheter which was then gently deflated. Free back bleed at the hub of the 8 [place] FlowGate guide catheter was achieved. The aspirate contained multiple focal chunks of grayish bloody thrombus. Similar thrombus was also noted within the 8 [place] Catalyst guide catheter. Clots were also noted in the interstices of the retrieval device. After having obtained free aspiration of blood at the hub of the 8 [place] FlowGate guide catheter, control arteriogram performed through the [facility] guide catheter in the left internal carotid artery demonstrated brisk antegrade flow to the cranial skull base and into the left middle cerebral artery and left anterior cerebral artery distributions to the distal distributions. No angiographic filling defects or occlusions were seen. There was no  evidence of spasm either. No mass-effect or midline shift was noted of the major vessels intracranially. The patient's hemodynamic and neurologic status remained stable. A TICI 3 reperfusion had been obtained. The 8 [place] FlowGate guide catheter and the 8 French 80 cm Arrow neurovascular sheath were then retrieved into the abdominal aorta and exchanged over a J-tiip guidewire for a 9 [place] Pinnacle sheath. This in turn was then connected to continuous heparinized saline infusion. The patient's distal pulses remained unchanged Dopplerable early compared to prior to the procedure. The right groin appeared soft as did the left groin at the site of the applied sheath. The patient was transported to the CT scanner for postprocedural CT scan of brain. IMPRESSION: Status post endovascular complete revascularization of occluded left internal carotid artery terminus, the left middle cerebral artery and left anterior cerebral artery with 1 pass with the Solitaire 4 mm x 40 mm retrieval device in combination with aspiration of the hub of the intermediary catheter and proximal flow arrest, achieving a TICI 3 reperfusion as described above. Electronically Signed   By: Luanne Bras M.D.   On: 04/10/2016 13:15   Ct Head Code Stroke Wo Contrast`  Result Date: 04/09/2016 CLINICAL DATA:  Code stroke for right-sided weakness and loss of speech. EXAM: CT HEAD WITHOUT CONTRAST TECHNIQUE: Contiguous axial images were obtained from the base of the skull through the vertex without intravenous contrast. COMPARISON:  None. FINDINGS: Brain: No evidence of acute infarction, hemorrhage, hydrocephalus, extra-axial collection or mass lesion/mass effect. Vascular: Atherosclerotic calcification.  No hyperdense vessel. Skull: Negative Sinuses/Orbits: Mild chronic sinusitis and left mastoiditis. Other: Results were called by telephone at the time of interpretation on 04/09/2016 at 6:41 pm to Dr. Lawana Pai, who verbally acknowledged these  results. ASPECTS [facility] Stroke Program Early CT Score) - Ganglionic level infarction (caudate, lentiform nuclei, internal capsule, insula, M1-M3 cortex): 7 - Supraganglionic infarction (M4-M6 cortex): 3 Total score (0-10 with 10 being normal): 10 IMPRESSION: No acute finding. ASPECTS is 10. Electronically Signed   By: Monte Fantasia M.D.   On: 04/09/2016 18:43   Ir Angio Intra Extracran Sel Com Carotid Innominate Uni R Mod Sed  Result Date: 04/13/2016 CLINICAL DATA:  Right-sided hemiplegia, left gaze preference, confusion, global aphasia. EXAM: IR PERCUTANEOUS ART THORMBECTOMY/INFUSION INTRACRANIAL INCLUDE DIAG ANGIO; IR ANGIO INTRA EXTRACRAN SEL COM CAROTID INNOMINATE UNI RIGHT MOD SED PROCEDURE: Following a full explanation of the procedure along with the potential associated complications, an informed witnessed consent was obtained. Risks of intracranial hemorrhage of 10-15%, worsening neurological deficit, death, and the inability to revascularize were all reviewed in detail with the patient's daughter. Informed consent was obtained. The patient was then put under  general anesthesia by the Department of Anesthesiology at [facility]. The right groin was prepped and draped in the usual sterile fashion. Thereafter using modified Seldinger technique, transfemoral access into the right common femoral artery was obtained without difficulty. Over a 0.035 inch guidewire a 5 French Pinnacle sheath was inserted. Through this, and also over a 0.035 inch guidewire a 5 [place] JB 1 catheter was advanced to the aortic arch region and selectively positioned in the left common carotid artery and later in the right common carotid artery. There were no acute complications. The patient tolerated the procedure well. Contrast: Isovue 300 approximately 70 mL. Anesthesia/Sedation:  General anesthesia. Medications: As per general anesthesia. FINDINGS: The right common carotid arteriogram demonstrates moderate to severe  tortuosity of the left common carotid artery in its proximal portion with acute angular takeoff towards the aortic arch. Slow flow was seen ascending to the left internal carotid artery. There was no angiographic visualization of the left external carotid artery consistent with occlusion. The left internal carotid artery at the bulb demonstrated no occlusions with wide patency. 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 The right common carotid arteriogram demonstrates the right external carotid artery and its major branches to be widely patent. The right internal carotid at the bulb and just distally appears widely patent. Focal areas of focal outpouching are seen to emanate from the posterior wall of the right internal carotid artery in the proximal one-third. Distal to this there is a double U shaped tortuosity of the mid left internal carotid artery. Distal to this the left internal carotid artery is seen to opacify normally to the cranial skull base. There is mild fusiform prominence of the petrous cavernous junction extending into the proximal cavernous artery. The distal cavernous carotid and supraclinoid segments are widely patent. There is a mild focal bulge noted at the level of the right posterior communicating artery. The right middle cerebral artery and the right anterior cerebral artery opacify normally into the capillary and venous phases. ENDOVASCULAR COMPLETE REVASCULARIZATION OF OCCLUDED LEFT INTERNAL CAROTID ARTERY TERMINUS, THE LEFT MIDDLE CEREBRAL ARTERY AND LEFT ANTERIOR CEREBRAL ARTERY. The diagnostic JB 1 catheter in the left common carotid artery was then exchanged over a 0.035 inch 300 cm Rosen exchange guidewire for a 8 French 55 cm Brite tip neurovascular sheath using biplane roadmap technique and constant fluoroscopic guidance. Good aspiration obtained from the  side port of the neurovascular sheath. This was then connected to continuous heparinized saline infusion. Over the [facility] exchange guidewire the distal end of which was at the origin of the left external carotid artery an 8 French 85 cm FlowGate balloon guide catheter which had been prepped with 50% contrast and 50% heparinized saline infusion was then advanced to just the origin of left internal carotid artery. The guidewire was removed. Good aspiration was obtained from the hub of the 8 [place] FlowGate guide catheter. Gentle contrast injection demonstrated no evidence of dissections or of intraluminal filling defects. Over a 0.014 inch Softip Synchro micro guidewire, a 021 Trevo ProVue micro catheter was then advanced to just distal to the origin of the 85 cm 8 [place] FlowGate guide catheter. The micro catheter and micro guidewire were advanced without difficulty to the cranial skull base. However, further advancement of the micro catheter was met with herniation of the entire platform into the aortic arch. The entire system  was then removed and replaced with a 8 French 80 cm Arrow neurovascular sheath which was positioned over an exchange micro guidewire just proximal to the origin of the left internal carotid artery. Over the [employer] guidewire, the 8 [place] 80 cm Arrow neurovascular sheath, the 8 French 85 cm FlowGate guide catheter was then advanced and positioned just proximal to the left internal carotid artery origin. The guidewire was removed. Good aspiration obtained from the hub of the 8 [place] FlowGate guide catheter. Gentle contrast injection demonstrated no evidence of spasms, dissections or of intraluminal filling defects. 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 Over a 0.014 inch Softip Synchro micro guidewire which had a J-tip configuration, the combination of an intermediary 6 French 132 cm  Catalyst guide catheter inside of which was a Trevo ProVue micro catheter, the combination was advanced without difficulty to the supraclinoid left ICA. The micro guidewire was then gently advanced through the occluded left middle cerebral artery into the M2 M3 regions of the inferior division followed by the micro catheter and also followed by the 6 French 32 cm Catalyst guide catheter into the proximal left M1 segment. The micro guidewire was then removed. Aspiration was obtained from the hub of the micro catheter. Approximately 3.6 mg of arterial Integrilin were infused 1.8 mg through the micro catheter distally, and 1.8 mg through the 6 [place] Catalyst guide catheter. At this time, a 4 mm x 40 mm Solitaire FR retrieval device which had been prepped and purged with heparinized saline infusion was advanced to the distal end of the micro catheter in a coaxial manner and with constant heparinized saline infusion. The O rings on delivery micro catheter were then loosened. With slight forward gentle traction with the right hand on the delivery micro guidewire, with the left hand the delivery micro catheter was retrieved until it had been deployed. At this time, the 6 [place] Catalyst 132 cm guide catheter was advanced to just inside the proximal portion of the retrieval device. The occlusion balloon at the origin of the left internal carotid artery was then expanded. Also a 20 mL syringe was hooked to the hub of the 6 [place] Catalyst guide catheter and a constant aspiration was then applied. Thereon after, the combination of the retrieval device, with the micro catheter and the 6 [place] guide catheter were then gently retrieved with constant fluoroscopic guidance and removed. Aspiration was continued with a 60 mL syringe at the hub of the 8 [place] FlowGate guide catheter which was then gently deflated. Free back bleed at the hub of the 8 [place] FlowGate guide catheter was achieved. The aspirate contained multiple focal  chunks of grayish bloody thrombus. Similar thrombus was also noted within the 8 [place] Catalyst guide catheter. Clots were also noted in the interstices of the retrieval device. After having obtained free aspiration of blood at the hub of the 8 [place] FlowGate guide catheter, control arteriogram performed through the [facility] guide catheter in the left internal carotid artery demonstrated brisk antegrade flow to the cranial skull base and into the left middle cerebral artery and left anterior cerebral artery distributions to the distal distributions. No angiographic filling defects or occlusions were seen. There was no evidence of spasm either. No mass-effect or midline shift was noted of the major vessels intracranially. The patient's hemodynamic and neurologic status remained stable. A TICI 3 reperfusion had been obtained. The  8 [place] FlowGate guide catheter and the 8 French 80 cm Arrow neurovascular sheath were then retrieved into the abdominal aorta and exchanged over a J-tiip guidewire for a 9 [place] Pinnacle sheath. This in turn was then connected to continuous heparinized saline infusion. The patient's distal pulses remained unchanged Dopplerable early compared to prior to the procedure. The right groin appeared soft as did the left groin at the site of the applied sheath. The patient was transported to the CT scanner for postprocedural CT scan of brain. IMPRESSION: Status post endovascular complete revascularization of occluded left internal carotid artery terminus, the left middle cerebral artery and left anterior cerebral artery with 1 pass with the Solitaire 4 mm x 40 mm retrieval device in combination with aspiration of the hub of the intermediary catheter and proximal flow arrest, achieving a TICI 3 reperfusion as described above. Electronically Signed   By: Luanne Bras M.D.   On: 04/10/2016 13:15     Microbiology: Recent Results (from the past 240 hour(s))  Urine culture     Status: None    Collection Time: 04/23/16 12:48 PM  Result Value Ref Range Status   Specimen Description URINE, RANDOM  Final   Special Requests NONE  Final   Culture NO GROWTH  Final   Report Status 04/24/2016 FINAL  Final     Labs: Basic Metabolic Panel:  Recent Labs Lab 04/23/16 0442 04/24/16 0554 04/25/16 0549 04/26/16 0446 04/27/16 0719  NA 139 137 138 137 139  K 3.7 3.6 3.6 4.0 3.7  CL 96* 92* 94* 94* 95*  CO2 29 34* 33* 34* 32  GLUCOSE 104* 109* 108* 158* 125*  BUN 27* 24* 28* 35* 41*  CREATININE 0.88 0.86 0.71 0.83 0.75  CALCIUM 8.5* 8.4* 8.4* 8.5* 8.7*  MG 2.0  --   --   --   --    Liver Function Tests:  Recent Labs Lab 04/21/16 0523  AST 29  ALT 24  ALKPHOS 50  BILITOT 0.6  PROT 6.0*  ALBUMIN 2.6*   No results for input(s): LIPASE, AMYLASE in the last 168 hours. No results for input(s): AMMONIA in the last 168 hours. CBC:  Recent Labs Lab 04/21/16 0523 04/22/16 0617 04/23/16 0442 04/24/16 0554  WBC 12.8* 15.4* 11.8* 10.7*  HGB 10.8* 11.8* 11.2* 11.8*  HCT 35.5* 38.7 35.9* 37.4  MCV 87.7 86.6 87.1 87.2  PLT 366 384 315 265   Cardiac Enzymes: No results for input(s): CKTOTAL, CKMB, CKMBINDEX, TROPONINI in the last 168 hours. BNP: Invalid input(s): POCBNP CBG: No results for input(s): GLUCAP in the last 168 hours.  Time coordinating discharge:  Greater than 30 minutes  Signed:  Nikesh Teschner, DO [facility] Pager: [phone] 04/27/2016, 12:58 PM

## 2016-04-27 NOTE — Clinical Social Work Placement (Signed)
   CLINICAL SOCIAL WORK PLACEMENT  NOTE  Date:  04/27/2016  Patient Details  Name: Catherine ClossRegina Gadsby MRN: [id number] Date of Birth: 08/13/1927  Clinical Social Work is seeking post-discharge placement for this patient at the Skilled  Nursing Facility level of care (*CSW will initial, date and re-position this form in  chart as items are completed):  Yes   Patient/family provided with [facility] Clinical Social Work Department's list of facilities offering this level of care within the geographic area requested by the patient (or if unable, by the patient's family).  Yes   Patient/family informed of their freedom to choose among providers that offer the needed level of care, that participate in Medicare, Medicaid or managed care program needed by the patient, have an available bed and are willing to accept the patient.  Yes   Patient/family informed of [facility] ownership interest in [facility] and [facility], as well as of the fact that they are under no obligation to receive care at these facilities.  PASRR submitted to EDS on 04/23/16     PASRR number received on 04/23/16     Existing PASRR number confirmed on       FL2 transmitted to all facilities in geographic area requested by pt/family on 04/23/16     FL2 transmitted to all facilities within larger geographic area on       Patient informed that his/her managed care company has contracts with or will negotiate with certain facilities, including the following:        Yes   Patient/family informed of bed offers received.  [facility]     Physician recommends and patient chooses bed at      [facility].  Patient to be transferred to facility by [facility]     Patient family notified on 04/27/16 of transfer.  Name of family member notified:  Bonita QuinLinda     PHYSICIAN Please sign FL2     Additional Comment:     _______________________________________________ Mearl LatinNadia S Blase Beckner, LCSWA 04/27/2016, 4:15 PM

## 2016-04-27 NOTE — Progress Notes (Signed)
Rehab admissions - I spoke with rehab MD and Dr Allena KatzPatel feels that we should move forward with SNF placement.  Daughter cannot provide supervision after rehab stay.  Patient likely to need SNF even after a potential inpatient rehab stay.  Recommend proceed directly with SNF placement.  Call me for questions.  #[phone]

## 2016-04-27 NOTE — Progress Notes (Signed)
OT Cancellation Note  Patient Details Name: Catherine ClossRegina Day MRN: [id number] DOB: 11/24/1927   Cancelled Treatment:    Reason Eval/Treat Not Completed: Patient declined, no reason specified ("I dont feel like doing anything right now"). Will follow up as time allows.  Gaye AlkenBailey A Cassidy Tabet M.S., OTR/L Pager: [phone]  04/27/2016, 12:01 PM

## 2016-04-27 NOTE — Progress Notes (Signed)
Patient will DC to: [facility] Anticipated DC date:  04/27/16 Family notified: Bonita QuinLinda  Transport by: [facility] 4pm   Per MD patient ready for DC to [facility]. RN, patient, patient's family, and facility notified of DC. Discharge Summary sent to facility. RN given number for report. DC packet on chart. Ambulance transport requested for patient.   CSW signing off.  Cristobal GoldmannNadia Molina [place]back, [place] Clinical Social Worker [phone]

## 2016-04-27 NOTE — Progress Notes (Signed)
Rehab admissions - I called patient's daughter and explained that we feel patient will need SNF placement.  [place]'s [place], so likely family will choose a SNF close to brother and grandchildren.  Call me for questions.  #[phone]

## 2016-04-27 NOTE — Progress Notes (Signed)
Hughie ClossRegina Edrington to be D/C'd to SNF per MD order.  Discussed with the patient's family and all questions fully answered.  VSS, Skin clean, dry and intact without evidence of skin break down, no evidence of skin tears noted. IV catheter discontinued intact. Site without signs and symptoms of complications. Dressing and pressure applied.  An After Visit Summary and prescription was printed and given to the [place].    D/c education completed with family including follow up instructions, medication list, d/c activities limitations if indicated, with other d/c instructions as indicated by MD - patient family able to verbalize understanding, all questions fully answered.   Patient family instructed to return to ED, call 911, or call MD for any changes in patient condition.   Patient was taken out of the hospital via ambulance to skill nursing facility  Melvenia Needlesreti O Nnenna Meador 04/27/2016 4:57 PM

## 2016-04-30 ENCOUNTER — Telehealth (HOSPITAL_COMMUNITY): Payer: Self-pay

## 2016-04-30 NOTE — Telephone Encounter (Signed)
Called to schedule f/u, left message for pt to return call. AW 

## 2016-05-09 ENCOUNTER — Encounter (HOSPITAL_COMMUNITY): Payer: Self-pay | Admitting: Emergency Medicine

## 2016-05-09 ENCOUNTER — Emergency Department (HOSPITAL_COMMUNITY): Payer: Medicare Other

## 2016-05-09 ENCOUNTER — Inpatient Hospital Stay (HOSPITAL_COMMUNITY): Payer: Medicare Other

## 2016-05-09 ENCOUNTER — Inpatient Hospital Stay (HOSPITAL_COMMUNITY)
Admission: EM | Admit: 2016-05-09 | Discharge: 2016-06-18 | DRG: 064 | Disposition: E | Payer: Medicare Other | Attending: Family Medicine | Admitting: Family Medicine

## 2016-05-09 DIAGNOSIS — Z7952 Long term (current) use of systemic steroids: Secondary | ICD-10-CM

## 2016-05-09 DIAGNOSIS — G9340 Encephalopathy, unspecified: Secondary | ICD-10-CM

## 2016-05-09 DIAGNOSIS — R0602 Shortness of breath: Secondary | ICD-10-CM

## 2016-05-09 DIAGNOSIS — J449 Chronic obstructive pulmonary disease, unspecified: Secondary | ICD-10-CM | POA: Diagnosis present

## 2016-05-09 DIAGNOSIS — J690 Pneumonitis due to inhalation of food and vomit: Secondary | ICD-10-CM | POA: Diagnosis present

## 2016-05-09 DIAGNOSIS — E785 Hyperlipidemia, unspecified: Secondary | ICD-10-CM | POA: Diagnosis present

## 2016-05-09 DIAGNOSIS — Z7189 Other specified counseling: Secondary | ICD-10-CM | POA: Diagnosis not present

## 2016-05-09 DIAGNOSIS — Z9189 Other specified personal risk factors, not elsewhere classified: Secondary | ICD-10-CM

## 2016-05-09 DIAGNOSIS — S0083XA Contusion of other part of head, initial encounter: Secondary | ICD-10-CM | POA: Diagnosis present

## 2016-05-09 DIAGNOSIS — I472 Ventricular tachycardia: Secondary | ICD-10-CM | POA: Diagnosis not present

## 2016-05-09 DIAGNOSIS — Z9981 Dependence on supplemental oxygen: Secondary | ICD-10-CM

## 2016-05-09 DIAGNOSIS — R4701 Aphasia: Secondary | ICD-10-CM | POA: Diagnosis present

## 2016-05-09 DIAGNOSIS — I10 Essential (primary) hypertension: Secondary | ICD-10-CM | POA: Diagnosis present

## 2016-05-09 DIAGNOSIS — I63013 Cerebral infarction due to thrombosis of bilateral vertebral arteries: Secondary | ICD-10-CM | POA: Diagnosis present

## 2016-05-09 DIAGNOSIS — I519 Heart disease, unspecified: Secondary | ICD-10-CM | POA: Diagnosis not present

## 2016-05-09 DIAGNOSIS — E870 Hyperosmolality and hypernatremia: Secondary | ICD-10-CM | POA: Diagnosis not present

## 2016-05-09 DIAGNOSIS — E876 Hypokalemia: Secondary | ICD-10-CM | POA: Diagnosis present

## 2016-05-09 DIAGNOSIS — Z6830 Body mass index (BMI) 30.0-30.9, adult: Secondary | ICD-10-CM | POA: Diagnosis not present

## 2016-05-09 DIAGNOSIS — R0603 Acute respiratory distress: Secondary | ICD-10-CM | POA: Diagnosis not present

## 2016-05-09 DIAGNOSIS — R627 Adult failure to thrive: Secondary | ICD-10-CM | POA: Diagnosis present

## 2016-05-09 DIAGNOSIS — I69391 Dysphagia following cerebral infarction: Secondary | ICD-10-CM

## 2016-05-09 DIAGNOSIS — I639 Cerebral infarction, unspecified: Secondary | ICD-10-CM | POA: Diagnosis present

## 2016-05-09 DIAGNOSIS — R059 Cough, unspecified: Secondary | ICD-10-CM

## 2016-05-09 DIAGNOSIS — I63419 Cerebral infarction due to embolism of unspecified middle cerebral artery: Secondary | ICD-10-CM

## 2016-05-09 DIAGNOSIS — I63132 Cerebral infarction due to embolism of left carotid artery: Secondary | ICD-10-CM | POA: Diagnosis not present

## 2016-05-09 DIAGNOSIS — R1310 Dysphagia, unspecified: Secondary | ICD-10-CM | POA: Diagnosis present

## 2016-05-09 DIAGNOSIS — E669 Obesity, unspecified: Secondary | ICD-10-CM | POA: Diagnosis present

## 2016-05-09 DIAGNOSIS — J441 Chronic obstructive pulmonary disease with (acute) exacerbation: Secondary | ICD-10-CM | POA: Diagnosis not present

## 2016-05-09 DIAGNOSIS — I69351 Hemiplegia and hemiparesis following cerebral infarction affecting right dominant side: Secondary | ICD-10-CM

## 2016-05-09 DIAGNOSIS — W19XXXA Unspecified fall, initial encounter: Secondary | ICD-10-CM | POA: Diagnosis present

## 2016-05-09 DIAGNOSIS — R1312 Dysphagia, oropharyngeal phase: Secondary | ICD-10-CM | POA: Diagnosis not present

## 2016-05-09 DIAGNOSIS — I110 Hypertensive heart disease with heart failure: Secondary | ICD-10-CM | POA: Diagnosis present

## 2016-05-09 DIAGNOSIS — J410 Simple chronic bronchitis: Secondary | ICD-10-CM | POA: Diagnosis not present

## 2016-05-09 DIAGNOSIS — Z79899 Other long term (current) drug therapy: Secondary | ICD-10-CM

## 2016-05-09 DIAGNOSIS — H9190 Unspecified hearing loss, unspecified ear: Secondary | ICD-10-CM | POA: Diagnosis present

## 2016-05-09 DIAGNOSIS — T17908A Unspecified foreign body in respiratory tract, part unspecified causing other injury, initial encounter: Secondary | ICD-10-CM

## 2016-05-09 DIAGNOSIS — R042 Hemoptysis: Secondary | ICD-10-CM | POA: Diagnosis not present

## 2016-05-09 DIAGNOSIS — R0600 Dyspnea, unspecified: Secondary | ICD-10-CM

## 2016-05-09 DIAGNOSIS — Y92129 Unspecified place in nursing home as the place of occurrence of the external cause: Secondary | ICD-10-CM

## 2016-05-09 DIAGNOSIS — Z7951 Long term (current) use of inhaled steroids: Secondary | ICD-10-CM | POA: Diagnosis not present

## 2016-05-09 DIAGNOSIS — I5042 Chronic combined systolic (congestive) and diastolic (congestive) heart failure: Secondary | ICD-10-CM | POA: Diagnosis present

## 2016-05-09 DIAGNOSIS — I5023 Acute on chronic systolic (congestive) heart failure: Secondary | ICD-10-CM | POA: Diagnosis not present

## 2016-05-09 DIAGNOSIS — J9811 Atelectasis: Secondary | ICD-10-CM | POA: Diagnosis present

## 2016-05-09 DIAGNOSIS — J9611 Chronic respiratory failure with hypoxia: Secondary | ICD-10-CM | POA: Diagnosis present

## 2016-05-09 DIAGNOSIS — Z515 Encounter for palliative care: Secondary | ICD-10-CM | POA: Diagnosis present

## 2016-05-09 DIAGNOSIS — R29810 Facial weakness: Secondary | ICD-10-CM | POA: Diagnosis present

## 2016-05-09 DIAGNOSIS — R05 Cough: Secondary | ICD-10-CM

## 2016-05-09 DIAGNOSIS — Z66 Do not resuscitate: Secondary | ICD-10-CM | POA: Diagnosis present

## 2016-05-09 DIAGNOSIS — I63429 Cerebral infarction due to embolism of unspecified anterior cerebral artery: Secondary | ICD-10-CM

## 2016-05-09 DIAGNOSIS — I5189 Other ill-defined heart diseases: Secondary | ICD-10-CM | POA: Diagnosis present

## 2016-05-09 HISTORY — DX: Cerebral infarction, unspecified: I63.9

## 2016-05-09 LAB — CBC WITH DIFFERENTIAL/PLATELET
BASOS ABS: 0 10*3/uL (ref 0.0–0.1)
BASOS PCT: 0 %
EOS ABS: 0.1 10*3/uL (ref 0.0–0.7)
EOS PCT: 1 %
HCT: 36.2 % (ref 36.0–46.0)
Hemoglobin: 11.7 g/dL — ABNORMAL LOW (ref 12.0–15.0)
Lymphocytes Relative: 13 %
Lymphs Abs: 1.6 10*3/uL (ref 0.7–4.0)
MCH: 28 pg (ref 26.0–34.0)
MCHC: 32.3 g/dL (ref 30.0–36.0)
MCV: 86.6 fL (ref 78.0–100.0)
MONO ABS: 0.8 10*3/uL (ref 0.1–1.0)
Monocytes Relative: 6 %
Neutro Abs: 9.4 10*3/uL — ABNORMAL HIGH (ref 1.7–7.7)
Neutrophils Relative %: 80 %
PLATELETS: 196 10*3/uL (ref 150–400)
RBC: 4.18 MIL/uL (ref 3.87–5.11)
RDW: 16 % — AB (ref 11.5–15.5)
WBC: 11.8 10*3/uL — ABNORMAL HIGH (ref 4.0–10.5)

## 2016-05-09 LAB — COMPREHENSIVE METABOLIC PANEL
ALBUMIN: 3 g/dL — AB (ref 3.5–5.0)
ALT: 24 U/L (ref 14–54)
AST: 26 U/L (ref 15–41)
Alkaline Phosphatase: 72 U/L (ref 38–126)
Anion gap: 9 (ref 5–15)
BUN: 15 mg/dL (ref 6–20)
CHLORIDE: 98 mmol/L — AB (ref 101–111)
CO2: 29 mmol/L (ref 22–32)
Calcium: 8 mg/dL — ABNORMAL LOW (ref 8.9–10.3)
Creatinine, Ser: 0.83 mg/dL (ref 0.44–1.00)
GFR calc Af Amer: >60 mL/min (ref >60)
Glucose, Bld: 127 mg/dL — ABNORMAL HIGH (ref 65–99)
POTASSIUM: 3.5 mmol/L (ref 3.5–5.1)
SODIUM: 136 mmol/L (ref 135–145)
Total Bilirubin: 1.1 mg/dL (ref 0.3–1.2)
Total Protein: 6.9 g/dL (ref 6.5–8.1)

## 2016-05-09 LAB — TROPONIN I

## 2016-05-09 LAB — URINALYSIS, ROUTINE W REFLEX MICROSCOPIC
Bilirubin Urine: NEGATIVE
GLUCOSE, UA: NEGATIVE mg/dL
Hgb urine dipstick: NEGATIVE
Ketones, ur: NEGATIVE mg/dL
LEUKOCYTES UA: NEGATIVE
Nitrite: NEGATIVE
PROTEIN: NEGATIVE mg/dL
Specific Gravity, Urine: 1.02 (ref 1.005–1.030)
pH: 5 (ref 5.0–8.0)

## 2016-05-09 LAB — PROTIME-INR
INR: 1.1
Prothrombin Time: 14.2 seconds (ref 11.4–15.2)

## 2016-05-09 LAB — PHOSPHORUS: Phosphorus: 2.8 mg/dL (ref 2.5–4.6)

## 2016-05-09 LAB — MAGNESIUM: MAGNESIUM: 1.8 mg/dL (ref 1.7–2.4)

## 2016-05-09 MED ORDER — FUROSEMIDE 10 MG/ML IJ SOLN
40.0000 mg | Freq: Once | INTRAMUSCULAR | Status: AC
  Administered 2016-05-09: 40 mg via INTRAVENOUS
  Filled 2016-05-09: qty 4

## 2016-05-09 MED ORDER — ACETAMINOPHEN 160 MG/5ML PO SOLN: 650.0000 mg | ORAL | Status: DC | PRN

## 2016-05-09 MED ORDER — ASPIRIN 325 MG PO TABS
325.0000 mg | ORAL_TABLET | Freq: Every day | ORAL | Status: DC
  Administered 2016-05-15 – 2016-05-21 (×7): 325 mg via ORAL
  Filled 2016-05-09 (×7): qty 1

## 2016-05-09 MED ORDER — POTASSIUM CHLORIDE IN NACL 20-0.9 MEQ/L-% IV SOLN
INTRAVENOUS | Status: DC
  Administered 2016-05-09: 11:00:00 via INTRAVENOUS
  Filled 2016-05-09: qty 1000

## 2016-05-09 MED ORDER — ENOXAPARIN SODIUM 40 MG/0.4ML ~~LOC~~ SOLN
40.0000 mg | SUBCUTANEOUS | Status: DC
  Administered 2016-05-09 – 2016-05-21 (×13): 40 mg via SUBCUTANEOUS
  Filled 2016-05-09 (×13): qty 0.4

## 2016-05-09 MED ORDER — IPRATROPIUM-ALBUTEROL 0.5-2.5 (3) MG/3ML IN SOLN
3.0000 mL | Freq: Every day | RESPIRATORY_TRACT | Status: DC
  Administered 2016-05-09 – 2016-05-10 (×2): 3 mL via RESPIRATORY_TRACT
  Filled 2016-05-09 (×2): qty 3

## 2016-05-09 MED ORDER — TIOTROPIUM BROMIDE MONOHYDRATE 18 MCG IN CAPS
18.0000 ug | ORAL_CAPSULE | Freq: Every day | RESPIRATORY_TRACT | Status: DC
  Administered 2016-05-10: 18 ug via RESPIRATORY_TRACT
  Filled 2016-05-09: qty 5

## 2016-05-09 MED ORDER — IPRATROPIUM-ALBUTEROL 0.5-2.5 (3) MG/3ML IN SOLN
3.0000 mL | Freq: Two times a day (BID) | RESPIRATORY_TRACT | Status: DC | PRN
  Filled 2016-05-09: qty 3

## 2016-05-09 MED ORDER — ASPIRIN 300 MG RE SUPP
300.0000 mg | Freq: Every day | RECTAL | Status: DC
  Administered 2016-05-09 – 2016-05-14 (×6): 300 mg via RECTAL
  Filled 2016-05-09 (×6): qty 1

## 2016-05-09 MED ORDER — ACETAMINOPHEN 650 MG RE SUPP
650.0000 mg | RECTAL | Status: DC | PRN
  Administered 2016-05-09: 650 mg via RECTAL
  Filled 2016-05-09: qty 1

## 2016-05-09 MED ORDER — STROKE: EARLY STAGES OF RECOVERY BOOK
Freq: Once | Status: DC
  Filled 2016-05-09: qty 1

## 2016-05-09 MED ORDER — IPRATROPIUM-ALBUTEROL 0.5-2.5 (3) MG/3ML IN SOLN
3.0000 mL | RESPIRATORY_TRACT | Status: DC | PRN
Start: 2016-05-09 — End: 2016-05-21
  Administered 2016-05-09: 3 mL via RESPIRATORY_TRACT

## 2016-05-09 MED ORDER — SENNOSIDES-DOCUSATE SODIUM 8.6-50 MG PO TABS: 1.0000 | ORAL_TABLET | Freq: Every evening | ORAL | Status: DC | PRN

## 2016-05-09 MED ORDER — IPRATROPIUM-ALBUTEROL 0.5-2.5 (3) MG/3ML IN SOLN: 3.0000 mL | RESPIRATORY_TRACT | Status: DC

## 2016-05-09 MED ORDER — FLUTICASONE FUROATE-VILANTEROL 100-25 MCG/INH IN AEPB
1.0000 | INHALATION_SPRAY | Freq: Every day | RESPIRATORY_TRACT | Status: DC
  Administered 2016-05-10 – 2016-05-21 (×4): 1 via RESPIRATORY_TRACT
  Filled 2016-05-09 (×2): qty 28

## 2016-05-09 MED ORDER — ACETAMINOPHEN 325 MG PO TABS: 650.0000 mg | ORAL_TABLET | ORAL | Status: DC | PRN

## 2016-05-09 MED ORDER — SODIUM CHLORIDE 0.9 % IV SOLN: INTRAVENOUS | Status: DC

## 2016-05-09 NOTE — ED Notes (Signed)
Sp[facility] with [facility] about patient baseline activity. Per caregiver she is normally able to lift her right arm some with assistance but does not have the ability to lift her right leg. She states that the pt is normally able to converse. She states that patient fell at about 2330 and that was when symptoms were discovered. Her last known well was unknown.

## 2016-05-09 NOTE — Progress Notes (Signed)
With instruction from neuro MD wanting CT perfusion test stat, orders given to obtain 18G IV or larger [facility]. RN attempted 18G IV x1, another RN attempted yet unsuccessful. Paged IV team to attempt, two IV team RNs were unsuccessful at placing 18G IV as well. Paged neuro MD to give an update, no response at this time. Will attempt again, and continue to monitor pt.

## 2016-05-09 NOTE — ED Notes (Signed)
Attempted report x 1 to 5W per Lowella BandyNikki they are "unsure at this time if the pt will be going to this room /floor and [facility] aware."

## 2016-05-09 NOTE — H&P (Signed)
History and Physical    Catherine ClossRegina Burrowes BJY:[id number] DOB: 07/24/1927 DOA: 11-23-15   PCP: No primary care provider on file.   Patient coming from/Resides with: SNF  Admission status: Inpatient/Telemetry-medically necessary to stay a minimum 2 midnights to rule out impending and/or unexpected changes in physiologic status that may differ from initial evaluation performed in the ER and/or at time of admission.   Chief Complaint: Fall with worsened R side deficits  HPI: Catherine Day is a 82 y.o. female with medical history significant for O2 dependent COPD and recent stroke. Prior to last admission patient was living in [place] and was in [place] visiting her son. Patient was recently discharged on 12/11 after a protracted hospitalization for Left-side embolic stroke. She was initially admitted on 11/24 with right-sided deficits and underwent tPA for MRI findings of small bilateral MCA and left cerebellar artery territory infarcts. Additionally she underwent bilateral common carotid arteriograms followed by complete revascularization of occluded left MCA, left ACA and left ICA terminus. Neurology recommended aspirin therapy for CVA prophylaxis. TEE demonstrated no intracardiac source of embolism or shunt. She stabilized enough to transfer to CIR for rehabilitative therapies but was sent back to the acute care portion of the hospital secondary to hypoxia related to aspiration pneumonitis. At time of discharge to skilled nursing facility patient was alert and confused and had minimal usage of the right upper extremity. PT recommended skilled nursing facility placement. In addition to the stroke symptoms there was documentation the patient also demonstrated new hearing loss post stroke. Hospital course was also complicated by ESBL positive Escherichia coli UTI and she completed 7 days of meropenem.  Patient has been sent back to the ER after a fall. She was last seen normal earlier in the evening  without specific time given. Per ER notes the patient fell about 11:30 PM and at that time it was discovered that the patient was not moving the right side as per previous baseline. Stroke workup initiated in the ER with MRI revealing acute infarcts within the left subinsular white matter, left frontal operculum the left basal ganglia with an area of punctate possible ischemia in the left cerebral peduncle. Neurology consultation was pending at time of admission.  ED Course:  Vital Signs: BP 126/70   Pulse 81   Temp 97.6 F (36.4 C) (Oral)   Resp 24   Ht 5\' 2"  (1.575 m)   Wt 73.9 kg (163 lb)   SpO2 100%   BMI 29.81 kg/m  DG right knee: No acute fracture or dislocation 2 view CXR: Shallow inspiration with bibasilar atelectatic changes without infiltrate CT head without contrast: No acute intracranial hemorrhage, focal area of hypodensity extending from the lateral aspect of the left lateral ventricle appears new since prior CT and may represent a subacute or less likely an acute infarct. CT cervical spine: No acute or traumatic cervical spine pathology MRI brain without contrast: Acute infarct within the left subinsular white matter, left frontal operculum and left basal ganglia without hemorrhage midline shift or mass effect. Also a punctate focus this of possible ischemia of the left cerebral peduncle Lab data: Sodium 136, potassium 3.5, chloride 98, BUN 15, creatinine 0.3, glucose 127, albumin 3.0, LFTs normal, troponin less than 0.03, white count 11,800 with neutrophils 80% with absolute neutrophils 9.4%, hemoglobin 11.7, platelets 196,000. Urinalysis with hazy appearance and specific gravity 1.020 Medications and treatments: None  Review of Systems:  In addition to the HPI above, **patient aphasic and unable to obtain history therefore  history obtained from the chart   Past Medical History:  Diagnosis Date  . COPD (chronic obstructive pulmonary disease) (HCC)   . Stroke [facility](HCC)      Past Surgical History:  Procedure Laterality Date  . IR GENERIC HISTORICAL  04/09/2016   IR ANGIO INTRA EXTRACRAN SEL COM CAROTID INNOMINATE UNI R MOD SED 04/09/2016 Julieanne CottonSanjeev Deveshwar, MD [facility]-INTERV RAD  . IR GENERIC HISTORICAL  04/09/2016   IR PERCUTANEOUS ART THROMBECTOMY/INFUSION INTRACRANIAL INC DIAG ANGIO 04/09/2016 Julieanne CottonSanjeev Deveshwar, MD [facility]  . PELVIC FRACTURE SURGERY    . RADIOLOGY WITH ANESTHESIA N/A 04/09/2016   Procedure: RADIOLOGY WITH ANESTHESIA;  Surgeon: Julieanne CottonSanjeev Deveshwar, MD;  Location: [facility] OR;  Service: Radiology;  Laterality: N/A;  . TEE WITHOUT CARDIOVERSION N/A 04/13/2016   Procedure: TRANSESOPHAGEAL ECHOCARDIOGRAM (TEE);  Surgeon: Thurmon FairMihai Croitoru, MD;  [facility];  Service: Cardiovascular;  Laterality: N/A;    Social History   Social History  . Marital status: Married    Spouse name: N/A  . Number of children: N/A  . Years of education: N/A   Occupational History  . Not on file.   Social History Main Topics  . Smoking status: Never Smoker  . Smokeless tobacco: Never Used     Comment: quit 1970's  . Alcohol use No  . Drug use: No  . Sexual activity: No   Other Topics Concern  . Not on file   Social History Narrative  . No narrative on file    Mobility: Per PT notes: Mobility with supervision with decreased awareness of deficits and poor safety awareness-required rolling walker at time of discharge to ambulate Work history: Not obtained   No Known Allergies  Family history unable to be obtained from patient secondary to underlying aphasia  Prior to Admission medications   Medication Sig Start Date End Date Taking? Authorizing Provider  atorvastatin (LIPITOR) 40 MG tablet Take 1 tablet (40 mg total) by mouth daily at 6 PM. 04/27/16  Yes Catarina Hartshornavid Tat, MD  Calcium Citrate-Vitamin D (CITRACAL + D PO) Take 1 tablet by mouth daily.   Yes Historical Provider, MD  denosumab (PROLIA) 60 MG/ML SOLN injection Inject 60 mg into the skin  every 6 (six) months. Administer in upper arm, thigh, or abdomen Last injection 03/24/16   Yes Historical Provider, MD  fluticasone furoate-vilanterol (BREO ELLIPTA) 100-25 MCG/INH AEPB Inhale 1 puff into the lungs daily.   Yes Historical Provider, MD  furosemide (LASIX) 40 MG tablet Take 1 tablet (40 mg total) by mouth daily. 04/28/16  Yes David Tat, MD  ipratropium-albuterol (DUONEB) 0.5-2.5 (3) MG/3ML SOLN Take 3 mLs by nebulization See admin instructions. Inhale 1 vial by nebulization every morning and may also use 2 more times during the day as needed for shortness of breath or wheezing   Yes Historical Provider, MD  levalbuterol (XOPENEX HFA) 45 MCG/ACT inhaler Inhale 1-2 puffs into the lungs every 4 (four) hours as needed for wheezing or shortness of breath.   Yes Historical Provider, MD  levalbuterol Pauline Aus(XOPENEX) 0.63 MG/3ML nebulizer solution Take 0.63 mg by nebulization every 6 (six) hours as needed for wheezing or shortness of breath.   Yes Historical Provider, MD  levofloxacin (LEVAQUIN) 500 MG tablet Take 500 mg by mouth daily.   Yes Historical Provider, MD  Multiple Vitamins-Minerals (DECUBI-VITE PO) Take 1 capsule by mouth daily.   Yes Historical Provider, MD  OXYGEN Inhale 2 L into the lungs continuous.   Yes Historical Provider, MD  tiotropium (SPIRIVA) 18 MCG  inhalation capsule Place 18 mcg into inhaler and inhale daily.   Yes Historical Provider, MD  valsartan (DIOVAN) 40 MG tablet Take 1 tablet (40 mg total) by mouth daily. 04/28/16  Yes Catarina Hartshorn, MD  vitamin C (ASCORBIC ACID) 500 MG tablet Take 500 mg by mouth 2 (two) times daily.   Yes Historical Provider, MD  zinc sulfate 220 (50 Zn) MG capsule Take 220 mg by mouth daily.   Yes Historical Provider, MD    Physical Exam: Vitals:   06-01-2016 0423 06-01-2016 0424 06-01-2016 0430 Jun 01, 2016 0436  BP:   126/70   Pulse: 83 83  81  Resp: 26 (!) 39 25 24  Temp:      TempSrc:      SpO2: 100% 100%  100%  Weight:      Height:           Constitutional: NAD, calm, And appears comfortable Eyes: PERRL, lids and conjunctivae normal ENMT: Mucous membranes are dry. Posterior pharynx clear of any exudate or lesions, poor dentition.  Neck: normal, supple, no masses, no thyromegaly Respiratory: clear to auscultation bilaterally, no wheezing, no crackles. Normal respiratory effort. No accessory muscle use. Upper airway/pharyngeal "wetness"/gurgling sounds Cardiovascular: Regular rate and rhythm, no murmurs / rubs / gallops. No extremity edema. 2+ peripheral pulses. No carotid bruits. Bilateral hands and feet are bluish discolored and cold to the touch with poor capillary refill with documented intact pulses Abdomen: no tenderness, no masses palpated. No hepatosplenomegaly. Bowel sounds positive.  Musculoskeletal: no clubbing / cyanosis. No joint deformity upper and lower extremities. Good ROM, no contractures. Normal muscle tone.  Skin: no rashes, lesions, ulcers. No induration Neurologic: CN 2-12 grossly intact. Sensation intact, DTR normal left, diminished right. Strength 5/5 on the left side. Right side flaccid. Able to follow simple commands but remains aphasic Psychiatric: Unable to determine accurately since patient is aphasic. She is following commands appropriately and smiles appropriately.    Labs on Admission: I have personally reviewed following labs and imaging studies  CBC:  Recent Labs Lab 06-01-2016 0307  WBC 11.8*  NEUTROABS 9.4*  HGB 11.7*  HCT 36.2  MCV 86.6  PLT 196   Basic Metabolic Panel:  Recent Labs Lab 06/01/16 0307  NA 136  K 3.5  CL 98*  CO2 29  GLUCOSE 127*  BUN 15  CREATININE 0.83  CALCIUM 8.0*   GFR: Estimated Creatinine Clearance: 45.8 mL/min (by C-G formula based on SCr of 0.83 mg/dL). Liver Function Tests:  Recent Labs Lab 06-01-16 0307  AST 26  ALT 24  ALKPHOS 72  BILITOT 1.1  PROT 6.9  ALBUMIN 3.0*   No results for input(s): LIPASE, AMYLASE in the last 168  hours. No results for input(s): AMMONIA in the last 168 hours. Coagulation Profile: No results for input(s): INR, PROTIME in the last 168 hours. Cardiac Enzymes:  Recent Labs Lab Jun 01, 2016 0307  TROPONINI <0.03   BNP (last 3 results) No results for input(s): PROBNP in the last 8760 hours. HbA1C: No results for input(s): HGBA1C in the last 72 hours. CBG: No results for input(s): GLUCAP in the last 168 hours. Lipid Profile: No results for input(s): CHOL, HDL, LDLCALC, TRIG, CHOLHDL, LDLDIRECT in the last 72 hours. Thyroid Function Tests: No results for input(s): TSH, T4TOTAL, FREET4, T3FREE, THYROIDAB in the last 72 hours. Anemia Panel: No results for input(s): VITAMINB12, FOLATE, FERRITIN, TIBC, IRON, RETICCTPCT in the last 72 hours. Urine analysis:    Component Value Date/Time   COLORURINE YELLOW  05/15/2016 0419   APPEARANCEUR HAZY (A) 04/21/2016 0419   LABSPEC 1.020 05/03/2016 0419   PHURINE 5.0 04/27/2016 0419   GLUCOSEU NEGATIVE 05/08/2016 0419   HGBUR NEGATIVE 05/01/2016 0419   BILIRUBINUR NEGATIVE 04/23/2016 0419   KETONESUR NEGATIVE 04/28/2016 0419   PROTEINUR NEGATIVE 05/07/2016 0419   NITRITE NEGATIVE 04/22/2016 0419   LEUKOCYTESUR NEGATIVE 05/14/2016 0419   Sepsis Labs: @LABRCNTIP (procalcitonin:4,lacticidven:4) )No results found for this or any previous visit (from the past 240 hour(s)).   Radiological Exams on Admission: Dg Chest 2 View  Result Date: 05/11/2016 CLINICAL DATA:  82 year old female with fall.  History of COPD. EXAM: CHEST  2 VIEW COMPARISON:  Chest radiograph dated 06/25/2015 FINDINGS: There is shallow inspiration. Bibasilar streaky densities most compatible with atelectatic changes/ scarring. Infiltrate is less likely. No pleural effusion or pneumothorax. Stable cardiac silhouette. The aorta is tortuous. There is osteopenia with degenerative changes of the spine. No acute osseous pathology. IMPRESSION: Shallow inspiration with bibasilar  atelectatic changes. Infiltrate is less likely. Electronically Signed   By: Elgie Collard M.D.   On: 05/05/2016 03:05   Ct Head Wo Contrast  Result Date: 05/11/2016 CLINICAL DATA:  82 year old female with fall and laceration to back of the head. EXAM: CT HEAD WITHOUT CONTRAST CT CERVICAL SPINE WITHOUT CONTRAST TECHNIQUE: Multidetector CT imaging of the head and cervical spine was performed following the standard protocol without intravenous contrast. Multiplanar CT image reconstructions of the cervical spine were also generated. COMPARISON:  Brain MRI dated 04/26/2016 and head CT dated 04/09/2016 FINDINGS: CT HEAD FINDINGS Brain: There is moderate age-related atrophy and chronic microvascular ischemic changes. Focal area of hypodensity in the left corona radiata extending from the left lateral ventricle inferiorly (series 201 images 16-20) appears new since the prior CT and may represent stop a subacute and less likely an acute infarct. Associated mild edema. Correlation with clinical exam recommended. MRI is recommended if there is clinical concern for an acute infarct. There is no acute intracranial hemorrhage. No mass effect or midline shift noted. Vascular: No hyperdense vessel or unexpected calcification. Skull: Normal. Negative for fracture or focal lesion. Sinuses/Orbits: There is partial opacification of multiple ethmoid air cells. No air-fluid levels. There is opacification of the mastoid air cells bilaterally. Other: Small right forehead scalp contusion. CT CERVICAL SPINE FINDINGS Alignment: Normal. Skull base and vertebrae: No acute fracture. No primary bone lesion or focal pathologic process. Soft tissues and spinal canal: No prevertebral fluid or swelling. No visible canal hematoma. Disc levels: Multilevel degenerative changes with mild facet hypertrophy. Upper chest: Negative. Other: None IMPRESSION: No acute intracranial hemorrhage. Focal area of hypodensity extending from the lateral aspect  of the left lateral ventricle inferiorly appears new since the prior CT and may represent a subacute or less likely an acute infarct. Correlation with clinical exam recommended. MRI is recommended if there is clinical concern for acute infarct. Moderate age-related atrophy and chronic microvascular ischemic changes. No acute/ traumatic cervical spine pathology. Electronically Signed   By: Elgie Collard M.D.   On: 05/14/2016 03:38   Ct Cervical Spine Wo Contrast  Result Date: 04/19/2016 CLINICAL DATA:  82 year old female with fall and laceration to back of the head. EXAM: CT HEAD WITHOUT CONTRAST CT CERVICAL SPINE WITHOUT CONTRAST TECHNIQUE: Multidetector CT imaging of the head and cervical spine was performed following the standard protocol without intravenous contrast. Multiplanar CT image reconstructions of the cervical spine were also generated. COMPARISON:  Brain MRI dated 04/26/2016 and head CT dated 04/09/2016 FINDINGS: CT  HEAD FINDINGS Brain: There is moderate age-related atrophy and chronic microvascular ischemic changes. Focal area of hypodensity in the left corona radiata extending from the left lateral ventricle inferiorly (series 201 images 16-20) appears new since the prior CT and may represent stop a subacute and less likely an acute infarct. Associated mild edema. Correlation with clinical exam recommended. MRI is recommended if there is clinical concern for an acute infarct. There is no acute intracranial hemorrhage. No mass effect or midline shift noted. Vascular: No hyperdense vessel or unexpected calcification. Skull: Normal. Negative for fracture or focal lesion. Sinuses/Orbits: There is partial opacification of multiple ethmoid air cells. No air-fluid levels. There is opacification of the mastoid air cells bilaterally. Other: Small right forehead scalp contusion. CT CERVICAL SPINE FINDINGS Alignment: Normal. Skull base and vertebrae: No acute fracture. No primary bone lesion or focal  pathologic process. Soft tissues and spinal canal: No prevertebral fluid or swelling. No visible canal hematoma. Disc levels: Multilevel degenerative changes with mild facet hypertrophy. Upper chest: Negative. Other: None IMPRESSION: No acute intracranial hemorrhage. Focal area of hypodensity extending from the lateral aspect of the left lateral ventricle inferiorly appears new since the prior CT and may represent a subacute or less likely an acute infarct. Correlation with clinical exam recommended. MRI is recommended if there is clinical concern for acute infarct. Moderate age-related atrophy and chronic microvascular ischemic changes. No acute/ traumatic cervical spine pathology. Electronically Signed   By: Elgie Collard M.D.   On: 05/03/2016 03:38   Mr Brain Wo Contrast  Result Date: 05/07/2016 CLINICAL DATA:  Fall with right-sided weakness. EXAM: MRI HEAD WITHOUT CONTRAST TECHNIQUE: Multiplanar, multiecho pulse sequences of the brain and surrounding structures were obtained without intravenous contrast. COMPARISON:  Head CT 04/30/2016 Brain MR 04/26/2016 FINDINGS: Brain: There are areas of diffusion restriction within the left caudate head, extending into the putamen; the left frontal operculum; and of the subinsular white matter. The abnormality is in close proximity to the posterior limb of the internal capsule. There is also a possible focus of diffusion restriction within the left cerebral peduncle. No acute hemorrhage. There is beginning confluent hyperintense T2-weighted signal within the periventricular white matter, most often seen in the setting of chronic microvascular ischemia. No mass lesion or midline shift. No hydrocephalus or extra-axial fluid collection. The midline structures are normal. No age advanced or lobar predominant atrophy. Vascular: Major intracranial arterial and venous sinus flow voids are preserved. No evidence of chronic microhemorrhage or amyloid angiopathy. Skull and  upper cervical spine: The visualized skull base, calvarium, upper cervical spine and extracranial soft tissues are normal. Sinuses/Orbits: Bilateral mastoid effusions. Paranasal sinuses are clear. Normal orbits. IMPRESSION: 1. Acute infarct within the left subinsular white matter, left frontal operculum and left basal ganglia (predominantly the caudate head and putamen). No hemorrhage, midline shift or mass effect. 2. Punctate focus of possible ischemia within the left cerebral peduncle. 3. Chronic microvascular disease. 4. Bilateral mastoid effusions. Electronically Signed   By: Deatra Robinson M.D.   On: 05/07/2016 05:39   Dg Knee Complete 4 Views Right  Result Date: 04/22/2016 CLINICAL DATA:  82 year old female with fall and right knee swelling. EXAM: RIGHT KNEE - COMPLETE 4+ VIEW COMPARISON:  None. FINDINGS: No acute fracture or dislocation. The bones are osteopenic. There is meniscal chondrocalcinosis with mild osteoarthritic changes of the knees. No joint effusion. The soft tissues appear unremarkable. IMPRESSION: No acute fracture or dislocation. Electronically Signed   By: Elgie Collard M.D.   On:  May 24, 2016 03:04    EKG: (Independently reviewed) sinus rhythm with ventricular rate 85 bpm, QTC 501 ms in setting of underlying left bundle branch block  Assessment/Plan Active Problems:   Recent Embolic stroke (HCC) - B MCA and L cerebellar/Cerebrovascular accident (CVA) due to bilateral thrombosis of vertebral arteries  -Patient recently admitted for acute stroke requiring tPA as well as complete revascularization of occluded left MCA, left ACA and left ICA terminus in interventional radiology. Baseline deficits included continued right lower extremity weakness with associated gait disturbance and mild to moderate right upper extremity weakness. -Patient returns with worsening right-sided symptoms with right-side now flaccid with findings of new left side multiple area strokes on MRI -In addition  to motor deficits patient has expressive aphasia -Formal neurological consultation pending -Continue full dose aspirin for CVA prophylaxis as recommended during previous admission-defer to neurology whether need to add or change to Plavix -Underwent TEE previous admission with no embolic source found; no indication to repeat echocardiogram this admission -Full stroke evaluation last admission so no indication to repeat carotid duplex, hemoglobin A1c or lipid panel this admission -Frequent neurological checks -History of dysphagia and given "gurgling sounds" in upper airway will continue NPO until formally evaluated by speech therapy  -PT/OT/SLP evaluation    Hypokalemia -Potassium to maintenance fluids    Hypertension -Allow for permissive hypertension -During previous admission patient had difficulties with suboptimal blood pressure prompting holding of decreased dosage ARB at time of discharge    Chronic combined systolic and diastolic CHF, NYHA class 1  -Euvolemic and given cool extremities with color changes suspect patient may actually be hypovolemic so will utilize gentle IV fluid hydration and hold diuretics -TEE previous admission revealed EF of 45-50% with grade 1 diastolic dysfunction -Discharge documentation states previous dry weight 170 pounds with discharge weight 163 pounds    Chronic obstructive pulmonary disease  -Reported is O2 dependent on chronic 2 L oxygen -O2 saturation stable on 2 L at this juncture -No wheezing -prn nebs    Hyperlipidemia -Holding Lipitor while NPO    Dysphagia -Documented on D3 diet at time of discharge      DVT prophylaxis: Lovenox Code Status: DO NOT RESUSCITATE Family Communication: No family at bedside at time of admission-defer to neurology re: specifics regarding prognosis and neurological plan of care not yet documented Disposition Plan: Return to [facility] Consults called: Neurology/Kirkpatrick    ELLIS,ALLISON L.  ANP-BC [facility] Pager [phone]   If 7PM-7AM, please contact night-coverage [email or web address] Password [facility]  24-May-2016, 7:40 AM

## 2016-05-09 NOTE — Progress Notes (Signed)
Unable to get adequate IV access for CT perfusion. After speaking with the son, likely abnormal much earlier in the day yesterday, in this setting, do not think that further IA therapy can be offered acutely. Will get MRA head and neck.   Ritta SlotMcNeill Tanisha Lutes, MD [facility] [phone]  If 7pm- 7am, please page neurology on call as listed in AMION.

## 2016-05-09 NOTE — Consult Note (Signed)
Neurology Consultation Reason for Consult: Right weakness Referring Physician: Melynda RippleHobbs  CC: Right-sided weakness  History is obtained from: Patient  HPI: Catherine Day is a 82 y.o. female with a history of recent left carotid stenting and embolectomy following left MCA stroke. She did well following the stroke, and went to a nursing home without aphasia. Yesterday, she fell around 11:30 PM. Last seen well was earlier in the evening, but no clear time was documented in the notes. An MRI obtained showed infarcts in subcortical left hemisphere.   LKW: 12/22 unclear time tpa given?: no, unclear time of onset    ROS: Unable to obtain due to aphasia  Past Medical History:  Diagnosis Date  . COPD (chronic obstructive pulmonary disease) (HCC)   . Stroke [facility](HCC)      Family history: Unable to obtain due to aphasia   Social History:  reports that she has never smoked. She has never used smokeless tobacco. She reports that she does not drink alcohol or use drugs.   Exam: Current vital signs: BP 123/61 (BP Location: Left Arm)   Pulse 86   Temp 98 F (36.7 C) (Oral)   Resp 18   Ht 5\' 2"  (1.575 m)   Wt 74.8 kg (165 lb)   SpO2 98%   BMI 30.18 kg/m  Vital signs in last 24 hours: Temp:  [97.6 F (36.4 C)-98.2 F (36.8 C)] 98 F (36.7 C) (12/23 1400) Pulse Rate:  [80-124] 86 (12/23 1400) Resp:  [16-39] 18 (12/23 1400) BP: (117-138)/(48-88) 123/61 (12/23 1400) SpO2:  [79 %-100 %] 98 % (12/23 1400) Weight:  [73.9 kg (163 lb)-74.8 kg (165 lb)] 74.8 kg (165 lb) (12/23 0836)   Physical Exam  Constitutional: Appears elderly Psych: Affect appropriate to situation Eyes: No scleral injection HENT: No OP obstrucion Head: Normocephalic.  Cardiovascular: Normal rate and regular rhythm.  Respiratory: Effort normal and breath sounds normal to anterior ascultation GI: Soft.  No distension. There is no tenderness.  Skin: WDI  Neuro: Mental Status: Patient is awake, alert, she has a fairly  severe receptive aphasia, but does count fingers correctly in all 4 visual fields. She does not follow any other commands, nor does she answer any other questions. Cranial Nerves: II: Visual Fields are full. Pupils are equal, round, and reactive to light.   III,IV, VI: EOMI without ptosis or diploplia.  V: Facial sensation is decreased on right VII: Facial movement is decreased on right VIII: hearing is intact to voice X: Uvula elevates symmetrically XI: Shoulder shrug is symmetric. XII: tongue is midline without atrophy or fasciculations.  Motor: She has a severe flaccid right hemiparesis, moves left side well Sensory: She appears to respond less on the right than the left Cerebellar: She does not perform    I have reviewed labs in epic and the results pertinent to this consultation are: Creatinine 0.8  I have reviewed the images obtained: MRI brain-subcortical infarcts, paucity of flow voids on the left  Impression: 82 year old female with acute onset right hemiparesis and aphasia. I am concerned that she may have reoccluded her carotid or embolized distally. Given that her deficits seem out of proportion to her MRI findings, I think a CT perfusion may be indicated given that we're within 24 hours onset.  Recommendations: 1. HgbA1c, fasting lipid panel 2. CT angiogram/perfusion of the head  3. Frequent neuro checks 4. Echocardiogram 5. CT angiogram of the neck 6. Prophylactic therapy-Antiplatelet med: Aspirin - dose 325mg  PO or 300mg  PR 7. Risk  factor modification 8. Telemetry monitoring 9. PT consult, OT consult, Speech consult 10. please page stroke NP  Or  PA  Or MD  M-F from 8am -4 pm starting 12/24 as this patient will be followed by the stroke team at this point.   You can look them up on [email or web address]     Ritta SlotMcNeill Maresa Morash, MD [facility] [phone]  If 7pm- 7am, please page neurology on call as listed in AMION.

## 2016-05-09 NOTE — ED Triage Notes (Signed)
Pt arrived via EMS from [facility] after a fall.

## 2016-05-09 NOTE — Progress Notes (Addendum)
  [name] is a 82 year old pmh COPD, CVA with residual asphasia and right-sided weakness in 03/2016; who [facility]. Patient suffered right frontal hematoma. NH staff noted worsening of previous right-sided deficits. VS afebrile, pulse 81-124, respirations 16-39 , blood pressure 126/70, and O2 saturations maintained on room air. Lab work was significant for WBC 11.8, hemoglobin 11.7, and all other labs relatively within normal limits. MRI reveals acute infarct within the left subinsular white matter, left frontal operculum, and left basal ganglia. Neurology to be consulted by ED physician. Inpatient admission to telemetry bed.

## 2016-05-09 NOTE — Evaluation (Signed)
Clinical/Bedside Swallow Evaluation Patient Details  Name: Catherine Day MRN: [id number] Date of Birth: 03/17/1928  Today's Date: 03-18-2016 Time: SLP Start Time (ACUTE ONLY): 11910905 SLP Stop Time (ACUTE ONLY): 0919 SLP Time Calculation (min) (ACUTE ONLY): 14 min  Past Medical History:  Past Medical History:  Diagnosis Date  . COPD (chronic obstructive pulmonary disease) (HCC)   . Stroke [facility](HCC)    Past Surgical History:  Past Surgical History:  Procedure Laterality Date  . IR GENERIC HISTORICAL  04/09/2016   IR ANGIO INTRA EXTRACRAN SEL COM CAROTID INNOMINATE UNI R MOD SED 04/09/2016 Julieanne CottonSanjeev Deveshwar, MD [facility]-INTERV RAD  . IR GENERIC HISTORICAL  04/09/2016   IR PERCUTANEOUS ART THROMBECTOMY/INFUSION INTRACRANIAL INC DIAG ANGIO 04/09/2016 Julieanne CottonSanjeev Deveshwar, MD [facility]-INTER[facility]  . PELVIC FRACTURE SURGERY    . RADIOLOGY WITH ANESTHESIA N/A 04/09/2016   Procedure: RADIOLOGY WITH ANESTHESIA;  Surgeon: Julieanne CottonSanjeev Deveshwar, MD;  Location: [facility] OR;  Service: Radiology;  Laterality: N/A;  . TEE WITHOUT CARDIOVERSION N/A 04/13/2016   Procedure: TRANSESOPHAGEAL ECHOCARDIOGRAM (TEE);  Surgeon: Thurmon FairMihai Croitoru, MD;  [facility];  Service: Cardiovascular;  Laterality: N/A;   HPI:  Catherine Day is a 82 y.o. female who presents to the Emergency Department on 2015/11/04 primarily non-verbal; Per nurse's note, "[place] [facility] [place] [facility]".  MRI head on 2015/11/04 indicated Acute infarct within the left subinsular white matter, left frontal operculum and left basal ganglia (predominantly the caudate head and putamen). No hemorrhage, midline shift or mass effect  Assessment / Plan / Recommendation Clinical Impression   Limited BSE d/t pt refusal; prior to BSE attempt, pt exhibited baseline wheezing and wet vocal quality; nectar-thickened liquids attempted via spoon and cup sip with vocal quality remaining the same, but multiple swallows noted; pt refused any other po intake after  nectar-thickened presentation despite encouragement from SLP; SLE was deferred d/t pt confusion/lethargy and inability and/or refusal to follow simple commands during BSE.  Pt should remain NPO at this time with ST f/u for po trials as pt is able. Medications via alternative means.    Aspiration Risk  Moderate aspiration risk    Diet Recommendation   NPO  Medication Administration: Via alternative means    Other  Recommendations Oral Care Recommendations: Oral care QID   Follow up Recommendations Skilled Nursing facility      Frequency and Duration min 2x/week  1 week       Prognosis Prognosis for Safe Diet Advancement: Fair Barriers to Reach Goals: Cognitive deficits;Severity of deficits      Swallow Study   General Date of Onset: 2015/11/04 HPI: Catherine Day is a 82 y.o. female who presents to the Emergency Department complaining of a fall that occurred ago. Per nurse's note, "[facility]".  Type of Study: Bedside Swallow Evaluation Previous Swallow Assessment:  (MBS on 04/17/16 upgraded to Dysphagia 3/nectar-thick liquids) Diet Prior to this Study: NPO Temperature Spikes Noted: No Respiratory Status: Nasal cannula History of Recent Intubation: Yes Length of Intubations (days): 1 days Date extubated: 04/10/16 (This was previous hospitalization, not current) Behavior/Cognition: Alert;Confused;Requires cueing;Distractible;Uncooperative Oral Cavity Assessment: Other (comment) (DTA d/t pt refusal) Oral Care Completed by SLP: Recent completion by staff Oral Cavity - Dentition: Poor condition Vision:  (DTA d/t refusal) Self-Feeding Abilities: Needs assist Patient Positioning: Upright in bed Baseline Vocal Quality: Wet;Low vocal intensity Volitional Cough: Cognitively unable to elicit Volitional Swallow: Unable to elicit    Oral/Motor/Sensory Function Overall  Oral Motor/Sensory Function: Other (comment) (DTA)   Ice Chips Ice  chips: Not tested   Thin Liquid Thin Liquid: Not tested    Nectar Thick Nectar Thick Liquid: Impaired Presentation: Cup;Self Fed;Spoon Pharyngeal Phase Impairments: Suspected delayed Swallow;Multiple swallows;Wet Vocal Quality   Honey Thick Honey Thick Liquid: Not tested   Puree Puree: Not tested Other Comments: Pt refused   Solid      Solid: Not tested Other Comments: Pt refused    Functional Assessment Tool Used: NOMS Functional Limitations: Swallowing Swallow Current Status ([id number]): At least 40 percent but less than 60 percent impaired, limited or restricted Swallow Goal Status [phone]): At least 20 percent but less than 40 percent impaired, limited or restricted   Josef Tourigny,PAT, M.S., CCC-SLP 2015/12/30,12:05 PM

## 2016-05-09 NOTE — ED Notes (Signed)
Patient transported to MRI 

## 2016-05-09 NOTE — ED Provider Notes (Signed)
[facility]-EMER[facility]ENCY DEPT Provider Note   CSN: [id number] Arrival date & time: 06/27/15  0121  By signing my name below, I, Catherine Day, attest that this documentation has been prepared under the direction and in the presence of Glynn OctaveStephen Dekker Verga, MD . Electronically Signed: Modena JanskyAlbert Day, Scribe. 10/07/15. 1:47 AM.  History   Chief Complaint No chief complaint on file.  The history is provided by medical records and the EMS personnel. The history is limited by the condition of the patient. No language interpreter was used.  LEVEL 5 CAVEAT DUE TO NONVERBAL PT  HPI Comments: Catherine Day is a 82 y.o. female who presents to the Emergency Department complaining of a fall that occurred ago. Per nurse's note, "pt arrived via EMS from [facility] after a fall". PAtient mostly aphasic and unable to give a history. She is able to state her name.  Recent admission for MCA stroke s/p tPA and clot retrieval.  ECF reports that patient seems to have increased weakness on the R side.  Unknown last known normal.  Discovered on floor at 2330.    Past Medical History:  Diagnosis Date  . COPD (chronic obstructive pulmonary disease) (HCC)   . Stroke [facility](HCC)     Patient Active Problem List   Diagnosis Date Noted  . Acute encephalopathy 04/23/2016  . Acute on chronic respiratory failure with hypoxia (HCC) 04/23/2016  . Acute on chronic combined systolic and diastolic CHF (congestive heart failure) (HCC) 04/22/2016  . UTI due to extended-spectrum beta lactamase (ESBL) producing Escherichia coli   . Hypocalcemia   . Aspiration pneumonitis (HCC)   . Chronic systolic CHF (congestive heart failure) (HCC)   . Cerebrovascular accident (CVA) due to bilateral thrombosis of vertebral arteries (HCC)   . Aspiration pneumonia (HCC) 04/15/2016  . Hypertension 04/14/2016  . Hyperlipidemia 04/14/2016  . Extravasation injury of IV catheter site with other complication, initial encounter (HCC) L arm 04/14/2016    . 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 Dysphagia 04/14/2016  . Diastolic dysfunction   . Chronic obstructive pulmonary disease (HCC)   . Acute blood loss anemia   . Leukocytosis   . Hypokalemia   . Embolic stroke (HCC) - B MCA and L cerebellar     Past Surgical History:  Procedure Laterality Date  . IR GENERIC HISTORICAL  04/09/2016   IR ANGIO INTRA EXTRACRAN SEL COM CAROTID INNOMINATE UNI R MOD SED 04/09/2016 Julieanne CottonSanjeev Deveshwar, MD [facility]-INTER[facility]  . IR GENERIC HISTORICAL  04/09/2016   IR PERCUTANEOUS ART THROMBECTOMY/INFUSION INTRACRANIAL INC DIAG ANGIO 04/09/2016 Julieanne CottonSanjeev Deveshwar, MD [facility]  . PELVIC FRACTURE SURGERY    . RADIOLOGY WITH ANESTHESIA N/A 04/09/2016   Procedure: RADIOLOGY WITH ANESTHESIA;  Surgeon: Julieanne CottonSanjeev Deveshwar, MD;  Location: [facility] OR;  Service: Radiology;  Laterality: N/A;  . TEE WITHOUT CARDIOVERSION N/A 04/13/2016   Procedure: TRANSESOPHAGEAL ECHOCARDIOGRAM (TEE);  Surgeon: Thurmon FairMihai Croitoru, MD;  [facility];  Service: Cardiovascular;  Laterality: N/A;    OB History    No data available       Home Medications    Prior to Admission medications   Medication Sig Start Date End Date Taking? Authorizing Provider  atorvastatin (LIPITOR) 40 MG tablet Take 1 tablet (40 mg total) by mouth daily at 6 PM. 04/27/16   Catarina Hartshornavid Tat, MD  Calcium Citrate-Vitamin D (CITRACAL + D PO) Take 1 tablet by mouth daily.    Historical Provider, MD  denosumab (PROLIA) 60 MG/ML SOLN injection Inject 60 mg into  the skin every 6 (six) months. Administer in upper arm, thigh, or abdomen Last injection 03/24/16    Historical Provider, MD  fluticasone furoate-vilanterol (BREO ELLIPTA) 100-25 MCG/INH AEPB Inhale 1 puff into the lungs daily.    Historical Provider, MD  furosemide (LASIX) 40 MG tablet Take 1 tablet (40 mg total) by mouth daily. 04/28/16   Catarina Hartshorn, MD  ipratropium-albuterol (DUONEB) 0.5-2.5 (3) MG/3ML SOLN Take 3 mLs by nebulization See  admin instructions. Inhale 1 vial by nebulization every morning and may also use 2 more times during the day as needed for shortness of breath or wheezing    Historical Provider, MD  levalbuterol (XOPENEX HFA) 45 MCG/ACT inhaler Inhale 1-2 puffs into the lungs every 4 (four) hours as needed for wheezing or shortness of breath.    Historical Provider, MD  OXYGEN Inhale 2 L into the lungs continuous.    Historical Provider, MD  predniSONE (DELTASONE) 50 MG tablet Take 1 tablet (50 mg total) by mouth daily with breakfast. And decrease by one tablet daily 04/28/16   Catarina Hartshorn, MD  tiotropium (SPIRIVA) 18 MCG inhalation capsule Place 18 mcg into inhaler and inhale daily.    Historical Provider, MD  tobramycin, PF, (TOBI) 300 MG/5ML nebulizer solution Take 300 mg by nebulization daily.    Historical Provider, MD  valsartan (DIOVAN) 40 MG tablet Take 1 tablet (40 mg total) by mouth daily. 04/28/16   Catarina Hartshorn, MD    Family History History reviewed. No pertinent family history.  Social History Social History  Substance Use Topics  . Smoking status: Never Smoker  . Smokeless tobacco: Never Used     Comment: quit 1970's  . Alcohol use No     Allergies   Patient has no known allergies.   Review of Systems Review of Systems  Unable to perform ROS: Patient nonverbal   Physical Exam Updated Vital Signs BP 117/60 (BP Location: Left Arm)   Pulse 87   Temp 97.6 F (36.4 C) (Oral)   Resp 16   Ht 5\' 2"  (1.575 m)   Wt 163 lb (73.9 kg)   SpO2 100%   BMI 29.81 kg/m   Physical Exam  Constitutional: She appears well-developed and well-nourished. No distress.  HENT:  Head: Normocephalic.  Mouth/Throat: Oropharynx is clear and moist. No oropharyngeal exudate.  Hematoma to right forehead.   Eyes: Conjunctivae and EOM are normal. Pupils are equal, round, and reactive to light.  Neck: Normal range of motion. Neck supple.  No C-spine TTP. No meningismus.  Cardiovascular: Normal rate, regular  rhythm, normal heart sounds and intact distal pulses.   No murmur heard. Pulmonary/Chest: Effort normal and breath sounds normal. No respiratory distress.  Abdominal: Soft. There is no tenderness. There is no rebound and no guarding.  Musculoskeletal: Normal range of motion. She exhibits no edema or tenderness.  Neurological: She is alert. No cranial nerve deficit. She exhibits abnormal muscle tone. Coordination abnormal.  Right sided weakness, essentially flaccid. Unable to hold up R arm or leg.   CN 2-12 intact. Unable to grip on R. but otherwise nonverbal. Seems to have receptive aphasia.  LUE and LLE 5/5 strength.  Skin: Skin is warm.  Old appearing ecchymosis to abdomen and right knee.  Psychiatric: She has a normal mood and affect. Her behavior is normal.  Nursing note and vitals reviewed.    ED Treatments / Results  DIAGNOSTIC STUDIES: Oxygen Saturation is 100% on RA, normal by my interpretation.    COORDINATION  OF CARE: 1:51 AM- Pt advised of plan for treatment and pt agrees.  Labs (all labs ordered are listed, but only abnormal results are displayed) Labs Reviewed  CBC WITH DIFFERENTIAL/PLATELET - Abnormal; Notable for the following:       Result Value   WBC 11.8 (*)    Hemoglobin 11.7 (*)    RDW 16.0 (*)    Neutro Abs 9.4 (*)    All other components within normal limits  COMPREHENSIVE METABOLIC PANEL - Abnormal; Notable for the following:    Chloride 98 (*)    Glucose, Bld 127 (*)    Calcium 8.0 (*)    Albumin 3.0 (*)    All other components within normal limits  URINALYSIS, ROUTINE W REFLEX MICROSCOPIC - Abnormal; Notable for the following:    APPearance HAZY (*)    All other components within normal limits  TROPONIN I  MAGNESIUM  PHOSPHORUS  PROTIME-INR  CALCIUM, IONIZED    EKG  EKG Interpretation  Date/Time:  Saturday May 09 2016 02:05:06 EST Ventricular Rate:  85 PR Interval:    QRS Duration: 125 QT Interval:  421 QTC Calculation: 501 R  Axis:   -18 Text Interpretation:  Sinus rhythm Atrial premature complex Left bundle branch block No significant change was found Artifact Confirmed by Manus Gunning  MD, Corretta Munce [phone]) on 05/04/2016 2:36:23 AM       Radiology Dg Chest 2 View  Result Date: 05/12/2016 CLINICAL DATA:  82 year old female with fall.  History of COPD. EXAM: CHEST  2 VIEW COMPARISON:  Chest radiograph dated 06/25/2015 FINDINGS: There is shallow inspiration. Bibasilar streaky densities most compatible with atelectatic changes/ scarring. Infiltrate is less likely. No pleural effusion or pneumothorax. Stable cardiac silhouette. The aorta is tortuous. There is osteopenia with degenerative changes of the spine. No acute osseous pathology. IMPRESSION: Shallow inspiration with bibasilar atelectatic changes. Infiltrate is less likely. Electronically Signed   By: Elgie Collard M.D.   On: 04/23/2016 03:05   Ct Head Wo Contrast  Result Date: 04/17/2016 CLINICAL DATA:  82 year old female with fall and laceration to back of the head. EXAM: CT HEAD WITHOUT CONTRAST CT CERVICAL SPINE WITHOUT CONTRAST TECHNIQUE: Multidetector CT imaging of the head and cervical spine was performed following the standard protocol without intravenous contrast. Multiplanar CT image reconstructions of the cervical spine were also generated. COMPARISON:  Brain MRI dated 04/26/2016 and head CT dated 04/09/2016 FINDINGS: CT HEAD FINDINGS Brain: There is moderate age-related atrophy and chronic microvascular ischemic changes. Focal area of hypodensity in the left corona radiata extending from the left lateral ventricle inferiorly (series 201 images 16-20) appears new since the prior CT and may represent stop a subacute and less likely an acute infarct. Associated mild edema. Correlation with clinical exam recommended. MRI is recommended if there is clinical concern for an acute infarct. There is no acute intracranial hemorrhage. No mass effect or midline shift noted.  Vascular: No hyperdense vessel or unexpected calcification. Skull: Normal. Negative for fracture or focal lesion. Sinuses/Orbits: There is partial opacification of multiple ethmoid air cells. No air-fluid levels. There is opacification of the mastoid air cells bilaterally. Other: Small right forehead scalp contusion. CT CERVICAL SPINE FINDINGS Alignment: Normal. Skull base and vertebrae: No acute fracture. No primary bone lesion or focal pathologic process. Soft tissues and spinal canal: No prevertebral fluid or swelling. No visible canal hematoma. Disc levels: Multilevel degenerative changes with mild facet hypertrophy. Upper chest: Negative. Other: None IMPRESSION: No acute intracranial hemorrhage. Focal area of hypodensity  extending from the lateral aspect of the left lateral ventricle inferiorly appears new since the prior CT and may represent a subacute or less likely an acute infarct. Correlation with clinical exam recommended. MRI is recommended if there is clinical concern for acute infarct. Moderate age-related atrophy and chronic microvascular ischemic changes. No acute/ traumatic cervical spine pathology. Electronically Signed   By: Elgie Collard M.D.   On: Jun 08, 2016 03:38   Ct Cervical Spine Wo Contrast  Result Date: 06-08-2016 CLINICAL DATA:  82 year old female with fall and laceration to back of the head. EXAM: CT HEAD WITHOUT CONTRAST CT CERVICAL SPINE WITHOUT CONTRAST TECHNIQUE: Multidetector CT imaging of the head and cervical spine was performed following the standard protocol without intravenous contrast. Multiplanar CT image reconstructions of the cervical spine were also generated. COMPARISON:  Brain MRI dated 04/26/2016 and head CT dated 04/09/2016 FINDINGS: CT HEAD FINDINGS Brain: There is moderate age-related atrophy and chronic microvascular ischemic changes. Focal area of hypodensity in the left corona radiata extending from the left lateral ventricle inferiorly (series 201 images  16-20) appears new since the prior CT and may represent stop a subacute and less likely an acute infarct. Associated mild edema. Correlation with clinical exam recommended. MRI is recommended if there is clinical concern for an acute infarct. There is no acute intracranial hemorrhage. No mass effect or midline shift noted. Vascular: No hyperdense vessel or unexpected calcification. Skull: Normal. Negative for fracture or focal lesion. Sinuses/Orbits: There is partial opacification of multiple ethmoid air cells. No air-fluid levels. There is opacification of the mastoid air cells bilaterally. Other: Small right forehead scalp contusion. CT CERVICAL SPINE FINDINGS Alignment: Normal. Skull base and vertebrae: No acute fracture. No primary bone lesion or focal pathologic process. Soft tissues and spinal canal: No prevertebral fluid or swelling. No visible canal hematoma. Disc levels: Multilevel degenerative changes with mild facet hypertrophy. Upper chest: Negative. Other: None IMPRESSION: No acute intracranial hemorrhage. Focal area of hypodensity extending from the lateral aspect of the left lateral ventricle inferiorly appears new since the prior CT and may represent a subacute or less likely an acute infarct. Correlation with clinical exam recommended. MRI is recommended if there is clinical concern for acute infarct. Moderate age-related atrophy and chronic microvascular ischemic changes. No acute/ traumatic cervical spine pathology. Electronically Signed   By: Elgie Collard M.D.   On: 2016-06-08 03:38   Mr Brain Wo Contrast  Result Date: Jun 08, 2016 CLINICAL DATA:  Fall with right-sided weakness. EXAM: MRI HEAD WITHOUT CONTRAST TECHNIQUE: Multiplanar, multiecho pulse sequences of the brain and surrounding structures were obtained without intravenous contrast. COMPARISON:  Head CT 2016-06-08 Brain MR 04/26/2016 FINDINGS: Brain: There are areas of diffusion restriction within the left caudate head, extending  into the putamen; the left frontal operculum; and of the subinsular white matter. The abnormality is in close proximity to the posterior limb of the internal capsule. There is also a possible focus of diffusion restriction within the left cerebral peduncle. No acute hemorrhage. There is beginning confluent hyperintense T2-weighted signal within the periventricular white matter, most often seen in the setting of chronic microvascular ischemia. No mass lesion or midline shift. No hydrocephalus or extra-axial fluid collection. The midline structures are normal. No age advanced or lobar predominant atrophy. Vascular: Major intracranial arterial and venous sinus flow voids are preserved. No evidence of chronic microhemorrhage or amyloid angiopathy. Skull and upper cervical spine: The visualized skull base, calvarium, upper cervical spine and extracranial soft tissues are normal. Sinuses/Orbits: Bilateral mastoid  effusions. Paranasal sinuses are clear. Normal orbits. IMPRESSION: 1. Acute infarct within the left subinsular white matter, left frontal operculum and left basal ganglia (predominantly the caudate head and putamen). No hemorrhage, midline shift or mass effect. 2. Punctate focus of possible ischemia within the left cerebral peduncle. 3. Chronic microvascular disease. 4. Bilateral mastoid effusions. Electronically Signed   By: Deatra RobinsonKevin  Herman M.D.   On: 08-Oct-2015 05:39   Dg Knee Complete 4 Views Right  Result Date: 08-Oct-2015 CLINICAL DATA:  82 year old female with fall and right knee swelling. EXAM: RIGHT KNEE - COMPLETE 4+ VIEW COMPARISON:  None. FINDINGS: No acute fracture or dislocation. The bones are osteopenic. There is meniscal chondrocalcinosis with mild osteoarthritic changes of the knees. No joint effusion. The soft tissues appear unremarkable. IMPRESSION: No acute fracture or dislocation. Electronically Signed   By: Elgie CollardArash  Radparvar M.D.   On: 08-Oct-2015 03:04    Procedures Procedures (including  critical care time)  Medications Ordered in ED Medications - No data to display   Initial Impression / Assessment and Plan / ED Course  I have reviewed the triage vital signs and the nursing notes.  Pertinent labs & imaging results that were available during my care of the patient were reviewed by me and considered in my medical decision making (see chart for details).  Clinical Course   Patient from nursing home after unwitnessed fall. She is nonverbal from previous stroke and has right-sided weakness. She has a large hematoma to her head. She is not anticoagulated.  Patient unable to give a history. Nursing home reports R sided weakness seems worse.  Unknown last seen normal.  Code stroke not activated. Per neuro discharge exam on 11/28 she had "no aphasia and 4/5 strength throughout".  CT negative for hemorrhage but does show areas of apparent acute infarct which are confirmed on MRI.  D/w Dr. Roseanne RenoStewart of neurology who will evaluate.  D/w Dr. Katrinka BlazingSmith for hospitalist admission. Final Clinical Impressions(s) / ED Diagnoses   Final diagnoses:  Cerebrovascular accident (CVA), unspecified mechanism (HCC)  Fall, initial encounter    New Prescriptions New Prescriptions   No medications on file   I personally performed the services described in this documentation, which was scribed in my presence. The recorded information has been reviewed and is accurate.     Glynn OctaveStephen Virlan Kempker, MD 02-01-16 [id number]

## 2016-05-10 ENCOUNTER — Inpatient Hospital Stay (HOSPITAL_COMMUNITY): Payer: Medicare Other

## 2016-05-10 DIAGNOSIS — G9340 Encephalopathy, unspecified: Secondary | ICD-10-CM

## 2016-05-10 DIAGNOSIS — I639 Cerebral infarction, unspecified: Secondary | ICD-10-CM

## 2016-05-10 DIAGNOSIS — I5042 Chronic combined systolic (congestive) and diastolic (congestive) heart failure: Secondary | ICD-10-CM

## 2016-05-10 DIAGNOSIS — R1312 Dysphagia, oropharyngeal phase: Secondary | ICD-10-CM

## 2016-05-10 DIAGNOSIS — I519 Heart disease, unspecified: Secondary | ICD-10-CM

## 2016-05-10 DIAGNOSIS — J410 Simple chronic bronchitis: Secondary | ICD-10-CM

## 2016-05-10 LAB — CBC
HCT: 36.6 % (ref 36.0–46.0)
Hemoglobin: 11.7 g/dL — ABNORMAL LOW (ref 12.0–15.0)
MCH: 27.5 pg (ref 26.0–34.0)
MCHC: 32 g/dL (ref 30.0–36.0)
MCV: 85.9 fL (ref 78.0–100.0)
PLATELETS: 220 10*3/uL (ref 150–400)
RBC: 4.26 MIL/uL (ref 3.87–5.11)
RDW: 15.8 % — ABNORMAL HIGH (ref 11.5–15.5)
WBC: 11.6 10*3/uL — AB (ref 4.0–10.5)

## 2016-05-10 LAB — BLOOD GAS, ARTERIAL
Acid-Base Excess: 6 mmol/L — ABNORMAL HIGH (ref 0.0–2.0)
Bicarbonate: 29.9 mmol/L — ABNORMAL HIGH (ref 20.0–28.0)
Drawn by: 301361
FIO2: 40
O2 SAT: 96.7 %
PATIENT TEMPERATURE: 98.6
pCO2 arterial: 42.3 mmHg (ref 32.0–48.0)
pH, Arterial: 7.463 — ABNORMAL HIGH (ref 7.350–7.450)
pO2, Arterial: 87.7 mmHg (ref 83.0–108.0)

## 2016-05-10 LAB — BASIC METABOLIC PANEL
Anion gap: 10 (ref 5–15)
BUN: 16 mg/dL (ref 6–20)
CO2: 29 mmol/L (ref 22–32)
Calcium: 7.8 mg/dL — ABNORMAL LOW (ref 8.9–10.3)
Chloride: 99 mmol/L — ABNORMAL LOW (ref 101–111)
Creatinine, Ser: 0.81 mg/dL (ref 0.44–1.00)
GFR calc Af Amer: >60 mL/min (ref >60)
Glucose, Bld: 108 mg/dL — ABNORMAL HIGH (ref 65–99)
POTASSIUM: 3.8 mmol/L (ref 3.5–5.1)
SODIUM: 138 mmol/L (ref 135–145)

## 2016-05-10 LAB — CALCIUM, IONIZED: CALCIUM, IONIZED, SERUM: 4.6 mg/dL (ref 4.5–5.6)

## 2016-05-10 LAB — GLUCOSE, CAPILLARY: GLUCOSE-CAPILLARY: 83 mg/dL (ref 65–99)

## 2016-05-10 LAB — MRSA PCR SCREENING: MRSA BY PCR: POSITIVE — AB

## 2016-05-10 LAB — AMMONIA: AMMONIA: 15 umol/L (ref 9–35)

## 2016-05-10 MED ORDER — IPRATROPIUM-ALBUTEROL 0.5-2.5 (3) MG/3ML IN SOLN
3.0000 mL | Freq: Four times a day (QID) | RESPIRATORY_TRACT | Status: DC
  Administered 2016-05-10 – 2016-05-19 (×33): 3 mL via RESPIRATORY_TRACT
  Filled 2016-05-10 (×31): qty 3

## 2016-05-10 MED ORDER — GADOBENATE DIMEGLUMINE 529 MG/ML IV SOLN
14.0000 mL | Freq: Once | INTRAVENOUS | Status: AC | PRN
  Administered 2016-05-10: 14 mL via INTRAVENOUS

## 2016-05-10 MED ORDER — MUPIROCIN 2 % EX OINT
1.0000 "application " | TOPICAL_OINTMENT | Freq: Two times a day (BID) | CUTANEOUS | Status: AC
  Administered 2016-05-10 – 2016-05-15 (×10): 1 via NASAL
  Filled 2016-05-10 (×2): qty 22

## 2016-05-10 MED ORDER — CHLORHEXIDINE GLUCONATE CLOTH 2 % EX PADS
6.0000 | MEDICATED_PAD | Freq: Every day | CUTANEOUS | Status: AC
  Administered 2016-05-11 – 2016-05-15 (×5): 6 via TOPICAL

## 2016-05-10 MED ORDER — PIPERACILLIN-TAZOBACTAM 3.375 G IVPB
3.3750 g | Freq: Three times a day (TID) | INTRAVENOUS | Status: DC
Start: 2016-05-10 — End: 2016-05-15
  Administered 2016-05-10 – 2016-05-15 (×16): 3.375 g via INTRAVENOUS
  Filled 2016-05-10 (×20): qty 50

## 2016-05-10 NOTE — Progress Notes (Signed)
Patient was transferred to 4E14 by MD order. Report was called to nurse who got the patient. Patient's family was informed about her transfer.

## 2016-05-10 NOTE — Progress Notes (Addendum)
[facility]                                                                              Patient Demographics  Catherine Day, is a 82 y.o. female, DOB - [birth year], ZOX:[id number]  Admit date - 2016/05/11   Admitting Physician Haydee Salter, MD  Outpatient Primary MD for the patient is No primary care provider on file.  Outpatient specialists:   LOS - 1  days    Chief Complaint  Patient presents with  . Fall       Brief summary   Patient is a 82 year old female with recent protracted hospitalization for left-sided embolic stroke, COPD, O2 dependent, prior hospitalization course complicated by ESBL positive Escherichia coli UTI and aspiration pneumonia. She was discharged to skilled nursing facility.   Patient was sent to the ER after a fall. She was last seen normal earlier in the evening without specific time given. Per ER notes the patient fell about 11:30 PM and at that time it was discovered that the patient was not moving the right side as per previous baseline. MRI showed acute infarcts within the left subinsular white matter, left frontal operculum the left basal ganglia with an area of punctate possible ischemia in the left cerebral peduncle.   Assessment & Plan    Acute CVA in the setting of Recent Embolic stroke (HCC) - B MCA and L cerebellar/Cerebrovascular accident (CVA) due to bilateral thrombosis of vertebral arteries  -Patient recently admitted for acute stroke requiring tPA as well as complete revascularization of occluded left MCA, left ACA and left ICA terminus in interventional radiology. Baseline deficits included continued right lower extremity weakness with associated gait disturbance and mild to moderate right upper extremity weakness. -Patient now admitted with right-sided hemiparesis, flaccid with findings of new left side multiple area strokes on MRI, also has expressive aphasia -Appreciate neurology recommendations. MRA of the brain showed  occlusion of the left M2 division, acute infarct on MRIs predominantly in the lateral lenticular striate territory.  -Continue full dose aspirin PR for CVA prophylaxis, failed swallow evaluation -Underwent TEE previous admission with no embolic source found; no indication to repeat echocardiogram this admission -Full stroke evaluation last admission so no indication to repeat carotid duplex, hemoglobin A1c or lipid panel this admission -Frequent neurological checks -PTOT evaluation pending - Speech evaluation-> NPO - d/w Dr Lucia Gaskins, repeat CT head to r/o any bleeding   Acute encephalopathy with hypoxic respiratory failure -On examination today, lethargic, difficult to arouse, Diffuse rhonchi. O2 sats 100% on 5 L O2  - Stat ABG, ammonia level, chest x-ray ordered.  - ABG with pH 7.4, PCO2 42.3, ammonia level XV, WBCs 11.6, chest x-ray showed bronchitic changes with bibasilar atelectasis - Due to concern for impending respiratory failure due to aspiration, placed on IV Zosyn, transferred to stepdown unit     Hypertension -Allow for permissive hypertension    Chronic combined systolic and diastolic CHF, NYHA class 1  -Euvolemic, hold diuretics -TEE previous admission revealed EF of 45-50% with grade 1 diastolic dysfunction -Discharge documentation states previous dry weight 170 pounds with discharge weight 163 pounds  Chronic obstructive pulmonary disease with chronic respiratory failure -Reported is O2 dependent on chronic 2 L oxygen - Placed on DuoNeb's    Hyperlipidemia -Holding Lipitor while NPO    Dysphagia Failed swallow evaluation   Code Status: dnr  DVT Prophylaxis:  Lovenox  Family Communication: No family member at the bedside   Disposition Plan: Transfer to stepdown  Time Spent in minutes   25* minutes  Procedures:  MRI brain MRA brain   Consultants:   Neurology  Antimicrobials:   IV Zosyn 12/24   Medications  Scheduled Meds: .  stroke: mapping our  early stages of recovery book   Does not apply Once  . aspirin  300 mg Rectal Daily   Or  . aspirin  325 mg Oral Daily  . enoxaparin (LOVENOX) injection  40 mg Subcutaneous Q24H  . fluticasone furoate-vilanterol  1 puff Inhalation Daily  . ipratropium-albuterol  3 mL Nebulization Daily  . piperacillin-tazobactam (ZOSYN)  IV  3.375 g Intravenous Q8H  . tiotropium  18 mcg Inhalation Daily   Continuous Infusions: PRN Meds:.acetaminophen **OR** acetaminophen (TYLENOL) oral liquid 160 mg/5 mL **OR** acetaminophen, ipratropium-albuterol, senna-docusate   Antibiotics   Anti-infectives    Start     Dose/Rate Route Frequency Ordered Stop   05/10/16 0900  piperacillin-tazobactam (ZOSYN) IVPB 3.375 g     3.375 g 12.5 mL/hr over 240 Minutes Intravenous Every 8 hours 05/10/16 0859          Subjective:   Quadasia Newsham was seen and examined today. Difficult to arouse, diffuse rhonchi, opens eyes. Vital signs stable, unable to obtain review of systems from the patient.    Objective:   Vitals:   05/10/16 0200 05/10/16 0405 05/10/16 0851 05/10/16 0852  BP: 140/80 (!) 141/77  (!) 153/67  Pulse: 74 74  75  Resp: (!) 22 18  20   Temp: 98.2 F (36.8 C) 98.2 F (36.8 C)  98.8 F (37.1 C)  TempSrc: Oral Oral  Axillary  SpO2: 95% 95% 99% 99%  Weight:      Height:        Intake/Output Summary (Last 24 hours) at 05/10/16 1134 Last data filed at 05/10/16 0801  Gross per 24 hour  Intake           591.25 ml  Output                0 ml  Net           591.25 ml     Wt Readings from Last 3 Encounters:  05/12/2016 74.8 kg (165 lb)  04/27/16 74.1 kg (163 lb 5.8 oz)  04/14/16 91.2 kg (201 lb)     Exam  General: Difficult to arouse, opens eyes on loud voice name and shaking   HEENT:    Neck: Supple, no JVD  Cardiovascular: S1 S2 auscultated, no rubs, murmurs or gallops. Regular rate and rhythm.  Respiratory:Diffuse rhonchi bilaterally   Gastrointestinal: Soft, nontender,  nondistended, + bowel sounds  Ext: no cyanosis clubbing or edema  Neuro: not cooperative, does not follow commands   Skin: No rashes  Psych: does not follow commands, confused    Data Reviewed:  I have personally reviewed following labs and imaging studies  Micro Results No results found for this or any previous visit (from the past 240 hour(s)).  Radiology Reports Dg Chest 2 View  Result Date: 05/12/2016 CLINICAL DATA:  82 year old female with fall.  History of COPD. EXAM: CHEST  2 VIEW COMPARISON:  Chest radiograph dated 06/25/2015 FINDINGS: There is shallow inspiration. Bibasilar streaky densities most compatible with atelectatic changes/ scarring. Infiltrate is less likely. No pleural effusion or pneumothorax. Stable cardiac silhouette. The aorta is tortuous. There is osteopenia with degenerative changes of the spine. No acute osseous pathology. IMPRESSION: Shallow inspiration with bibasilar atelectatic changes. Infiltrate is less likely. Electronically Signed   By: Elgie Collard M.D.   On: 05/15/2016 03:05   Ct Head Wo Contrast  Result Date: 05/08/2016 CLINICAL DATA:  82 year old female with fall and laceration to back of the head. EXAM: CT HEAD WITHOUT CONTRAST CT CERVICAL SPINE WITHOUT CONTRAST TECHNIQUE: Multidetector CT imaging of the head and cervical spine was performed following the standard protocol without intravenous contrast. Multiplanar CT image reconstructions of the cervical spine were also generated. COMPARISON:  Brain MRI dated 04/26/2016 and head CT dated 04/09/2016 FINDINGS: CT HEAD FINDINGS Brain: There is moderate age-related atrophy and chronic microvascular ischemic changes. Focal area of hypodensity in the left corona radiata extending from the left lateral ventricle inferiorly (series 201 images 16-20) appears new since the prior CT and may represent stop a subacute and less likely an acute infarct. Associated mild edema. Correlation with clinical exam  recommended. MRI is recommended if there is clinical concern for an acute infarct. There is no acute intracranial hemorrhage. No mass effect or midline shift noted. Vascular: No hyperdense vessel or unexpected calcification. Skull: Normal. Negative for fracture or focal lesion. Sinuses/Orbits: There is partial opacification of multiple ethmoid air cells. No air-fluid levels. There is opacification of the mastoid air cells bilaterally. Other: Small right forehead scalp contusion. CT CERVICAL SPINE FINDINGS Alignment: Normal. Skull base and vertebrae: No acute fracture. No primary bone lesion or focal pathologic process. Soft tissues and spinal canal: No prevertebral fluid or swelling. No visible canal hematoma. Disc levels: Multilevel degenerative changes with mild facet hypertrophy. Upper chest: Negative. Other: None IMPRESSION: No acute intracranial hemorrhage. Focal area of hypodensity extending from the lateral aspect of the left lateral ventricle inferiorly appears new since the prior CT and may represent a subacute or less likely an acute infarct. Correlation with clinical exam recommended. MRI is recommended if there is clinical concern for acute infarct. Moderate age-related atrophy and chronic microvascular ischemic changes. No acute/ traumatic cervical spine pathology. Electronically Signed   By: Elgie Collard M.D.   On: 05/03/2016 03:38   Ct Cervical Spine Wo Contrast  Result Date: 04/29/2016 CLINICAL DATA:  82 year old female with fall and laceration to back of the head. EXAM: CT HEAD WITHOUT CONTRAST CT CERVICAL SPINE WITHOUT CONTRAST TECHNIQUE: Multidetector CT imaging of the head and cervical spine was performed following the standard protocol without intravenous contrast. Multiplanar CT image reconstructions of the cervical spine were also generated. COMPARISON:  Brain MRI dated 04/26/2016 and head CT dated 04/09/2016 FINDINGS: CT HEAD FINDINGS Brain: There is moderate age-related atrophy and  chronic microvascular ischemic changes. Focal area of hypodensity in the left corona radiata extending from the left lateral ventricle inferiorly (series 201 images 16-20) appears new since the prior CT and may represent stop a subacute and less likely an acute infarct. Associated mild edema. Correlation with clinical exam recommended. MRI is recommended if there is clinical concern for an acute infarct. There is no acute intracranial hemorrhage. No mass effect or midline shift noted. Vascular: No hyperdense vessel or unexpected calcification. Skull: Normal. Negative for fracture or focal lesion. Sinuses/Orbits: There is partial opacification of multiple ethmoid air cells. No air-fluid levels. There is  opacification of the mastoid air cells bilaterally. Other: Small right forehead scalp contusion. CT CERVICAL SPINE FINDINGS Alignment: Normal. Skull base and vertebrae: No acute fracture. No primary bone lesion or focal pathologic process. Soft tissues and spinal canal: No prevertebral fluid or swelling. No visible canal hematoma. Disc levels: Multilevel degenerative changes with mild facet hypertrophy. Upper chest: Negative. Other: None IMPRESSION: No acute intracranial hemorrhage. Focal area of hypodensity extending from the lateral aspect of the left lateral ventricle inferiorly appears new since the prior CT and may represent a subacute or less likely an acute infarct. Correlation with clinical exam recommended. MRI is recommended if there is clinical concern for acute infarct. Moderate age-related atrophy and chronic microvascular ischemic changes. No acute/ traumatic cervical spine pathology. Electronically Signed   By: Elgie CollardArash  Radparvar M.D.   On: 05/05/2016 03:38   Mr Maxine GlennMra Head Wo Contrast  Result Date: 05/10/2016 CLINICAL DATA:  Acute left MCA infarct. EXAM: MRA NECK WITHOUT AND WITH CONTRAST MRA HEAD WITHOUT CONTRAST TECHNIQUE: Multiplanar and multiecho pulse sequences of the neck were obtained without  and with intravenous contrast. Angiographic images of the neck were obtained using MRA technique without and with intravenous contast.; Angiographic images of the Circle of Willis were obtained using MRA technique without intravenous contrast. CONTRAST:  14mL MULTIHANCE GADOBENATE DIMEGLUMINE 529 MG/ML IV SOLN COMPARISON:  None. FINDINGS: MRA NECK FINDINGS The study is moderately motion degraded mainly in the lower neck/ upper chest. Presumed standard 3 vessel aortic arch branching, though the origin of the left vertebral artery is not established on this study due to artifact. The brachiocephalic and subclavian arteries are grossly patent. However, motion artifact completely obscures the region of the distal brachiocephalic artery and proximal right common carotid artery. Artifact through this level also obscures a portion of the mid left common carotid artery as well as both proximal V1 segments. The portions of the common carotid arteries which are well-visualized are widely patent. The internal carotid arteries are patent bilaterally without evidence of significant stenosis. The distal right cervical ICA is tortuous. The vertebral arteries are patent and codominant. There is no evidence of flow limiting stenosis in the V2 or V3 segments. There are areas of mild-to-moderate narrowing versus artifact in the V3 segments. The V1 segments are not well evaluated. MRA HEAD FINDINGS The study is mildly motion degraded. The visualized distal vertebral arteries are patent to the basilar and codominant. Right PICA origin is patent. Left PICA is not identified, nor are AICAs. Basilar artery is widely patent. SCA origins are patent. There is a fetal type origin of the left PCA with absent left P1. The PCAs are patent without evidence of significant proximal stenosis. Internal carotid arteries are patent from skullbase to carotid termini without evidence of significant stenosis. ACAs and right MCA are patent without evidence  of major branch occlusion or flow limiting proximal stenosis. The left M1 segment is patent with at most mild narrowing distally. There is occlusion of the left M2 inferior division at its origin. There is mild reconstitution of flow and some more distal branches. Flow is present in the left M2 superior division, though it has an attenuated appearance. No intracranial aneurysm is identified. IMPRESSION: 1. Occlusion of the left M2 inferior division. Note that the acute infarct on yesterday's MRI is predominantly in the lateral lenticulostriate territory however. 2. Motion degraded neck MRA with limited assessment of the large vessels in the upper chest and lower neck. No evidence of flow limiting cervical carotid or  vertebral artery stenosis within this limitation. Electronically Signed   By: Sebastian Ache M.D.   On: 05/10/2016 08:08   Mr Maxine Glenn Head Wo Contrast  Result Date: 04/10/2016 CLINICAL DATA:  82 year old female status post neuro intervention on 04/09/2016 for acute onset right side weakness and inability to speak. Status post Bilateral cerebral angiogram with Revascularization of occluded LT MCA ,Lt ACA and Lt ICA terminus with x1 PASS WITH A COMBINATION OF solitaire 4mm x 40 mm retrieval device with simultaneous aspiration at the intermediate guide catheter and flow gate guide catheter, and 3.6 mg of superselective intracranial INTEGRELIN. Initial encounter. EXAM: MRI HEAD WITHOUT CONTRAST MRA HEAD WITHOUT CONTRAST TECHNIQUE: Multiplanar, multiecho pulse sequences of the brain and surrounding structures were obtained without intravenous contrast. Angiographic images of the head were obtained using MRA technique without contrast. COMPARISON:  Post tPA head CT 04/09/2016 FINDINGS: MRI HEAD FINDINGS Brain: Multiple small cortically based and white-matter foci of restricted diffusion in the left temporal lobe, best seen on axial diffusion weighted imaging (series 4, image 20). Evidence of petechial  hemorrhage in the left mesial temporal lobe which appears to track to the left cauda thalamic groove where diffusion is also heterogeneous. These areas demonstrate mild T2 and FLAIR hyperintensity without mass effect. In addition there are several other widely scattered mostly cortically based foci of restricted diffusion in both MCA territories at over the superior convexities. Similar scattered small infarcts in the left occipital lobe. One or 2 such areas in the left cerebellum. No other intracranial hemorrhage. No intracranial mass effect or ventriculomegaly. Superimposed patchy bilateral cerebral white matter T2 and FLAIR hyperintensity. No chronic cortical encephalomalacia. Right deep gray matter nuclei and brainstem are normal for age. Negative pituitary and cervicomedullary junction. Vascular: Major intracranial vascular flow voids are preserved. Skull and upper cervical spine: Negative. Normal bone marrow signal. Sinuses/Orbits: Normal orbits soft tissues. Trace paranasal sinus mucosal thickening. Mild mastoid effusions. Other: Negative scalp soft tissues. MRA HEAD FINDINGS Mildly motion degraded. Antegrade flow in the posterior circulation with mildly dominant distal right vertebral artery. Normal PICA origins. Patent vertebrobasilar junction. Patent basilar artery without stenosis. Patent SCA and normal right PCA origins. Fetal type left PCA origin. Right posterior communicating artery diminutive or absent. Absent flow signal in the inferior left PCA P3 division. Otherwise the bilateral PCA branches appear normal. Antegrade flow in both ICA siphons with some generalized ICA dolichoectasia and tortuosity of the distal cervical right ICA. Patent carotid termini. Normal MCA and ACA origins. Generalized ACA dolichoectasia. Proximal ACA branches appear normal. Both MCA bifurcations appear patent. MCA branch detail is degraded by motion. Fairly symmetric appearance of bilateral MCA branch flow signal.  IMPRESSION: 1. Small volume of scattered acute infarcts in the left hemisphere primarily in the left temporal and occipital lobes (note fetal type left PCA origin anatomy). 2. Petechial hemorrhage in the mesial left temporal lobe tracking to the cauda thalamic groove. No associated mass effect and no malignant hemorrhagic transformation. 3. A small number of other scattered small infarcts in the bilateral MCA and left cerebellar artery territories might be related to endovascular intervention. 4. Motion degraded intracranial MRA negative for emergent large vessel occlusion or proximal intracranial stenosis. Possible occlusion of the left PCA P3 inferior division. Electronically Signed   By: Odessa Fleming M.D.   On: 04/10/2016 19:16   Mr Maxine Glenn Neck W Wo Contrast  Result Date: 05/10/2016 CLINICAL DATA:  Acute left MCA infarct. EXAM: MRA NECK WITHOUT AND WITH CONTRAST MRA HEAD WITHOUT  CONTRAST TECHNIQUE: Multiplanar and multiecho pulse sequences of the neck were obtained without and with intravenous contrast. Angiographic images of the neck were obtained using MRA technique without and with intravenous contast.; Angiographic images of the Circle of Willis were obtained using MRA technique without intravenous contrast. CONTRAST:  14mL MULTIHANCE GADOBENATE DIMEGLUMINE 529 MG/ML IV SOLN COMPARISON:  None. FINDINGS: MRA NECK FINDINGS The study is moderately motion degraded mainly in the lower neck/ upper chest. Presumed standard 3 vessel aortic arch branching, though the origin of the left vertebral artery is not established on this study due to artifact. The brachiocephalic and subclavian arteries are grossly patent. However, motion artifact completely obscures the region of the distal brachiocephalic artery and proximal right common carotid artery. Artifact through this level also obscures a portion of the mid left common carotid artery as well as both proximal V1 segments. The portions of the common carotid arteries which  are well-visualized are widely patent. The internal carotid arteries are patent bilaterally without evidence of significant stenosis. The distal right cervical ICA is tortuous. The vertebral arteries are patent and codominant. There is no evidence of flow limiting stenosis in the V2 or V3 segments. There are areas of mild-to-moderate narrowing versus artifact in the V3 segments. The V1 segments are not well evaluated. MRA HEAD FINDINGS The study is mildly motion degraded. The visualized distal vertebral arteries are patent to the basilar and codominant. Right PICA origin is patent. Left PICA is not identified, nor are AICAs. Basilar artery is widely patent. SCA origins are patent. There is a fetal type origin of the left PCA with absent left P1. The PCAs are patent without evidence of significant proximal stenosis. Internal carotid arteries are patent from skullbase to carotid termini without evidence of significant stenosis. ACAs and right MCA are patent without evidence of major branch occlusion or flow limiting proximal stenosis. The left M1 segment is patent with at most mild narrowing distally. There is occlusion of the left M2 inferior division at its origin. There is mild reconstitution of flow and some more distal branches. Flow is present in the left M2 superior division, though it has an attenuated appearance. No intracranial aneurysm is identified. IMPRESSION: 1. Occlusion of the left M2 inferior division. Note that the acute infarct on yesterday's MRI is predominantly in the lateral lenticulostriate territory however. 2. Motion degraded neck MRA with limited assessment of the large vessels in the upper chest and lower neck. No evidence of flow limiting cervical carotid or vertebral artery stenosis within this limitation. Electronically Signed   By: Sebastian Ache M.D.   On: 05/10/2016 08:08   Mr Brain Wo Contrast  Result Date: 05-17-2016 CLINICAL DATA:  Fall with right-sided weakness. EXAM: MRI HEAD  WITHOUT CONTRAST TECHNIQUE: Multiplanar, multiecho pulse sequences of the brain and surrounding structures were obtained without intravenous contrast. COMPARISON:  Head CT 05/17/2016 Brain MR 04/26/2016 FINDINGS: Brain: There are areas of diffusion restriction within the left caudate head, extending into the putamen; the left frontal operculum; and of the subinsular white matter. The abnormality is in close proximity to the posterior limb of the internal capsule. There is also a possible focus of diffusion restriction within the left cerebral peduncle. No acute hemorrhage. There is beginning confluent hyperintense T2-weighted signal within the periventricular white matter, most often seen in the setting of chronic microvascular ischemia. No mass lesion or midline shift. No hydrocephalus or extra-axial fluid collection. The midline structures are normal. No age advanced or lobar predominant atrophy. Vascular:  Major intracranial arterial and venous sinus flow voids are preserved. No evidence of chronic microhemorrhage or amyloid angiopathy. Skull and upper cervical spine: The visualized skull base, calvarium, upper cervical spine and extracranial soft tissues are normal. Sinuses/Orbits: Bilateral mastoid effusions. Paranasal sinuses are clear. Normal orbits. IMPRESSION: 1. Acute infarct within the left subinsular white matter, left frontal operculum and left basal ganglia (predominantly the caudate head and putamen). No hemorrhage, midline shift or mass effect. 2. Punctate focus of possible ischemia within the left cerebral peduncle. 3. Chronic microvascular disease. 4. Bilateral mastoid effusions. Electronically Signed   By: Deatra Robinson M.D.   On: 05-12-2016 05:39   Mr Brain Wo Contrast  Result Date: 04/26/2016 CLINICAL DATA:  Worsening hearing loss over the past 4-5 days. Stroke last month. EXAM: MRI HEAD WITHOUT CONTRAST TECHNIQUE: Multiplanar, multiecho pulse sequences of the brain and surrounding  structures were obtained without intravenous contrast. COMPARISON:  04/10/2016 FINDINGS: Brain: Small volume blood products are again seen in the mesial left temporal lobe extending to the region of inferior basal ganglia/ genu of the left internal capsule. Minimal residual diffusion signal abnormality is present in these locations related to the blood products. Abnormal trace diffusion signal elsewhere in the left greater than right cerebral hemispheres and left cerebellum on the prior study has resolved. There is minimal T2/FLAIR hyperintensity in the mesial left temporal lobe/ basal ganglia region and left temporal occipital region corresponding to the previously demonstrated infarcts. There is no evidence of acute infarct, new intracranial hemorrhage, mass, midline shift, or extra-axial fluid collection. Bilateral periventricular white matter T2 hyperintensities are unchanged and nonspecific but compatible with mild chronic small vessel ischemic disease. There is moderate cerebral atrophy. Vascular: Major intracranial vascular flow voids are preserved. Skull and upper cervical spine: Unremarkable bone marrow signal. Sinuses/Orbits: Unremarkable orbits. Minimal posterior right ethmoid air cell mucosal thickening. Large bilateral mastoid effusions, increased from prior. Fluid is now present in the middle ear cavities bilaterally. Other: None. IMPRESSION: 1. Residual small volume blood products in the mesial left temporal lobe and left internal capsule/inferior basal ganglia region associated with subacute infarcts demonstrated on the prior MRI. 2. Resolved diffusion abnormality associated with the small infarcts elsewhere. 3. No acute infarct or new intracranial abnormality. 4. Large bilateral mastoid and middle ear effusions. Electronically Signed   By: Sebastian Ache M.D.   On: 04/26/2016 19:03   Mr Brain Wo Contrast  Result Date: 04/10/2016 CLINICAL DATA:  82 year old female status post neuro intervention on  04/09/2016 for acute onset right side weakness and inability to speak. Status post Bilateral cerebral angiogram with Revascularization of occluded LT MCA ,Lt ACA and Lt ICA terminus with x1 PASS WITH A COMBINATION OF solitaire 4mm x 40 mm retrieval device with simultaneous aspiration at the intermediate guide catheter and flow gate guide catheter, and 3.6 mg of superselective intracranial INTEGRELIN. Initial encounter. EXAM: MRI HEAD WITHOUT CONTRAST MRA HEAD WITHOUT CONTRAST TECHNIQUE: Multiplanar, multiecho pulse sequences of the brain and surrounding structures were obtained without intravenous contrast. Angiographic images of the head were obtained using MRA technique without contrast. COMPARISON:  Post tPA head CT 04/09/2016 FINDINGS: MRI HEAD FINDINGS Brain: Multiple small cortically based and white-matter foci of restricted diffusion in the left temporal lobe, best seen on axial diffusion weighted imaging (series 4, image 20). Evidence of petechial hemorrhage in the left mesial temporal lobe which appears to track to the left cauda thalamic groove where diffusion is also heterogeneous. These areas demonstrate mild T2 and FLAIR hyperintensity  without mass effect. In addition there are several other widely scattered mostly cortically based foci of restricted diffusion in both MCA territories at over the superior convexities. Similar scattered small infarcts in the left occipital lobe. One or 2 such areas in the left cerebellum. No other intracranial hemorrhage. No intracranial mass effect or ventriculomegaly. Superimposed patchy bilateral cerebral white matter T2 and FLAIR hyperintensity. No chronic cortical encephalomalacia. Right deep gray matter nuclei and brainstem are normal for age. Negative pituitary and cervicomedullary junction. Vascular: Major intracranial vascular flow voids are preserved. Skull and upper cervical spine: Negative. Normal bone marrow signal. Sinuses/Orbits: Normal orbits soft tissues.  Trace paranasal sinus mucosal thickening. Mild mastoid effusions. Other: Negative scalp soft tissues. MRA HEAD FINDINGS Mildly motion degraded. Antegrade flow in the posterior circulation with mildly dominant distal right vertebral artery. Normal PICA origins. Patent vertebrobasilar junction. Patent basilar artery without stenosis. Patent SCA and normal right PCA origins. Fetal type left PCA origin. Right posterior communicating artery diminutive or absent. Absent flow signal in the inferior left PCA P3 division. Otherwise the bilateral PCA branches appear normal. Antegrade flow in both ICA siphons with some generalized ICA dolichoectasia and tortuosity of the distal cervical right ICA. Patent carotid termini. Normal MCA and ACA origins. Generalized ACA dolichoectasia. Proximal ACA branches appear normal. Both MCA bifurcations appear patent. MCA branch detail is degraded by motion. Fairly symmetric appearance of bilateral MCA branch flow signal. IMPRESSION: 1. Small volume of scattered acute infarcts in the left hemisphere primarily in the left temporal and occipital lobes (note fetal type left PCA origin anatomy). 2. Petechial hemorrhage in the mesial left temporal lobe tracking to the cauda thalamic groove. No associated mass effect and no malignant hemorrhagic transformation. 3. A small number of other scattered small infarcts in the bilateral MCA and left cerebellar artery territories might be related to endovascular intervention. 4. Motion degraded intracranial MRA negative for emergent large vessel occlusion or proximal intracranial stenosis. Possible occlusion of the left PCA P3 inferior division. Electronically Signed   By: Odessa Fleming M.D.   On: 04/10/2016 19:16   Dg Chest Port 1 View  Result Date: 05/06/2016 CLINICAL DATA:  Shortness of Breath following recent fall EXAM: PORTABLE CHEST 1 VIEW COMPARISON:  05/04/2016 FINDINGS: Cardiac shadow is stable. Bibasilar atelectatic changes are noted slightly more  prominent than that seen on the prior exam. No pneumothorax or sizable effusion is seen. No bony abnormality is noted. IMPRESSION: Mild bibasilar atelectasis. Electronically Signed   By: Alcide Clever M.D.   On: 05/05/2016 21:56   Dg Chest Port 1 View  Result Date: 04/23/2016 CLINICAL DATA:  Aspiration pneumonia EXAM: PORTABLE CHEST 1 VIEW COMPARISON:  04/23/2016 at 12:20 FINDINGS: Mild streaky opacity in the right days. Left lung is clear. Pulmonary vasculature is normal. No effusions. Hilar, mediastinal and cardiac contours are unremarkable and unchanged. IMPRESSION: Streaky right base opacity. No focal confluent consolidation. No significant change in the interim. Electronically Signed   By: Ellery Plunk M.D.   On: 04/23/2016 23:41   Dg Chest Port 1 View  Result Date: 04/23/2016 CLINICAL DATA:  COPD, aspiration pneumonia. EXAM: PORTABLE CHEST 1 VIEW COMPARISON:  Portable chest x-ray of Sep 20, 2015 FINDINGS: The lungs are mildly hyperinflated. The interstitial markings are coarse. Increased density at the right lung base is consistent with the clinically suspected aspiration pneumonia. The heart and pulmonary vascularity are normal. There is calcification in the wall of the tortuous thoracic aorta. There is no pleural effusion. The bony  thorax exhibits no acute abnormality. IMPRESSION: Right basilar pneumonia consistent with aspiration. COPD. Thoracic aortic atherosclerosis. When the patient can tolerate the procedure, a PA and lateral chest x-ray would be useful. Electronically Signed   By: David  [place] M.D.   On: 04/23/2016 12:34   Dg Chest Port 1 View  Result Date: 04/21/2016 CLINICAL DATA:  Initial evaluation for acute respiratory distress, wheezing. History of COPD. EXAM: PORTABLE CHEST 1 VIEW COMPARISON:  Prior radiograph from 04/21/2016. FINDINGS: Cardiac and mediastinal silhouettes are stable in size and contour, and remain within normal limits. Atherosclerotic disease noted within the  aortic arch. Lungs are hypoinflated. Changes related COPD noted. Increased right basilar opacity favored to reflect atelectasis/bronchovascular crowding, although recurrent infiltrate not excluded. Left basilar atelectasis noted. Small left pleural effusion. No pulmonary edema or pneumothorax. No acute osseous abnormality. IMPRESSION: 1. Shallow lung inflation with increased right basilar opacity, favored to reflect atelectasis/ bronchovascular crowding, although recurrent infiltrate could be considered in the correct clinical setting. 2. Small left pleural effusion with associated atelectasis. 3. COPD. Electronically Signed   By: Rise Mu M.D.   On: 04/21/2016 20:38   Dg Chest Port 1 View  Result Date: 04/21/2016 CLINICAL DATA:  Follow-up wheezing, COPD. EXAM: PORTABLE CHEST 1 VIEW COMPARISON:  04/20/2016 FINDINGS: There is hyperinflation of the lungs compatible with COPD. Heart and mediastinal contours are within normal limits. No focal opacities or effusions. No acute bony abnormality. IMPRESSION: COPD.  No active disease. Electronically Signed   By: Charlett Nose M.D.   On: 04/21/2016 08:27   Dg Chest Port 1 View  Result Date: 04/20/2016 CLINICAL DATA:  Pneumonia. EXAM: PORTABLE CHEST 1 VIEW COMPARISON:  04/16/2016. FINDINGS: Mediastinum and hilar structures are normal. Heart size stable. Low lung volumes with basilar atelectasis. Slight improvement of right base infiltrate. A component of right base bronchiectasis cannot be excluded. Tiny left pleural effusion. No pneumothorax. IMPRESSION: 1. Interim partial clearing of right base infiltrate. A component of bronchiectasis in the right base cannot be excluded. 2. Low lung volumes.  Small left pleural effusion noted. Electronically Signed   By: Maisie Fus  Register   On: 04/20/2016 07:20   Dg Chest Port 1 View  Result Date: 04/16/2016 CLINICAL DATA:  Acute onset of shortness of breath. Initial encounter. EXAM: PORTABLE CHEST 1 VIEW COMPARISON:   Chest radiograph performed 04/14/2016 FINDINGS: Persistent right basilar airspace opacity remains concerning for pneumonia. No definite pleural effusion or pneumothorax is seen. Mild vascular congestion is noted. The cardiomediastinal silhouette is mildly enlarged. No acute osseous abnormalities are identified. IMPRESSION: 1. Persistent right basilar airspace opacity remains concerning for pneumonia. 2. Mild vascular congestion and mild cardiomegaly. Electronically Signed   By: Roanna Raider M.D.   On: 04/16/2016 06:26   Dg Chest Port 1 View  Result Date: 04/14/2016 CLINICAL DATA:  Acute onset of shortness of breath and cough. Initial encounter. EXAM: PORTABLE CHEST 1 VIEW COMPARISON:  Chest radiograph performed 04/11/2016 FINDINGS: Right basilar airspace opacity raises concern for pneumonia. No pleural effusion or pneumothorax is seen. The cardiomediastinal silhouette is borderline normal in size. No acute osseous abnormalities are identified. IMPRESSION: Right basilar pneumonia noted. Electronically Signed   By: Roanna Raider M.D.   On: 04/14/2016 21:50   Dg Chest Port 1 View  Result Date: 04/11/2016 CLINICAL DATA:  82 year old female with respiratory failure EXAM: PORTABLE CHEST 1 VIEW COMPARISON:  Prior chest x-ray 04/09/2016 FINDINGS: The patient has been extubated and the nasogastric tube removed. Inspiratory volumes are lower. Nonspecific  patchy opacities in the right greater than left base. Stable cardiomegaly. Mild pulmonary vascular congestion without edema. Atherosclerotic, ectatic and tortuous thoracic aorta. Right IJ approach central venous catheter remains in unchanged position. The tip of the catheter is at the superior cavoatrial junction. Osseous structures are intact and unremarkable. IMPRESSION: 1. Interval extubation and removal of nasogastric tube. Right IJ central venous catheter remains in stable and satisfactory position. 2. Lower inspiratory volumes with increased bibasilar  atelectasis and pulmonary vascular congestion. 3.  Aortic Atherosclerosis (ICD10-170.0) Electronically Signed   By: Malachy MoanHeath  McCullough M.D.   On: 04/11/2016 08:12   Dg Chest Port 1v Same Day  Result Date: 05/10/2016 CLINICAL DATA:  Dyspnea, history COPD, stroke EXAM: PORTABLE CHEST 1 VIEW COMPARISON:  Portable exam 0932 hours compared to 05/03/2016 FINDINGS: Upper normal heart size. Rotated to the RIGHT. Mediastinal contours and pulmonary vascularity normal. Atherosclerotic calcification aorta. Bronchitic changes with bibasilar atelectasis. Remaining lungs clear. No pleural effusion or pneumothorax. IMPRESSION: Bronchitic changes with bibasilar atelectasis. Aortic atherosclerosis. Electronically Signed   By: Ulyses SouthwardMark  Boles M.D.   On: 05/10/2016 09:53   Dg Knee Complete 4 Views Right  Result Date: 05/17/2016 CLINICAL DATA:  82 year old female with fall and right knee swelling. EXAM: RIGHT KNEE - COMPLETE 4+ VIEW COMPARISON:  None. FINDINGS: No acute fracture or dislocation. The bones are osteopenic. There is meniscal chondrocalcinosis with mild osteoarthritic changes of the knees. No joint effusion. The soft tissues appear unremarkable. IMPRESSION: No acute fracture or dislocation. Electronically Signed   By: Elgie CollardArash  Radparvar M.D.   On: 04/21/2016 03:04   Dg Swallowing Func-speech Pathology  Result Date: 04/17/2016 Objective Swallowing Evaluation: Type of Study: MBS-Modified Barium Swallow Study Patient Details Name: Hughie ClossRegina Giles MRN: [id number] Date of Birth: 12/22/1927 Today's Date: 04/17/2016 Time: SLP Start Time (ACUTE ONLY): 1045-SLP Stop Time (ACUTE ONLY): 1115 SLP Time Calculation (min) (ACUTE ONLY): 30 min Past Medical History: Past Medical History: Diagnosis Date . COPD (chronic obstructive pulmonary disease) (HCC)  Past Surgical History: Past Surgical History: Procedure Laterality Date . IR GENERIC HISTORICAL  04/09/2016  IR ANGIO INTRA EXTRACRAN SEL COM CAROTID INNOMINATE UNI R MOD SED 04/09/2016  Julieanne CottonSanjeev Deveshwar, MD [facility]-INTER[facility] RAD . IR GENERIC HISTORICAL  04/09/2016  IR PERCUTANEOUS ART THROMBECTOMY/INFUSION INTRACRANIAL INC DIAG ANGIO 04/09/2016 Julieanne CottonSanjeev Deveshwar, MD [facility] . PELVIC FRACTURE SURGERY   . RADIOLOGY WITH ANESTHESIA N/A 04/09/2016  Procedure: RADIOLOGY WITH ANESTHESIA;  Surgeon: Julieanne CottonSanjeev Deveshwar, MD;  Location: [facility] OR;  Service: Radiology;  Laterality: N/A; . TEE WITHOUT CARDIOVERSION N/A 04/13/2016  Procedure: TRANSESOPHAGEAL ECHOCARDIOGRAM (TEE);  Surgeon: Thurmon FairMihai Croitoru, MD;  [facility];  Service: Cardiovascular;  Laterality: N/A; HPI: 82 y.o. right handed female with h/o COPD. Presented 04/10/2016 with sudden onset of right-sided weakness and inability to speak. 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 Pt discharged to CIR 11/28, however, later that night she became dyspneic and diaphoretic. She was given albuterol nebs with some improvement. CXR with RLL infiltrate. Subjective: pt awake, pleasant Assessment / Plan / Recommendation CHL IP CLINICAL IMPRESSIONS 04/17/2016 Therapy Diagnosis Mild oral phase dysphagia;Mild pharyngeal phase dysphagia Clinical Impression Pt's swallow function demonstrates [facility]  She presents with piecemeal oral bolusing; persisting delay in initiation (valleculae and pyriforms) -however, this is not necessarily considered disordered in the elderly population.  There may have been questionable, high penetration of liquids, but it  was difficult to discern.  No aspiration. Pyriform sinuses appear to be asymmetric and there is mild residue remaining post-swallow. Recommend advancing diet to dysphagia 3, nectar-thick liquids for now.  Will follow for functional toleration/safety.   Impact on safety and function Mild aspiration risk   CHL IP TREATMENT RECOMMENDATION 04/17/2016  Treatment Recommendations Therapy as outlined in treatment plan below   Prognosis 04/17/2016 Prognosis for Safe Diet Advancement Good Barriers to Reach Goals -- Barriers/Prognosis Comment -- CHL IP DIET RECOMMENDATION 04/17/2016 SLP Diet Recommendations Dysphagia 3 (Mech soft) solids;Nectar thick liquid Liquid Administration via Cup Medication Administration Whole meds with puree Compensations Small sips/bites;Slow rate Postural Changes --   CHL IP OTHER RECOMMENDATIONS 04/17/2016 Recommended Consults -- Oral Care Recommendations Oral care BID Other Recommendations --   CHL IP FOLLOW UP RECOMMENDATIONS 04/17/2016 Follow up Recommendations Inpatient Rehab   CHL IP FREQUENCY AND DURATION 04/17/2016 Speech Therapy Frequency (ACUTE ONLY) min 2x/week Treatment Duration 2 weeks      CHL IP ORAL PHASE 04/17/2016 Oral Phase Impaired Oral - Pudding Teaspoon -- Oral - Pudding Cup -- Oral - Honey Teaspoon -- Oral - Honey Cup -- Oral - Nectar Teaspoon -- Oral - Nectar Cup -- Oral - Nectar Straw -- Oral - Thin Teaspoon -- Oral - Thin Cup -- Oral - Thin Straw -- Oral - Puree Piecemeal swallowing Oral - Mech Soft Piecemeal swallowing Oral - Regular -- Oral - Multi-Consistency -- Oral - Pill -- Oral Phase - Comment --  CHL IP PHARYNGEAL PHASE 04/17/2016 Pharyngeal Phase Impaired Pharyngeal- Pudding Teaspoon -- Pharyngeal -- Pharyngeal- Pudding Cup -- Pharyngeal -- Pharyngeal- Honey Teaspoon -- Pharyngeal -- Pharyngeal- Honey Cup NT Pharyngeal -- Pharyngeal- Nectar Teaspoon -- Pharyngeal -- Pharyngeal- Nectar Cup Pharyngeal residue - pyriform;Delayed swallow initiation-pyriform sinuses Pharyngeal Material enters airway, remains ABOVE vocal cords then ejected out Pharyngeal- Nectar Straw -- Pharyngeal -- Pharyngeal- Thin Teaspoon -- Pharyngeal -- Pharyngeal- Thin Cup Delayed swallow initiation-pyriform sinuses;Pharyngeal residue - pyriform Pharyngeal Material enters airway, remains ABOVE vocal cords then ejected out Pharyngeal- Thin Straw  -- Pharyngeal -- Pharyngeal- Puree Delayed swallow initiation-vallecula;Pharyngeal residue - pyriform Pharyngeal -- Pharyngeal- Mechanical Soft Delayed swallow initiation-vallecula;Pharyngeal residue - pyriform Pharyngeal -- Pharyngeal- Regular -- Pharyngeal -- Pharyngeal- Multi-consistency -- Pharyngeal -- Pharyngeal- Pill -- Pharyngeal -- Pharyngeal Comment --  CHL IP CERVICAL ESOPHAGEAL PHASE 04/12/2016 Cervical Esophageal Phase WFL Pudding Teaspoon -- Pudding Cup -- Honey Teaspoon -- Honey Cup -- Nectar Teaspoon -- Nectar Cup -- Nectar Straw -- Thin Teaspoon -- Thin Cup -- Thin Straw -- Puree -- Mechanical Soft -- Regular -- Multi-consistency -- Pill -- Cervical Esophageal Comment -- No flowsheet data found. Blenda Mounts Laurice 04/17/2016, 11:50 AM              Dg Swallowing Func-speech Pathology  Result Date: 04/12/2016 Objective Swallowing Evaluation: Type of Study: MBS-Modified Barium Swallow Study Patient Details Name: Mariaeduarda Defranco MRN: [id number] Date of Birth: 20-Dec-1927 Today's Date: 04/12/2016 Time: SLP Start Time (ACUTE ONLY): 1116-SLP Stop Time (ACUTE ONLY): 1131 SLP Time Calculation (min) (ACUTE ONLY): 15 min Past Medical History: Past Medical History: Diagnosis Date . COPD (chronic obstructive pulmonary disease) (HCC)  Past Surgical History: Past Surgical History: Procedure Laterality Date . PELVIC FRACTURE SURGERY   . RADIOLOGY WITH ANESTHESIA N/A 04/09/2016  Procedure: RADIOLOGY WITH ANESTHESIA;  Surgeon: Julieanne Cotton, MD;  Location: [facility] OR;  Service: Radiology;  Laterality: N/A; HPI: Ms Andreatta is an 82 yo female with h/o COPD admitted with mental status change, right sided  weakness, hemianopsia, left gaze preference,  Pt required intubation and was extubated yesterday.  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  Swallow and speech eval ordered Subjective: pt awake, pleasant Assessment / Plan /  Recommendation CHL IP CLINICAL IMPRESSIONS 04/12/2016 Therapy Diagnosis Moderate pharyngeal phase dysphagia Clinical Impression Pt has a moderate pharyngeal dysphagia due in large part to delayed swallow initiation and decreased sensation of airway penetration. Liquids spill to the pyriform sinuses, resulting in deep, silent penetration of thin and nectar thick liquids. Although this does not occur with every bolus, it is silent and unable to be completely cleared with cued coughing, making her at a higher risk for an aspiration-related infection. Her strength is adequate with no significant residuals remaining post-swallow. Recommend Dys 3 diet and honey thick liquids. SLP to f/u for tolerance and readiness to advance. Impact on safety and function Moderate aspiration risk   CHL IP TREATMENT RECOMMENDATION 04/12/2016 Treatment Recommendations Therapy as outlined in treatment plan below   Prognosis 04/12/2016 Prognosis for Safe Diet Advancement Good Barriers to Reach Goals -- Barriers/Prognosis Comment -- CHL IP DIET RECOMMENDATION 04/12/2016 SLP Diet Recommendations Dysphagia 3 (Mech soft) solids;Honey thick liquids Liquid Administration via Cup;No straw Medication Administration Whole meds with puree Compensations Slow rate;Small sips/bites Postural Changes Remain semi-upright after after feeds/meals (Comment);Seated upright at 90 degrees   CHL IP OTHER RECOMMENDATIONS 04/12/2016 Recommended Consults -- Oral Care Recommendations Oral care BID Other Recommendations Order thickener from pharmacy;Prohibited food (jello, ice cream, thin soups);Remove water pitcher   CHL IP FOLLOW UP RECOMMENDATIONS 04/12/2016 Follow up Recommendations Inpatient Rehab   CHL IP FREQUENCY AND DURATION 04/12/2016 Speech Therapy Frequency (ACUTE ONLY) min 2x/week Treatment Duration 2 weeks      CHL IP ORAL PHASE 04/12/2016 Oral Phase WFL Oral - Pudding Teaspoon -- Oral - Pudding Cup -- Oral - Honey Teaspoon -- Oral - Honey Cup -- Oral -  Nectar Teaspoon -- Oral - Nectar Cup -- Oral - Nectar Straw -- Oral - Thin Teaspoon -- Oral - Thin Cup -- Oral - Thin Straw -- Oral - Puree -- Oral - Mech Soft -- Oral - Regular -- Oral - Multi-Consistency -- Oral - Pill -- Oral Phase - Comment --  CHL IP PHARYNGEAL PHASE 04/12/2016 Pharyngeal Phase Impaired Pharyngeal- Pudding Teaspoon -- Pharyngeal -- Pharyngeal- Pudding Cup -- Pharyngeal -- Pharyngeal- Honey Teaspoon -- Pharyngeal -- Pharyngeal- Honey Cup Delayed swallow initiation-pyriform sinuses Pharyngeal -- Pharyngeal- Nectar Teaspoon -- Pharyngeal -- Pharyngeal- Nectar Cup Delayed swallow initiation-pyriform sinuses;Penetration/Aspiration before swallow Pharyngeal Material enters airway, remains ABOVE vocal cords and not ejected out Pharyngeal- Nectar Straw -- Pharyngeal -- Pharyngeal- Thin Teaspoon -- Pharyngeal -- Pharyngeal- Thin Cup Delayed swallow initiation-pyriform sinuses;Penetration/Aspiration before swallow Pharyngeal Material enters airway, CONTACTS cords and not ejected out Pharyngeal- Thin Straw -- Pharyngeal -- Pharyngeal- Puree Delayed swallow initiation-vallecula Pharyngeal -- Pharyngeal- Mechanical Soft Delayed swallow initiation-vallecula Pharyngeal -- Pharyngeal- Regular -- Pharyngeal -- Pharyngeal- Multi-consistency -- Pharyngeal -- Pharyngeal- Pill -- Pharyngeal -- Pharyngeal Comment --  CHL IP CERVICAL ESOPHAGEAL PHASE 04/12/2016 Cervical Esophageal Phase WFL Pudding Teaspoon -- Pudding Cup -- Honey Teaspoon -- Honey Cup -- Nectar Teaspoon -- Nectar Cup -- Nectar Straw -- Thin Teaspoon -- Thin Cup -- Thin Straw -- Puree -- Mechanical Soft -- Regular -- Multi-consistency -- Pill -- Cervical Esophageal Comment -- No flowsheet data found. [name] 04/12/2016, 1:05 PM  [name], M.A. CCC-SLP [phone]  Lab Data:  CBC:  Recent Labs Lab 05/07/2016 0307 05/10/16 0853  WBC 11.8* 11.6*  NEUTROABS 9.4*  --   HGB 11.7* 11.7*  HCT 36.2 36.6  MCV 86.6  85.9  PLT 196 220   Basic Metabolic Panel:  Recent Labs Lab 05/12/2016 0307 05/03/2016 0801 05/10/16 0853  NA 136  --  138  K 3.5  --  3.8  CL 98*  --  99*  CO2 29  --  29  GLUCOSE 127*  --  108*  BUN 15  --  16  CREATININE 0.83  --  0.81  CALCIUM 8.0*  --  7.8*  MG  --  1.8  --   PHOS  --  2.8  --    GFR: Estimated Creatinine Clearance: 47.2 mL/min (by C-G formula based on SCr of 0.81 mg/dL). Liver Function Tests:  Recent Labs Lab 04/21/2016 0307  AST 26  ALT 24  ALKPHOS 72  BILITOT 1.1  PROT 6.9  ALBUMIN 3.0*   No results for input(s): LIPASE, AMYLASE in the last 168 hours.  Recent Labs Lab 05/10/16 0853  AMMONIA 15   Coagulation Profile:  Recent Labs Lab 04/22/2016 0801  INR 1.10   Cardiac Enzymes:  Recent Labs Lab 04/30/2016 0307  TROPONINI <0.03   BNP (last 3 results) No results for input(s): PROBNP in the last 8760 hours. HbA1C: No results for input(s): HGBA1C in the last 72 hours. CBG: No results for input(s): GLUCAP in the last 168 hours. Lipid Profile: No results for input(s): CHOL, HDL, LDLCALC, TRIG, CHOLHDL, LDLDIRECT in the last 72 hours. Thyroid Function Tests: No results for input(s): TSH, T4TOTAL, FREET4, T3FREE, THYROIDAB in the last 72 hours. Anemia Panel: No results for input(s): VITAMINB12, FOLATE, FERRITIN, TIBC, IRON, RETICCTPCT in the last 72 hours. Urine analysis:    Component Value Date/Time   COLORURINE YELLOW 05/08/2016 0419   APPEARANCEUR HAZY (A) 04/26/2016 0419   LABSPEC 1.020 04/17/2016 0419   PHURINE 5.0 04/18/2016 0419   GLUCOSEU NEGATIVE 04/30/2016 0419   HGBUR NEGATIVE 04/28/2016 0419   BILIRUBINUR NEGATIVE 04/17/2016 0419   KETONESUR NEGATIVE 05/14/2016 0419   PROTEINUR NEGATIVE 04/22/2016 0419   NITRITE NEGATIVE 05/11/2016 0419   LEUKOCYTESUR NEGATIVE 05/12/2016 0419     RAI,RIPUDEEP M.D. [facility] 05/10/2016, 11:34 AM  Pager: [phone] Between 7am to 7pm - call Pager - [phone]  After  7pm go to [email or web address] - password [email or web address]  Call night coverage person covering after 7pm

## 2016-05-10 NOTE — Progress Notes (Signed)
Speech Language Pathology Treatment: Dysphagia  Patient Details Name: Catherine ClossRegina Day MRN: [id number] DOB: 09/04/1927 Today's Date: 05/10/2016 Time: [id number] SLP Time Calculation (min) (ACUTE ONLY): 15 min  Assessment / Plan / Recommendation Clinical Impression  F/u after yesterday's clinical swallow evaluation.  Pt with significant aphasia; non-verbal with no notable attempts to communicate; unable to follow commands, but could imitate tongue extension, smile.  Pt more willing today to accept PO trials of purees, ice chips, eliciting mild cough. She has a baseline dysphagia that was well documented in early December.  Functional performance was consistently worse than what  instrumental swallow study suggested. Deconditioning, poor endurance/fatigue, and respiratory effort created high risk for aspiration.  Today's performance suggests ongoing issues with airway protection.  Recommend continue NPO status; consider temporary enteral feedings; SLP will follow along for improvements/readiness.    HPI HPI: Catherine ClossRegina Day is a 82 y.o. female who presents to the Emergency Department complaining of a fall that occurred ago. Per nurse's note, "pt arrived via EMS from [facility] after a fall".       SLP Plan  Continue with current plan of care     Recommendations  Diet recommendations: NPO   Consider  cortrak if consistent with pt's directives             Oral Care Recommendations: Oral care QID Plan: Continue with current plan of care       GO               Vencil Basnett L. Samson Fredericouture, [place] CCC/SLP Pager [phone]  [name], Catherine Day 05/10/2016, 9:53 AM

## 2016-05-10 NOTE — Progress Notes (Signed)
RT scanned all of patients respiratory medications. However, pt is not able to do spiriva and breo due to AMS at this time. RT to get ABG.

## 2016-05-10 NOTE — Progress Notes (Signed)
OT Cancellation Note  Patient Details Name: Catherine Day MRN: [id number] DOB: [name]   Cancelled Treatment:    Reason Eval/Treat Not Completed: Medical issues which prohibited therapy   Felecia ShellingJones, Catherine Day   Catherine Day, Catherine Day   OTR/L Pager: [phone] Office: [phone] .  05/10/2016, 11:56 AM

## 2016-05-10 NOTE — Progress Notes (Signed)
PT Cancellation Note  Patient Details Name: Catherine ClossRegina Day MRN: [id number] DOB: 11/20/1927   Cancelled Treatment:    Reason Eval/Treat Not Completed: Fatigue/lethargy limiting ability to participate. Pt sleeping heavily, unable to arouse to participate in PT session. Nursing aware and reporting that pt may be transferring to step-down unit. PT to continue to follow.    Christiane HaBenjamin J. Kenlea Woodell, PT, CSCS Pager [phone] Office [phone]  05/10/2016, 11:48 AM

## 2016-05-10 NOTE — Progress Notes (Signed)
Pharmacy Antibiotic Note  Catherine ClossRegina Day is a 82 y.o. female admitted on 05/17/2016 for fall (recurrent stroke and worsened R side deficits) with aspiration pneumonia.  Pharmacy has been consulted for zosyn dosing. Afebrile, WBC elevated at 11.8.   Plan: Zosyn 3.375g IV q8h (4 hour infusion).  Monitor clinical picture, Tmax, CBC Follow up culture data as able, LOT   Height: 5\' 2"  (157.5 cm) Weight: 165 lb (74.8 kg) IBW/kg (Calculated) : 50.1  Temp (24hrs), Avg:98.6 F (37 C), Min:97.7 F (36.5 C), Max:100.9 F (38.3 C)   Recent Labs Lab 05/04/2016 0307  WBC 11.8*  CREATININE 0.83    Estimated Creatinine Clearance: 46.1 mL/min (by C-G formula based on SCr of 0.83 mg/dL).    No Known Allergies  Antimicrobials this admission: Zosyn 12/24  >>   Dose adjustments this admission: n/a  Microbiology results: None ordered  Thank you for allowing pharmacy to be a part of this patient's care.  Allena Katzaroline E Welles, Pharm.D. PGY1 Pharmacy Resident 12/24/20179:04 AM Pager [phone]

## 2016-05-10 NOTE — Progress Notes (Signed)
STROKE TEAM PROGRESS NOTE   HISTORY OF PRESENT ILLNESS (per record) Catherine ClossRegina Gerbino is a 82 y.o. female with a history of recent left carotid stenting and embolectomy following left MCA stroke. She did well following the stroke, and went to a nursing home without aphasia. Yesterday, she fell around 11:30 PM and was noted to be aphasic. Last seen well was earlier in the evening, but no clear time was documented in the notes. An MRI obtained showed infarcts in subcortical left hemisphere.   LKW: 12/22 unclear time tpa given?: no, unclear time of onset   SUBJECTIVE (INTERVAL HISTORY) No family is at bedside, patient is very lethargic this morning, moved to step down.   OBJECTIVE Temp:  [97.7 F (36.5 C)-100.9 F (38.3 C)] 98.8 F (37.1 C) (12/24 0852) Pulse Rate:  [74-88] 75 (12/24 0852) Cardiac Rhythm: Normal sinus rhythm (12/24 0755) Resp:  [18-22] 20 (12/24 0852) BP: (130-153)/(52-80) 153/67 (12/24 0852) SpO2:  [92 %-99 %] 92 % (12/24 1224)  CBC:   Recent Labs Lab 2015-07-10 0307 05/10/16 0853  WBC 11.8* 11.6*  NEUTROABS 9.4*  --   HGB 11.7* 11.7*  HCT 36.2 36.6  MCV 86.6 85.9  PLT 196 220    Basic Metabolic Panel:   Recent Labs Lab 2015-07-10 0307 2015-07-10 0801 05/10/16 0853  NA 136  --  138  K 3.5  --  3.8  CL 98*  --  99*  CO2 29  --  29  GLUCOSE 127*  --  108*  BUN 15  --  16  CREATININE 0.83  --  0.81  CALCIUM 8.0*  --  7.8*  MG  --  1.8  --   PHOS  --  2.8  --     Lipid Panel:     Component Value Date/Time   CHOL 159 04/10/2016 0637   TRIG 76 04/10/2016 0637   HDL 52 04/10/2016 0637   CHOLHDL 3.1 04/10/2016 0637   VLDL 15 04/10/2016 0637   LDLCALC 92 04/10/2016 0637   HgbA1c:  Lab Results  Component Value Date   HGBA1C 5.8 (H) 04/10/2016   Urine Drug Screen: No results found for: LABOPIA, COCAINSCRNUR, LABBENZ, AMPHETMU, THCU, LABBARB    IMAGING  Dg Chest 2 View 08/17/15 Shallow inspiration with bibasilar atelectatic changes.  Infiltrate is less likely.     Ct Head Wo Contrast 08/17/15 No acute intracranial hemorrhage.  Focal area of hypodensity extending from the lateral aspect of the left lateral ventricle inferiorly appears new since the prior CT and may represent a subacute or less likely an acute infarct.  Correlation with clinical exam recommended. MRI is recommended if there is clinical concern for acute infarct.  Moderate age-related atrophy and chronic microvascular ischemic changes.  No acute/ traumatic cervical spine pathology.     Ct Cervical Spine Wo Contrast 08/17/15 No acute/ traumatic cervical spine pathology.    [facility] 05/10/2016 1. Occlusion of the left M2 inferior division.  Note that the acute infarct on yesterday's MRI is predominantly in the lateral lenticulostriate territory however.  2. Motion degraded neck MRA with limited assessment of the large vessels in the upper chest and lower neck.  No evidence of flow limiting cervical carotid or vertebral artery stenosis within this limitation.      Mr Brain Wo Contrast 08/17/15 1. Acute infarct within the left subinsular white matter, left frontal operculum and left basal ganglia (predominantly the caudate head and putamen). No hemorrhage, midline shift  or mass effect.  2. Punctate focus of possible ischemia within the left cerebral peduncle.  3. Chronic microvascular disease.  4. Bilateral mastoid effusions.    Dg Chest Port 1 View 05/04/2016 Mild bibasilar atelectasis.     Dg Chest Port 1v Same Day 05/10/2016 Bronchitic changes with bibasilar atelectasis. Aortic atherosclerosis.     Dg Knee Complete 4 Views Right 05/05/2016 No acute fracture or dislocation.    Physical exam: Exam:                 CV: RRR, no MRG. No Carotid Bruits. No peripheral edema, warm, nontender Eyes: Conjunctivae clear without exudates or hemorrhage  Mental Status: Patient is difficult to arouse, opens  eyes minimally to sternal run, non verbal, not following commands or tracking. Cranial Nerves:    The pupils are equal, round, and reactive to light. Does not blink to threat. Conjugate gaze.  Right lower facial droop. +oculocephalic reflex, +corneals.  . Tone:    Decreased right arm and right leg    Motor:    Dense right hemiparesis, moves left side spontaneously     Sensation: Does not w/d to stim on the right     ASSESSMENT/PLAN Ms. Catherine ClossRegina Jirak is a 82 y.o. female with history of COPD, recent left middle cerebral artery stroke with left carotid stenting and embolectomy, presenting with a recent fall and aphasia.  She did not receive IV t-PA due to unknown time of onset.  Stroke:  Dominant infarct possibly embolic - source unknown  Resultant  Dense right hemiparesis  MRI - acute infarct within the left subinsular white matter, left frontal operculum and left basal ganglia  MRA - occlusion of the left M2 inferior division.   Carotid Doppler - MRA neck  2D Echo - TEE 04/13/2016 - EF 45 - 50%. No cardiac source of emboli identified.   Discussion with family pending discussion on palliative care, otherwise Loop recorder is recommended  LDL - 04/10/2016 - 92 - recheck  HgbA1c - 04/10/2016 - 5.8  Repeat CT head due to encephalopathy, discussed with Dr. Isidoro Donningai who is going to order. She is now in step down due to change in MS, possibly aspiration.  VTE prophylaxis Diet NPO time specified  No antithrombotic prior to admission, now on aspirin 300 mg suppository daily  Patient counseled to be compliant with her antithrombotic medications  Ongoing aggressive stroke risk factor management  Therapy recommendations: pending  Disposition:  Pending  Hypertension        Stable  Permissive hypertension (OK if < 220/120) but gradually normalize in 5-7 days  Long-term BP goal normotensive  Hyperlipidemia  Home meds:  Lipitor 40 mg daily not resumed in hospital  LDL 92, goal <  70  Resume Lipitor when taking POs  Continue statin at discharge  Recheck lipids in AM    Other Stroke Risk Factors  Advanced age  Obesity, Body mass index is 30.18 kg/m., recommend weight loss, diet and exercise as appropriate   Hx stroke/TIA  Other Active Problems  Pt was not on antiplatlete therapy PTA.  Mild leukocytosis 11.6 (afebrile)  Hospital day # 1   Personally examined patient and images, and have participated in and made any corrections needed to history, physical, neuro exam,assessment and plan as stated above.  I have personally obtained the history, evaluated lab date, reviewed imaging studies and agree with radiology interpretations.    Naomie DeanAntonia Ahern, MD Stroke Neurology    To contact Stroke Continuity provider, please  refer to [email or web address] After hours, contact General Neurology

## 2016-05-10 NOTE — Progress Notes (Signed)
Attempt report to 4N

## 2016-05-10 NOTE — Progress Notes (Signed)
RN unable to do neuro assessment; patient doesn't follow commands; disoriented x4. Will continue to monitor.

## 2016-05-11 DIAGNOSIS — R1310 Dysphagia, unspecified: Secondary | ICD-10-CM

## 2016-05-11 DIAGNOSIS — I10 Essential (primary) hypertension: Secondary | ICD-10-CM

## 2016-05-11 DIAGNOSIS — Z9189 Other specified personal risk factors, not elsewhere classified: Secondary | ICD-10-CM

## 2016-05-11 DIAGNOSIS — Z515 Encounter for palliative care: Secondary | ICD-10-CM

## 2016-05-11 DIAGNOSIS — I63419 Cerebral infarction due to embolism of unspecified middle cerebral artery: Secondary | ICD-10-CM

## 2016-05-11 DIAGNOSIS — I63132 Cerebral infarction due to embolism of left carotid artery: Secondary | ICD-10-CM

## 2016-05-11 DIAGNOSIS — Z7189 Other specified counseling: Secondary | ICD-10-CM

## 2016-05-11 LAB — LIPID PANEL
CHOLESTEROL: 147 mg/dL (ref 0–200)
HDL: 42 mg/dL (ref >40)
LDL Cholesterol: 86 mg/dL (ref 0–99)
TRIGLYCERIDES: 95 mg/dL (ref <150)
Total CHOL/HDL Ratio: 3.5 RATIO
VLDL: 19 mg/dL (ref 0–40)

## 2016-05-11 LAB — CBC
HCT: 35.8 % — ABNORMAL LOW (ref 36.0–46.0)
HEMOGLOBIN: 11.4 g/dL — AB (ref 12.0–15.0)
MCH: 27.4 pg (ref 26.0–34.0)
MCHC: 31.8 g/dL (ref 30.0–36.0)
MCV: 86.1 fL (ref 78.0–100.0)
PLATELETS: 218 10*3/uL (ref 150–400)
RBC: 4.16 MIL/uL (ref 3.87–5.11)
RDW: 15.9 % — ABNORMAL HIGH (ref 11.5–15.5)
WBC: 11.8 10*3/uL — ABNORMAL HIGH (ref 4.0–10.5)

## 2016-05-11 LAB — BASIC METABOLIC PANEL
Anion gap: 11 (ref 5–15)
BUN: 18 mg/dL (ref 6–20)
CHLORIDE: 99 mmol/L — AB (ref 101–111)
CO2: 29 mmol/L (ref 22–32)
Calcium: 7.5 mg/dL — ABNORMAL LOW (ref 8.9–10.3)
Creatinine, Ser: 0.85 mg/dL (ref 0.44–1.00)
Glucose, Bld: 109 mg/dL — ABNORMAL HIGH (ref 65–99)
POTASSIUM: 3.4 mmol/L — AB (ref 3.5–5.1)
SODIUM: 139 mmol/L (ref 135–145)

## 2016-05-11 MED ORDER — NYSTATIN 100000 UNIT/GM EX POWD
Freq: Three times a day (TID) | CUTANEOUS | Status: DC
  Administered 2016-05-11 – 2016-05-13 (×9): via TOPICAL
  Administered 2016-05-14: 1 g via TOPICAL
  Administered 2016-05-14 – 2016-05-15 (×3): via TOPICAL
  Administered 2016-05-15: 1 g via TOPICAL
  Administered 2016-05-15 – 2016-05-16 (×3): via TOPICAL
  Administered 2016-05-16: 1 g via TOPICAL
  Administered 2016-05-17 (×2): via TOPICAL
  Administered 2016-05-17: 1 g via TOPICAL
  Administered 2016-05-18 – 2016-05-21 (×12): via TOPICAL
  Filled 2016-05-11 (×2): qty 15

## 2016-05-11 MED ORDER — KCL IN DEXTROSE-NACL 20-5-0.45 MEQ/L-%-% IV SOLN
INTRAVENOUS | Status: DC
  Administered 2016-05-11 – 2016-05-15 (×5): via INTRAVENOUS
  Filled 2016-05-11 (×7): qty 1000

## 2016-05-11 NOTE — Consult Note (Signed)
Consultation Note Date: 05/11/2016   Patient Name: Catherine Day  DOB: [birth year]  MRN: [id number]  Age / Sex: 82 y.o., female  PCP: No primary care provider on file. Referring Physician: Mendel Corning, MD  Reason for Consultation: Establishing goals of care  HPI/Patient Profile: 82 y.o. female  with past medical history of COPD on home oxygen and recent stroke in November admitted on 04/18/2016 with fall and worsened right sided deficits. Recent hospitalization for stroke requiring tPA and complete revascularization of occluded left MCA, left ACA, and left ICA in interventional radiology. This hospitalization was complicated by ESBL positive UTI and aspiration pneumonia. She was discharged to a nursing facility. MRI from this admit showed infarcts in subcortical left hemisphere. MRA with occlusion of the left M2 division. Repeat CT showed evolving left MCA distribution subacute infarct. PT/OT evaluations pending. Patient failed speech swallow evaluation on 12/24. Palliative medicine consultation for goals of care.   Clinical Assessment and Goals of Care: Met with patient, son and his family (Wife and grandson) at bedside. Introduced palliative care. The patient is awake, alert, and tracking this afternoon. She is hard of hearing, but when spoke to loudly in right ear, she is able to nod yes/no and follow some commands. She attempted to say a few words during our conversation.   Son, Saralyn Pilar describes the last month since her stroke. She was visiting from [place] for thanksgiving when she had the stroke. He tells me her baseline after the stroke was right sided weakness. She was able to walk with assist and verbally communicate. She had just passed a speech swallow study the day she came in with the second stroke.   Discussed my concern with her inability to participate with PT and OT. (Per PT note, patient with lethargy  on 12/24). Also spoke of my concern with her failing the speech swallow evaluation and if unable to pass in the near future, she will require another source of nutrition. Discussed and educated on feeding tubes/artificial nutrition. I asked Saralyn Pilar if his mother had ever expressed her wishes on feeding tubes and artificial nutrition. She has never spoken of her wishes on this subject. Discussed the high risk for aspiration with peg tubes and this may extend her life but not give her a better quality of life.   Saralyn Pilar is hopeful that she will be able to work with speech therapy tomorrow and pass a swallow test. His sister will be traveling from [place] in the next day or two and he does not want to make decisions without her.     Answered questions. Patient and family need ongoing support and education.   SUMMARY OF RECOMMENDATIONS    At this point, family still hopeful for improvement. Not realistic that this may be her new baseline.   DNR/DNI  Patient and family require further discussions and education regarding GOC, artificial nutrition/hydration, and guarded prognosis.   PMT will f/u tomorrow, 12/26.   Code Status/Advance Care Planning:  DNR   Symptom Management:   Per  attending  Palliative Prophylaxis:   Aspiration, Delirium Protocol, Oral Care and Turn Reposition  Additional Recommendations (Limitations, Scope, Preferences):  Full Scope Treatment-except DNR/DNI  Psycho-social/Spiritual:   Desire for further Chaplaincy support:no  Additional Recommendations: Caregiving  Support/Resources  Prognosis:   Unable to determine  Discharge Planning: To Be Determined      Primary Diagnoses: Present on Admission: . (Resolved) CVA (cerebral vascular accident) ([place]) . Cerebrovascular accident (CVA) due to bilateral thrombosis of vertebral arteries ([place]) . Chronic obstructive pulmonary disease ([place]) . (Resolved) Diastolic dysfunction . Recent Embolic stroke ([place]) - B MCA  and L cerebellar . Hyperlipidemia . Hypertension . Hypokalemia . Chronic combined systolic and diastolic CHF, NYHA class 1 ([place])   I have reviewed the medical record, interviewed the patient and family, and examined the patient. The following aspects are pertinent.  Past Medical History:  Diagnosis Date  . COPD (chronic obstructive pulmonary disease) ([place])   . Stroke [facility])    Social History   Social History  . Marital status: Married    Spouse name: N/A  . Number of children: N/A  . Years of education: N/A   Social History Main Topics  . Smoking status: Never Smoker  . Smokeless tobacco: Never Used     Comment: quit 1970's  . Alcohol use No  . Drug use: No  . Sexual activity: No   Other Topics Concern  . None   Social History Narrative  . None   History reviewed. No pertinent family history. Scheduled Meds: .  stroke: mapping our early stages of recovery book   Does not apply Once  . aspirin  300 mg Rectal Daily   Or  . aspirin  325 mg Oral Daily  . Chlorhexidine Gluconate Cloth  6 each Topical Q0600  . enoxaparin (LOVENOX) injection  40 mg Subcutaneous Q24H  . fluticasone furoate-vilanterol  1 puff Inhalation Daily  . ipratropium-albuterol  3 mL Nebulization QID  . mupirocin ointment  1 application Nasal BID  . nystatin   Topical TID  . piperacillin-tazobactam (ZOSYN)  IV  3.375 g Intravenous Q8H   Continuous Infusions: . dextrose 5 % and 0.45 % NaCl with KCl 20 mEq/L 50 mL/hr at 05/11/16 1226   PRN Meds:.acetaminophen **OR** acetaminophen (TYLENOL) oral liquid 160 mg/5 mL **OR** acetaminophen, ipratropium-albuterol, senna-docusate Medications Prior to Admission:  Prior to Admission medications   Medication Sig Start Date End Date Taking? Authorizing Provider  atorvastatin (LIPITOR) 40 MG tablet Take 1 tablet (40 mg total) by mouth daily at 6 PM. 04/27/16  Yes Orson Eva, MD  Calcium Citrate-Vitamin D (CITRACAL + D PO) Take 1 tablet by mouth daily.   Yes  Historical Provider, MD  denosumab (PROLIA) 60 MG/ML SOLN injection Inject 60 mg into the skin every 6 (six) months. Administer in upper arm, thigh, or abdomen Last injection 03/24/16   Yes Historical Provider, MD  fluticasone furoate-vilanterol (BREO ELLIPTA) 100-25 MCG/INH AEPB Inhale 1 puff into the lungs daily.   Yes Historical Provider, MD  furosemide (LASIX) 40 MG tablet Take 1 tablet (40 mg total) by mouth daily. 04/28/16  Yes David Tat, MD  ipratropium-albuterol (DUONEB) 0.5-2.5 (3) MG/3ML SOLN Take 3 mLs by nebulization See admin instructions. Inhale 1 vial by nebulization every morning and may also use 2 more times during the day as needed for shortness of breath or wheezing   Yes Historical Provider, MD  levalbuterol (XOPENEX HFA) 45 MCG/ACT inhaler Inhale 1-2 puffs into the lungs every 4 (four) hours  as needed for wheezing or shortness of breath.   Yes Historical Provider, MD  levalbuterol Penne Lash) 0.63 MG/3ML nebulizer solution Take 0.63 mg by nebulization every 6 (six) hours as needed for wheezing or shortness of breath.   Yes Historical Provider, MD  levofloxacin (LEVAQUIN) 500 MG tablet Take 500 mg by mouth daily.   Yes Historical Provider, MD  Multiple Vitamins-Minerals (DECUBI-VITE PO) Take 1 capsule by mouth daily.   Yes Historical Provider, MD  OXYGEN Inhale 2 L into the lungs continuous.   Yes Historical Provider, MD  tiotropium (SPIRIVA) 18 MCG inhalation capsule Place 18 mcg into inhaler and inhale daily.   Yes Historical Provider, MD  valsartan (DIOVAN) 40 MG tablet Take 1 tablet (40 mg total) by mouth daily. 04/28/16  Yes Orson Eva, MD  vitamin C (ASCORBIC ACID) 500 MG tablet Take 500 mg by mouth 2 (two) times daily.   Yes Historical Provider, MD  zinc sulfate 220 (50 Zn) MG capsule Take 220 mg by mouth daily.   Yes Historical Provider, MD   No Known Allergies Review of Systems  Unable to perform ROS: Acuity of condition    Physical Exam  Constitutional: She appears  ill.  HENT:  Head: Normocephalic and atraumatic.  Cardiovascular: Regular rhythm and normal heart sounds.   Pulmonary/Chest: Effort normal. She has decreased breath sounds.  Abdominal: Soft. Bowel sounds are normal.  Neurological: She is alert.  ? Orientation. Patient attempting to speak with me. Very hard of hearing but able to nod yes and no to my questions and following commands  Skin: Skin is dry. There is pallor.  BLE cool  Psychiatric: She has a normal mood and affect. Her behavior is normal. She is noncommunicative.  Nursing note and vitals reviewed.  Vital Signs: BP (!) 132/57   Pulse 92   Temp 97.4 F (36.3 C) (Oral)   Resp (!) 27   Ht '5\' 2"'  (1.575 m)   Wt 74.8 kg (165 lb)   SpO2 95%   BMI 30.18 kg/m  Pain Assessment: PAINAD   Pain Score: 0-No pain  SpO2: SpO2: 95 % O2 Device:SpO2: 95 % O2 Flow Rate: .O2 Flow Rate (L/min): 2 L/min  IO: Intake/output summary:   Intake/Output Summary (Last 24 hours) at 05/11/16 1556 Last data filed at 05/11/16 1222  Gross per 24 hour  Intake              150 ml  Output                0 ml  Net              150 ml    LBM: Last BM Date:  (PTA) Baseline Weight: Weight: 73.9 kg (163 lb) Most recent weight: Weight: 74.8 kg (165 lb)     Palliative Assessment/Data: PPS 20%   Flowsheet Rows   Flowsheet Row Most Recent Value  Intake Tab  Referral Department  Hospitalist  Unit at Time of Referral  Intermediate Care Unit  Palliative Care Primary Diagnosis  Neurology  Palliative Care Type  New Palliative care  Reason for referral  Clarify Goals of Care  Date of Admission  05/08/2016  Date first seen by Palliative Care  05/11/16  Clinical Assessment  Palliative Performance Scale Score  20%  Psychosocial & Spiritual Assessment  Palliative Care Outcomes  Patient/Family meeting held?  Yes  Who was at the meeting?  patient, son Saralyn Pilar) and his wife and son  [place]  goals of care, Provided psychosocial  or spiritual support, Provided advance care planning      Time In: 1400 Time Out: 1515 Time Total: 23mn Greater than 50%  of this time was spent counseling and coordinating care related to the above assessment and plan.  Signed by:  MIhor Dow FNP-C Palliative Medicine Team  Phone: [phone]: [phone]  Please contact Palliative Medicine Team phone at [phone] questions and concerns.  For individual provider: See AShea Evans

## 2016-05-11 NOTE — Progress Notes (Signed)
STROKE TEAM PROGRESS NOTE   HISTORY OF PRESENT ILLNESS (per record) Catherine Day is a 82 y.o. female with a history of recent left carotid stenting and embolectomy following left MCA stroke. She did well following the stroke, and went to a nursing home without aphasia. Yesterday, she fell around 11:30 PM 05/08/16 ([facility]) and was noted to be aphasic. Last seen well was earlier in the evening, but no clear time was documented in the notes. An MRI obtained showed infarcts in subcortical left hemisphere. tPA was not given due to unclear time of onset.   SUBJECTIVE (INTERVAL HISTORY) Patient lying in bed. Decreased interaction. Dr. Pearlean BrownieSethi discussed at length with Dr. Isidoro Donningai.    OBJECTIVE Temp:  [97.8 F (36.6 C)-98.7 F (37.1 C)] 98 F (36.7 C) (12/25 0802) Pulse Rate:  [76-98] 80 (12/25 0802) Cardiac Rhythm: Normal sinus rhythm (12/25 0700) Resp:  [18-25] 19 (12/25 0802) BP: (94-143)/(48-108) 94/55 (12/25 0802) SpO2:  [92 %-100 %] 95 % (12/25 0802)  CBC:   Recent Labs Lab 04/18/2016 0307 05/10/16 0853 05/11/16 0202  WBC 11.8* 11.6* 11.8*  NEUTROABS 9.4*  --   --   HGB 11.7* 11.7* 11.4*  HCT 36.2 36.6 35.8*  MCV 86.6 85.9 86.1  PLT 196 220 218    Basic Metabolic Panel:   Recent Labs Lab 04/26/2016 0801 05/10/16 0853 05/11/16 0202  NA  --  138 139  K  --  3.8 3.4*  CL  --  99* 99*  CO2  --  29 29  GLUCOSE  --  108* 109*  BUN  --  16 18  CREATININE  --  0.81 0.85  CALCIUM  --  7.8* 7.5*  MG 1.8  --   --   PHOS 2.8  --   --     Lipid Panel:     Component Value Date/Time   CHOL 147 05/11/2016 0202   TRIG 95 05/11/2016 0202   HDL 42 05/11/2016 0202   CHOLHDL 3.5 05/11/2016 0202   VLDL 19 05/11/2016 0202   LDLCALC 86 05/11/2016 0202   HgbA1c:  Lab Results  Component Value Date   HGBA1C 5.8 (H) 04/10/2016   Urine Drug Screen: No results found for: LABOPIA, COCAINSCRNUR, LABBENZ, AMPHETMU, THCU, LABBARB    IMAGING  Ct Head Wo Contrast 05/10/2016 1. Evolving left  MCA distribution subacute infarct. 2. Chronic mastoid fluid bilaterally.  04/28/2016 No acute intracranial hemorrhage.  Focal area of hypodensity extending from the lateral aspect of the left lateral ventricle inferiorly appears new since the prior CT and may represent a subacute or less likely an acute infarct.  Correlation with clinical exam recommended. MRI is recommended if there is clinical concern for acute infarct.  Moderate age-related atrophy and chronic microvascular ischemic changes.  No acute/ traumatic cervical spine pathology.   Ct Cervical Spine Wo Contrast 05/10/2016 No acute/ traumatic cervical spine pathology.  Mri / Mra Head and Neck Wo Contrast 05/10/2016 1. Occlusion of the left M2 inferior division.  Note that the acute infarct on yesterday's MRI is predominantly in the lateral lenticulostriate territory however.  2. Motion degraded neck MRA with limited assessment of the large vessels in the upper chest and lower neck.  No evidence of flow limiting cervical carotid or vertebral artery stenosis within this limitation.   Mri Brain Wo Contrast 05/04/2016 1. Acute infarct within the left subinsular white matter, left frontal operculum and left basal ganglia (predominantly the caudate head and putamen). No hemorrhage, midline shift or mass  effect.  2. Punctate focus of possible ischemia within the left cerebral peduncle.  3. Chronic microvascular disease.  4. Bilateral mastoid effusions.    PHYSICAL EXAM frail elderly lady not in distress. CV: RRR, no MRG. No Carotid Bruits. No peripheral edema, warm, nontender Eyes: Conjunctivae clear without exudates or hemorrhage Mental Status: Patient is difficult to arouse, opens eyes minimally to sternal run, speech is rambling and difficult to understand, not following commands or tracking. Follows barely occasional midline commands. Cranial Nerves:    The pupils are equal, round, and reactive to light. Does not blink to  threat. Conjugate gaze.  Right lower facial droop. +oculocephalic reflex, +corneals.  Tone:    Decreased right arm and right leg Motor:    Dense right hemiparesis, moves left side spontaneously Sensation: Does not w/d to stim on the right      ASSESSMENT/PLAN [name] is a 82 y.o. female with history of COPD, recent left middle cerebral artery stroke with left carotid stenting and embolectomy in November 2017, presenting with a recent fall and aphasia.  She did not receive IV t-PA due to unknown time of onset.  Stroke:  Dominant L MCA infarct embolic - source unknown  Resultant  Dense right hemiparesis, dysphagia  MRI - acute infarct within the left subinsular white matter, left frontal operculum and left basal ganglia  MRA - occlusion of the left M2 inferior division.   Carotid Doppler - MRA neck  2D Echo - [facility] 04/13/2016 - EF 45 - 50%. No cardiac source of emboli identified.   LDL - 04/10/2016 - 86  HgbA1c - 04/10/2016 - 5.8  Repeat CT head evolving L MCA infarct. No new abnormality  VTE prophylaxis Diet NPO time specified  No antithrombotic prior to admission, now on aspirin 300 mg suppository daily  Patient counseled to be compliant with her antithrombotic medications  Discussion with family pending discussion on palliative care, otherwise Loop recorder is recommended  Therapy recommendations: pending   Disposition:  Anticipate return to SNF  Hypertension  Stable  BP goal normotensive  Hyperlipidemia  Home meds:  Lipitor 40 mg daily not resumed in hospital  LDL 86, goal < 70  Resume Lipitor when taking POs/tube placed  Continue statin at discharge  Other Stroke Risk Factors  Advanced age  Obesity, Body mass index is 30.18 kg/m., recommend weight loss, diet and exercise as appropriate   Hx stroke/TIAchronic combined systolid and diastolic CHF  Other Active Problems  Encephalopathy - does nowt appear to be associated with stroke. Will check  EEG - ordered pending   Mild leukocytosis 11.6 (afebrile)  COPD w/ hypoxic respiratory failure  Aspiration PNA  Hospital Day # 2  [facility] See Amion for Pager information 05/11/2016 11:38 AM  I have personally examined this patient, reviewed notes, independently viewed imaging studies, participated in medical decision making and plan of care.ROS completed by me personally and pertinent positives fully documented  I have made any additions or clarifications directly to the above note. Agree with note above. She has unfortunately presented with recurrent left brain infarct and in fact was admitted a few weeks ago for aspiration pneumonia as well. Her prognosis seems quite poor given recurrent strokes and general medical condition. Family not available at the bedside for discussion but agree with Dr. Isidoro Donning that consideration for palliative care is appropriate. Continue aspirin for stroke prevention Greater than 50% time during this 25 minute visit was spent on counseling and coordination  of care in about his stroke and ongoing care.   Delia HeadyPramod Paisli Silfies, MD Medical Director [facility] Pager: [phone] 05/11/2016 12:51 PM  To contact Stroke Continuity provider, please refer to [email or web address]. After hours, contact General Neurology

## 2016-05-11 NOTE — Progress Notes (Signed)
[facility]                                                                              Patient Demographics  Catherine ClossRegina Day, is a 82 y.o. female, DOB - 10/10/1927, BJY:[id number]  Admit date - 04/27/2016   Admitting Physician Haydee SalterPhillip M Hobbs, MD  Outpatient Primary MD for the patient is No primary care provider on file.  Outpatient specialists:   LOS - 2  days    Chief Complaint  Patient presents with  . Fall       Brief summary   Patient is a 82 year old female with recent protracted hospitalization for left-sided embolic stroke, COPD, O2 dependent, prior hospitalization course complicated by ESBL positive Escherichia coli UTI and aspiration pneumonia. She was discharged to skilled nursing facility.   Patient was sent to the ER after a fall. She was last seen normal earlier in the evening without specific time given. Per ER notes the patient fell about 11:30 PM and at that time it was discovered that the patient was not moving the right side as per previous baseline. MRI showed acute infarcts within the left subinsular white matter, left frontal operculum the left basal ganglia with an area of punctate possible ischemia in the left cerebral peduncle.   Assessment & Plan    Acute CVA in the setting of Recent Embolic stroke (HCC) - B MCA and L cerebellar/Cerebrovascular accident (CVA) due to bilateral thrombosis of vertebral arteries  -Patient recently admitted for acute stroke requiring tPA as well as complete revascularization of occluded left MCA, left ACA and left ICA terminus in interventional radiology. Baseline deficits included continued right lower extremity weakness with associated gait disturbance and mild to moderate right upper extremity weakness. -Patient now admitted with right-sided hemiparesis, flaccid with findings of new left side multiple area strokes on MRI, also has expressive aphasia - MRA of the brain showed occlusion of the left M2 division,  acute infarct on MRIs predominantly in the lateral lenticular striate territory.  -Continue full dose aspirin PR for CVA prophylaxis, failed swallow evaluation -Underwent TEE previous admission with no embolic source found; no indication to repeat echocardiogram this admission -Full stroke evaluation last admission so no indication to repeat carotid duplex, hemoglobin A1c or lipid panel this admission -Frequent neurological checks -PTOT evaluation pending but doubt patient will be able to participate in PT evaluations, very lethargic - Speech evaluation-> failed a swallow evaluation, NPO -Repeat CT head showed evolving left MCA distribution subacute infarct  Acute encephalopathy with hypoxic respiratory failure, aspiration pneumonia --Continue IV Zosyn, NPO  - will need goals of care for feeding, palliative medicine consult placed    Hypertension -Allow for permissive hypertension    Chronic combined systolic and diastolic CHF, NYHA class 1  -Euvolemic, hold diuretics -TEE previous admission revealed EF of 45-50% with grade 1 diastolic dysfunction -Discharge documentation states previous dry weight 170 pounds with discharge weight 163 pounds    Chronic obstructive pulmonary disease with chronic respiratory failure -Reported is O2 dependent on chronic 2 L oxygen - Placed on DuoNeb's    Hyperlipidemia -Holding Lipitor while NPO    Dysphagia  Failed swallow evaluation   Code Status: dnr  DVT Prophylaxis:  Lovenox  Family Communication: No family member at the bedside   Disposition Plan:  stepdown  Time Spent in minutes   25 minutes  Procedures:  MRI brain MRA brain   Consultants:   Neurology  Antimicrobials:   IV Zosyn 12/24   Medications  Scheduled Meds: .  stroke: mapping our early stages of recovery book   Does not apply Once  . aspirin  300 mg Rectal Daily   Or  . aspirin  325 mg Oral Daily  . Chlorhexidine Gluconate Cloth  6 each Topical Q0600  .  enoxaparin (LOVENOX) injection  40 mg Subcutaneous Q24H  . fluticasone furoate-vilanterol  1 puff Inhalation Daily  . ipratropium-albuterol  3 mL Nebulization QID  . mupirocin ointment  1 application Nasal BID  . nystatin   Topical TID  . piperacillin-tazobactam (ZOSYN)  IV  3.375 g Intravenous Q8H   Continuous Infusions: . dextrose 5 % and 0.45 % NaCl with KCl 20 mEq/L     PRN Meds:.acetaminophen **OR** acetaminophen (TYLENOL) oral liquid 160 mg/5 mL **OR** acetaminophen, ipratropium-albuterol, senna-docusate   Antibiotics   Anti-infectives    Start     Dose/Rate Route Frequency Ordered Stop   05/10/16 0900  piperacillin-tazobactam (ZOSYN) IVPB 3.375 g     3.375 g 12.5 mL/hr over 240 Minutes Intravenous Every 8 hours 05/10/16 0859          Subjective:   Frayda Egley was seen and examined today. Still lethargic, unable to obtain review of systems from the patient.    Objective:   Vitals:   05/11/16 0200 05/11/16 0407 05/11/16 0753 05/11/16 0802  BP: 133/64 (!) 143/68  (!) 94/55  Pulse: 76 77  80  Resp: (!) 23 (!) 24  19  Temp:  98.5 F (36.9 C)  98 F (36.7 C)  TempSrc:  Axillary  Oral  SpO2: 96% 95% 96% 95%  Weight:      Height:        Intake/Output Summary (Last 24 hours) at 05/11/16 1126 Last data filed at 05/10/16 2250  Gross per 24 hour  Intake              150 ml  Output                0 ml  Net              150 ml     Wt Readings from Last 3 Encounters:  04/24/2016 74.8 kg (165 lb)  04/27/16 74.1 kg (163 lb 5.8 oz)  04/14/16 91.2 kg (201 lb)     Exam  General: lethargic, Difficult to arouse, opens eyes on loud voice   HEENT:    Neck: Supple, no JVD  Cardiovascular: S1 S2 clear, RRR  Respiratory: Diffuse rhonchi bilaterally   Gastrointestinal: Soft, nontender, nondistended, + bowel sounds  Ext: no cyanosis clubbing or edema  Neuro: not cooperative, does not follow commands   Skin: No rashes  Psych: does not follow commands   Data  Reviewed:  I have personally reviewed following labs and imaging studies  Micro Results Recent Results (from the past 240 hour(s))  MRSA PCR Screening     Status: Abnormal   Collection Time: 05/10/16  3:51 PM  Result Value Ref Range Status   MRSA by PCR POSITIVE (A) NEGATIVE Final    Comment:        The GeneXpert MRSA Assay (FDA approved  for NASAL specimens only), is one component of a comprehensive MRSA colonization surveillance program. It is not intended to diagnose MRSA infection nor to guide or monitor treatment for MRSA infections. RESULT CALLED TO, READ BACK BY AND VERIFIED WITH: Fayrene Fearing RN AT 8119 05/10/16 BY [facility]     Radiology Reports Dg Chest 2 View  Result Date: 05/10/2016 CLINICAL DATA:  82 year old female with fall.  History of COPD. EXAM: CHEST  2 VIEW COMPARISON:  Chest radiograph dated 06/25/2015 FINDINGS: There is shallow inspiration. Bibasilar streaky densities most compatible with atelectatic changes/ scarring. Infiltrate is less likely. No pleural effusion or pneumothorax. Stable cardiac silhouette. The aorta is tortuous. There is osteopenia with degenerative changes of the spine. No acute osseous pathology. IMPRESSION: Shallow inspiration with bibasilar atelectatic changes. Infiltrate is less likely. Electronically Signed   By: Elgie Collard M.D.   On: 04/29/2016 03:05   Ct Head Wo Contrast  Result Date: 05/10/2016 CLINICAL DATA:  Acute encephalopathy. Recent left MCA distribution infarct. EXAM: CT HEAD WITHOUT CONTRAST TECHNIQUE: Contiguous axial images were obtained from the base of the skull through the vertex without intravenous contrast. COMPARISON:  MRI of 05/07/2016.  CT 05/10/2016. FINDINGS: Brain: Expected cerebral volume loss for age. Moderate low density in the periventricular white matter likely related to small vessel disease. Evolution of left MCA distribution infarct, slightly more well-defined today. Examples in the lateral left basal  ganglia, subinsular region, and extending into the periventricular white matter of the left frontal lobe. No significant mass effect or midline shift. No complicating hemorrhage, hydrocephalus, intra-axial, or extra-axial fluid collection. Vascular: Intracranial carotid atherosclerosis. Skull: Normal Sinuses/Orbits: Normal orbits and globes. Minimal ethmoid air cell mucosal thickening. Chronic bilateral mastoid effusions are unchanged. Other: None IMPRESSION: 1. Evolving left MCA distribution subacute infarct. 2. Chronic mastoid fluid bilaterally. Electronically Signed   By: Jeronimo Greaves M.D.   On: 05/10/2016 17:56   Ct Head Wo Contrast  Result Date: 05/12/2016 CLINICAL DATA:  82 year old female with fall and laceration to back of the head. EXAM: CT HEAD WITHOUT CONTRAST CT CERVICAL SPINE WITHOUT CONTRAST TECHNIQUE: Multidetector CT imaging of the head and cervical spine was performed following the standard protocol without intravenous contrast. Multiplanar CT image reconstructions of the cervical spine were also generated. COMPARISON:  Brain MRI dated 04/26/2016 and head CT dated 04/09/2016 FINDINGS: CT HEAD FINDINGS Brain: There is moderate age-related atrophy and chronic microvascular ischemic changes. Focal area of hypodensity in the left corona radiata extending from the left lateral ventricle inferiorly (series 201 images 16-20) appears new since the prior CT and may represent stop a subacute and less likely an acute infarct. Associated mild edema. Correlation with clinical exam recommended. MRI is recommended if there is clinical concern for an acute infarct. There is no acute intracranial hemorrhage. No mass effect or midline shift noted. Vascular: No hyperdense vessel or unexpected calcification. Skull: Normal. Negative for fracture or focal lesion. Sinuses/Orbits: There is partial opacification of multiple ethmoid air cells. No air-fluid levels. There is opacification of the mastoid air cells  bilaterally. Other: Small right forehead scalp contusion. CT CERVICAL SPINE FINDINGS Alignment: Normal. Skull base and vertebrae: No acute fracture. No primary bone lesion or focal pathologic process. Soft tissues and spinal canal: No prevertebral fluid or swelling. No visible canal hematoma. Disc levels: Multilevel degenerative changes with mild facet hypertrophy. Upper chest: Negative. Other: None IMPRESSION: No acute intracranial hemorrhage. Focal area of hypodensity extending from the lateral aspect of the left lateral ventricle inferiorly appears  new since the prior CT and may represent a subacute or less likely an acute infarct. Correlation with clinical exam recommended. MRI is recommended if there is clinical concern for acute infarct. Moderate age-related atrophy and chronic microvascular ischemic changes. No acute/ traumatic cervical spine pathology. Electronically Signed   By: Elgie Collard M.D.   On: 2016-05-11 03:38   Ct Cervical Spine Wo Contrast  Result Date: 11-May-2016 CLINICAL DATA:  82 year old female with fall and laceration to back of the head. EXAM: CT HEAD WITHOUT CONTRAST CT CERVICAL SPINE WITHOUT CONTRAST TECHNIQUE: Multidetector CT imaging of the head and cervical spine was performed following the standard protocol without intravenous contrast. Multiplanar CT image reconstructions of the cervical spine were also generated. COMPARISON:  Brain MRI dated 04/26/2016 and head CT dated 04/09/2016 FINDINGS: CT HEAD FINDINGS Brain: There is moderate age-related atrophy and chronic microvascular ischemic changes. Focal area of hypodensity in the left corona radiata extending from the left lateral ventricle inferiorly (series 201 images 16-20) appears new since the prior CT and may represent stop a subacute and less likely an acute infarct. Associated mild edema. Correlation with clinical exam recommended. MRI is recommended if there is clinical concern for an acute infarct. There is no acute  intracranial hemorrhage. No mass effect or midline shift noted. Vascular: No hyperdense vessel or unexpected calcification. Skull: Normal. Negative for fracture or focal lesion. Sinuses/Orbits: There is partial opacification of multiple ethmoid air cells. No air-fluid levels. There is opacification of the mastoid air cells bilaterally. Other: Small right forehead scalp contusion. CT CERVICAL SPINE FINDINGS Alignment: Normal. Skull base and vertebrae: No acute fracture. No primary bone lesion or focal pathologic process. Soft tissues and spinal canal: No prevertebral fluid or swelling. No visible canal hematoma. Disc levels: Multilevel degenerative changes with mild facet hypertrophy. Upper chest: Negative. Other: None IMPRESSION: No acute intracranial hemorrhage. Focal area of hypodensity extending from the lateral aspect of the left lateral ventricle inferiorly appears new since the prior CT and may represent a subacute or less likely an acute infarct. Correlation with clinical exam recommended. MRI is recommended if there is clinical concern for acute infarct. Moderate age-related atrophy and chronic microvascular ischemic changes. No acute/ traumatic cervical spine pathology. Electronically Signed   By: Elgie Collard M.D.   On: May 11, 2016 03:38   Mr Maxine Glenn Head Wo Contrast  Result Date: 05/10/2016 CLINICAL DATA:  Acute left MCA infarct. EXAM: MRA NECK WITHOUT AND WITH CONTRAST MRA HEAD WITHOUT CONTRAST TECHNIQUE: Multiplanar and multiecho pulse sequences of the neck were obtained without and with intravenous contrast. Angiographic images of the neck were obtained using MRA technique without and with intravenous contast.; Angiographic images of the Circle of Willis were obtained using MRA technique without intravenous contrast. CONTRAST:  14mL MULTIHANCE GADOBENATE DIMEGLUMINE 529 MG/ML IV SOLN COMPARISON:  None. FINDINGS: MRA NECK FINDINGS The study is moderately motion degraded mainly in the lower neck/  upper chest. Presumed standard 3 vessel aortic arch branching, though the origin of the left vertebral artery is not established on this study due to artifact. The brachiocephalic and subclavian arteries are grossly patent. However, motion artifact completely obscures the region of the distal brachiocephalic artery and proximal right common carotid artery. Artifact through this level also obscures a portion of the mid left common carotid artery as well as both proximal V1 segments. The portions of the common carotid arteries which are well-visualized are widely patent. The internal carotid arteries are patent bilaterally without evidence of significant stenosis. The  distal right cervical ICA is tortuous. The vertebral arteries are patent and codominant. There is no evidence of flow limiting stenosis in the V2 or V3 segments. There are areas of mild-to-moderate narrowing versus artifact in the V3 segments. The V1 segments are not well evaluated. MRA HEAD FINDINGS The study is mildly motion degraded. The visualized distal vertebral arteries are patent to the basilar and codominant. Right PICA origin is patent. Left PICA is not identified, nor are AICAs. Basilar artery is widely patent. SCA origins are patent. There is a fetal type origin of the left PCA with absent left P1. The PCAs are patent without evidence of significant proximal stenosis. Internal carotid arteries are patent from skullbase to carotid termini without evidence of significant stenosis. ACAs and right MCA are patent without evidence of major branch occlusion or flow limiting proximal stenosis. The left M1 segment is patent with at most mild narrowing distally. There is occlusion of the left M2 inferior division at its origin. There is mild reconstitution of flow and some more distal branches. Flow is present in the left M2 superior division, though it has an attenuated appearance. No intracranial aneurysm is identified. IMPRESSION: 1. Occlusion of  the left M2 inferior division. Note that the acute infarct on yesterday's MRI is predominantly in the lateral lenticulostriate territory however. 2. Motion degraded neck MRA with limited assessment of the large vessels in the upper chest and lower neck. No evidence of flow limiting cervical carotid or vertebral artery stenosis within this limitation. Electronically Signed   By: Sebastian Ache M.D.   On: 05/10/2016 08:08   Mr Maxine Glenn Neck W Wo Contrast  Result Date: 05/10/2016 CLINICAL DATA:  Acute left MCA infarct. EXAM: MRA NECK WITHOUT AND WITH CONTRAST MRA HEAD WITHOUT CONTRAST TECHNIQUE: Multiplanar and multiecho pulse sequences of the neck were obtained without and with intravenous contrast. Angiographic images of the neck were obtained using MRA technique without and with intravenous contast.; Angiographic images of the Circle of Willis were obtained using MRA technique without intravenous contrast. CONTRAST:  14mL MULTIHANCE GADOBENATE DIMEGLUMINE 529 MG/ML IV SOLN COMPARISON:  None. FINDINGS: MRA NECK FINDINGS The study is moderately motion degraded mainly in the lower neck/ upper chest. Presumed standard 3 vessel aortic arch branching, though the origin of the left vertebral artery is not established on this study due to artifact. The brachiocephalic and subclavian arteries are grossly patent. However, motion artifact completely obscures the region of the distal brachiocephalic artery and proximal right common carotid artery. Artifact through this level also obscures a portion of the mid left common carotid artery as well as both proximal V1 segments. The portions of the common carotid arteries which are well-visualized are widely patent. The internal carotid arteries are patent bilaterally without evidence of significant stenosis. The distal right cervical ICA is tortuous. The vertebral arteries are patent and codominant. There is no evidence of flow limiting stenosis in the V2 or V3 segments. There are  areas of mild-to-moderate narrowing versus artifact in the V3 segments. The V1 segments are not well evaluated. MRA HEAD FINDINGS The study is mildly motion degraded. The visualized distal vertebral arteries are patent to the basilar and codominant. Right PICA origin is patent. Left PICA is not identified, nor are AICAs. Basilar artery is widely patent. SCA origins are patent. There is a fetal type origin of the left PCA with absent left P1. The PCAs are patent without evidence of significant proximal stenosis. Internal carotid arteries are patent from skullbase to carotid  termini without evidence of significant stenosis. ACAs and right MCA are patent without evidence of major branch occlusion or flow limiting proximal stenosis. The left M1 segment is patent with at most mild narrowing distally. There is occlusion of the left M2 inferior division at its origin. There is mild reconstitution of flow and some more distal branches. Flow is present in the left M2 superior division, though it has an attenuated appearance. No intracranial aneurysm is identified. IMPRESSION: 1. Occlusion of the left M2 inferior division. Note that the acute infarct on yesterday's MRI is predominantly in the lateral lenticulostriate territory however. 2. Motion degraded neck MRA with limited assessment of the large vessels in the upper chest and lower neck. No evidence of flow limiting cervical carotid or vertebral artery stenosis within this limitation. Electronically Signed   By: Sebastian Ache M.D.   On: 05/10/2016 08:08   Mr Brain Wo Contrast  Result Date: 05-17-2016 CLINICAL DATA:  Fall with right-sided weakness. EXAM: MRI HEAD WITHOUT CONTRAST TECHNIQUE: Multiplanar, multiecho pulse sequences of the brain and surrounding structures were obtained without intravenous contrast. COMPARISON:  Head CT May 17, 2016 Brain MR 04/26/2016 FINDINGS: Brain: There are areas of diffusion restriction within the left caudate head, extending into the  putamen; the left frontal operculum; and of the subinsular white matter. The abnormality is in close proximity to the posterior limb of the internal capsule. There is also a possible focus of diffusion restriction within the left cerebral peduncle. No acute hemorrhage. There is beginning confluent hyperintense T2-weighted signal within the periventricular white matter, most often seen in the setting of chronic microvascular ischemia. No mass lesion or midline shift. No hydrocephalus or extra-axial fluid collection. The midline structures are normal. No age advanced or lobar predominant atrophy. Vascular: Major intracranial arterial and venous sinus flow voids are preserved. No evidence of chronic microhemorrhage or amyloid angiopathy. Skull and upper cervical spine: The visualized skull base, calvarium, upper cervical spine and extracranial soft tissues are normal. Sinuses/Orbits: Bilateral mastoid effusions. Paranasal sinuses are clear. Normal orbits. IMPRESSION: 1. Acute infarct within the left subinsular white matter, left frontal operculum and left basal ganglia (predominantly the caudate head and putamen). No hemorrhage, midline shift or mass effect. 2. Punctate focus of possible ischemia within the left cerebral peduncle. 3. Chronic microvascular disease. 4. Bilateral mastoid effusions. Electronically Signed   By: Deatra Robinson M.D.   On: May 17, 2016 05:39   Mr Brain Wo Contrast  Result Date: 04/26/2016 CLINICAL DATA:  Worsening hearing loss over the past 4-5 days. Stroke last month. EXAM: MRI HEAD WITHOUT CONTRAST TECHNIQUE: Multiplanar, multiecho pulse sequences of the brain and surrounding structures were obtained without intravenous contrast. COMPARISON:  04/10/2016 FINDINGS: Brain: Small volume blood products are again seen in the mesial left temporal lobe extending to the region of inferior basal ganglia/ genu of the left internal capsule. Minimal residual diffusion signal abnormality is present in  these locations related to the blood products. Abnormal trace diffusion signal elsewhere in the left greater than right cerebral hemispheres and left cerebellum on the prior study has resolved. There is minimal T2/FLAIR hyperintensity in the mesial left temporal lobe/ basal ganglia region and left temporal occipital region corresponding to the previously demonstrated infarcts. There is no evidence of acute infarct, new intracranial hemorrhage, mass, midline shift, or extra-axial fluid collection. Bilateral periventricular white matter T2 hyperintensities are unchanged and nonspecific but compatible with mild chronic small vessel ischemic disease. There is moderate cerebral atrophy. Vascular: Major intracranial vascular flow voids are  preserved. Skull and upper cervical spine: Unremarkable bone marrow signal. Sinuses/Orbits: Unremarkable orbits. Minimal posterior right ethmoid air cell mucosal thickening. Large bilateral mastoid effusions, increased from prior. Fluid is now present in the middle ear cavities bilaterally. Other: None. IMPRESSION: 1. Residual small volume blood products in the mesial left temporal lobe and left internal capsule/inferior basal ganglia region associated with subacute infarcts demonstrated on the prior MRI. 2. Resolved diffusion abnormality associated with the small infarcts elsewhere. 3. No acute infarct or new intracranial abnormality. 4. Large bilateral mastoid and middle ear effusions. Electronically Signed   By: Sebastian Ache M.D.   On: 04/26/2016 19:03   Dg Chest Port 1 View  Result Date: 2016/05/15 CLINICAL DATA:  Shortness of Breath following recent fall EXAM: PORTABLE CHEST 1 VIEW COMPARISON:  05/15/2016 FINDINGS: Cardiac shadow is stable. Bibasilar atelectatic changes are noted slightly more prominent than that seen on the prior exam. No pneumothorax or sizable effusion is seen. No bony abnormality is noted. IMPRESSION: Mild bibasilar atelectasis. Electronically Signed   By:  Alcide Clever M.D.   On: May 15, 2016 21:56   Dg Chest Port 1 View  Result Date: 04/23/2016 CLINICAL DATA:  Aspiration pneumonia EXAM: PORTABLE CHEST 1 VIEW COMPARISON:  04/23/2016 at 12:20 FINDINGS: Mild streaky opacity in the right days. Left lung is clear. Pulmonary vasculature is normal. No effusions. Hilar, mediastinal and cardiac contours are unremarkable and unchanged. IMPRESSION: Streaky right base opacity. No focal confluent consolidation. No significant change in the interim. Electronically Signed   By: Ellery Plunk M.D.   On: 04/23/2016 23:41   Dg Chest Port 1 View  Result Date: 04/23/2016 CLINICAL DATA:  COPD, aspiration pneumonia. EXAM: PORTABLE CHEST 1 VIEW COMPARISON:  Portable chest x-ray of Sep 20, 2015 FINDINGS: The lungs are mildly hyperinflated. The interstitial markings are coarse. Increased density at the right lung base is consistent with the clinically suspected aspiration pneumonia. The heart and pulmonary vascularity are normal. There is calcification in the wall of the tortuous thoracic aorta. There is no pleural effusion. The bony thorax exhibits no acute abnormality. IMPRESSION: Right basilar pneumonia consistent with aspiration. COPD. Thoracic aortic atherosclerosis. When the patient can tolerate the procedure, a PA and lateral chest x-ray would be useful. Electronically Signed   By: David  [place] M.D.   On: 04/23/2016 12:34   Dg Chest Port 1 View  Result Date: 04/21/2016 CLINICAL DATA:  Initial evaluation for acute respiratory distress, wheezing. History of COPD. EXAM: PORTABLE CHEST 1 VIEW COMPARISON:  Prior radiograph from 04/21/2016. FINDINGS: Cardiac and mediastinal silhouettes are stable in size and contour, and remain within normal limits. Atherosclerotic disease noted within the aortic arch. Lungs are hypoinflated. Changes related COPD noted. Increased right basilar opacity favored to reflect atelectasis/bronchovascular crowding, although recurrent infiltrate not  excluded. Left basilar atelectasis noted. Small left pleural effusion. No pulmonary edema or pneumothorax. No acute osseous abnormality. IMPRESSION: 1. Shallow lung inflation with increased right basilar opacity, favored to reflect atelectasis/ bronchovascular crowding, although recurrent infiltrate could be considered in the correct clinical setting. 2. Small left pleural effusion with associated atelectasis. 3. COPD. Electronically Signed   By: Rise Mu M.D.   On: 04/21/2016 20:38   Dg Chest Port 1 View  Result Date: 04/21/2016 CLINICAL DATA:  Follow-up wheezing, COPD. EXAM: PORTABLE CHEST 1 VIEW COMPARISON:  04/20/2016 FINDINGS: There is hyperinflation of the lungs compatible with COPD. Heart and mediastinal contours are within normal limits. No focal opacities or effusions. No acute bony abnormality. IMPRESSION: COPD.  No active disease. Electronically Signed   By: Charlett Nose M.D.   On: 04/21/2016 08:27   Dg Chest Port 1 View  Result Date: 04/20/2016 CLINICAL DATA:  Pneumonia. EXAM: PORTABLE CHEST 1 VIEW COMPARISON:  04/16/2016. FINDINGS: Mediastinum and hilar structures are normal. Heart size stable. Low lung volumes with basilar atelectasis. Slight improvement of right base infiltrate. A component of right base bronchiectasis cannot be excluded. Tiny left pleural effusion. No pneumothorax. IMPRESSION: 1. Interim partial clearing of right base infiltrate. A component of bronchiectasis in the right base cannot be excluded. 2. Low lung volumes.  Small left pleural effusion noted. Electronically Signed   By: Maisie Fus  Register   On: 04/20/2016 07:20   Dg Chest Port 1 View  Result Date: 04/16/2016 CLINICAL DATA:  Acute onset of shortness of breath. Initial encounter. EXAM: PORTABLE CHEST 1 VIEW COMPARISON:  Chest radiograph performed 04/14/2016 FINDINGS: Persistent right basilar airspace opacity remains concerning for pneumonia. No definite pleural effusion or pneumothorax is seen. Mild  vascular congestion is noted. The cardiomediastinal silhouette is mildly enlarged. No acute osseous abnormalities are identified. IMPRESSION: 1. Persistent right basilar airspace opacity remains concerning for pneumonia. 2. Mild vascular congestion and mild cardiomegaly. Electronically Signed   By: Roanna Raider M.D.   On: 04/16/2016 06:26   Dg Chest Port 1 View  Result Date: 04/14/2016 CLINICAL DATA:  Acute onset of shortness of breath and cough. Initial encounter. EXAM: PORTABLE CHEST 1 VIEW COMPARISON:  Chest radiograph performed 04/11/2016 FINDINGS: Right basilar airspace opacity raises concern for pneumonia. No pleural effusion or pneumothorax is seen. The cardiomediastinal silhouette is borderline normal in size. No acute osseous abnormalities are identified. IMPRESSION: Right basilar pneumonia noted. Electronically Signed   By: Roanna Raider M.D.   On: 04/14/2016 21:50   Dg Chest Port 1v Same Day  Result Date: 05/10/2016 CLINICAL DATA:  Dyspnea, history COPD, stroke EXAM: PORTABLE CHEST 1 VIEW COMPARISON:  Portable exam 0932 hours compared to 04/23/2016 FINDINGS: Upper normal heart size. Rotated to the RIGHT. Mediastinal contours and pulmonary vascularity normal. Atherosclerotic calcification aorta. Bronchitic changes with bibasilar atelectasis. Remaining lungs clear. No pleural effusion or pneumothorax. IMPRESSION: Bronchitic changes with bibasilar atelectasis. Aortic atherosclerosis. Electronically Signed   By: Ulyses Southward M.D.   On: 05/10/2016 09:53   Dg Knee Complete 4 Views Right  Result Date: 04/30/2016 CLINICAL DATA:  82 year old female with fall and right knee swelling. EXAM: RIGHT KNEE - COMPLETE 4+ VIEW COMPARISON:  None. FINDINGS: No acute fracture or dislocation. The bones are osteopenic. There is meniscal chondrocalcinosis with mild osteoarthritic changes of the knees. No joint effusion. The soft tissues appear unremarkable. IMPRESSION: No acute fracture or dislocation.  Electronically Signed   By: Elgie Collard M.D.   On: 05/08/2016 03:04   Dg Swallowing Func-speech Pathology  Result Date: 04/17/2016 Objective Swallowing Evaluation: Type of Study: MBS-Modified Barium Swallow Study Patient Details Name: Sholanda Croson MRN: [id number] Date of Birth: 08-24-1927 Today's Date: 04/17/2016 Time: SLP Start Time (ACUTE ONLY): 1045-SLP Stop Time (ACUTE ONLY): 1115 SLP Time Calculation (min) (ACUTE ONLY): 30 min Past Medical History: Past Medical History: Diagnosis Date . COPD (chronic obstructive pulmonary disease) (HCC)  Past Surgical History: Past Surgical History: Procedure Laterality Date . IR GENERIC HISTORICAL  04/09/2016  IR ANGIO INTRA EXTRACRAN SEL COM CAROTID INNOMINATE UNI R MOD SED 04/09/2016 Julieanne Cotton, MD [facility] . IR GENERIC HISTORICAL  04/09/2016  IR PERCUTANEOUS ART THROMBECTOMY/INFUSION INTRACRANIAL INC DIAG ANGIO 04/09/2016 Julieanne Cotton, MD [facility] .  PELVIC FRACTURE SURGERY   . RADIOLOGY WITH ANESTHESIA N/A 04/09/2016  Procedure: RADIOLOGY WITH ANESTHESIA;  Surgeon: Julieanne Cotton, MD;  Location: [facility] OR;  Service: Radiology;  Laterality: N/A; . TEE WITHOUT CARDIOVERSION N/A 04/13/2016  Procedure: TRANSESOPHAGEAL ECHOCARDIOGRAM (TEE);  Surgeon: Thurmon Fair, MD;  Location: [facility] ENDOSCOPY;  Service: Cardiovascular;  Laterality: N/A; HPI: 82 y.o. right handed female with h/o COPD. Presented 04/10/2016 with sudden onset of right-sided weakness and inability to speak. 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 Pt discharged to CIR 11/28, however, later that night she became dyspneic and diaphoretic. She was given albuterol nebs with some improvement. CXR with RLL infiltrate. Subjective: pt awake, pleasant Assessment / Plan / Recommendation CHL IP CLINICAL IMPRESSIONS 04/17/2016 Therapy Diagnosis Mild oral phase  dysphagia;Mild pharyngeal phase dysphagia Clinical Impression Pt's swallow function demonstrates mild improvements since [facility] 11/26.  She presents with piecemeal oral bolusing; persisting delay in initiation (valleculae and pyriforms) -however, this is not necessarily considered disordered in the elderly population.  There may have been questionable, high penetration of liquids, but it was difficult to discern.  No aspiration. Pyriform sinuses appear to be asymmetric and there is mild residue remaining post-swallow. Recommend advancing diet to dysphagia 3, nectar-thick liquids for now.  Will follow for functional toleration/safety.   Impact on safety and function Mild aspiration risk   CHL IP TREATMENT RECOMMENDATION 04/17/2016 Treatment Recommendations Therapy as outlined in treatment plan below   Prognosis 04/17/2016 Prognosis for Safe Diet Advancement Good Barriers to Reach Goals -- Barriers/Prognosis Comment -- CHL IP DIET RECOMMENDATION 04/17/2016 SLP Diet Recommendations Dysphagia 3 (Mech soft) solids;Nectar thick liquid Liquid Administration via Cup Medication Administration Whole meds with puree Compensations Small sips/bites;Slow rate Postural Changes --   CHL IP OTHER RECOMMENDATIONS 04/17/2016 Recommended Consults -- Oral Care Recommendations Oral care BID Other Recommendations --   CHL IP FOLLOW UP RECOMMENDATIONS 04/17/2016 Follow up Recommendations Inpatient Rehab   CHL IP FREQUENCY AND DURATION 04/17/2016 Speech Therapy Frequency (ACUTE ONLY) min 2x/week Treatment Duration 2 weeks      CHL IP ORAL PHASE 04/17/2016 Oral Phase Impaired Oral - Pudding Teaspoon -- Oral - Pudding Cup -- Oral - Honey Teaspoon -- Oral - Honey Cup -- Oral - Nectar Teaspoon -- Oral - Nectar Cup -- Oral - Nectar Straw -- Oral - Thin Teaspoon -- Oral - Thin Cup -- Oral - Thin Straw -- Oral - Puree Piecemeal swallowing Oral - Mech Soft Piecemeal swallowing Oral - Regular -- Oral - Multi-Consistency -- Oral - Pill -- Oral Phase - Comment  --  CHL IP PHARYNGEAL PHASE 04/17/2016 Pharyngeal Phase Impaired Pharyngeal- Pudding Teaspoon -- Pharyngeal -- Pharyngeal- Pudding Cup -- Pharyngeal -- Pharyngeal- Honey Teaspoon -- Pharyngeal -- Pharyngeal- Honey Cup NT Pharyngeal -- Pharyngeal- Nectar Teaspoon -- Pharyngeal -- Pharyngeal- Nectar Cup Pharyngeal residue - pyriform;Delayed swallow initiation-pyriform sinuses Pharyngeal Material enters airway, remains ABOVE vocal cords then ejected out Pharyngeal- Nectar Straw -- Pharyngeal -- Pharyngeal- Thin Teaspoon -- Pharyngeal -- Pharyngeal- Thin Cup Delayed swallow initiation-pyriform sinuses;Pharyngeal residue - pyriform Pharyngeal Material enters airway, remains ABOVE vocal cords then ejected out Pharyngeal- Thin Straw -- Pharyngeal -- Pharyngeal- Puree Delayed swallow initiation-vallecula;Pharyngeal residue - pyriform Pharyngeal -- Pharyngeal- Mechanical Soft Delayed swallow initiation-vallecula;Pharyngeal residue - pyriform Pharyngeal -- Pharyngeal- Regular -- Pharyngeal -- Pharyngeal- Multi-consistency -- Pharyngeal -- Pharyngeal- Pill -- Pharyngeal -- Pharyngeal Comment --  CHL IP  CERVICAL ESOPHAGEAL PHASE 04/12/2016 Cervical Esophageal Phase WFL Pudding Teaspoon -- Pudding Cup -- Honey Teaspoon -- Honey Cup -- Nectar Teaspoon -- Nectar Cup -- Nectar Straw -- Thin Teaspoon -- Thin Cup -- Thin Straw -- Puree -- Mechanical Soft -- Regular -- Multi-consistency -- Pill -- Cervical Esophageal Comment -- No flowsheet data found. [name], Amanda Laurice 04/17/2016, 11:50 AM              Dg Swallowing Func-speech Pathology  Result Date: 04/12/2016 Objective Swallowing Evaluation: Type of Study: MBS-Modified Barium Swallow Study Patient Details Name: Catherine ClossRegina Dillavou MRN: [id number] Date of Birth: 02/07/1928 Today's Date: 04/12/2016 Time: SLP Start Time (ACUTE ONLY): 1116-SLP Stop Time (ACUTE ONLY): 1131 SLP Time Calculation (min) (ACUTE ONLY): 15 min Past Medical History: Past Medical History: Diagnosis Date . COPD  (chronic obstructive pulmonary disease) (HCC)  Past Surgical History: Past Surgical History: Procedure Laterality Date . PELVIC FRACTURE SURGERY   . RADIOLOGY WITH ANESTHESIA N/A 04/09/2016  Procedure: RADIOLOGY WITH ANESTHESIA;  Surgeon: Julieanne CottonSanjeev Deveshwar, MD;  Location: [facility] OR;  Service: Radiology;  Laterality: N/A; HPI: Ms [name] is an 82 yo female with h/o COPD admitted with mental status change, right sided weakness, hemianopsia, left gaze preference,  Pt required intubation and was extubated yesterday.  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  Swallow and speech eval ordered Subjective: pt awake, pleasant Assessment / Plan / Recommendation CHL IP CLINICAL IMPRESSIONS 04/12/2016 Therapy Diagnosis Moderate pharyngeal phase dysphagia Clinical Impression Pt has a moderate pharyngeal dysphagia due in large part to delayed swallow initiation and decreased sensation of airway penetration. Liquids spill to the pyriform sinuses, resulting in deep, silent penetration of thin and nectar thick liquids. Although this does not occur with every bolus, it is silent and unable to be completely cleared with cued coughing, making her at a higher risk for an aspiration-related infection. Her strength is adequate with no significant residuals remaining post-swallow. Recommend Dys 3 diet and honey thick liquids. SLP to f/u for tolerance and readiness to advance. Impact on safety and function Moderate aspiration risk   CHL IP TREATMENT RECOMMENDATION 04/12/2016 Treatment Recommendations Therapy as outlined in treatment plan below   Prognosis 04/12/2016 Prognosis for Safe Diet Advancement Good Barriers to Reach Goals -- Barriers/Prognosis Comment -- CHL IP DIET RECOMMENDATION 04/12/2016 SLP Diet Recommendations Dysphagia 3 (Mech soft) solids;Honey thick liquids Liquid Administration via Cup;No straw Medication Administration Whole meds with  puree Compensations Slow rate;Small sips/bites Postural Changes Remain semi-upright after after feeds/meals (Comment);Seated upright at 90 degrees   CHL IP OTHER RECOMMENDATIONS 04/12/2016 Recommended Consults -- Oral Care Recommendations Oral care BID Other Recommendations Order thickener from pharmacy;Prohibited food (jello, ice cream, thin soups);Remove water pitcher   CHL IP FOLLOW UP RECOMMENDATIONS 04/12/2016 Follow up Recommendations Inpatient Rehab   CHL IP FREQUENCY AND DURATION 04/12/2016 Speech Therapy Frequency (ACUTE ONLY) min 2x/week Treatment Duration 2 weeks      CHL IP ORAL PHASE 04/12/2016 Oral Phase WFL Oral - Pudding Teaspoon -- Oral - Pudding Cup -- Oral - Honey Teaspoon -- Oral - Honey Cup -- Oral - Nectar Teaspoon -- Oral - Nectar Cup -- Oral - Nectar Straw -- Oral - Thin Teaspoon -- Oral - Thin Cup -- Oral - Thin Straw -- Oral - Puree -- Oral - Mech Soft -- Oral - Regular -- Oral - Multi-Consistency -- Oral - Pill -- Oral Phase - Comment --  CHL IP PHARYNGEAL  PHASE 04/12/2016 Pharyngeal Phase Impaired Pharyngeal- Pudding Teaspoon -- Pharyngeal -- Pharyngeal- Pudding Cup -- Pharyngeal -- Pharyngeal- Honey Teaspoon -- Pharyngeal -- Pharyngeal- Honey Cup Delayed swallow initiation-pyriform sinuses Pharyngeal -- Pharyngeal- Nectar Teaspoon -- Pharyngeal -- Pharyngeal- Nectar Cup Delayed swallow initiation-pyriform sinuses;Penetration/Aspiration before swallow Pharyngeal Material enters airway, remains ABOVE vocal cords and not ejected out Pharyngeal- Nectar Straw -- Pharyngeal -- Pharyngeal- Thin Teaspoon -- Pharyngeal -- Pharyngeal- Thin Cup Delayed swallow initiation-pyriform sinuses;Penetration/Aspiration before swallow Pharyngeal Material enters airway, CONTACTS cords and not ejected out Pharyngeal- Thin Straw -- Pharyngeal -- Pharyngeal- Puree Delayed swallow initiation-vallecula Pharyngeal -- Pharyngeal- Mechanical Soft Delayed swallow initiation-vallecula Pharyngeal -- Pharyngeal- Regular  -- Pharyngeal -- Pharyngeal- Multi-consistency -- Pharyngeal -- Pharyngeal- Pill -- Pharyngeal -- Pharyngeal Comment --  CHL IP CERVICAL ESOPHAGEAL PHASE 04/12/2016 Cervical Esophageal Phase WFL Pudding Teaspoon -- Pudding Cup -- Honey Teaspoon -- Honey Cup -- Nectar Teaspoon -- Nectar Cup -- Nectar Straw -- Thin Teaspoon -- Thin Cup -- Thin Straw -- Puree -- Mechanical Soft -- Regular -- Multi-consistency -- Pill -- Cervical Esophageal Comment -- No flowsheet data found. [name] 04/12/2016, 1:05 PM  [name], M.A. CCC-SLP [phone]              Lab Data:  CBC:  Recent Labs Lab 04/19/2016 0307 05/10/16 0853 05/11/16 0202  WBC 11.8* 11.6* 11.8*  NEUTROABS 9.4*  --   --   HGB 11.7* 11.7* 11.4*  HCT 36.2 36.6 35.8*  MCV 86.6 85.9 86.1  PLT 196 220 218   Basic Metabolic Panel:  Recent Labs Lab 04/25/2016 0307 04/25/2016 0801 05/10/16 0853 05/11/16 0202  NA 136  --  138 139  K 3.5  --  3.8 3.4*  CL 98*  --  99* 99*  CO2 29  --  29 29  GLUCOSE 127*  --  108* 109*  BUN 15  --  16 18  CREATININE 0.83  --  0.81 0.85  CALCIUM 8.0*  --  7.8* 7.5*  MG  --  1.8  --   --   PHOS  --  2.8  --   --    GFR: Estimated Creatinine Clearance: 45 mL/min (by C-G formula based on SCr of 0.85 mg/dL). Liver Function Tests:  Recent Labs Lab 05/13/2016 0307  AST 26  ALT 24  ALKPHOS 72  BILITOT 1.1  PROT 6.9  ALBUMIN 3.0*   No results for input(s): LIPASE, AMYLASE in the last 168 hours.  Recent Labs Lab 05/10/16 0853  AMMONIA 15   Coagulation Profile:  Recent Labs Lab 04/25/2016 0801  INR 1.10   Cardiac Enzymes:  Recent Labs Lab 05/08/2016 0307  TROPONINI <0.03   BNP (last 3 results) No results for input(s): PROBNP in the last 8760 hours. HbA1C: No results for input(s): HGBA1C in the last 72 hours. CBG:  Recent Labs Lab 05/10/16 1722  GLUCAP 83   Lipid Profile:  Recent Labs  05/11/16 0202  CHOL 147  HDL 42  LDLCALC 86  TRIG 95  CHOLHDL 3.5    Thyroid Function Tests: No results for input(s): TSH, T4TOTAL, FREET4, T3FREE, THYROIDAB in the last 72 hours. Anemia Panel: No results for input(s): VITAMINB12, FOLATE, FERRITIN, TIBC, IRON, RETICCTPCT in the last 72 hours. Urine analysis:    Component Value Date/Time   COLORURINE YELLOW 05/11/2016 0419   APPEARANCEUR HAZY (A) 05/01/2016 0419   LABSPEC 1.020 05/06/2016 0419   PHURINE 5.0 05/15/2016 0419   GLUCOSEU NEGATIVE 04/19/2016 0419   HGBUR  NEGATIVE 2016-06-07 0419   BILIRUBINUR NEGATIVE 2016/06/07 0419   KETONESUR NEGATIVE 2016/06/07 0419   PROTEINUR NEGATIVE 06/07/16 0419   NITRITE NEGATIVE 2016/06/07 0419   LEUKOCYTESUR NEGATIVE 2016-06-07 0419     RAI,RIPUDEEP M.D. [facility] 05/11/2016, 11:26 AM  Pager: [phone] Between 7am to 7pm - call Pager - [phone]  After 7pm go to [email or web address] - password [email or web address]  Call night coverage person covering after 7pm

## 2016-05-11 NOTE — Plan of Care (Signed)
Problem: Education: Goal: Knowledge of disease or condition will improve Outcome: Not Progressing Pt is lethargic and confused, unable to participate in stroke education at this time, no family at bedside.

## 2016-05-12 ENCOUNTER — Inpatient Hospital Stay (HOSPITAL_COMMUNITY): Payer: Medicare Other

## 2016-05-12 DIAGNOSIS — I63013 Cerebral infarction due to thrombosis of bilateral vertebral arteries: Principal | ICD-10-CM

## 2016-05-12 DIAGNOSIS — Z9189 Other specified personal risk factors, not elsewhere classified: Secondary | ICD-10-CM

## 2016-05-12 LAB — BASIC METABOLIC PANEL
Anion gap: 8 (ref 5–15)
BUN: 14 mg/dL (ref 6–20)
CHLORIDE: 103 mmol/L (ref 101–111)
CO2: 28 mmol/L (ref 22–32)
CREATININE: 0.75 mg/dL (ref 0.44–1.00)
Calcium: 7.4 mg/dL — ABNORMAL LOW (ref 8.9–10.3)
GFR calc Af Amer: >60 mL/min (ref >60)
Glucose, Bld: 129 mg/dL — ABNORMAL HIGH (ref 65–99)
POTASSIUM: 3.3 mmol/L — AB (ref 3.5–5.1)
SODIUM: 139 mmol/L (ref 135–145)

## 2016-05-12 LAB — CBC
HCT: 36.5 % (ref 36.0–46.0)
HEMOGLOBIN: 11.7 g/dL — AB (ref 12.0–15.0)
MCH: 27.7 pg (ref 26.0–34.0)
MCHC: 32.1 g/dL (ref 30.0–36.0)
MCV: 86.3 fL (ref 78.0–100.0)
PLATELETS: 215 10*3/uL (ref 150–400)
RBC: 4.23 MIL/uL (ref 3.87–5.11)
RDW: 15.9 % — ABNORMAL HIGH (ref 11.5–15.5)
WBC: 12.9 10*3/uL — AB (ref 4.0–10.5)

## 2016-05-12 NOTE — Progress Notes (Signed)
EEG completed, results pending. 

## 2016-05-12 NOTE — Progress Notes (Signed)
Holding EEG at this time per RN. Still waiting for family decision on palliative care. Nurse will follow up with Medical team to see if EEG is still warranted. RN stated will follow up with EEG dept when she knows more.

## 2016-05-12 NOTE — Procedures (Signed)
ELECTROENCEPHALOGRAM REPORT  Date of Study: 05/12/2016  Patient's Name: Catherine ClossRegina Day MRN: [id number] Date of Birth: 1927/06/27  Referring Provider: Annie MainSharon Biby, NP  Clinical History: This is an 82 year old woman with recent stroke, with aphasia  Medications: acetaminophen (TYLENOL) tablet 650 mg  aspirin tablet 325 mg  fluticasone furoate-vilanterol (BREO ELLIPTA) 100-25 MCG/INH 1 puff  ipratropium-albuterol (DUONEB) 0.5-2.5 (3) MG/3ML nebulizer solution 3 mL  piperacillin-tazobactam (ZOSYN) IVPB 3.375 g   Technical Summary: A multichannel digital EEG recording measured by the international 10-20 system with electrodes applied with paste and impedances below 5000 ohms performed as portable with EKG monitoring in an unresponsive patient.  Hyperventilation and photic stimulation were not performed.  The digital EEG was referentially recorded, reformatted, and digitally filtered in a variety of bipolar and referential montages for optimal display.   Description: The patient is unresponsive and lethargic during the recording.  During maximal wakefulness, there is an asymmetric, medium voltage 7 Hz posterior dominant rhythm better formed over the right occipital region. This is admixed with a moderate amount of diffuse 4-5 Hz theta and 2-3 Hz delta slowing of the background, with additional focal delta slowing and loss of faster frequencies over the left hemisphere. During drowsiness and sleep, there is an increase in theta and delta slowing of the background with poorly formed sleep spindles seen. Hyperventilation and photic stimulation were not performed.  There were no epileptiform discharges or electrographic seizures seen.    EKG lead was unremarkable.  Impression: This EEG is abnormal due to the presence of moderate diffuse slowing of the background with additional focal slowing over the left hemisphere  Clinical Correlation of the above findings indicates bilatera cerebral dysfunction  that is non-specific in etiology and can be seen with hypoxic/ischemic injury, toxic/metabolic encephalopathies, neurodegenerative disorders, or medication effect. Additional focal slowing over the left hemisphere indicates focal cerebral dysfunction in this region suggestive of underlying structural or physiologic abnormality.  The absence of epileptiform discharges does not rule out a clinical diagnosis of epilepsy.  Clinical correlation is advised.   Patrcia DollyKaren Lakitha Gordy, M.D.

## 2016-05-12 NOTE — Progress Notes (Signed)
PT Cancellation Note  Patient Details Name: Catherine ClossRegina Day MRN: [id number] DOB: 08/15/1927   Cancelled Treatment:    Reason Eval/Treat Not Completed: Patient at procedure or test/unavailable. Patient having EEG at this time, will follow   Fabio AsaDevon J Keenan Trefry 05/12/2016, 2:53 PM Charlotte Crumbevon Markcus Lazenby, PT DPT  [phone]

## 2016-05-12 NOTE — Progress Notes (Signed)
[facility]                                                                              Patient Demographics  Catherine Day, is a 82 y.o. female, DOB - [birth year], XBJ:[id number]  Admit date - 04/30/2016   Admitting Physician Haydee Salter, MD  Outpatient Primary MD for the patient is No primary care provider on file.  Outpatient specialists:   LOS - 3  days    Chief Complaint  Patient presents with  . Fall       Brief summary   Patient is a 82 year old female with recent protracted hospitalization for left-sided embolic stroke, COPD, O2 dependent, prior hospitalization course complicated by ESBL positive Escherichia coli UTI and aspiration pneumonia. She was discharged to skilled nursing facility.   Patient was sent to the ER after a fall. She was last seen normal earlier in the evening without specific time given. Per ER notes the patient fell about 11:30 PM and at that time it was discovered that the patient was not moving the right side as per previous baseline. MRI showed acute infarcts within the left subinsular white matter, left frontal operculum the left basal ganglia with an area of punctate possible ischemia in the left cerebral peduncle.   Assessment & Plan    Acute CVA in the setting of Recent Embolic stroke (HCC) - B MCA and L cerebellar/Cerebrovascular accident (CVA) due to bilateral thrombosis of vertebral arteries  -Patient recently admitted for acute stroke requiring tPA as well as complete revascularization of occluded left MCA, left ACA and left ICA terminus in interventional radiology. Baseline deficits included continued right lower extremity weakness with associated gait disturbance and mild to moderate right upper extremity weakness. -Patient now admitted with right-sided hemiparesis, flaccid with findings of new left side multiple area strokes on MRI, also has expressive aphasia - MRA of the brain showed occlusion of the left M2 division,  acute infarct on MRIs predominantly in the lateral lenticular striate territory.  -Continue full dose aspirin PR for CVA prophylaxis, failed swallow evaluation -Underwent TEE previous admission with no embolic source found; no indication to repeat echocardiogram this admission -Full stroke evaluation last admission so no indication to repeat carotid duplex, hemoglobin A1c or lipid panel this admission -Frequent neurological checks -PTOT evaluation pending but doubt patient will be able to participate in PT evaluations, very lethargic - Speech evaluation-> failed swallow evaluation, NPO -Repeat CT head showed evolving left MCA distribution subacute infarct - Overall poor prognosis, clearly aspirating with the diffuse rhonchorous lungs, has lost IV access, needs goals of care, family arriving today. Discussed with palliative care, GOC meeting today with family.   Acute encephalopathy with hypoxic respiratory failure, aspiration pneumonia, dysphagia, aspirating --Continue IV Zosyn, NPO  - will need goals of care for feeding, palliative medicine consult placed, GOC meeting today    Hypertension -Allow for permissive hypertension    Chronic combined systolic and diastolic CHF, NYHA class 1  -Euvolemic, hold diuretics -TEE previous admission revealed EF of 45-50% with grade 1 diastolic dysfunction -Discharge documentation states previous dry weight 170 pounds with discharge weight 163 pounds  Chronic obstructive pulmonary disease with chronic respiratory failure -Reported is O2 dependent on chronic 2 L oxygen - Placed on DuoNeb's    Hyperlipidemia -Holding Lipitor while NPO    Dysphagia Failed swallow evaluation   Code Status: dnr  DVT Prophylaxis:  Lovenox  Family Communication: No family member at the bedside, [place] today   Disposition Plan:  stepdown  Time Spent in minutes   25 minutes  Procedures:  MRI brain MRA brain   Consultants:     Neurology  Antimicrobials:   IV Zosyn 12/24   Medications  Scheduled Meds: .  stroke: mapping our early stages of recovery book   Does not apply Once  . aspirin  300 mg Rectal Daily   Or  . aspirin  325 mg Oral Daily  . Chlorhexidine Gluconate Cloth  6 each Topical Q0600  . enoxaparin (LOVENOX) injection  40 mg Subcutaneous Q24H  . fluticasone furoate-vilanterol  1 puff Inhalation Daily  . ipratropium-albuterol  3 mL Nebulization QID  . mupirocin ointment  1 application Nasal BID  . nystatin   Topical TID  . piperacillin-tazobactam (ZOSYN)  IV  3.375 g Intravenous Q8H   Continuous Infusions: . dextrose 5 % and 0.45 % NaCl with KCl 20 mEq/L 50 mL/hr at 05/11/16 1226   PRN Meds:.acetaminophen **OR** acetaminophen (TYLENOL) oral liquid 160 mg/5 mL **OR** acetaminophen, ipratropium-albuterol, senna-docusate   Antibiotics   Anti-infectives    Start     Dose/Rate Route Frequency Ordered Stop   05/10/16 0900  piperacillin-tazobactam (ZOSYN) IVPB 3.375 g     3.375 g 12.5 mL/hr over 240 Minutes Intravenous Every 8 hours 05/10/16 0859          Subjective:   Hughie ClossRegina Brunton was seen and examined today. Alert and awake today, confused, gurgling sounds throughout the lungs, unable to obtain review of systems from the patient.    Objective:   Vitals:   05/11/16 2029 05/12/16 0000 05/12/16 0351 05/12/16 0809  BP:  (!) 157/68 (!) 149/80 (!) 141/65  Pulse:  86 84 84  Resp:  (!) 32 (!) 32 (!) 30  Temp:  98.3 F (36.8 C)  97.5 F (36.4 C)  TempSrc:  Oral  Oral  SpO2: 98% 91% 93% 92%  Weight:      Height:        Intake/Output Summary (Last 24 hours) at 05/12/16 1122 Last data filed at 05/11/16 1700  Gross per 24 hour  Intake                0 ml  Output                0 ml  Net                0 ml     Wt Readings from Last 3 Encounters:  05/15/2016 74.8 kg (165 lb)  04/27/16 74.1 kg (163 lb 5.8 oz)  04/14/16 91.2 kg (201 lb)     Exam  General: Alert and awake, very  confused   HEENT:    Neck: Supple, no JVD  Cardiovascular: S1 S2 clear, RRR  Respiratory: Diffuse rhonchi bilaterally , gurgling sounds   Gastrointestinal: Soft, nontender, nondistended, + bowel sounds  Ext: no cyanosis clubbing or edema  Neuro: not cooperative,  follows some simple commands   Skin: No rashes  Psych: follows some simple commands   Data Reviewed:  I have personally reviewed following labs and imaging studies  Micro Results  Recent Results (from the past 240 hour(s))  MRSA PCR Screening     Status: Abnormal   Collection Time: 05/10/16  3:51 PM  Result Value Ref Range Status   MRSA by PCR POSITIVE (A) NEGATIVE Final    Comment:        The GeneXpert MRSA Assay (FDA approved for NASAL specimens only), is one component of a comprehensive MRSA colonization surveillance program. It is not intended to diagnose MRSA infection nor to guide or monitor treatment for MRSA infections. RESULT CALLED TO, READ BACK BY AND VERIFIED WITH: Fayrene Fearing RN AT 4098 05/10/16 BY [facility]     Radiology Reports Dg Chest 2 View  Result Date: 05-12-16 CLINICAL DATA:  82 year old female with fall.  History of COPD. EXAM: CHEST  2 VIEW COMPARISON:  Chest radiograph dated 06/25/2015 FINDINGS: There is shallow inspiration. Bibasilar streaky densities most compatible with atelectatic changes/ scarring. Infiltrate is less likely. No pleural effusion or pneumothorax. Stable cardiac silhouette. The aorta is tortuous. There is osteopenia with degenerative changes of the spine. No acute osseous pathology. IMPRESSION: Shallow inspiration with bibasilar atelectatic changes. Infiltrate is less likely. Electronically Signed   By: Elgie Collard M.D.   On: May 12, 2016 03:05   Ct Head Wo Contrast  Result Date: 05/10/2016 CLINICAL DATA:  Acute encephalopathy. Recent left MCA distribution infarct. EXAM: CT HEAD WITHOUT CONTRAST TECHNIQUE: Contiguous axial images were obtained from the base  of the skull through the vertex without intravenous contrast. COMPARISON:  MRI of 2016-05-12.  CT May 12, 2016. FINDINGS: Brain: Expected cerebral volume loss for age. Moderate low density in the periventricular white matter likely related to small vessel disease. Evolution of left MCA distribution infarct, slightly more well-defined today. Examples in the lateral left basal ganglia, subinsular region, and extending into the periventricular white matter of the left frontal lobe. No significant mass effect or midline shift. No complicating hemorrhage, hydrocephalus, intra-axial, or extra-axial fluid collection. Vascular: Intracranial carotid atherosclerosis. Skull: Normal Sinuses/Orbits: Normal orbits and globes. Minimal ethmoid air cell mucosal thickening. Chronic bilateral mastoid effusions are unchanged. Other: None IMPRESSION: 1. Evolving left MCA distribution subacute infarct. 2. Chronic mastoid fluid bilaterally. Electronically Signed   By: Jeronimo Greaves M.D.   On: 05/10/2016 17:56   Ct Head Wo Contrast  Result Date: 2016-05-12 CLINICAL DATA:  82 year old female with fall and laceration to back of the head. EXAM: CT HEAD WITHOUT CONTRAST CT CERVICAL SPINE WITHOUT CONTRAST TECHNIQUE: Multidetector CT imaging of the head and cervical spine was performed following the standard protocol without intravenous contrast. Multiplanar CT image reconstructions of the cervical spine were also generated. COMPARISON:  Brain MRI dated 04/26/2016 and head CT dated 04/09/2016 FINDINGS: CT HEAD FINDINGS Brain: There is moderate age-related atrophy and chronic microvascular ischemic changes. Focal area of hypodensity in the left corona radiata extending from the left lateral ventricle inferiorly (series 201 images 16-20) appears new since the prior CT and may represent stop a subacute and less likely an acute infarct. Associated mild edema. Correlation with clinical exam recommended. MRI is recommended if there is clinical  concern for an acute infarct. There is no acute intracranial hemorrhage. No mass effect or midline shift noted. Vascular: No hyperdense vessel or unexpected calcification. Skull: Normal. Negative for fracture or focal lesion. Sinuses/Orbits: There is partial opacification of multiple ethmoid air cells. No air-fluid levels. There is opacification of the mastoid air cells bilaterally. Other: Small right forehead scalp contusion. CT CERVICAL SPINE FINDINGS Alignment: Normal. Skull base and vertebrae: No acute fracture.  No primary bone lesion or focal pathologic process. Soft tissues and spinal canal: No prevertebral fluid or swelling. No visible canal hematoma. Disc levels: Multilevel degenerative changes with mild facet hypertrophy. Upper chest: Negative. Other: None IMPRESSION: No acute intracranial hemorrhage. Focal area of hypodensity extending from the lateral aspect of the left lateral ventricle inferiorly appears new since the prior CT and may represent a subacute or less likely an acute infarct. Correlation with clinical exam recommended. MRI is recommended if there is clinical concern for acute infarct. Moderate age-related atrophy and chronic microvascular ischemic changes. No acute/ traumatic cervical spine pathology. Electronically Signed   By: Elgie Collard M.D.   On: 04/30/2016 03:38   Ct Cervical Spine Wo Contrast  Result Date: 05/06/2016 CLINICAL DATA:  82 year old female with fall and laceration to back of the head. EXAM: CT HEAD WITHOUT CONTRAST CT CERVICAL SPINE WITHOUT CONTRAST TECHNIQUE: Multidetector CT imaging of the head and cervical spine was performed following the standard protocol without intravenous contrast. Multiplanar CT image reconstructions of the cervical spine were also generated. COMPARISON:  Brain MRI dated 04/26/2016 and head CT dated 04/09/2016 FINDINGS: CT HEAD FINDINGS Brain: There is moderate age-related atrophy and chronic microvascular ischemic changes. Focal area of  hypodensity in the left corona radiata extending from the left lateral ventricle inferiorly (series 201 images 16-20) appears new since the prior CT and may represent stop a subacute and less likely an acute infarct. Associated mild edema. Correlation with clinical exam recommended. MRI is recommended if there is clinical concern for an acute infarct. There is no acute intracranial hemorrhage. No mass effect or midline shift noted. Vascular: No hyperdense vessel or unexpected calcification. Skull: Normal. Negative for fracture or focal lesion. Sinuses/Orbits: There is partial opacification of multiple ethmoid air cells. No air-fluid levels. There is opacification of the mastoid air cells bilaterally. Other: Small right forehead scalp contusion. CT CERVICAL SPINE FINDINGS Alignment: Normal. Skull base and vertebrae: No acute fracture. No primary bone lesion or focal pathologic process. Soft tissues and spinal canal: No prevertebral fluid or swelling. No visible canal hematoma. Disc levels: Multilevel degenerative changes with mild facet hypertrophy. Upper chest: Negative. Other: None IMPRESSION: No acute intracranial hemorrhage. Focal area of hypodensity extending from the lateral aspect of the left lateral ventricle inferiorly appears new since the prior CT and may represent a subacute or less likely an acute infarct. Correlation with clinical exam recommended. MRI is recommended if there is clinical concern for acute infarct. Moderate age-related atrophy and chronic microvascular ischemic changes. No acute/ traumatic cervical spine pathology. Electronically Signed   By: Elgie Collard M.D.   On: 05/15/2016 03:38   Mr Maxine Glenn Head Wo Contrast  Result Date: 05/10/2016 CLINICAL DATA:  Acute left MCA infarct. EXAM: MRA NECK WITHOUT AND WITH CONTRAST MRA HEAD WITHOUT CONTRAST TECHNIQUE: Multiplanar and multiecho pulse sequences of the neck were obtained without and with intravenous contrast. Angiographic images of  the neck were obtained using MRA technique without and with intravenous contast.; Angiographic images of the Circle of Willis were obtained using MRA technique without intravenous contrast. CONTRAST:  14mL MULTIHANCE GADOBENATE DIMEGLUMINE 529 MG/ML IV SOLN COMPARISON:  None. FINDINGS: MRA NECK FINDINGS The study is moderately motion degraded mainly in the lower neck/ upper chest. Presumed standard 3 vessel aortic arch branching, though the origin of the left vertebral artery is not established on this study due to artifact. The brachiocephalic and subclavian arteries are grossly patent. However, motion artifact completely obscures the region of the  distal brachiocephalic artery and proximal right common carotid artery. Artifact through this level also obscures a portion of the mid left common carotid artery as well as both proximal V1 segments. The portions of the common carotid arteries which are well-visualized are widely patent. The internal carotid arteries are patent bilaterally without evidence of significant stenosis. The distal right cervical ICA is tortuous. The vertebral arteries are patent and codominant. There is no evidence of flow limiting stenosis in the V2 or V3 segments. There are areas of mild-to-moderate narrowing versus artifact in the V3 segments. The V1 segments are not well evaluated. MRA HEAD FINDINGS The study is mildly motion degraded. The visualized distal vertebral arteries are patent to the basilar and codominant. Right PICA origin is patent. Left PICA is not identified, nor are AICAs. Basilar artery is widely patent. SCA origins are patent. There is a fetal type origin of the left PCA with absent left P1. The PCAs are patent without evidence of significant proximal stenosis. Internal carotid arteries are patent from skullbase to carotid termini without evidence of significant stenosis. ACAs and right MCA are patent without evidence of major branch occlusion or flow limiting proximal  stenosis. The left M1 segment is patent with at most mild narrowing distally. There is occlusion of the left M2 inferior division at its origin. There is mild reconstitution of flow and some more distal branches. Flow is present in the left M2 superior division, though it has an attenuated appearance. No intracranial aneurysm is identified. IMPRESSION: 1. Occlusion of the left M2 inferior division. Note that the acute infarct on yesterday's MRI is predominantly in the lateral lenticulostriate territory however. 2. Motion degraded neck MRA with limited assessment of the large vessels in the upper chest and lower neck. No evidence of flow limiting cervical carotid or vertebral artery stenosis within this limitation. Electronically Signed   By: Sebastian AcheAllen  Grady M.D.   On: 05/10/2016 08:08   Mr Maxine GlennMra Neck W Wo Contrast  Result Date: 05/10/2016 CLINICAL DATA:  Acute left MCA infarct. EXAM: MRA NECK WITHOUT AND WITH CONTRAST MRA HEAD WITHOUT CONTRAST TECHNIQUE: Multiplanar and multiecho pulse sequences of the neck were obtained without and with intravenous contrast. Angiographic images of the neck were obtained using MRA technique without and with intravenous contast.; Angiographic images of the Circle of Willis were obtained using MRA technique without intravenous contrast. CONTRAST:  14mL MULTIHANCE GADOBENATE DIMEGLUMINE 529 MG/ML IV SOLN COMPARISON:  None. FINDINGS: MRA NECK FINDINGS The study is moderately motion degraded mainly in the lower neck/ upper chest. Presumed standard 3 vessel aortic arch branching, though the origin of the left vertebral artery is not established on this study due to artifact. The brachiocephalic and subclavian arteries are grossly patent. However, motion artifact completely obscures the region of the distal brachiocephalic artery and proximal right common carotid artery. Artifact through this level also obscures a portion of the mid left common carotid artery as well as both proximal V1  segments. The portions of the common carotid arteries which are well-visualized are widely patent. The internal carotid arteries are patent bilaterally without evidence of significant stenosis. The distal right cervical ICA is tortuous. The vertebral arteries are patent and codominant. There is no evidence of flow limiting stenosis in the V2 or V3 segments. There are areas of mild-to-moderate narrowing versus artifact in the V3 segments. The V1 segments are not well evaluated. MRA HEAD FINDINGS The study is mildly motion degraded. The visualized distal vertebral arteries are patent to the basilar  and codominant. Right PICA origin is patent. Left PICA is not identified, nor are AICAs. Basilar artery is widely patent. SCA origins are patent. There is a fetal type origin of the left PCA with absent left P1. The PCAs are patent without evidence of significant proximal stenosis. Internal carotid arteries are patent from skullbase to carotid termini without evidence of significant stenosis. ACAs and right MCA are patent without evidence of major branch occlusion or flow limiting proximal stenosis. The left M1 segment is patent with at most mild narrowing distally. There is occlusion of the left M2 inferior division at its origin. There is mild reconstitution of flow and some more distal branches. Flow is present in the left M2 superior division, though it has an attenuated appearance. No intracranial aneurysm is identified. IMPRESSION: 1. Occlusion of the left M2 inferior division. Note that the acute infarct on yesterday's MRI is predominantly in the lateral lenticulostriate territory however. 2. Motion degraded neck MRA with limited assessment of the large vessels in the upper chest and lower neck. No evidence of flow limiting cervical carotid or vertebral artery stenosis within this limitation. Electronically Signed   By: Sebastian Ache M.D.   On: 05/10/2016 08:08   Mr Brain Wo Contrast  Result Date:  2016/06/02 CLINICAL DATA:  Fall with right-sided weakness. EXAM: MRI HEAD WITHOUT CONTRAST TECHNIQUE: Multiplanar, multiecho pulse sequences of the brain and surrounding structures were obtained without intravenous contrast. COMPARISON:  Head CT Jun 02, 2016 Brain MR 04/26/2016 FINDINGS: Brain: There are areas of diffusion restriction within the left caudate head, extending into the putamen; the left frontal operculum; and of the subinsular white matter. The abnormality is in close proximity to the posterior limb of the internal capsule. There is also a possible focus of diffusion restriction within the left cerebral peduncle. No acute hemorrhage. There is beginning confluent hyperintense T2-weighted signal within the periventricular white matter, most often seen in the setting of chronic microvascular ischemia. No mass lesion or midline shift. No hydrocephalus or extra-axial fluid collection. The midline structures are normal. No age advanced or lobar predominant atrophy. Vascular: Major intracranial arterial and venous sinus flow voids are preserved. No evidence of chronic microhemorrhage or amyloid angiopathy. Skull and upper cervical spine: The visualized skull base, calvarium, upper cervical spine and extracranial soft tissues are normal. Sinuses/Orbits: Bilateral mastoid effusions. Paranasal sinuses are clear. Normal orbits. IMPRESSION: 1. Acute infarct within the left subinsular white matter, left frontal operculum and left basal ganglia (predominantly the caudate head and putamen). No hemorrhage, midline shift or mass effect. 2. Punctate focus of possible ischemia within the left cerebral peduncle. 3. Chronic microvascular disease. 4. Bilateral mastoid effusions. Electronically Signed   By: Deatra Robinson M.D.   On: Jun 02, 2016 05:39   Mr Brain Wo Contrast  Result Date: 04/26/2016 CLINICAL DATA:  Worsening hearing loss over the past 4-5 days. Stroke last month. EXAM: MRI HEAD WITHOUT CONTRAST TECHNIQUE:  Multiplanar, multiecho pulse sequences of the brain and surrounding structures were obtained without intravenous contrast. COMPARISON:  04/10/2016 FINDINGS: Brain: Small volume blood products are again seen in the mesial left temporal lobe extending to the region of inferior basal ganglia/ genu of the left internal capsule. Minimal residual diffusion signal abnormality is present in these locations related to the blood products. Abnormal trace diffusion signal elsewhere in the left greater than right cerebral hemispheres and left cerebellum on the prior study has resolved. There is minimal T2/FLAIR hyperintensity in the mesial left temporal lobe/ basal ganglia region and left  temporal occipital region corresponding to the previously demonstrated infarcts. There is no evidence of acute infarct, new intracranial hemorrhage, mass, midline shift, or extra-axial fluid collection. Bilateral periventricular white matter T2 hyperintensities are unchanged and nonspecific but compatible with mild chronic small vessel ischemic disease. There is moderate cerebral atrophy. Vascular: Major intracranial vascular flow voids are preserved. Skull and upper cervical spine: Unremarkable bone marrow signal. Sinuses/Orbits: Unremarkable orbits. Minimal posterior right ethmoid air cell mucosal thickening. Large bilateral mastoid effusions, increased from prior. Fluid is now present in the middle ear cavities bilaterally. Other: None. IMPRESSION: 1. Residual small volume blood products in the mesial left temporal lobe and left internal capsule/inferior basal ganglia region associated with subacute infarcts demonstrated on the prior MRI. 2. Resolved diffusion abnormality associated with the small infarcts elsewhere. 3. No acute infarct or new intracranial abnormality. 4. Large bilateral mastoid and middle ear effusions. Electronically Signed   By: Sebastian Ache M.D.   On: 04/26/2016 19:03   Dg Chest Port 1 View  Result Date:  04/23/2016 CLINICAL DATA:  Shortness of Breath following recent fall EXAM: PORTABLE CHEST 1 VIEW COMPARISON:  05/14/2016 FINDINGS: Cardiac shadow is stable. Bibasilar atelectatic changes are noted slightly more prominent than that seen on the prior exam. No pneumothorax or sizable effusion is seen. No bony abnormality is noted. IMPRESSION: Mild bibasilar atelectasis. Electronically Signed   By: Alcide Clever M.D.   On: 05/12/2016 21:56   Dg Chest Port 1 View  Result Date: 04/23/2016 CLINICAL DATA:  Aspiration pneumonia EXAM: PORTABLE CHEST 1 VIEW COMPARISON:  04/23/2016 at 12:20 FINDINGS: Mild streaky opacity in the right days. Left lung is clear. Pulmonary vasculature is normal. No effusions. Hilar, mediastinal and cardiac contours are unremarkable and unchanged. IMPRESSION: Streaky right base opacity. No focal confluent consolidation. No significant change in the interim. Electronically Signed   By: Ellery Plunk M.D.   On: 04/23/2016 23:41   Dg Chest Port 1 View  Result Date: 04/23/2016 CLINICAL DATA:  COPD, aspiration pneumonia. EXAM: PORTABLE CHEST 1 VIEW COMPARISON:  Portable chest x-ray of Sep 20, 2015 FINDINGS: The lungs are mildly hyperinflated. The interstitial markings are coarse. Increased density at the right lung base is consistent with the clinically suspected aspiration pneumonia. The heart and pulmonary vascularity are normal. There is calcification in the wall of the tortuous thoracic aorta. There is no pleural effusion. The bony thorax exhibits no acute abnormality. IMPRESSION: Right basilar pneumonia consistent with aspiration. COPD. Thoracic aortic atherosclerosis. When the patient can tolerate the procedure, a PA and lateral chest x-ray would be useful. Electronically Signed   By: David  [place] M.D.   On: 04/23/2016 12:34   Dg Chest Port 1 View  Result Date: 04/21/2016 CLINICAL DATA:  Initial evaluation for acute respiratory distress, wheezing. History of COPD. EXAM: PORTABLE  CHEST 1 VIEW COMPARISON:  Prior radiograph from 04/21/2016. FINDINGS: Cardiac and mediastinal silhouettes are stable in size and contour, and remain within normal limits. Atherosclerotic disease noted within the aortic arch. Lungs are hypoinflated. Changes related COPD noted. Increased right basilar opacity favored to reflect atelectasis/bronchovascular crowding, although recurrent infiltrate not excluded. Left basilar atelectasis noted. Small left pleural effusion. No pulmonary edema or pneumothorax. No acute osseous abnormality. IMPRESSION: 1. Shallow lung inflation with increased right basilar opacity, favored to reflect atelectasis/ bronchovascular crowding, although recurrent infiltrate could be considered in the correct clinical setting. 2. Small left pleural effusion with associated atelectasis. 3. COPD. Electronically Signed   By: Janell Quiet.D.  On: 04/21/2016 20:38   Dg Chest Port 1 View  Result Date: 04/21/2016 CLINICAL DATA:  Follow-up wheezing, COPD. EXAM: PORTABLE CHEST 1 VIEW COMPARISON:  04/20/2016 FINDINGS: There is hyperinflation of the lungs compatible with COPD. Heart and mediastinal contours are within normal limits. No focal opacities or effusions. No acute bony abnormality. IMPRESSION: COPD.  No active disease. Electronically Signed   By: Charlett Nose M.D.   On: 04/21/2016 08:27   Dg Chest Port 1 View  Result Date: 04/20/2016 CLINICAL DATA:  Pneumonia. EXAM: PORTABLE CHEST 1 VIEW COMPARISON:  04/16/2016. FINDINGS: Mediastinum and hilar structures are normal. Heart size stable. Low lung volumes with basilar atelectasis. Slight improvement of right base infiltrate. A component of right base bronchiectasis cannot be excluded. Tiny left pleural effusion. No pneumothorax. IMPRESSION: 1. Interim partial clearing of right base infiltrate. A component of bronchiectasis in the right base cannot be excluded. 2. Low lung volumes.  Small left pleural effusion noted. Electronically  Signed   By: Maisie Fus  Register   On: 04/20/2016 07:20   Dg Chest Port 1 View  Result Date: 04/16/2016 CLINICAL DATA:  Acute onset of shortness of breath. Initial encounter. EXAM: PORTABLE CHEST 1 VIEW COMPARISON:  Chest radiograph performed 04/14/2016 FINDINGS: Persistent right basilar airspace opacity remains concerning for pneumonia. No definite pleural effusion or pneumothorax is seen. Mild vascular congestion is noted. The cardiomediastinal silhouette is mildly enlarged. No acute osseous abnormalities are identified. IMPRESSION: 1. Persistent right basilar airspace opacity remains concerning for pneumonia. 2. Mild vascular congestion and mild cardiomegaly. Electronically Signed   By: Roanna Raider M.D.   On: 04/16/2016 06:26   Dg Chest Port 1 View  Result Date: 04/14/2016 CLINICAL DATA:  Acute onset of shortness of breath and cough. Initial encounter. EXAM: PORTABLE CHEST 1 VIEW COMPARISON:  Chest radiograph performed 04/11/2016 FINDINGS: Right basilar airspace opacity raises concern for pneumonia. No pleural effusion or pneumothorax is seen. The cardiomediastinal silhouette is borderline normal in size. No acute osseous abnormalities are identified. IMPRESSION: Right basilar pneumonia noted. Electronically Signed   By: Roanna Raider M.D.   On: 04/14/2016 21:50   Dg Chest Port 1v Same Day  Result Date: 05/10/2016 CLINICAL DATA:  Dyspnea, history COPD, stroke EXAM: PORTABLE CHEST 1 VIEW COMPARISON:  Portable exam 0932 hours compared to 2016-06-06 FINDINGS: Upper normal heart size. Rotated to the RIGHT. Mediastinal contours and pulmonary vascularity normal. Atherosclerotic calcification aorta. Bronchitic changes with bibasilar atelectasis. Remaining lungs clear. No pleural effusion or pneumothorax. IMPRESSION: Bronchitic changes with bibasilar atelectasis. Aortic atherosclerosis. Electronically Signed   By: Ulyses Southward M.D.   On: 05/10/2016 09:53   Dg Knee Complete 4 Views Right  Result Date:  2016-06-06 CLINICAL DATA:  82 year old female with fall and right knee swelling. EXAM: RIGHT KNEE - COMPLETE 4+ VIEW COMPARISON:  None. FINDINGS: No acute fracture or dislocation. The bones are osteopenic. There is meniscal chondrocalcinosis with mild osteoarthritic changes of the knees. No joint effusion. The soft tissues appear unremarkable. IMPRESSION: No acute fracture or dislocation. Electronically Signed   By: Elgie Collard M.D.   On: June 06, 2016 03:04   Dg Swallowing Func-speech Pathology  Result Date: 04/17/2016 Objective Swallowing Evaluation: Type of Study: MBS-Modified Barium Swallow Study Patient Details Name: Phylicia Mcgaugh MRN: [id number] Date of Birth: 01/11/1928 Today's Date: 04/17/2016 Time: SLP Start Time (ACUTE ONLY): 1045-SLP Stop Time (ACUTE ONLY): 1115 SLP Time Calculation (min) (ACUTE ONLY): 30 min Past Medical History: Past Medical History: Diagnosis Date . COPD (chronic obstructive pulmonary disease) (  HCC)  Past Surgical History: Past Surgical History: Procedure Laterality Date . IR GENERIC HISTORICAL  04/09/2016  IR ANGIO INTRA EXTRACRAN SEL COM CAROTID INNOMINATE UNI R MOD SED 04/09/2016 Julieanne Cotton, MD [facility] . IR GENERIC HISTORICAL  04/09/2016  IR PERCUTANEOUS ART THROMBECTOMY/INFUSION INTRACRANIAL INC DIAG ANGIO 04/09/2016 Julieanne Cotton, MD [facility] . PELVIC FRACTURE SURGERY   . RADIOLOGY WITH ANESTHESIA N/A 04/09/2016  Procedure: RADIOLOGY WITH ANESTHESIA;  Surgeon: Julieanne Cotton, MD;  Location: [facility] OR;  Service: Radiology;  Laterality: N/A; . TEE WITHOUT CARDIOVERSION N/A 04/13/2016  Procedure: TRANSESOPHAGEAL ECHOCARDIOGRAM (TEE);  Surgeon: Thurmon Fair, MD;  Location: [facility] ENDOSCOPY;  Service: Cardiovascular;  Laterality: N/A; HPI: 82 y.o. right handed female with h/o COPD. Presented 04/10/2016 with sudden onset of right-sided weakness and inability to speak. CT/MRI showed small volume of scattered acute infarct in the left hemisphere primarily in the left  temporal and occipital lobes with petechial hemorrhage in the left temporal lobe. Pt discharged to CIR 11/28, however, later that night she became dyspneic and diaphoretic. She was given albuterol nebs with some improvement. CXR with RLL infiltrate. Subjective: pt awake, pleasant Assessment / Plan / Recommendation CHL IP CLINICAL IMPRESSIONS 04/17/2016 Therapy Diagnosis Mild oral phase dysphagia;Mild pharyngeal phase dysphagia Clinical Impression Pt's swallow function demonstrates mild improvements since [facility] 11/26.  She presents with piecemeal oral bolusing; persisting delay in initiation (valleculae and pyriforms) -however, this is not necessarily considered disordered in the elderly population.  There may have been questionable, high penetration of liquids, but it was difficult to discern.  No aspiration. Pyriform sinuses appear to be asymmetric and there is mild residue remaining post-swallow. Recommend advancing diet to dysphagia 3, nectar-thick liquids for now.  Will follow for functional toleration/safety.   Impact on safety and function Mild aspiration risk   CHL IP TREATMENT RECOMMENDATION 04/17/2016 Treatment Recommendations Therapy as outlined in treatment plan below   Prognosis 04/17/2016 Prognosis for Safe Diet Advancement Good Barriers to Reach Goals -- Barriers/Prognosis Comment -- CHL IP DIET RECOMMENDATION 04/17/2016 SLP Diet Recommendations Dysphagia 3 (Mech soft) solids;Nectar thick liquid Liquid Administration via Cup Medication Administration Whole meds with puree Compensations Small sips/bites;Slow rate Postural Changes --   CHL IP OTHER RECOMMENDATIONS 04/17/2016 Recommended Consults -- Oral Care Recommendations Oral care BID Other Recommendations --   CHL IP FOLLOW UP RECOMMENDATIONS 04/17/2016 Follow up Recommendations Inpatient Rehab   CHL IP FREQUENCY AND DURATION 04/17/2016 Speech Therapy Frequency (ACUTE ONLY) min 2x/week Treatment Duration 2 weeks      CHL IP ORAL PHASE 04/17/2016 Oral Phase  Impaired Oral - Pudding Teaspoon -- Oral - Pudding Cup -- Oral - Honey Teaspoon -- Oral - Honey Cup -- Oral - Nectar Teaspoon -- Oral - Nectar Cup -- Oral - Nectar Straw -- Oral - Thin Teaspoon -- Oral - Thin Cup -- Oral - Thin Straw -- Oral - Puree Piecemeal swallowing Oral - Mech Soft Piecemeal swallowing Oral - Regular -- Oral - Multi-Consistency -- Oral - Pill -- Oral Phase - Comment --  CHL IP PHARYNGEAL PHASE 04/17/2016 Pharyngeal Phase Impaired Pharyngeal- Pudding Teaspoon -- Pharyngeal -- Pharyngeal- Pudding Cup -- Pharyngeal -- Pharyngeal- Honey Teaspoon -- Pharyngeal -- Pharyngeal- Honey Cup NT Pharyngeal -- Pharyngeal- Nectar Teaspoon -- Pharyngeal -- Pharyngeal- Nectar Cup Pharyngeal residue - pyriform;Delayed swallow initiation-pyriform sinuses Pharyngeal Material enters airway, remains ABOVE vocal cords then ejected out Pharyngeal- Nectar Straw -- Pharyngeal -- Pharyngeal- Thin Teaspoon -- Pharyngeal -- Pharyngeal- Thin Cup Delayed swallow initiation-pyriform sinuses;Pharyngeal residue - pyriform Pharyngeal  Material enters airway, remains ABOVE vocal cords then ejected out Pharyngeal- Thin Straw -- Pharyngeal -- Pharyngeal- Puree Delayed swallow initiation-vallecula;Pharyngeal residue - pyriform Pharyngeal -- Pharyngeal- Mechanical Soft Delayed swallow initiation-vallecula;Pharyngeal residue - pyriform Pharyngeal -- Pharyngeal- Regular -- Pharyngeal -- Pharyngeal- Multi-consistency -- Pharyngeal -- Pharyngeal- Pill -- Pharyngeal -- Pharyngeal Comment --  CHL IP CERVICAL ESOPHAGEAL PHASE 04/12/2016 Cervical Esophageal Phase WFL Pudding Teaspoon -- Pudding Cup -- Honey Teaspoon -- Honey Cup -- Nectar Teaspoon -- Nectar Cup -- Nectar Straw -- Thin Teaspoon -- Thin Cup -- Thin Straw -- Puree -- Mechanical Soft -- Regular -- Multi-consistency -- Pill -- Cervical Esophageal Comment -- No flowsheet data found. Blenda Mounts Laurice 04/17/2016, 11:50 AM              Dg Swallowing Func-speech  Pathology  Result Date: 04/12/2016 Objective Swallowing Evaluation: Type of Study: MBS-Modified Barium Swallow Study Patient Details Name: Naseem Adler MRN: [id number] Date of Birth: April 01, 1928 Today's Date: 04/12/2016 Time: SLP Start Time (ACUTE ONLY): 1116-SLP Stop Time (ACUTE ONLY): 1131 SLP Time Calculation (min) (ACUTE ONLY): 15 min Past Medical History: Past Medical History: Diagnosis Date . COPD (chronic obstructive pulmonary disease) (HCC)  Past Surgical History: Past Surgical History: Procedure Laterality Date . PELVIC FRACTURE SURGERY   . RADIOLOGY WITH ANESTHESIA N/A 04/09/2016  Procedure: RADIOLOGY WITH ANESTHESIA;  Surgeon: Julieanne Cotton, MD;  Location: [facility] OR;  Service: Radiology;  Laterality: N/A; HPI: Ms Marlette is an 82 yo female with h/o COPD admitted with mental status change, right sided weakness, hemianopsia, left gaze preference,  Pt required intubation and was extubated yesterday.  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  Swallow and speech eval ordered Subjective: pt awake, pleasant Assessment / Plan / Recommendation CHL IP CLINICAL IMPRESSIONS 04/12/2016 Therapy Diagnosis Moderate pharyngeal phase dysphagia Clinical Impression Pt has a moderate pharyngeal dysphagia due in large part to delayed swallow initiation and decreased sensation of airway penetration. Liquids spill to the pyriform sinuses, resulting in deep, silent penetration of thin and nectar thick liquids. Although this does not occur with every bolus, it is silent and unable to be completely cleared with cued coughing, making her at a higher risk for an aspiration-related infection. Her strength is adequate with no significant residuals remaining post-swallow. Recommend Dys 3 diet and honey thick liquids. SLP to f/u for tolerance and readiness to advance. Impact on safety and function Moderate aspiration risk   CHL IP TREATMENT  RECOMMENDATION 04/12/2016 Treatment Recommendations Therapy as outlined in treatment plan below   Prognosis 04/12/2016 Prognosis for Safe Diet Advancement Good Barriers to Reach Goals -- Barriers/Prognosis Comment -- CHL IP DIET RECOMMENDATION 04/12/2016 SLP Diet Recommendations Dysphagia 3 (Mech soft) solids;Honey thick liquids Liquid Administration via Cup;No straw Medication Administration Whole meds with puree Compensations Slow rate;Small sips/bites Postural Changes Remain semi-upright after after feeds/meals (Comment);Seated upright at 90 degrees   CHL IP OTHER RECOMMENDATIONS 04/12/2016 Recommended Consults -- Oral Care Recommendations Oral care BID Other Recommendations Order thickener from pharmacy;Prohibited food (jello, ice cream, thin soups);Remove water pitcher   CHL IP FOLLOW UP RECOMMENDATIONS 04/12/2016 Follow up Recommendations Inpatient Rehab   CHL IP FREQUENCY AND DURATION 04/12/2016 Speech Therapy Frequency (ACUTE ONLY) min 2x/week Treatment Duration 2 weeks      CHL IP ORAL PHASE 04/12/2016 Oral Phase WFL Oral - Pudding Teaspoon -- Oral - Pudding Cup -- Oral - Honey Teaspoon -- Oral - Honey Cup -- Oral -  Nectar Teaspoon -- Oral - Nectar Cup -- Oral - Nectar Straw -- Oral - Thin Teaspoon -- Oral - Thin Cup -- Oral - Thin Straw -- Oral - Puree -- Oral - Mech Soft -- Oral - Regular -- Oral - Multi-Consistency -- Oral - Pill -- Oral Phase - Comment --  CHL IP PHARYNGEAL PHASE 04/12/2016 Pharyngeal Phase Impaired Pharyngeal- Pudding Teaspoon -- Pharyngeal -- Pharyngeal- Pudding Cup -- Pharyngeal -- Pharyngeal- Honey Teaspoon -- Pharyngeal -- Pharyngeal- Honey Cup Delayed swallow initiation-pyriform sinuses Pharyngeal -- Pharyngeal- Nectar Teaspoon -- Pharyngeal -- Pharyngeal- Nectar Cup Delayed swallow initiation-pyriform sinuses;Penetration/Aspiration before swallow Pharyngeal Material enters airway, remains ABOVE vocal cords and not ejected out Pharyngeal- Nectar Straw -- Pharyngeal -- Pharyngeal-  Thin Teaspoon -- Pharyngeal -- Pharyngeal- Thin Cup Delayed swallow initiation-pyriform sinuses;Penetration/Aspiration before swallow Pharyngeal Material enters airway, CONTACTS cords and not ejected out Pharyngeal- Thin Straw -- Pharyngeal -- Pharyngeal- Puree Delayed swallow initiation-vallecula Pharyngeal -- Pharyngeal- Mechanical Soft Delayed swallow initiation-vallecula Pharyngeal -- Pharyngeal- Regular -- Pharyngeal -- Pharyngeal- Multi-consistency -- Pharyngeal -- Pharyngeal- Pill -- Pharyngeal -- Pharyngeal Comment --  CHL IP CERVICAL ESOPHAGEAL PHASE 04/12/2016 Cervical Esophageal Phase WFL Pudding Teaspoon -- Pudding Cup -- Honey Teaspoon -- Honey Cup -- Nectar Teaspoon -- Nectar Cup -- Nectar Straw -- Thin Teaspoon -- Thin Cup -- Thin Straw -- Puree -- Mechanical Soft -- Regular -- Multi-consistency -- Pill -- Cervical Esophageal Comment -- No flowsheet data found. [name] 04/12/2016, 1:05 PM  [name], M.A. CCC-SLP [phone]              Lab Data:  CBC:  Recent Labs Lab 06-01-2016 0307 05/10/16 0853 05/11/16 0202 05/12/16 0255  WBC 11.8* 11.6* 11.8* 12.9*  NEUTROABS 9.4*  --   --   --   HGB 11.7* 11.7* 11.4* 11.7*  HCT 36.2 36.6 35.8* 36.5  MCV 86.6 85.9 86.1 86.3  PLT 196 220 218 215   Basic Metabolic Panel:  Recent Labs Lab 2016/06/01 0307 06-01-16 0801 05/10/16 0853 05/11/16 0202 05/12/16 0255  NA 136  --  138 139 139  K 3.5  --  3.8 3.4* 3.3*  CL 98*  --  99* 99* 103  CO2 29  --  29 29 28   GLUCOSE 127*  --  108* 109* 129*  BUN 15  --  16 18 14   CREATININE 0.83  --  0.81 0.85 0.75  CALCIUM 8.0*  --  7.8* 7.5* 7.4*  MG  --  1.8  --   --   --   PHOS  --  2.8  --   --   --    GFR: Estimated Creatinine Clearance: 47.8 mL/min (by C-G formula based on SCr of 0.75 mg/dL). Liver Function Tests:  Recent Labs Lab Jun 01, 2016 0307  AST 26  ALT 24  ALKPHOS 72  BILITOT 1.1  PROT 6.9  ALBUMIN 3.0*   No results for input(s): LIPASE, AMYLASE in the  last 168 hours.  Recent Labs Lab 05/10/16 0853  AMMONIA 15   Coagulation Profile:  Recent Labs Lab 06-01-2016 0801  INR 1.10   Cardiac Enzymes:  Recent Labs Lab 01-Jun-2016 0307  TROPONINI <0.03   BNP (last 3 results) No results for input(s): PROBNP in the last 8760 hours. HbA1C: No results for input(s): HGBA1C in the last 72 hours. CBG:  Recent Labs Lab 05/10/16 1722  GLUCAP 83   Lipid Profile:  Recent Labs  05/11/16 0202  CHOL 147  HDL 42  LDLCALC 86  TRIG 95  CHOLHDL 3.5   Thyroid Function Tests: No results for input(s): TSH, T4TOTAL, FREET4, T3FREE, THYROIDAB in the last 72 hours. Anemia Panel: No results for input(s): VITAMINB12, FOLATE, FERRITIN, TIBC, IRON, RETICCTPCT in the last 72 hours. Urine analysis:    Component Value Date/Time   COLORURINE YELLOW 04/26/2016 0419   APPEARANCEUR HAZY (A) 05/13/2016 0419   LABSPEC 1.020 05/15/2016 0419   PHURINE 5.0 04/19/2016 0419   GLUCOSEU NEGATIVE 05/13/2016 0419   HGBUR NEGATIVE 05/07/2016 0419   BILIRUBINUR NEGATIVE 05/11/2016 0419   KETONESUR NEGATIVE 05/05/2016 0419   PROTEINUR NEGATIVE 04/30/2016 0419   NITRITE NEGATIVE 04/20/2016 0419   LEUKOCYTESUR NEGATIVE 05/15/2016 0419     RAI,RIPUDEEP M.D. [facility] 05/12/2016, 11:22 AM  Pager: [phone] Between 7am to 7pm - call Pager - [phone]  After 7pm go to [email or web address] - password [email or web address]  Call night coverage person covering after 7pm

## 2016-05-12 NOTE — Progress Notes (Signed)
Notified EEG. MD request EEG to be completed.

## 2016-05-12 NOTE — Progress Notes (Signed)
OT Cancellation Note  Patient Details Name: Catherine ClossRegina Day MRN: [id number] DOB: 03/10/1928   Cancelled Treatment:    Reason Eval/Treat Not Completed: Patient at procedure or test/ unavailable (having EEG)  Pt having EEG. Will attempt to see tomorrow. Note Palliative care consult.  [facility] HospitalWARD,HILLARY  Nikodem Leadbetter, OTR/L  [phone] 05/12/2016 05/12/2016, 2:52 PM

## 2016-05-12 NOTE — Progress Notes (Signed)
STROKE TEAM PROGRESS NOTE   HISTORY OF PRESENT ILLNESS (per record) Catherine Day is a 82 y.o. female with a history of recent left carotid stenting and embolectomy following left MCA stroke. She did well following the stroke, and went to a nursing home without aphasia. Yesterday, she fell around 11:30 PM 05/08/16 ([facility]) and was noted to be aphasic. Last seen well was earlier in the evening, but no clear time was documented in the notes. An MRI obtained showed infarcts in subcortical left hemisphere. tPA was not given due to unclear time of onset.   SUBJECTIVE (INTERVAL HISTORY) Palliative care met with family yesterday - no clear decision about feeding, future care. Patient more awake, talking this am. Concerns related to aspiration continue. No new neuro changes, plan of care continues.    OBJECTIVE Temp:  [97.4 F (36.3 C)-98.8 F (37.1 C)] 98.2 F (36.8 C) (12/26 1152) Pulse Rate:  [74-92] 77 (12/26 1152) Cardiac Rhythm: Normal sinus rhythm (12/26 0724) Resp:  [21-32] 21 (12/26 1152) BP: (127-166)/(57-80) 127/65 (12/26 1152) SpO2:  [91 %-98 %] 94 % (12/26 1223)  CBC:   Recent Labs Lab 04/23/2016 0307  05/11/16 0202 05/12/16 0255  WBC 11.8*  < > 11.8* 12.9*  NEUTROABS 9.4*  --   --   --   HGB 11.7*  < > 11.4* 11.7*  HCT 36.2  < > 35.8* 36.5  MCV 86.6  < > 86.1 86.3  PLT 196  < > 218 215  < > = values in this interval not displayed.  Basic Metabolic Panel:   Recent Labs Lab 05/13/2016 0801  05/11/16 0202 05/12/16 0255  NA  --   < > 139 139  K  --   < > 3.4* 3.3*  CL  --   < > 99* 103  CO2  --   < > 29 28  GLUCOSE  --   < > 109* 129*  BUN  --   < > 18 14  CREATININE  --   < > 0.85 0.75  CALCIUM  --   < > 7.5* 7.4*  MG 1.8  --   --   --   PHOS 2.8  --   --   --   < > = values in this interval not displayed.  Lipid Panel:     Component Value Date/Time   CHOL 147 05/11/2016 0202   TRIG 95 05/11/2016 0202   HDL 42 05/11/2016 0202   CHOLHDL 3.5 05/11/2016 0202   VLDL 19 05/11/2016 0202   LDLCALC 86 05/11/2016 0202   HgbA1c:  Lab Results  Component Value Date   HGBA1C 5.8 (H) 04/10/2016    IMAGING  Ct Head Wo Contrast 05/10/2016 1. Evolving left MCA distribution subacute infarct. 2. Chronic mastoid fluid bilaterally.  05/13/2016 No acute intracranial hemorrhage.  Focal area of hypodensity extending from the lateral aspect of the left lateral ventricle inferiorly appears new since the prior CT and may represent a subacute or less likely an acute infarct.  Correlation with clinical exam recommended. MRI is recommended if there is clinical concern for acute infarct.  Moderate age-related atrophy and chronic microvascular ischemic changes.  No acute/ traumatic cervical spine pathology.   Ct Cervical Spine Wo Contrast 05/13/2016 No acute/ traumatic cervical spine pathology.  Mri / Mra Head and Neck Wo Contrast 05/10/2016 1. Occlusion of the left M2 inferior division.  Note that the acute infarct on yesterday's MRI is predominantly in the lateral lenticulostriate territory however.  2. Motion degraded neck MRA with limited assessment of the large vessels in the upper chest and lower neck.  No evidence of flow limiting cervical carotid or vertebral artery stenosis within this limitation.   Mri Brain Wo Contrast 04/25/2016 1. Acute infarct within the left subinsular white matter, left frontal operculum and left basal ganglia (predominantly the caudate head and putamen). No hemorrhage, midline shift or mass effect.  2. Punctate focus of possible ischemia within the left cerebral peduncle.  3. Chronic microvascular disease.  4. Bilateral mastoid effusions.    PHYSICAL EXAM frail elderly lady not in distress.Respirations slightly labored with upper airway secretions and bilateral rhonchi on auscultation CV: RRR, no MRG. No Carotid Bruits. No peripheral edema, warm, nontender Eyes: Conjunctivae clear without exudates or hemorrhage Mental  Status: Patient is awake and interactive speech is rambling and difficult to understand,   Follows simple midline commands. Cranial Nerves:    The pupils are equal, round, and reactive to light. Does not blink to threat. Conjugate gaze.  Right lower facial droop. +oculocephalic reflex, +corneals.  Tone:    Decreased right arm and right leg Motor:    Dense right hemiparesis, moves left side spontaneously Sensation: Does not w/d to stim on the right      ASSESSMENT/PLAN [name] is a 82 y.o. female with history of COPD, recent left middle cerebral artery stroke with left carotid stenting and embolectomy in November 2017, presenting with a recent fall and aphasia.  She did not receive IV t-PA due to unknown time of onset.  Stroke:  Dominant L MCA infarct embolic - source unknown  Resultant  Dense right hemiparesis, dysphagia  MRI - acute infarct within the left subinsular white matter, left frontal operculum and left basal ganglia  MRA head and neck - occlusion of the left M2 inferior division.   Repeat CT head evolving L MCA infarct. No new abnormality  2D Echo - TEE 04/13/2016 - EF 45 - 50%. No cardiac source of emboli identified.   LDL - 04/10/2016 - 86  HgbA1c - 04/10/2016 - 5.8  VTE prophylaxis Diet NPO time specified  No antithrombotic prior to admission, now on aspirin 300 mg suppository daily  Patient counseled to be compliant with her antithrombotic medications  Discussion with family pending discussion on palliative care, otherwise Loop recorder is recommended  Therapy recommendations: pending   Disposition:  Anticipate return to SNF  Hypertension  Stable BP goal normotensive  Hyperlipidemia  Home meds:  Lipitor 40 mg daily not resumed in hospital  LDL 86, goal < 70  Resume Lipitor when taking POs/tube placed  Continue statin at discharge  Other Stroke Risk Factors  Advanced age  Obesity, Body mass index is 30.18 kg/m., recommend weight  loss, diet and exercise as appropriate   Hx stroke/TIA  Chronic combined systolid and diastolic CHF  Other Active Problems  Encephalopathy - does nowt appear to be associated with stroke. Will check EEG - ordered pending   Mild leukocytosis 11.6 (afebrile)  COPD w/ hypoxic respiratory failure  Aspiration PNA  Hospital day # 3 I have personally examined this patient, reviewed notes, independently viewed imaging studies, participated in medical decision making and plan of care.ROS completed by me personally and pertinent positives fully documented  I have made any additions or clarifications directly to the above note.  Patient's prognosis is poor given recurrent strokes and were general medical condition. Await discussion of palliative care team with family about goals of care. Discussed with  Dr. Tana Coast. Greater than 50% time during this 25 minute visit was spent on coordination of care and goals of care discussion  Antony Contras, MD Medical Director [facility] Pager: [phone] 05/12/2016 2:26 PM    To contact Stroke Continuity provider, please refer to [email or web address] After hours, contact General Neurology

## 2016-05-12 NOTE — Progress Notes (Signed)
SLP Cancellation Note  Patient Details Name: Catherine ClossRegina Day MRN: [id number] DOB: 06/06/1927   Cancelled treatment:       Reason Eval/Treat Not Completed: Medical issues which prohibited therapy; nursing/MD deferred; pending family decision with palliative care vs nutritional support via TF; probable she is currently aspirating secretions; not following directives as well.   Elianah Karis,PAT, M.S., CCC-SLP 05/12/2016, 10:02 AM

## 2016-05-12 NOTE — Progress Notes (Signed)
Daily Progress Note   Patient Name: Catherine Day       Date: 05/12/2016 DOB: [birth year]  Age: 82 y.o. MRN#: [id number] Attending Physician: Mendel Corning, MD Primary Care Physician: No primary care provider on file. Admit Date: 04/27/2016  Reason for Consultation/Follow-up: Establishing goals of care  Subjective: Patient lethargic this morning. Using accessory muscles with rhonchi on ascultation.   This afternoon, I met with son, Saralyn Pilar. Spoke of my concern with her worsening cognitive and respiratory status today and that she is aspirating on her saliva. Discussed in detail continuing an aggressive medical intervention pathway versus a comfort measures pathway. At first, son tells me he would want his mother to have a feeding tube knowing it would keep her alive. I discussed the overall big picture of her new baseline with poor quality of life at a nursing home with increased risk for continued aspiration, infections, bed sores, and a cycle of rehospitalization. Saralyn Pilar seemed shocked to hear all of this but at the end of our conversation, he tells me he would rather have quality days with his mother over extending her life with a feeding tube. He tells me he is "leaning towards comfort" but needs to make this decision with his sister, who is in [place]. He plans to call her today to discuss our conversation.   Briefly spoke of comfort measures and transition to a hospice home if she was stable for transfer. Answered questions and offered support. Encouraged Saralyn Pilar to read [facility] Cho[facility] copy that was given, that includes information on feeding tubes, artificial nutrition/hydration, and comfort measures.   Length of Stay: 3  Current Medications: Scheduled Meds:  .  stroke: mapping our early stages  of recovery book   Does not apply Once  . aspirin  300 mg Rectal Daily   Or  . aspirin  325 mg Oral Daily  . Chlorhexidine Gluconate Cloth  6 each Topical Q0600  . enoxaparin (LOVENOX) injection  40 mg Subcutaneous Q24H  . fluticasone furoate-vilanterol  1 puff Inhalation Daily  . ipratropium-albuterol  3 mL Nebulization QID  . mupirocin ointment  1 application Nasal BID  . nystatin   Topical TID  . piperacillin-tazobactam (ZOSYN)  IV  3.375 g Intravenous Q8H    Continuous Infusions: . dextrose 5 % and 0.45 % NaCl with KCl  20 mEq/L 50 mL/hr at 05/11/16 1226    PRN Meds: acetaminophen **OR** acetaminophen (TYLENOL) oral liquid 160 mg/5 mL **OR** acetaminophen, ipratropium-albuterol, senna-docusate  Physical Exam  Constitutional: She is easily aroused. She appears ill.  HENT:  Head: Normocephalic and atraumatic.  Cardiovascular: Regular rhythm and normal heart sounds.   Pulmonary/Chest: Accessory muscle usage present. She has rhonchi.  Abdominal: Normal appearance.  Neurological: She is easily aroused. She is disoriented.  Skin:  BLE cool. Toes beginning to mottle  Psychiatric: Cognition and memory are impaired. She is noncommunicative. She is inattentive.  Nursing note and vitals reviewed.          Vital Signs: BP 127/65   Pulse 77   Temp 98.2 F (36.8 C) (Oral)   Resp (!) 21   Ht '5\' 2"'  (1.575 m)   Wt 74.8 kg (165 lb)   SpO2 94%   BMI 30.18 kg/m  SpO2: SpO2: 94 % O2 Device: O2 Device: Nasal Cannula O2 Flow Rate: O2 Flow Rate (L/min): 2 L/min  Intake/output summary:   Intake/Output Summary (Last 24 hours) at 05/12/16 1418 Last data filed at 05/11/16 1700  Gross per 24 hour  Intake                0 ml  Output                0 ml  Net                0 ml   LBM: Last BM Date:  (PTA) Baseline Weight: Weight: 73.9 kg (163 lb) Most recent weight: Weight: 74.8 kg (165 lb)       Palliative Assessment/Data: PPS 10%  Flowsheet Rows   Flowsheet Row Most Recent  Value  Intake Tab  Referral Department  Hospitalist  Unit at Time of Referral  Intermediate Care Unit  Palliative Care Primary Diagnosis  Neurology  Palliative Care Type  New Palliative care  Reason for referral  Clarify Goals of Care  Date of Admission  05/06/2016  Date first seen by Palliative Care  05/11/16  Clinical Assessment  Palliative Performance Scale Score  10%  Psychosocial & Spiritual Assessment  Palliative Care Outcomes  Patient/Family meeting held?  Yes  Who was at the meeting?  son  Palliative Care Outcomes  Clarified goals of care, Provided end of life care assistance, Provided psychosocial or spiritual support, Counseled regarding hospice      Patient Active Problem List   Diagnosis Date Noted  . Palliative care encounter   . Goals of care, counseling/discussion   . At high risk for aspiration   . Cerebrovascular accident (CVA) ([place])   . Acute encephalopathy 04/23/2016  . UTI due to extended-spectrum beta lactamase (ESBL) producing Escherichia coli   . Chronic combined systolic and diastolic CHF, NYHA class 1 ([place])   . Cerebrovascular accident (CVA) due to bilateral thrombosis of vertebral arteries ([place])   . Hypertension 04/14/2016  . Hyperlipidemia 04/14/2016  . 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 Dysphagia 04/14/2016  . Chronic obstructive pulmonary disease ([place])   . Hypokalemia   . Recent Embolic stroke ([place]) - B MCA and L cerebellar     Palliative Care Assessment & Plan   Patient Profile: 82 y.o. female  with past medical history of COPD on home oxygen and recent stroke in November admitted on 04/30/2016 with fall and worsened right sided deficits. Recent hospitalization for stroke requiring tPA and complete revascularization of occluded left MCA,  left ACA, and left ICA in interventional radiology. This hospitalization was complicated by ESBL positive UTI and aspiration pneumonia. She was discharged to a nursing facility. MRI from  this admit showed infarcts in subcortical left hemisphere. MRA with occlusion of the left M2 division. Repeat CT showed evolving left MCA distribution subacute infarct. PT/OT evaluations pending. Patient failed speech swallow evaluation on 12/24. Palliative medicine consultation for goals of care.   Assessment: Acute CVA in the setting of recent embolic stroke Acute encephalopathy Hypoxic respiratory failure Aspiration pneumonia Chronic combined systolic and diastolic CHF COPD-O2 dependent Dysphagia  Recommendations/Plan:  DNR/DNI  Discussed in detail continuing aggressive medical interventions versus a comfort measure pathway. Son leaning towards comfort measures only but would like to talk with his sister first before making a decision.   Hold on NGT for the next 24 hours. Most likely transition to comfort tomorrow.   PMT will f/u on 12/27.   Goals of Care and Additional Recommendations:  Limitations on Scope of Treatment: Full Scope Treatment-except DNR/DNI  Code Status: DNR   Code Status Orders        Start     Ordered   05/08/2016 0909  Do not attempt resuscitation (DNR)  Continuous    Question Answer Comment  In the event of cardiac or respiratory ARREST Do not call a "code blue"   In the event of cardiac or respiratory ARREST Do not perform Intubation, CPR, defibrillation or ACLS   In the event of cardiac or respiratory ARREST Use medication by any route, position, wound care, and other measures to relive pain and suffering. May use oxygen, suction and manual treatment of airway obstruction as needed for comfort.      04/30/2016 0908    Code Status History    Date Active Date Inactive Code Status Order ID Comments User Context   05/03/2016  8:40 AM 04/20/2016  9:08 AM Full Code [id number]  Samella Parr, NP Inpatient   05/04/2016  8:18 AM 04/21/2016  8:40 AM DNR [id number]  Samella Parr, NP ED   04/24/2016  4:55 PM 04/27/2016  7:50 PM DNR [id number]  Orson Eva, MD  Inpatient   04/15/2016 12:51 AM 04/24/2016  4:54 PM Full Code [id number]  Corey Harold, NP Inpatient   04/14/2016  2:58 PM 04/14/2016  2:58 PM Full Code [id number]  Cathlyn Parsons, PA-C Inpatient   04/14/2016  2:58 PM 04/15/2016 12:51 AM Full Code [id number]  Cathlyn Parsons, PA-C Inpatient   04/09/2016 11:12 PM 04/10/2016  9:15 AM Full Code [id number]  Luanne Bras, MD Inpatient   04/09/2016 11:12 PM 04/09/2016 11:12 PM Full Code [id number]  Greta Doom, MD Inpatient    Advance Directive Documentation   Flowsheet Row Most Recent Value  Type of Advance Directive  Out of facility DNR (pink MOST or yellow form), Healthcare Power of Attorney  Pre-existing out of facility DNR order (yellow form or pink MOST form)  Yellow form placed in chart (order not valid for inpatient use)  "MOST" Form in Place?  No data       Prognosis:   Unable to determine  Discharge Planning:  To Be Determined  Care plan was discussed with son, RN, and Dr. Tana Coast  Thank you for allowing the Palliative Medicine Team to assist in the care of this patient.   Time In: 1300 Time Out: 1345 Total Time 87mn Prolonged Time Billed  no       Greater than 50%  of this  time was spent counseling and coordinating care related to the above assessment and plan.  Ihor Dow, FNP-C Palliative Medicine Team  Phone: [phone]  Fax: [phone]  Please contact Palliative Medicine Team phone at [phone] for questions and concerns.

## 2016-05-13 DIAGNOSIS — J441 Chronic obstructive pulmonary disease with (acute) exacerbation: Secondary | ICD-10-CM

## 2016-05-13 DIAGNOSIS — I472 Ventricular tachycardia: Secondary | ICD-10-CM

## 2016-05-13 DIAGNOSIS — E876 Hypokalemia: Secondary | ICD-10-CM

## 2016-05-13 DIAGNOSIS — Z9981 Dependence on supplemental oxygen: Secondary | ICD-10-CM

## 2016-05-13 DIAGNOSIS — J9611 Chronic respiratory failure with hypoxia: Secondary | ICD-10-CM

## 2016-05-13 LAB — CBC
HEMATOCRIT: 34.9 % — AB (ref 36.0–46.0)
HEMOGLOBIN: 10.9 g/dL — AB (ref 12.0–15.0)
MCH: 27.2 pg (ref 26.0–34.0)
MCHC: 31.2 g/dL (ref 30.0–36.0)
MCV: 87 fL (ref 78.0–100.0)
Platelets: 217 10*3/uL (ref 150–400)
RBC: 4.01 MIL/uL (ref 3.87–5.11)
RDW: 15.8 % — ABNORMAL HIGH (ref 11.5–15.5)
WBC: 10.9 10*3/uL — AB (ref 4.0–10.5)

## 2016-05-13 LAB — BASIC METABOLIC PANEL
ANION GAP: 12 (ref 5–15)
BUN: 17 mg/dL (ref 6–20)
CALCIUM: 7.1 mg/dL — AB (ref 8.9–10.3)
CHLORIDE: 105 mmol/L (ref 101–111)
CO2: 25 mmol/L (ref 22–32)
Creatinine, Ser: 0.7 mg/dL (ref 0.44–1.00)
GFR calc Af Amer: >60 mL/min (ref >60)
GFR calc non Af Amer: >60 mL/min (ref >60)
Glucose, Bld: 148 mg/dL — ABNORMAL HIGH (ref 65–99)
POTASSIUM: 3.6 mmol/L (ref 3.5–5.1)
Sodium: 142 mmol/L (ref 135–145)

## 2016-05-13 MED ORDER — GLYCOPYRROLATE 0.2 MG/ML IJ SOLN
0.1000 mg | INTRAMUSCULAR | Status: DC | PRN
  Administered 2016-05-15 – 2016-05-20 (×5): 0.1 mg via INTRAVENOUS
  Filled 2016-05-13 (×5): qty 1

## 2016-05-13 NOTE — Evaluation (Signed)
Physical Therapy Evaluation Patient Details Name: Hughie ClossRegina Yackley MRN: [id number] DOB: 09/06/1927 Today's Date: 05/13/2016   History of Present Illness  82 y.o. female admitted on Mar 07, 2016 for fall (recurrent stroke and worsened R side deficits) with aspiration pneumonia  Clinical Impression  Patient seen for evaluation s/p recurrent stroke. Patient demonstrates deficits in functional mobility as indicated below. Will need continued skilled PT to address deficits and maximize function. Will see as indicated and progress as tolerated. At this time, patient non verbal but is appropriate with some yes no responses and is able to follow very simple commands on left side with increased time.     Follow Up Recommendations SNF    Equipment Recommendations  None recommended by PT    Recommendations for Other Services       Precautions / Restrictions Precautions Precautions: Fall Precaution Comments: no 2 liters O2 Restrictions Weight Bearing Restrictions: No      Mobility  Bed Mobility Overal bed mobility: Needs Assistance Bed Mobility: Rolling;Supine to Sit;Sit to Supine Rolling: Total assist   Supine to sit: Max assist Sit to supine: Max assist   General bed mobility comments: Patient able to initiate some LUE movement to hold therapist when elevating to upright, patient also able to initate laying down on command but required max assist for LE elevation and repositioning  Transfers                 General transfer comment: did not attempt  Ambulation/Gait                Stairs            Wheelchair Mobility    Modified Rankin (Stroke Patients Only)       Balance Overall balance assessment: Needs assistance Sitting-balance support: Feet supported;Single extremity supported Sitting balance-Leahy Scale: Poor Sitting balance - Comments: right lateral lean, was able to show some functional use of LE when prompted to lean to left Postural control: Right  lateral lean                                   Pertinent Vitals/Pain Pain Assessment: Faces Faces Pain Scale: Hurts little more Pain Location: generalized pain and left shoulder pain  Pain Descriptors / Indicators: Grimacing;Discomfort;Guarding Pain Intervention(s): Monitored during session    Home Living Family/patient expects to be discharged to:: Skilled nursing facility                      Prior Function           Comments: patient was at rehabilitation center recovering from recent stroke     Hand Dominance   Dominant Hand: Right    Extremity/Trunk Assessment   Upper Extremity Assessment Upper Extremity Assessment: Defer to OT evaluation    Lower Extremity Assessment Lower Extremity Assessment: Generalized weakness;RLE deficits/detail;LLE deficits/detail RLE Deficits / Details: trace movement noted in right LE but not to command RLE Coordination: decreased fine motor;decreased gross motor LLE Deficits / Details: 2/5 strength proximal LLE, 3/5 distal LE LLE Coordination: decreased fine motor;decreased gross motor    Cervical / Trunk Assessment Cervical / Trunk Assessment: Kyphotic  Communication   Communication: No difficulties  Cognition Arousal/Alertness: Awake/alert Behavior During Therapy: Flat affect Overall Cognitive Status: Difficult to assess Area of Impairment: Following commands;Safety/judgement;Awareness   Current Attention Level: Sustained   Following Commands: Follows one step commands inconsistently;Follows one step commands  with increased time            General Comments General comments (skin integrity, edema, etc.): saturations and VS stable     Exercises     Assessment/Plan    PT Assessment Patient needs continued PT services  PT Problem List Decreased strength;Decreased activity tolerance;Decreased balance;Decreased mobility;Decreased knowledge of use of DME;Cardiopulmonary status limiting activity           PT Treatment Interventions DME instruction;Gait training;Stair training;Functional mobility training;Therapeutic activities;Balance training;Neuromuscular re-education;Cognitive remediation;Patient/family education    PT Goals (Current goals can be found in the Care Plan section)  Acute Rehab PT Goals Patient Stated Goal: none stated PT Goal Formulation: With patient Time For Goal Achievement: 05/27/16 Potential to Achieve Goals: Fair    Frequency Min 2X/week   Barriers to discharge        Co-evaluation               End of Session Equipment Utilized During Treatment: Oxygen Activity Tolerance: Patient tolerated treatment well Patient left: in bed;with call bell/phone within reach;with bed alarm set Nurse Communication: Mobility status         Time: [id number] PT Time Calculation (min) (ACUTE ONLY): 15 min   Charges:   PT Evaluation $PT Eval Moderate Complexity: 1 Procedure     PT G Codes:        Fabio AsaDevon J Denaya Horn 05/13/2016, 11:24 AM Charlotte Crumbevon Gorgeous [place], PT DPT  [phone]

## 2016-05-13 NOTE — Progress Notes (Signed)
Speech Language Pathology Treatment: Dysphagia  Patient Details Name: Catherine Day MRN: [id number] DOB: 03/24/1928 Today's Date: 05/13/2016 Time: [id number] SLP Time Calculation (min) (ACUTE ONLY): 15 min  Assessment / Plan / Recommendation Clinical Impression  Skilled treatment session focused on dysphagia goals. SLP facilitated session by assessing pt's readiness for PO consumption. Pt aroused/alert to verbal interaction however she remained nonverbal throughout session. Pt with wet exhalations and unable to elicit swallow with Max A verbal and tactile cues. Information provided to nursing and MD regarding continued recommendation for NPO. Will continue to follow for PO readiness pending family's decision on palliative/comfort measures.    HPI HPI: Catherine Day is a 82 year-old female with recent protracted hospitalization for left-sided embolic stroke, COPD, O2 dependent, prior hospitalization course complicated by ESBL positive Escherichia coli UTI and aspiration pneumonia. She was discharged to skilled nursing facility.  Patient was sent to the ER after a fall. She was last seen normal earlier in the evening without specific time given. Per ER notes the patient fell about 11:30 PM and at that time it was discovered that the patient was not moving the right side as per previous baseline. MRI showed acute infarcts within the left subinsular white matter, left frontal operculum the left basal ganglia with an area of punctate possible ischemia in the left cerebral peduncle.      SLP Plan    Continue to follow pending family decision on Palliative Care    Recommendations   NPO                Oral Care Recommendations: Oral care QID       GO                Catherine Day 05/13/2016, 10:49 AM

## 2016-05-13 NOTE — Progress Notes (Signed)
Daily Progress Note   Patient Name: Catherine Day       Date: 05/13/2016 DOB: [birth year]  Age: 82 y.o. MRN#: [id number] Attending Physician: Florencia Reasons, MD Primary Care Physician: No primary care provider on file. Admit Date: 05/16/2016  Reason for Consultation/Follow-up: Establishing goals of care  Subjective: Catherine Day was initially sleeping when I entered the room, but did wake up to voice. When awake she tracked me with her eyes. She smiled and shrugged her shoulders to all questions and simple commands. She was able to move her left side with intentional movement, however no observed movement on right (nor could I elicit any). She did attempt to verbalize, but she only produced gurgling sounds. Increased respiratory rate and effort noted, with audible secretions.  Length of Stay: 4  Current Medications: Scheduled Meds:  .  stroke: mapping our early stages of recovery book   Does not apply Once  . aspirin  300 mg Rectal Daily   Or  . aspirin  325 mg Oral Daily  . Chlorhexidine Gluconate Cloth  6 each Topical Q0600  . enoxaparin (LOVENOX) injection  40 mg Subcutaneous Q24H  . fluticasone furoate-vilanterol  1 puff Inhalation Daily  . ipratropium-albuterol  3 mL Nebulization QID  . mupirocin ointment  1 application Nasal BID  . nystatin   Topical TID  . piperacillin-tazobactam (ZOSYN)  IV  3.375 g Intravenous Q8H    Continuous Infusions: . dextrose 5 % and 0.45 % NaCl with KCl 20 mEq/L 50 mL/hr at 05/12/16 1623    PRN Meds: acetaminophen **OR** acetaminophen (TYLENOL) oral liquid 160 mg/5 mL **OR** acetaminophen, ipratropium-albuterol, senna-docusate  Physical Exam  Constitutional: She is easily aroused. She appears ill. No distress.  HENT:  Head: Normocephalic and atraumatic.    Eyes: EOM are normal.  Neck: Normal range of motion.  Cardiovascular: Normal rate, regular rhythm and normal heart sounds.   Pulmonary/Chest: Accessory muscle usage present. She has rhonchi.  Audible secretions  Abdominal: Normal appearance.  Neurological: She is easily aroused.  Consistently smiles and shrugs her shoulders to any questions or commands.   Skin: Skin is warm and dry. There is pallor.  BLE cool. Slight mottling on toes bilaterally.  Psychiatric: She is noncommunicative. She is inattentive.  Nursing note and vitals reviewed.  Vital Signs: BP 121/61 (BP Location: Left Arm)   Pulse 75   Temp 97.7 F (36.5 C) (Axillary)   Resp (!) 23   Ht _0  (1.575 m)   Wt 74.8 kg (165 lb)   SpO2 98%   BMI 30.18 kg/m  SpO2: SpO2: 98 % O2 Device: O2 Device: Nasal Cannula O2 Flow Rate: O2 Flow Rate (L/min): 2 L/min  Intake/output summary:   Intake/Output Summary (Last 24 hours) at 05/13/16 1029 Last data filed at 05/13/16 0600  Gross per 24 hour  Intake              980 ml  Output                0 ml  Net              980 ml   LBM: Last BM Date:  (PTA) Baseline Weight: Weight: 73.9 kg (163 lb) Most recent weight: Weight: 74.8 kg (165 lb)       Palliative Assessment/Data: PPS 10%  Flowsheet Rows   Flowsheet Row Most Recent Value  Intake Tab  Referral Department  Hospitalist  Unit at Time of Referral  Intermediate Care Unit  Palliative Care Primary Diagnosis  Neurology  Palliative Care Type  New Palliative care  Reason for referral  Clarify Goals of Care  Date of Admission  04/26/2016  Date first seen by Palliative Care  05/11/16  Clinical Assessment  Palliative Performance Scale Score  10%  Psychosocial & Spiritual Assessment  Palliative Care Outcomes  Patient/Family meeting held?  Yes  Who was at the meeting?  son  Palliative Care Outcomes  Clarified goals of care, Provided end of life care assistance, Provided psychosocial or spiritual support,  Counseled regarding hospice      Patient Active Problem List   Diagnosis Date Noted  . Palliative care encounter   . Goals of care, counseling/discussion   . At high risk for aspiration   . Cerebrovascular accident (CVA) ([place])   . Acute encephalopathy 04/23/2016  . UTI due to extended-spectrum beta lactamase (ESBL) producing Escherichia coli   . Chronic combined systolic and diastolic CHF, NYHA class 1 ([place])   . Cerebrovascular accident (CVA) due to bilateral thrombosis of vertebral arteries ([place])   . Hypertension 04/14/2016  . Hyperlipidemia 04/14/2016  . 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 Dysphagia 04/14/2016  . Chronic obstructive pulmonary disease ([place])   . Hypokalemia   . Recent Embolic stroke ([place]) - B MCA and L cerebellar     Palliative Care Assessment & Plan   Patient Profile: 82 y.o. female  with past medical history of COPD on home oxygen and recent stroke in November admitted on 04/30/2016 with fall and worsened right sided deficits. Recent hospitalization for stroke requiring tPA and complete revascularization of occluded left MCA, left ACA, and left ICA in interventional radiology. This hospitalization was complicated by ESBL positive UTI and aspiration pneumonia. She was discharged to a nursing facility. MRI from this admit showed infarcts in subcortical left hemisphere. MRA with occlusion of the left M2 division. Repeat CT showed evolving left MCA distribution subacute infarct. PT/OT evaluations pending. Patient failed speech swallow evaluation on 12/24. Palliative medicine consultation for goals of care.   Assessment: This is my first time meeting Catherine Day, however she does appear more alert compared to prior documentation. That said, she continues to demonstrate what appears to be a global aphasia. She also continues to gargle her own secretions.  She did fail her swallow study done on 12/24. My partner, NP Cornelia Copa met with the patient's son on  12/27 and discussed the options moving forward, which included continuing aggressive medical interventions versus a comfort measure pathway. He had asked to think on this overnight and discuss with with his sister.  Today, I met with Catherine Day son and his wife at the patient's bedside. He had the Hard Choices book in hand and said he had been reading it. He had no questions or need for clarification. He has been talking with his sister in [place] yesterday and today, but they haven't been able to make a decision yet about direction of care (comfort vs aggressive management). He is hoping they will talk again tonight and be able to move forward with a decision.   Recommendations/Plan:  DNR/DNI; continue full scope treatment  Saralyn Pilar is working to come to a consensus with his sister in [place]; I will touch base with him again tomorrow. His sister has been given my number, though she has not reached out to talk with me yet.   Goals of Care and Additional Recommendations:  Limitations on Scope of Treatment: Full Scope Treatment-except DNR/DNI  Code Status: DNR   Code Status Orders        Start     Ordered   05/12/2016 0909  Do not attempt resuscitation (DNR)  Continuous    Question Answer Comment  In the event of cardiac or respiratory ARREST Do not call a "code blue"   In the event of cardiac or respiratory ARREST Do not perform Intubation, CPR, defibrillation or ACLS   In the event of cardiac or respiratory ARREST Use medication by any route, position, wound care, and other measures to relive pain and suffering. May use oxygen, suction and manual treatment of airway obstruction as needed for comfort.      05/04/2016 0908    Code Status History    Date Active Date Inactive Code Status Order ID Comments User Context   04/27/2016  8:40 AM 04/22/2016  9:08 AM Full Code [id number]  Samella Parr, NP Inpatient   04/29/2016  8:18 AM 04/28/2016  8:40 AM DNR [id number]  Samella Parr, NP ED    04/24/2016  4:55 PM 04/27/2016  7:50 PM DNR [id number]  Orson Eva, MD Inpatient   04/15/2016 12:51 AM 04/24/2016  4:54 PM Full Code [id number]  Corey Harold, NP Inpatient   04/14/2016  2:58 PM 04/14/2016  2:58 PM Full Code [id number]  Cathlyn Parsons, PA-C Inpatient   04/14/2016  2:58 PM 04/15/2016 12:51 AM Full Code [id number]  Cathlyn Parsons, PA-C Inpatient   04/09/2016 11:12 PM 04/10/2016  9:15 AM Full Code [id number]  Luanne Bras, MD Inpatient   04/09/2016 11:12 PM 04/09/2016 11:12 PM Full Code [id number]  Greta Doom, MD Inpatient    Advance Directive Documentation   Flowsheet Row Most Recent Value  Type of Advance Directive  Out of facility DNR (pink MOST or yellow form), Healthcare Power of Attorney  Pre-existing out of facility DNR order (yellow form or pink MOST form)  Yellow form placed in chart (order not valid for inpatient use)  "MOST" Form in Place?  No data       Prognosis:   < 2 weeks, pt is gurgling her own secretions. She is unable to eat/drink, with ongoing high risk for aspiration.  Discharge Planning:  To Be Determined  Care plan was discussed with pt's son and daughter-in-law.  Thank you for allowing the Palliative Medicine Team to assist in the care of this patient.   Time In: 1230 Time Out: 1245 Total Time 15 minutes Prolonged Time Billed  no       Greater than 50%  of this time was spent counseling and coordinating care related to the above assessment and plan.  Charlynn Court AGNP-C Palliative Medicine Team  Phone: [phone]    Please contact Palliative Medicine Team phone at [phone] for questions and concerns.

## 2016-05-13 NOTE — Progress Notes (Signed)
STROKE TEAM PROGRESS NOTE   HISTORY OF PRESENT ILLNESS (per record) Catherine Day is a 82 y.o. female with a history of recent left carotid stenting and embolectomy following left MCA stroke. She did well following the stroke, and went to a nursing home without aphasia. Yesterday, she fell around 11:30 PM 05/08/16 ([facility]) and was noted to be aphasic. Last seen well was earlier in the evening, but no clear time was documented in the notes. An MRI obtained showed infarcts in subcortical left hemisphere. tPA was not given due to unclear time of onset.   SUBJECTIVE (INTERVAL HISTORY) Palliative care met with family yesterday - no clear decision about feeding, future care. Patient more lethargic this am. Concerns related to aspiration continue. No new neuro changes, plan of care continues.    OBJECTIVE Temp:  [96.7 F (35.9 C)-98.4 F (36.9 C)] 96.7 F (35.9 C) (12/27 1229) Pulse Rate:  [73-79] 75 (12/27 0500) Cardiac Rhythm: Normal sinus rhythm (12/27 0915) Resp:  [17-24] 23 (12/27 0500) BP: (121-143)/(52-74) 129/52 (12/27 1229) SpO2:  [96 %-100 %] 96 % (12/27 1536)  CBC:   Recent Labs Lab 05/13/2016 0307  05/12/16 0255 05/13/16 0334  WBC 11.8*  < > 12.9* 10.9*  NEUTROABS 9.4*  --   --   --   HGB 11.7*  < > 11.7* 10.9*  HCT 36.2  < > 36.5 34.9*  MCV 86.6  < > 86.3 87.0  PLT 196  < > 215 217  < > = values in this interval not displayed.  Basic Metabolic Panel:   Recent Labs Lab 05/10/2016 0801  05/12/16 0255 05/13/16 0334  NA  --   < > 139 142  K  --   < > 3.3* 3.6  CL  --   < > 103 105  CO2  --   < > 28 25  GLUCOSE  --   < > 129* 148*  BUN  --   < > 14 17  CREATININE  --   < > 0.75 0.70  CALCIUM  --   < > 7.4* 7.1*  MG 1.8  --   --   --   PHOS 2.8  --   --   --   < > = values in this interval not displayed.  Lipid Panel:     Component Value Date/Time   CHOL 147 05/11/2016 0202   TRIG 95 05/11/2016 0202   HDL 42 05/11/2016 0202   CHOLHDL 3.5 05/11/2016 0202   VLDL 19  05/11/2016 0202   LDLCALC 86 05/11/2016 0202   HgbA1c:  Lab Results  Component Value Date   HGBA1C 5.8 (H) 04/10/2016    IMAGING  Ct Head Wo Contrast 05/10/2016 1. Evolving left MCA distribution subacute infarct. 2. Chronic mastoid fluid bilaterally.  05/13/2016 No acute intracranial hemorrhage.  Focal area of hypodensity extending from the lateral aspect of the left lateral ventricle inferiorly appears new since the prior CT and may represent a subacute or less likely an acute infarct.  Correlation with clinical exam recommended. MRI is recommended if there is clinical concern for acute infarct.  Moderate age-related atrophy and chronic microvascular ischemic changes.  No acute/ traumatic cervical spine pathology.   Ct Cervical Spine Wo Contrast 04/22/2016 No acute/ traumatic cervical spine pathology.  Mri / Mra Head and Neck Wo Contrast 05/10/2016 1. Occlusion of the left M2 inferior division.  Note that the acute infarct on yesterday's MRI is predominantly in the lateral lenticulostriate territory however.  2. Motion degraded neck MRA with limited assessment of the large vessels in the upper chest and lower neck.  No evidence of flow limiting cervical carotid or vertebral artery stenosis within this limitation.   Mri Brain Wo Contrast 05/03/2016 1. Acute infarct within the left subinsular white matter, left frontal operculum and left basal ganglia (predominantly the caudate head and putamen). No hemorrhage, midline shift or mass effect.  2. Punctate focus of possible ischemia within the left cerebral peduncle.  3. Chronic microvascular disease.  4. Bilateral mastoid effusions.    PHYSICAL EXAM frail elderly lady not in distress.Respirations slightly labored with upper airway secretions and bilateral rhonchi on auscultation CV: RRR, no MRG. No Carotid Bruits. No peripheral edema, warm, nontender Eyes: Conjunctivae clear without exudates or hemorrhage Mental  Status: Patient is awake and interactive speech is rambling and difficult to understand,   Follows simple midline commands. Cranial Nerves:    The pupils are equal, round, and reactive to light. Does not blink to threat. Conjugate gaze.  Right lower facial droop. +oculocephalic reflex, +corneals.  Tone:    Decreased right arm and right leg Motor:    Dense right hemiparesis, moves left side spontaneously Sensation: Does not w/d to stim on the right      ASSESSMENT/PLAN [name] is a 82 y.o. female with history of COPD, recent left middle cerebral artery stroke with left carotid stenting and embolectomy in November 2017, presenting with a recent fall and aphasia.  She did not receive IV t-PA due to unknown time of onset.  Stroke:  Dominant L MCA infarct embolic - source unknown  Resultant  Dense right hemiparesis, dysphagia  MRI - acute infarct within the left subinsular white matter, left frontal operculum and left basal ganglia  MRA head and neck - occlusion of the left M2 inferior division.   Repeat CT head evolving L MCA infarct. No new abnormality  2D Echo - TEE 04/13/2016 - EF 45 - 50%. No cardiac source of emboli identified.   LDL - 04/10/2016 - 86  HgbA1c - 04/10/2016 - 5.8  VTE prophylaxis Diet NPO time specified  No antithrombotic prior to admission, now on aspirin 300 mg suppository daily  Patient counseled to be compliant with her antithrombotic medications  Discussion with family pending discussion on palliative care, otherwise Loop recorder is recommended  Therapy recommendations: pending   Disposition:  Anticipate return to SNF  Hypertension  Stable BP goal normotensive  Hyperlipidemia  Home meds:  Lipitor 40 mg daily not resumed in hospital  LDL 86, goal < 70  Resume Lipitor when taking POs/tube placed  Continue statin at discharge  Other Stroke Risk Factors  Advanced age  Obesity, Body mass index is 30.18 kg/m., recommend weight  loss, diet and exercise as appropriate   Hx stroke/TIA  Chronic combined systolid and diastolic CHF  Other Active Problems  Encephalopathy - does nowt appear to be associated with stroke. Will check EEG - ordered pending   Mild leukocytosis 11.6 (afebrile)  COPD w/ hypoxic respiratory failure  Aspiration PNA  Hospital day # 4 I have personally examined this patient, reviewed notes, independently viewed imaging studies, participated in medical decision making and plan of care.ROS completed by me personally and pertinent positives fully documented  I have made any additions or clarifications directly to the above note.  Patient's prognosis is poor given recurrent strokes and were general medical condition. Await discussion of palliative care team with family about goals of care. Discussed with  RN at [facility]. Greater than 50% time during this 15 minute visit was spent on coordination of care and goals of care discussion  Antony Contras, [place] Pager: [phone] 05/13/2016 4:01 PM    To contact Stroke Continuity provider, please refer to [email or web address] After hours, contact General Neurology

## 2016-05-13 NOTE — Progress Notes (Signed)
[facility]                                                                              Patient Demographics  Catherine Day, is a 82 y.o. female, DOB - May 13, 1928, ZOX:[id number]  Admit date - 2016/05/15   Admitting Physician Haydee Salter, MD  Outpatient Primary MD for the patient is No primary care provider on file.  Outpatient specialists:   LOS - 4  days    Chief Complaint  Patient presents with  . Fall       Brief summary   Patient is a 82 year old female with recent protracted hospitalization for left-sided embolic stroke, COPD, O2 dependent, prior hospitalization course complicated by ESBL positive Escherichia coli UTI and aspiration pneumonia. She was discharged to skilled nursing facility.   Patient was sent to the ER after a fall. She was last seen normal earlier in the evening without specific time given. Per ER notes the patient fell about 11:30 PM and at that time it was discovered that the patient was not moving the right side as per previous baseline. MRI showed acute infarcts within the left subinsular white matter, left frontal operculum the left basal ganglia with an area of punctate possible ischemia in the left cerebral peduncle.   Assessment & Plan    Acute CVA in the setting of Recent Embolic stroke (HCC) - B MCA and L cerebellar/Cerebrovascular accident (CVA) due to bilateral thrombosis of vertebral arteries  -Patient recently admitted for acute stroke requiring tPA as well as complete revascularization of occluded left MCA, left ACA and left ICA terminus in interventional radiology. Baseline deficits included continued right lower extremity weakness with associated gait disturbance and mild to moderate right upper extremity weakness. -Patient now admitted with right-sided hemiparesis, flaccid with findings of new left side multiple area strokes on MRI, also has expressive aphasia - MRA of the brain showed occlusion of the left M2 division,  acute infarct on MRIs predominantly in the lateral lenticular striate territory.  -Continue full dose aspirin PR for CVA prophylaxis, failed swallow evaluation -Underwent TEE previous admission with no embolic source found; no indication to repeat echocardiogram this admission -Full stroke evaluation last admission so no indication to repeat carotid duplex, hemoglobin A1c or lipid panel this admission -Frequent neurological checks -PTOT evaluation pending but doubt patient will be able to participate in PT evaluations, very lethargic - Speech evaluation-> failed swallow evaluation, NPO -Repeat CT head showed evolving left MCA distribution subacute infarct - Overall poor prognosis, clearly aspirating with the diffuse rhonchorous lungs, has lost IV access, needs goals of care, family arriving today. Discussed with palliative care, GOC meeting today with family.   Acute encephalopathy with hypoxic respiratory failure, aspiration pneumonia, dysphagia, aspirating --Continue IV Zosyn, NPO  - will need goals of care for feeding, palliative medicine consult placed, GOC meeting today    Hypertension -Allow for permissive hypertension    Chronic combined systolic and diastolic CHF, NYHA class 1  -Euvolemic, hold diuretics -TEE previous admission revealed EF of 45-50% with grade 1 diastolic dysfunction -Discharge documentation states previous dry weight 170 pounds with discharge weight 163 pounds  Chronic obstructive pulmonary disease with chronic respiratory failure -Reported is O2 dependent on chronic 2 L oxygen - Placed on DuoNeb's    Hyperlipidemia -Holding Lipitor while NPO    Dysphagia Failed swallow evaluation   Code Status: dnr  DVT Prophylaxis:  Lovenox  Family Communication: son and daughter in law at bedside    Disposition Plan:  Pending palliative care input, possbilye shift to comfort measures in 1-2 days  Time Spent in minutes   25 minutes  Procedures:  MRI  brain MRA brain   Consultants:   Neurology  Antimicrobials:   IV Zosyn 12/24   Medications  Scheduled Meds: .  stroke: mapping our early stages of recovery book   Does not apply Once  . aspirin  300 mg Rectal Daily   Or  . aspirin  325 mg Oral Daily  . Chlorhexidine Gluconate Cloth  6 each Topical Q0600  . enoxaparin (LOVENOX) injection  40 mg Subcutaneous Q24H  . fluticasone furoate-vilanterol  1 puff Inhalation Daily  . ipratropium-albuterol  3 mL Nebulization QID  . mupirocin ointment  1 application Nasal BID  . nystatin   Topical TID  . piperacillin-tazobactam (ZOSYN)  IV  3.375 g Intravenous Q8H   Continuous Infusions: . dextrose 5 % and 0.45 % NaCl with KCl 20 mEq/L 50 mL/hr at 05/13/16 1212   PRN Meds:.acetaminophen **OR** acetaminophen (TYLENOL) oral liquid 160 mg/5 mL **OR** acetaminophen, glycopyrrolate, ipratropium-albuterol, senna-docusate   Antibiotics   Anti-infectives    Start     Dose/Rate Route Frequency Ordered Stop   05/10/16 0900  piperacillin-tazobactam (ZOSYN) IVPB 3.375 g     3.375 g 12.5 mL/hr over 240 Minutes Intravenous Every 8 hours 05/10/16 0859          Subjective:   Catherine Day was seen and examined today. She is sleeping, not waking up with sternal rub,  Per RN, patient does not have any purposeful movement, except trying to push away examiner with her left arm when being examed.   Family at bedside  Objective:   Vitals:   05/13/16 0749 05/13/16 0834 05/13/16 1127 05/13/16 1229  BP:  121/61  (!) 129/52  Pulse:      Resp:      Temp:  97.7 F (36.5 C)  (!) 96.7 F (35.9 C)  TempSrc:  Axillary  Axillary  SpO2: 98%  97%   Weight:      Height:        Intake/Output Summary (Last 24 hours) at 05/13/16 1326 Last data filed at 05/13/16 0600  Gross per 24 hour  Intake              980 ml  Output                0 ml  Net              980 ml     Wt Readings from Last 3 Encounters:  2016-05-27 74.8 kg (165 lb)  04/27/16 74.1  kg (163 lb 5.8 oz)  04/14/16 91.2 kg (201 lb)     Exam  General: not arousable   Neck: Supple, no JVD  Cardiovascular: S1 S2 clear, RRR  Respiratory: Diffuse rhonchi bilaterally , gurgling sounds   Gastrointestinal: Soft, nontender, nondistended, + bowel sounds  Ext: no cyanosis clubbing or edema  Neuro: not arousable   Skin: No rashes  Psych: not able to assess    Data Reviewed:  I have personally reviewed following labs and imaging  studies  Micro Results Recent Results (from the past 240 hour(s))  MRSA PCR Screening     Status: Abnormal   Collection Time: 05/10/16  3:51 PM  Result Value Ref Range Status   MRSA by PCR POSITIVE (A) NEGATIVE Final    Comment:        The GeneXpert MRSA Assay (FDA approved for NASAL specimens only), is one component of a comprehensive MRSA colonization surveillance program. It is not intended to diagnose MRSA infection nor to guide or monitor treatment for MRSA infections. RESULT CALLED TO, READ BACK BY AND VERIFIED WITH: Fayrene Fearing RN AT 1914 05/10/16 BY [facility]     Radiology Reports Dg Chest 2 View  Result Date: 06/07/2016 CLINICAL DATA:  82 year old female with fall.  History of COPD. EXAM: CHEST  2 VIEW COMPARISON:  Chest radiograph dated 06/25/2015 FINDINGS: There is shallow inspiration. Bibasilar streaky densities most compatible with atelectatic changes/ scarring. Infiltrate is less likely. No pleural effusion or pneumothorax. Stable cardiac silhouette. The aorta is tortuous. There is osteopenia with degenerative changes of the spine. No acute osseous pathology. IMPRESSION: Shallow inspiration with bibasilar atelectatic changes. Infiltrate is less likely. Electronically Signed   By: Elgie Collard M.D.   On: 05/26/2016 03:05   Ct Head Wo Contrast  Result Date: 05/10/2016 CLINICAL DATA:  Acute encephalopathy. Recent left MCA distribution infarct. EXAM: CT HEAD WITHOUT CONTRAST TECHNIQUE: Contiguous axial images were  obtained from the base of the skull through the vertex without intravenous contrast. COMPARISON:  MRI of May 22, 2016.  CT 05/31/2016. FINDINGS: Brain: Expected cerebral volume loss for age. Moderate low density in the periventricular white matter likely related to small vessel disease. Evolution of left MCA distribution infarct, slightly more well-defined today. Examples in the lateral left basal ganglia, subinsular region, and extending into the periventricular white matter of the left frontal lobe. No significant mass effect or midline shift. No complicating hemorrhage, hydrocephalus, intra-axial, or extra-axial fluid collection. Vascular: Intracranial carotid atherosclerosis. Skull: Normal Sinuses/Orbits: Normal orbits and globes. Minimal ethmoid air cell mucosal thickening. Chronic bilateral mastoid effusions are unchanged. Other: None IMPRESSION: 1. Evolving left MCA distribution subacute infarct. 2. Chronic mastoid fluid bilaterally. Electronically Signed   By: Jeronimo Greaves M.D.   On: 05/10/2016 17:56   Ct Head Wo Contrast  Result Date: 05/30/2016 CLINICAL DATA:  82 year old female with fall and laceration to back of the head. EXAM: CT HEAD WITHOUT CONTRAST CT CERVICAL SPINE WITHOUT CONTRAST TECHNIQUE: Multidetector CT imaging of the head and cervical spine was performed following the standard protocol without intravenous contrast. Multiplanar CT image reconstructions of the cervical spine were also generated. COMPARISON:  Brain MRI dated 04/26/2016 and head CT dated 04/09/2016 FINDINGS: CT HEAD FINDINGS Brain: There is moderate age-related atrophy and chronic microvascular ischemic changes. Focal area of hypodensity in the left corona radiata extending from the left lateral ventricle inferiorly (series 201 images 16-20) appears new since the prior CT and may represent stop a subacute and less likely an acute infarct. Associated mild edema. Correlation with clinical exam recommended. MRI is recommended if  there is clinical concern for an acute infarct. There is no acute intracranial hemorrhage. No mass effect or midline shift noted. Vascular: No hyperdense vessel or unexpected calcification. Skull: Normal. Negative for fracture or focal lesion. Sinuses/Orbits: There is partial opacification of multiple ethmoid air cells. No air-fluid levels. There is opacification of the mastoid air cells bilaterally. Other: Small right forehead scalp contusion. CT CERVICAL SPINE FINDINGS Alignment: Normal. Skull base and  vertebrae: No acute fracture. No primary bone lesion or focal pathologic process. Soft tissues and spinal canal: No prevertebral fluid or swelling. No visible canal hematoma. Disc levels: Multilevel degenerative changes with mild facet hypertrophy. Upper chest: Negative. Other: None IMPRESSION: No acute intracranial hemorrhage. Focal area of hypodensity extending from the lateral aspect of the left lateral ventricle inferiorly appears new since the prior CT and may represent a subacute or less likely an acute infarct. Correlation with clinical exam recommended. MRI is recommended if there is clinical concern for acute infarct. Moderate age-related atrophy and chronic microvascular ischemic changes. No acute/ traumatic cervical spine pathology. Electronically Signed   By: Elgie CollardArash  Radparvar M.D.   On: 05/04/2016 03:38   Ct Cervical Spine Wo Contrast  Result Date: 04/28/2016 CLINICAL DATA:  82 year old female with fall and laceration to back of the head. EXAM: CT HEAD WITHOUT CONTRAST CT CERVICAL SPINE WITHOUT CONTRAST TECHNIQUE: Multidetector CT imaging of the head and cervical spine was performed following the standard protocol without intravenous contrast. Multiplanar CT image reconstructions of the cervical spine were also generated. COMPARISON:  Brain MRI dated 04/26/2016 and head CT dated 04/09/2016 FINDINGS: CT HEAD FINDINGS Brain: There is moderate age-related atrophy and chronic microvascular ischemic  changes. Focal area of hypodensity in the left corona radiata extending from the left lateral ventricle inferiorly (series 201 images 16-20) appears new since the prior CT and may represent stop a subacute and less likely an acute infarct. Associated mild edema. Correlation with clinical exam recommended. MRI is recommended if there is clinical concern for an acute infarct. There is no acute intracranial hemorrhage. No mass effect or midline shift noted. Vascular: No hyperdense vessel or unexpected calcification. Skull: Normal. Negative for fracture or focal lesion. Sinuses/Orbits: There is partial opacification of multiple ethmoid air cells. No air-fluid levels. There is opacification of the mastoid air cells bilaterally. Other: Small right forehead scalp contusion. CT CERVICAL SPINE FINDINGS Alignment: Normal. Skull base and vertebrae: No acute fracture. No primary bone lesion or focal pathologic process. Soft tissues and spinal canal: No prevertebral fluid or swelling. No visible canal hematoma. Disc levels: Multilevel degenerative changes with mild facet hypertrophy. Upper chest: Negative. Other: None IMPRESSION: No acute intracranial hemorrhage. Focal area of hypodensity extending from the lateral aspect of the left lateral ventricle inferiorly appears new since the prior CT and may represent a subacute or less likely an acute infarct. Correlation with clinical exam recommended. MRI is recommended if there is clinical concern for acute infarct. Moderate age-related atrophy and chronic microvascular ischemic changes. No acute/ traumatic cervical spine pathology. Electronically Signed   By: Elgie CollardArash  Radparvar M.D.   On: 04/21/2016 03:38   Mr Maxine GlennMra Head Wo Contrast  Result Date: 05/10/2016 CLINICAL DATA:  Acute left MCA infarct. EXAM: MRA NECK WITHOUT AND WITH CONTRAST MRA HEAD WITHOUT CONTRAST TECHNIQUE: Multiplanar and multiecho pulse sequences of the neck were obtained without and with intravenous contrast.  Angiographic images of the neck were obtained using MRA technique without and with intravenous contast.; Angiographic images of the Circle of Willis were obtained using MRA technique without intravenous contrast. CONTRAST:  14mL MULTIHANCE GADOBENATE DIMEGLUMINE 529 MG/ML IV SOLN COMPARISON:  None. FINDINGS: MRA NECK FINDINGS The study is moderately motion degraded mainly in the lower neck/ upper chest. Presumed standard 3 vessel aortic arch branching, though the origin of the left vertebral artery is not established on this study due to artifact. The brachiocephalic and subclavian arteries are grossly patent. However, motion artifact completely obscures  the region of the distal brachiocephalic artery and proximal right common carotid artery. Artifact through this level also obscures a portion of the mid left common carotid artery as well as both proximal V1 segments. The portions of the common carotid arteries which are well-visualized are widely patent. The internal carotid arteries are patent bilaterally without evidence of significant stenosis. The distal right cervical ICA is tortuous. The vertebral arteries are patent and codominant. There is no evidence of flow limiting stenosis in the V2 or V3 segments. There are areas of mild-to-moderate narrowing versus artifact in the V3 segments. The V1 segments are not well evaluated. MRA HEAD FINDINGS The study is mildly motion degraded. The visualized distal vertebral arteries are patent to the basilar and codominant. Right PICA origin is patent. Left PICA is not identified, nor are AICAs. Basilar artery is widely patent. SCA origins are patent. There is a fetal type origin of the left PCA with absent left P1. The PCAs are patent without evidence of significant proximal stenosis. Internal carotid arteries are patent from skullbase to carotid termini without evidence of significant stenosis. ACAs and right MCA are patent without evidence of major branch occlusion or  flow limiting proximal stenosis. The left M1 segment is patent with at most mild narrowing distally. There is occlusion of the left M2 inferior division at its origin. There is mild reconstitution of flow and some more distal branches. Flow is present in the left M2 superior division, though it has an attenuated appearance. No intracranial aneurysm is identified. IMPRESSION: 1. Occlusion of the left M2 inferior division. Note that the acute infarct on yesterday's MRI is predominantly in the lateral lenticulostriate territory however. 2. Motion degraded neck MRA with limited assessment of the large vessels in the upper chest and lower neck. No evidence of flow limiting cervical carotid or vertebral artery stenosis within this limitation. Electronically Signed   By: Sebastian Ache M.D.   On: 05/10/2016 08:08   Mr Maxine Glenn Neck W Wo Contrast  Result Date: 05/10/2016 CLINICAL DATA:  Acute left MCA infarct. EXAM: MRA NECK WITHOUT AND WITH CONTRAST MRA HEAD WITHOUT CONTRAST TECHNIQUE: Multiplanar and multiecho pulse sequences of the neck were obtained without and with intravenous contrast. Angiographic images of the neck were obtained using MRA technique without and with intravenous contast.; Angiographic images of the Circle of Willis were obtained using MRA technique without intravenous contrast. CONTRAST:  14mL MULTIHANCE GADOBENATE DIMEGLUMINE 529 MG/ML IV SOLN COMPARISON:  None. FINDINGS: MRA NECK FINDINGS The study is moderately motion degraded mainly in the lower neck/ upper chest. Presumed standard 3 vessel aortic arch branching, though the origin of the left vertebral artery is not established on this study due to artifact. The brachiocephalic and subclavian arteries are grossly patent. However, motion artifact completely obscures the region of the distal brachiocephalic artery and proximal right common carotid artery. Artifact through this level also obscures a portion of the mid left common carotid artery as well  as both proximal V1 segments. The portions of the common carotid arteries which are well-visualized are widely patent. The internal carotid arteries are patent bilaterally without evidence of significant stenosis. The distal right cervical ICA is tortuous. The vertebral arteries are patent and codominant. There is no evidence of flow limiting stenosis in the V2 or V3 segments. There are areas of mild-to-moderate narrowing versus artifact in the V3 segments. The V1 segments are not well evaluated. MRA HEAD FINDINGS The study is mildly motion degraded. The visualized distal vertebral arteries are  patent to the basilar and codominant. Right PICA origin is patent. Left PICA is not identified, nor are AICAs. Basilar artery is widely patent. SCA origins are patent. There is a fetal type origin of the left PCA with absent left P1. The PCAs are patent without evidence of significant proximal stenosis. Internal carotid arteries are patent from skullbase to carotid termini without evidence of significant stenosis. ACAs and right MCA are patent without evidence of major branch occlusion or flow limiting proximal stenosis. The left M1 segment is patent with at most mild narrowing distally. There is occlusion of the left M2 inferior division at its origin. There is mild reconstitution of flow and some more distal branches. Flow is present in the left M2 superior division, though it has an attenuated appearance. No intracranial aneurysm is identified. IMPRESSION: 1. Occlusion of the left M2 inferior division. Note that the acute infarct on yesterday's MRI is predominantly in the lateral lenticulostriate territory however. 2. Motion degraded neck MRA with limited assessment of the large vessels in the upper chest and lower neck. No evidence of flow limiting cervical carotid or vertebral artery stenosis within this limitation. Electronically Signed   By: Sebastian AcheAllen  Grady M.D.   On: 05/10/2016 08:08   Mr Brain Wo Contrast  Result  Date: 2016/03/19 CLINICAL DATA:  Fall with right-sided weakness. EXAM: MRI HEAD WITHOUT CONTRAST TECHNIQUE: Multiplanar, multiecho pulse sequences of the brain and surrounding structures were obtained without intravenous contrast. COMPARISON:  Head CT 2016/03/19 Brain MR 04/26/2016 FINDINGS: Brain: There are areas of diffusion restriction within the left caudate head, extending into the putamen; the left frontal operculum; and of the subinsular white matter. The abnormality is in close proximity to the posterior limb of the internal capsule. There is also a possible focus of diffusion restriction within the left cerebral peduncle. No acute hemorrhage. There is beginning confluent hyperintense T2-weighted signal within the periventricular white matter, most often seen in the setting of chronic microvascular ischemia. No mass lesion or midline shift. No hydrocephalus or extra-axial fluid collection. The midline structures are normal. No age advanced or lobar predominant atrophy. Vascular: Major intracranial arterial and venous sinus flow voids are preserved. No evidence of chronic microhemorrhage or amyloid angiopathy. Skull and upper cervical spine: The visualized skull base, calvarium, upper cervical spine and extracranial soft tissues are normal. Sinuses/Orbits: Bilateral mastoid effusions. Paranasal sinuses are clear. Normal orbits. IMPRESSION: 1. Acute infarct within the left subinsular white matter, left frontal operculum and left basal ganglia (predominantly the caudate head and putamen). No hemorrhage, midline shift or mass effect. 2. Punctate focus of possible ischemia within the left cerebral peduncle. 3. Chronic microvascular disease. 4. Bilateral mastoid effusions. Electronically Signed   By: Deatra RobinsonKevin  Herman M.D.   On: 2016/03/19 05:39   Mr Brain Wo Contrast  Result Date: 04/26/2016 CLINICAL DATA:  Worsening hearing loss over the past 4-5 days. Stroke last month. EXAM: MRI HEAD WITHOUT CONTRAST  TECHNIQUE: Multiplanar, multiecho pulse sequences of the brain and surrounding structures were obtained without intravenous contrast. COMPARISON:  04/10/2016 FINDINGS: Brain: Small volume blood products are again seen in the mesial left temporal lobe extending to the region of inferior basal ganglia/ genu of the left internal capsule. Minimal residual diffusion signal abnormality is present in these locations related to the blood products. Abnormal trace diffusion signal elsewhere in the left greater than right cerebral hemispheres and left cerebellum on the prior study has resolved. There is minimal T2/FLAIR hyperintensity in the mesial left temporal lobe/ basal  ganglia region and left temporal occipital region corresponding to the previously demonstrated infarcts. There is no evidence of acute infarct, new intracranial hemorrhage, mass, midline shift, or extra-axial fluid collection. Bilateral periventricular white matter T2 hyperintensities are unchanged and nonspecific but compatible with mild chronic small vessel ischemic disease. There is moderate cerebral atrophy. Vascular: Major intracranial vascular flow voids are preserved. Skull and upper cervical spine: Unremarkable bone marrow signal. Sinuses/Orbits: Unremarkable orbits. Minimal posterior right ethmoid air cell mucosal thickening. Large bilateral mastoid effusions, increased from prior. Fluid is now present in the middle ear cavities bilaterally. Other: None. IMPRESSION: 1. Residual small volume blood products in the mesial left temporal lobe and left internal capsule/inferior basal ganglia region associated with subacute infarcts demonstrated on the prior MRI. 2. Resolved diffusion abnormality associated with the small infarcts elsewhere. 3. No acute infarct or new intracranial abnormality. 4. Large bilateral mastoid and middle ear effusions. Electronically Signed   By: Sebastian Ache M.D.   On: 04/26/2016 19:03   Dg Chest Port 1 View  Result Date:  05-Jun-2016 CLINICAL DATA:  Shortness of Breath following recent fall EXAM: PORTABLE CHEST 1 VIEW COMPARISON:  06/05/16 FINDINGS: Cardiac shadow is stable. Bibasilar atelectatic changes are noted slightly more prominent than that seen on the prior exam. No pneumothorax or sizable effusion is seen. No bony abnormality is noted. IMPRESSION: Mild bibasilar atelectasis. Electronically Signed   By: Alcide Clever M.D.   On: 05-Jun-2016 21:56   Dg Chest Port 1 View  Result Date: 04/23/2016 CLINICAL DATA:  Aspiration pneumonia EXAM: PORTABLE CHEST 1 VIEW COMPARISON:  04/23/2016 at 12:20 FINDINGS: Mild streaky opacity in the right days. Left lung is clear. Pulmonary vasculature is normal. No effusions. Hilar, mediastinal and cardiac contours are unremarkable and unchanged. IMPRESSION: Streaky right base opacity. No focal confluent consolidation. No significant change in the interim. Electronically Signed   By: Ellery Plunk M.D.   On: 04/23/2016 23:41   Dg Chest Port 1 View  Result Date: 04/23/2016 CLINICAL DATA:  COPD, aspiration pneumonia. EXAM: PORTABLE CHEST 1 VIEW COMPARISON:  Portable chest x-ray of Sep 20, 2015 FINDINGS: The lungs are mildly hyperinflated. The interstitial markings are coarse. Increased density at the right lung base is consistent with the clinically suspected aspiration pneumonia. The heart and pulmonary vascularity are normal. There is calcification in the wall of the tortuous thoracic aorta. There is no pleural effusion. The bony thorax exhibits no acute abnormality. IMPRESSION: Right basilar pneumonia consistent with aspiration. COPD. Thoracic aortic atherosclerosis. When the patient can tolerate the procedure, a PA and lateral chest x-ray would be useful. Electronically Signed   By: David  [place] M.D.   On: 04/23/2016 12:34   Dg Chest Port 1 View  Result Date: 04/21/2016 CLINICAL DATA:  Initial evaluation for acute respiratory distress, wheezing. History of COPD. EXAM: PORTABLE  CHEST 1 VIEW COMPARISON:  Prior radiograph from 04/21/2016. FINDINGS: Cardiac and mediastinal silhouettes are stable in size and contour, and remain within normal limits. Atherosclerotic disease noted within the aortic arch. Lungs are hypoinflated. Changes related COPD noted. Increased right basilar opacity favored to reflect atelectasis/bronchovascular crowding, although recurrent infiltrate not excluded. Left basilar atelectasis noted. Small left pleural effusion. No pulmonary edema or pneumothorax. No acute osseous abnormality. IMPRESSION: 1. Shallow lung inflation with increased right basilar opacity, favored to reflect atelectasis/ bronchovascular crowding, although recurrent infiltrate could be considered in the correct clinical setting. 2. Small left pleural effusion with associated atelectasis. 3. COPD. Electronically Signed   By: Sharlet Salina  Phill Myron M.D.   On: 04/21/2016 20:38   Dg Chest Port 1 View  Result Date: 04/21/2016 CLINICAL DATA:  Follow-up wheezing, COPD. EXAM: PORTABLE CHEST 1 VIEW COMPARISON:  04/20/2016 FINDINGS: There is hyperinflation of the lungs compatible with COPD. Heart and mediastinal contours are within normal limits. No focal opacities or effusions. No acute bony abnormality. IMPRESSION: COPD.  No active disease. Electronically Signed   By: Charlett Nose M.D.   On: 04/21/2016 08:27   Dg Chest Port 1 View  Result Date: 04/20/2016 CLINICAL DATA:  Pneumonia. EXAM: PORTABLE CHEST 1 VIEW COMPARISON:  04/16/2016. FINDINGS: Mediastinum and hilar structures are normal. Heart size stable. Low lung volumes with basilar atelectasis. Slight improvement of right base infiltrate. A component of right base bronchiectasis cannot be excluded. Tiny left pleural effusion. No pneumothorax. IMPRESSION: 1. Interim partial clearing of right base infiltrate. A component of bronchiectasis in the right base cannot be excluded. 2. Low lung volumes.  Small left pleural effusion noted. Electronically  Signed   By: Maisie Fus  Register   On: 04/20/2016 07:20   Dg Chest Port 1 View  Result Date: 04/16/2016 CLINICAL DATA:  Acute onset of shortness of breath. Initial encounter. EXAM: PORTABLE CHEST 1 VIEW COMPARISON:  Chest radiograph performed 04/14/2016 FINDINGS: Persistent right basilar airspace opacity remains concerning for pneumonia. No definite pleural effusion or pneumothorax is seen. Mild vascular congestion is noted. The cardiomediastinal silhouette is mildly enlarged. No acute osseous abnormalities are identified. IMPRESSION: 1. Persistent right basilar airspace opacity remains concerning for pneumonia. 2. Mild vascular congestion and mild cardiomegaly. Electronically Signed   By: Roanna Raider M.D.   On: 04/16/2016 06:26   Dg Chest Port 1 View  Result Date: 04/14/2016 CLINICAL DATA:  Acute onset of shortness of breath and cough. Initial encounter. EXAM: PORTABLE CHEST 1 VIEW COMPARISON:  Chest radiograph performed 04/11/2016 FINDINGS: Right basilar airspace opacity raises concern for pneumonia. No pleural effusion or pneumothorax is seen. The cardiomediastinal silhouette is borderline normal in size. No acute osseous abnormalities are identified. IMPRESSION: Right basilar pneumonia noted. Electronically Signed   By: Roanna Raider M.D.   On: 04/14/2016 21:50   Dg Chest Port 1v Same Day  Result Date: 05/10/2016 CLINICAL DATA:  Dyspnea, history COPD, stroke EXAM: PORTABLE CHEST 1 VIEW COMPARISON:  Portable exam 0932 hours compared to 05/08/2016 FINDINGS: Upper normal heart size. Rotated to the RIGHT. Mediastinal contours and pulmonary vascularity normal. Atherosclerotic calcification aorta. Bronchitic changes with bibasilar atelectasis. Remaining lungs clear. No pleural effusion or pneumothorax. IMPRESSION: Bronchitic changes with bibasilar atelectasis. Aortic atherosclerosis. Electronically Signed   By: Ulyses Southward M.D.   On: 05/10/2016 09:53   Dg Knee Complete 4 Views Right  Result Date:  04/27/2016 CLINICAL DATA:  82 year old female with fall and right knee swelling. EXAM: RIGHT KNEE - COMPLETE 4+ VIEW COMPARISON:  None. FINDINGS: No acute fracture or dislocation. The bones are osteopenic. There is meniscal chondrocalcinosis with mild osteoarthritic changes of the knees. No joint effusion. The soft tissues appear unremarkable. IMPRESSION: No acute fracture or dislocation. Electronically Signed   By: Elgie Collard M.D.   On: 04/18/2016 03:04   Dg Swallowing Func-speech Pathology  Result Date: 04/17/2016 Objective Swallowing Evaluation: Type of Study: MBS-Modified Barium Swallow Study Patient Details Name: Catherine Day MRN: [id number] Date of Birth: 10-15-1927 Today's Date: 04/17/2016 Time: SLP Start Time (ACUTE ONLY): 1045-SLP Stop Time (ACUTE ONLY): 1115 SLP Time Calculation (min) (ACUTE ONLY): 30 min Past Medical History: Past Medical History: Diagnosis Date . COPD (  chronic obstructive pulmonary disease) (HCC)  Past Surgical History: Past Surgical History: Procedure Laterality Date . IR GENERIC HISTORICAL  04/09/2016  IR ANGIO INTRA EXTRACRAN SEL COM CAROTID INNOMINATE UNI R MOD SED 04/09/2016 Julieanne Cotton, MD [facility]INTER[facility] RAD . IR GENERIC HISTORICAL  04/09/2016  IR PERCUTANEOUS ART THROMBECTOMY/INFUSION INTRACRANIAL INC DIAG ANGIO 04/09/2016 Julieanne Cotton, MD [facility] . PELVIC FRACTURE SURGERY   . RADIOLOGY WITH ANESTHESIA N/A 04/09/2016  Procedure: RADIOLOGY WITH ANESTHESIA;  Surgeon: Julieanne Cotton, MD;  Location: [facility] OR;  Service: Radiology;  Laterality: N/A; . TEE WITHOUT CARDIOVERSION N/A 04/13/2016  Procedure: TRANSESOPHAGEAL ECHOCARDIOGRAM (TEE);  Surgeon: Thurmon Fair, MD;  Location: [facility] ENDOSCOPY;  Service: Cardiovascular;  Laterality: N/A; HPI: 82 y.o. right handed female with h/o COPD. Presented 04/10/2016 with sudden onset of right-sided weakness and inability to speak. CT/MRI showed small volume of scattered acute infarct in the left hemisphere primarily in the left  temporal and occipital lobes with petechial hemorrhage in the left temporal lobe. Pt discharged to CIR 11/28, however, later that night she became dyspneic and diaphoretic. She was given albuterol nebs with some improvement. CXR with RLL infiltrate. Subjective: pt awake, pleasant Assessment / Plan / Recommendation CHL IP CLINICAL IMPRESSIONS 04/17/2016 Therapy Diagnosis Mild oral phase dysphagia;Mild pharyngeal phase dysphagia Clinical Impression Pt's swallow function demonstrates mild improvements since [facility] 11/26.  She presents with piecemeal oral bolusing; persisting delay in initiation (valleculae and pyriforms) -however, this is not necessarily considered disordered in the elderly population.  There may have been questionable, high penetration of liquids, but it was difficult to discern.  No aspiration. Pyriform sinuses appear to be asymmetric and there is mild residue remaining post-swallow. Recommend advancing diet to dysphagia 3, nectar-thick liquids for now.  Will follow for functional toleration/safety.   Impact on safety and function Mild aspiration risk   CHL IP TREATMENT RECOMMENDATION 04/17/2016 Treatment Recommendations Therapy as outlined in treatment plan below   Prognosis 04/17/2016 Prognosis for Safe Diet Advancement Good Barriers to Reach Goals -- Barriers/Prognosis Comment -- CHL IP DIET RECOMMENDATION 04/17/2016 SLP Diet Recommendations Dysphagia 3 (Mech soft) solids;Nectar thick liquid Liquid Administration via Cup Medication Administration Whole meds with puree Compensations Small sips/bites;Slow rate Postural Changes --   CHL IP OTHER RECOMMENDATIONS 04/17/2016 Recommended Consults -- Oral Care Recommendations Oral care BID Other Recommendations --   CHL IP FOLLOW UP RECOMMENDATIONS 04/17/2016 Follow up Recommendations Inpatient Rehab   CHL IP FREQUENCY AND DURATION 04/17/2016 Speech Therapy Frequency (ACUTE ONLY) min 2x/week Treatment Duration 2 weeks      CHL IP ORAL PHASE 04/17/2016 Oral Phase  Impaired Oral - Pudding Teaspoon -- Oral - Pudding Cup -- Oral - Honey Teaspoon -- Oral - Honey Cup -- Oral - Nectar Teaspoon -- Oral - Nectar Cup -- Oral - Nectar Straw -- Oral - Thin Teaspoon -- Oral - Thin Cup -- Oral - Thin Straw -- Oral - Puree Piecemeal swallowing Oral - Mech Soft Piecemeal swallowing Oral - Regular -- Oral - Multi-Consistency -- Oral - Pill -- Oral Phase - Comment --  CHL IP PHARYNGEAL PHASE 04/17/2016 Pharyngeal Phase Impaired Pharyngeal- Pudding Teaspoon -- Pharyngeal -- Pharyngeal- Pudding Cup -- Pharyngeal -- Pharyngeal- Honey Teaspoon -- Pharyngeal -- Pharyngeal- Honey Cup NT Pharyngeal -- Pharyngeal- Nectar Teaspoon -- Pharyngeal -- Pharyngeal- Nectar Cup Pharyngeal residue - pyriform;Delayed swallow initiation-pyriform sinuses Pharyngeal Material enters airway, remains ABOVE vocal cords then ejected out Pharyngeal- Nectar Straw -- Pharyngeal -- Pharyngeal- Thin Teaspoon -- Pharyngeal -- Pharyngeal- Thin Cup Delayed swallow initiation-pyriform sinuses;Pharyngeal  residue - pyriform Pharyngeal Material enters airway, remains ABOVE vocal cords then ejected out Pharyngeal- Thin Straw -- Pharyngeal -- Pharyngeal- Puree Delayed swallow initiation-vallecula;Pharyngeal residue - pyriform Pharyngeal -- Pharyngeal- Mechanical Soft Delayed swallow initiation-vallecula;Pharyngeal residue - pyriform Pharyngeal -- Pharyngeal- Regular -- Pharyngeal -- Pharyngeal- Multi-consistency -- Pharyngeal -- Pharyngeal- Pill -- Pharyngeal -- Pharyngeal Comment --  CHL IP CERVICAL ESOPHAGEAL PHASE 04/12/2016 Cervical Esophageal Phase WFL Pudding Teaspoon -- Pudding Cup -- Honey Teaspoon -- Honey Cup -- Nectar Teaspoon -- Nectar Cup -- Nectar Straw -- Thin Teaspoon -- Thin Cup -- Thin Straw -- Puree -- Mechanical Soft -- Regular -- Multi-consistency -- Pill -- Cervical Esophageal Comment -- No flowsheet data found. Blenda Mounts Laurice 04/17/2016, 11:50 AM               Lab Data:  CBC:  Recent Labs Lab  04/17/2016 0307 05/10/16 0853 05/11/16 0202 05/12/16 0255 05/13/16 0334  WBC 11.8* 11.6* 11.8* 12.9* 10.9*  NEUTROABS 9.4*  --   --   --   --   HGB 11.7* 11.7* 11.4* 11.7* 10.9*  HCT 36.2 36.6 35.8* 36.5 34.9*  MCV 86.6 85.9 86.1 86.3 87.0  PLT 196 220 218 215 217   Basic Metabolic Panel:  Recent Labs Lab 04/29/2016 0307 05/17/2016 0801 05/10/16 0853 05/11/16 0202 05/12/16 0255 05/13/16 0334  NA 136  --  138 139 139 142  K 3.5  --  3.8 3.4* 3.3* 3.6  CL 98*  --  99* 99* 103 105  CO2 29  --  29 29 28 25   GLUCOSE 127*  --  108* 109* 129* 148*  BUN 15  --  16 18 14 17   CREATININE 0.83  --  0.81 0.85 0.75 0.70  CALCIUM 8.0*  --  7.8* 7.5* 7.4* 7.1*  MG  --  1.8  --   --   --   --   PHOS  --  2.8  --   --   --   --    GFR: Estimated Creatinine Clearance: 47.8 mL/min (by C-G formula based on SCr of 0.7 mg/dL). Liver Function Tests:  Recent Labs Lab 05/03/2016 0307  AST 26  ALT 24  ALKPHOS 72  BILITOT 1.1  PROT 6.9  ALBUMIN 3.0*   No results for input(s): LIPASE, AMYLASE in the last 168 hours.  Recent Labs Lab 05/10/16 0853  AMMONIA 15   Coagulation Profile:  Recent Labs Lab 04/28/2016 0801  INR 1.10   Cardiac Enzymes:  Recent Labs Lab 05/17/2016 0307  TROPONINI <0.03   BNP (last 3 results) No results for input(s): PROBNP in the last 8760 hours. HbA1C: No results for input(s): HGBA1C in the last 72 hours. CBG:  Recent Labs Lab 05/10/16 1722  GLUCAP 83   Lipid Profile:  Recent Labs  05/11/16 0202  CHOL 147  HDL 42  LDLCALC 86  TRIG 95  CHOLHDL 3.5   Thyroid Function Tests: No results for input(s): TSH, T4TOTAL, FREET4, T3FREE, THYROIDAB in the last 72 hours. Anemia Panel: No results for input(s): VITAMINB12, FOLATE, FERRITIN, TIBC, IRON, RETICCTPCT in the last 72 hours. Urine analysis:    Component Value Date/Time   COLORURINE YELLOW 05/05/2016 0419   APPEARANCEUR HAZY (A) 04/25/2016 0419   LABSPEC 1.020 05/03/2016 0419   PHURINE 5.0  04/24/2016 0419   GLUCOSEU NEGATIVE 04/20/2016 0419   HGBUR NEGATIVE 05/14/2016 0419   BILIRUBINUR NEGATIVE 05/08/2016 0419   KETONESUR NEGATIVE 04/24/2016 0419   PROTEINUR NEGATIVE 05/12/2016 0419  NITRITE NEGATIVE 06-01-16 0419   LEUKOCYTESUR NEGATIVE 06/01/2016 0419     Orest Dygert M.D. PhD [facility] 05/13/2016, 1:26 PM   Between 7am to 7pm - call Pager - [phone]  After 7pm go to [email or web address] - password [email or web address]  Call night coverage person covering after 7pm

## 2016-05-13 NOTE — Progress Notes (Signed)
Pharmacy Antibiotic Note  Hughie ClossRegina Doby is a 82 y.o. female admitted on 05/08/2016 for fall (recurrent stroke and worsened R side deficits) with aspiration pneumonia.  Pharmacy has been consulted for zosyn dosing. -Afebrile, WBC down to 10.9 -SCr stable  Plan: Zosyn 3.375g IV q8h (4 hour infusion).  Monitor clinical picture, Tmax, CBC, LOT  Height: 5\' 2"  (157.5 cm) Weight: 165 lb (74.8 kg) IBW/kg (Calculated) : 50.1  Temp (24hrs), Avg:98 F (36.7 C), Min:97.4 F (36.3 C), Max:98.7 F (37.1 C)   Recent Labs Lab 05/12/2016 0307 05/10/16 0853 05/11/16 0202 05/12/16 0255 05/13/16 0334  WBC 11.8* 11.6* 11.8* 12.9* 10.9*  CREATININE 0.83 0.81 0.85 0.75 0.70    Estimated Creatinine Clearance: 47.8 mL/min (by C-G formula based on SCr of 0.7 mg/dL).    No Known Allergies  Antimicrobials this admission: Zosyn 12/24  >>   Dose adjustments this admission: n/a  Microbiology results: 12/24 MRSA PCR: positive  Thank you for allowing pharmacy to be a part of this patient's care.  Gwyndolyn KaufmanKai Jasamine Pottinger Bernette Redbird(Kenny), PharmD  PGY1 Pharmacy Resident Pager: [phone] 05/13/2016 9:12 AM

## 2016-05-14 ENCOUNTER — Inpatient Hospital Stay (HOSPITAL_COMMUNITY): Payer: Medicare Other

## 2016-05-14 NOTE — Progress Notes (Signed)
STROKE TEAM PROGRESS NOTE   HISTORY OF PRESENT ILLNESS (per record) Catherine Day is a 82 y.o. female with a history of recent left carotid stenting and embolectomy following left MCA stroke. She did well following the stroke, and went to a nursing home without aphasia. Yesterday, she fell around 11:30 PM 05/08/16 (LKW) and was noted to be aphasic. Last seen well was earlier in the evening, but no clear time was documented in the notes. An MRI obtained showed infarcts in subcortical left hemisphere. tPA was not given due to unclear time of onset.   SUBJECTIVE (INTERVAL HISTORY) Palliative care met with family yesterday - no clear decision about feeding, future care. Patient more lethargic this am. Concerns related to aspiration continue. But patient appears more alert and interactive today.  OBJECTIVE Temp:  [96.5 F (35.8 C)-98.1 F (36.7 C)] 97.5 F (36.4 C) (12/28 0700) Pulse Rate:  [65-72] 71 (12/28 0400) Cardiac Rhythm: Normal sinus rhythm;Bundle branch block (12/28 0701) Resp:  [18-26] 24 (12/28 0400) BP: (113-132)/(35-71) 128/71 (12/28 0400) SpO2:  [94 %-99 %] 98 % (12/28 0820) FiO2 (%):  [2 %] 2 % (12/28 0400)  CBC:   Recent Labs Lab 04/17/2016 0307  05/12/16 0255 05/13/16 0334  WBC 11.8*  < > 12.9* 10.9*  NEUTROABS 9.4*  --   --   --   HGB 11.7*  < > 11.7* 10.9*  HCT 36.2  < > 36.5 34.9*  MCV 86.6  < > 86.3 87.0  PLT 196  < > 215 217  < > = values in this interval not displayed.  Basic Metabolic Panel:   Recent Labs Lab 04/17/2016 0801  05/12/16 0255 05/13/16 0334  NA  --   < > 139 142  K  --   < > 3.3* 3.6  CL  --   < > 103 105  CO2  --   < > 28 25  GLUCOSE  --   < > 129* 148*  BUN  --   < > 14 17  CREATININE  --   < > 0.75 0.70  CALCIUM  --   < > 7.4* 7.1*  MG 1.8  --   --   --   PHOS 2.8  --   --   --   < > = values in this interval not displayed.  Lipid Panel:     Component Value Date/Time   CHOL 147 05/11/2016 0202   TRIG 95 05/11/2016 0202   HDL 42  05/11/2016 0202   CHOLHDL 3.5 05/11/2016 0202   VLDL 19 05/11/2016 0202   LDLCALC 86 05/11/2016 0202   HgbA1c:  Lab Results  Component Value Date   HGBA1C 5.8 (H) 04/10/2016    IMAGING  Ct Head Wo Contrast 05/10/2016 1. Evolving left MCA distribution subacute infarct. 2. Chronic mastoid fluid bilaterally.  05/01/2016 No acute intracranial hemorrhage.  Focal area of hypodensity extending from the lateral aspect of the left lateral ventricle inferiorly appears new since the prior CT and may represent a subacute or less likely an acute infarct.  Correlation with clinical exam recommended. MRI is recommended if there is clinical concern for acute infarct.  Moderate age-related atrophy and chronic microvascular ischemic changes.  No acute/ traumatic cervical spine pathology.   Ct Cervical Spine Wo Contrast 05/01/2016 No acute/ traumatic cervical spine pathology.  Mri / Mra Head and Neck Wo Contrast 05/10/2016 1. Occlusion of the left M2 inferior division.  Note that the acute infarct on yesterday's MRI  is predominantly in the lateral lenticulostriate territory however.  2. Motion degraded neck MRA with limited assessment of the large vessels in the upper chest and lower neck.  No evidence of flow limiting cervical carotid or vertebral artery stenosis within this limitation.   Mri Brain Wo Contrast 04/22/2016 1. Acute infarct within the left subinsular white matter, left frontal operculum and left basal ganglia (predominantly the caudate head and putamen). No hemorrhage, midline shift or mass effect.  2. Punctate focus of possible ischemia within the left cerebral peduncle.  3. Chronic microvascular disease.  4. Bilateral mastoid effusions.   EEG is abnormal due to the presence of moderate diffuse slowing of the background with additional focal slowing over the left hemisphere  PHYSICAL EXAM frail elderly lady not in distress.Respirations slightly labored with upper airway  secretions and bilateral rhonchi on auscultation CV: RRR, no MRG. No Carotid Bruits. No peripheral edema, warm, nontender Eyes: Conjunctivae clear without exudates or hemorrhage Mental Status: Patient is awake and interactive speech is rambling and difficult to understand,   Follows simple midline commands. Cranial Nerves:    The pupils are equal, round, and reactive to light. Does not blink to threat. Conjugate gaze.  Right lower facial droop. +oculocephalic reflex, +corneals.  Tone:    Decreased right arm and right leg Motor:    Dense right hemiparesis, moves left side spontaneously Sensation: Does not w/d to stim on the right      ASSESSMENT/PLAN Catherine Day is a 82 y.o. female with history of COPD, recent left middle cerebral artery stroke with left carotid stenting and embolectomy in November 2017, presenting with a recent fall and aphasia.  She did not receive IV t-PA due to unknown time of onset.  Stroke:  Dominant L MCA infarct embolic - source unknown  Resultant  Dense right hemiparesis, dysphagia  MRI - acute infarct within the left subinsular white matter, left frontal operculum and left basal ganglia  MRA head and neck - occlusion of the left M2 inferior division.   Repeat CT head evolving L MCA infarct. No new abnormality  2D Echo - TEE 04/13/2016 - EF 45 - 50%. No cardiac source of emboli identified.   LDL - 04/10/2016 - 86  HgbA1c - 04/10/2016 - 5.8  VTE prophylaxis Diet NPO time specified  No antithrombotic prior to admission, now on aspirin 300 mg suppository daily  Patient counseled to be compliant with her antithrombotic medications  Discussion with family pending discussion on palliative care, otherwise Loop recorder is recommended  Therapy recommendations: pending   Disposition:  Anticipate return to SNF  Hypertension  Stable BP goal normotensive  Hyperlipidemia  Home meds:  Lipitor 40 mg daily not resumed in hospital  LDL 86, goal <  70  Resume Lipitor when taking POs/tube placed  Continue statin at discharge  Other Stroke Risk Factors  Advanced age  Obesity, Body mass index is 30.18 kg/m., recommend weight loss, diet and exercise as appropriate   Hx stroke/TIA  Chronic combined systolid and diastolic CHF  Other Active Problems  Encephalopathy - does nowt appear to be associated with stroke.    Mild leukocytosis 11.6 (afebrile)  COPD w/ hypoxic respiratory failure  Aspiration PNA  Hospital day # 5  I have personally examined this patient, reviewed notes, independently viewed imaging studies, participated in medical decision making and plan of care.ROS completed by me personally and pertinent positives fully documented  I have made any additions or clarifications directly to the above note.  Recommend check swallow eval and if able to swallow start aspirin. Family not available at the bedside for discussion. Discussed with Dr. Erlinda Hong. Stroke team will sign off. Kindly call for questions.  Antony Contras, MD Medical Director [facility] Pager: [phone] 05/14/2016 3:29 PM    To contact Stroke Continuity provider, please refer to [email or web address] After hours, contact General Neurology

## 2016-05-14 NOTE — Progress Notes (Signed)
Speech Language Pathology Treatment: Dysphagia  Patient Details Name: Catherine ClossRegina Day MRN: [id number] DOB: 08/06/1927 Today's Date: 05/14/2016 Time: [id number] SLP Time Calculation (min) (ACUTE ONLY): 16 min  Assessment / Plan / Recommendation Clinical Impression  Patient more alert today, oral cavity with dried secretions but vocal quality clear indicative of improving secretions management. Oral care complete to maximize safety with po trials. Patient able to orally transit both liquids and solids timely and with consistent swallow initiation. Immediate and delayed coughing noted across pos trials suggestive of decreased airway protection. Given improved mentation however, will proceed with MBS this pm to determine potential to resume a conservative po diet. MBS planned for 1300.    HPI HPI: Catherine ClossRegina Day is a 82 y.o. female who presents to the Emergency Department complaining of a fall that occurred ago. Per nurse's note, "pt arrived via EMS from [facility] after a fall".       SLP Plan  MBS     Recommendations  Diet recommendations: NPO Medication Administration: Via alternative means                Oral Care Recommendations: Oral care QID Follow up Recommendations: Skilled Nursing facility Plan: MBS       GO              Catherine LangoLeah Raphel Stickles MA, CCC-SLP [phone]   Catherine Day 05/14/2016, 10:52 AM

## 2016-05-14 NOTE — Progress Notes (Signed)
Physical Therapy Treatment Patient Details Name: Catherine ClossRegina Day MRN: [id number] DOB: 10/19/1927 Today's Date: 05/14/2016    History of Present Illness 82 y.o. female admitted on 04/28/2016 for fall (recurrent stroke and worsened R side deficits) with aspiration pneumonia    PT Comments    Patient seen for activity progression in conjunction with OT therapist for safety and cognitive assessment. At this time, patient shows significant improvements in right side activity. Patient using RUE and RLE volitionally and able to follow some commands with those extremities today. Patient also verbalizing today which is an improvement over previous session. Patient tolerated EOB and OOB to chair with increased assist. Continue to feel patient would benefit from skilled PT, recommend SNF upon discharge.  Follow Up Recommendations  SNF     Equipment Recommendations  None recommended by PT    Recommendations for Other Services       Precautions / Restrictions Precautions Precautions: Fall Restrictions Weight Bearing Restrictions: No    Mobility  Bed Mobility Overal bed mobility: Needs Assistance Bed Mobility: Rolling;Supine to Sit Rolling: Max assist   Supine to sit: Max assist;+2 for physical assistance     General bed mobility comments: Patient showing movement and intentional use of UEs today, able to reach for therapist. max assist using check pad to rotate to EOB and use elevate trunk to upright positioning. Increased effort to estabilish sitting at EOB  Transfers Overall transfer level: Needs assistance Equipment used: 2 person hand held assist Transfers: Sit to/from [employer] CorporationStand;Stand Pivot Transfers Sit to Stand: Mod assist;+2 physical assistance Stand pivot transfers: Max assist;+2 physical assistance       General transfer comment: Moderate assist of 2 persons to power up to standing, max assist to pivot transfer OOB to chair. Patient able to take on weight through LEs but showed  limited ability to initiate pivotal steps.   Ambulation/Gait                 Stairs            Wheelchair Mobility    Modified Rankin (Stroke Patients Only) Modified Rankin (Stroke Patients Only) Pre-Morbid Rankin Score: Moderately severe disability Modified Rankin: Severe disability     Balance Overall balance assessment: Needs assistance Sitting-balance support: Feet supported;Single extremity supported Sitting balance-Leahy Scale: Poor Sitting balance - Comments: able to adjust with multi modal cues to upright at midline but continues to list right Postural control: Right lateral lean Standing balance support: Bilateral upper extremity supported Standing balance-Leahy Scale: Poor Standing balance comment: heavy reliance on therapist support in upright positioning, able to maintain LEs without blocking                    Cognition Arousal/Alertness: Awake/alert Behavior During Therapy: Flat affect Overall Cognitive Status: Difficult to assess Area of Impairment: Following commands;Attention;Awareness;Problem solving       Following Commands: Follows one step commands inconsistently;Follows one step commands with increased time Safety/Judgement: Decreased awareness of deficits;Decreased awareness of safety Awareness: Emergent Problem Solving: Slow processing;Requires verbal cues      Exercises      General Comments        Pertinent Vitals/Pain Pain Assessment: Faces Faces Pain Scale: No hurt    Home Living Family/patient expects to be discharged to:: Skilled nursing facility                    Prior Function Level of Independence: Independent      Comments: patient was at rehabilitation  center recovering from recent stroke   PT Goals (current goals can now be found in the care plan section) Acute Rehab PT Goals Patient Stated Goal: none stated PT Goal Formulation: With patient Time For Goal Achievement: 05/27/16 Potential to  Achieve Goals: Fair Progress towards PT goals: Progressing toward goals    Frequency    Min 2X/week      PT Plan Current plan remains appropriate    Co-evaluation PT/OT/SLP Co-Evaluation/Treatment: Yes Reason for Co-Treatment: Complexity of the patient's impairments (multi-system involvement) PT goals addressed during session: Mobility/safety with mobility OT goals addressed during session: ADL's and self-care     End of Session Equipment Utilized During Treatment: Oxygen Activity Tolerance: Patient tolerated treatment well Patient left: in chair;with call bell/phone within reach;with chair alarm set     Time: 1104-1130 PT Time Calculation (min) (ACUTE ONLY): 26 min  Charges:  $Therapeutic Activity: 8-22 mins                    G Codes:      Fabio AsaDevon J Daziah Hesler 05/14/2016, 2:09 PM Charlotte Crumbevon Jimie Kuwahara, PT DPT  [phone]

## 2016-05-14 NOTE — Evaluation (Signed)
Speech Language Pathology Evaluation Patient Details Name: Catherine ClossRegina Day MRN: [id number] DOB: 03/18/1928 Today's Date: 05/14/2016 Time: 1000-1015 SLP Time Calculation (min) (ACUTE ONLY): 15 min  Problem List:  Patient Active Problem List   Diagnosis Date Noted  . Palliative care encounter   . Goals of care, counseling/discussion   . At high risk for aspiration   . Cerebrovascular accident (CVA) (HCC)   . Acute encephalopathy 04/23/2016  . UTI due to extended-spectrum beta lactamase (ESBL) producing Escherichia coli   . Chronic combined systolic and diastolic CHF, NYHA class 1 (HCC)   . Cerebrovascular accident (CVA) due to bilateral thrombosis of vertebral arteries (HCC)   . Hypertension 04/14/2016  . Hyperlipidemia 04/14/2016  . 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 Dysphagia 04/14/2016  . Chronic obstructive pulmonary disease (HCC)   . Hypokalemia   . Recent Embolic stroke (HCC) - B MCA and L cerebellar    Past Medical History:  Past Medical History:  Diagnosis Date  . COPD (chronic obstructive pulmonary disease) (HCC)   . Stroke [facility](HCC)    Past Surgical History:  Past Surgical History:  Procedure Laterality Date  . IR GENERIC HISTORICAL  04/09/2016   IR ANGIO INTRA EXTRACRAN SEL COM CAROTID INNOMINATE UNI R MOD SED 04/09/2016 Julieanne CottonSanjeev Deveshwar, MD [facility]-INTERV RAD  . IR GENERIC HISTORICAL  04/09/2016   IR PERCUTANEOUS ART THROMBECTOMY/INFUSION INTRACRANIAL INC DIAG ANGIO 04/09/2016 Julieanne CottonSanjeev Deveshwar, MD [facility]  . PELVIC FRACTURE SURGERY    . RADIOLOGY WITH ANESTHESIA N/A 04/09/2016   Procedure: RADIOLOGY WITH ANESTHESIA;  Surgeon: Julieanne CottonSanjeev Deveshwar, MD;  Location: [facility] OR;  Service: Radiology;  Laterality: N/A;  . TEE WITHOUT CARDIOVERSION N/A 04/13/2016   Procedure: TRANSESOPHAGEAL ECHOCARDIOGRAM (TEE);  Surgeon: Thurmon FairMihai Croitoru, MD;  [facility];  Service: Cardiovascular;  Laterality: N/A;   HPI:  Catherine ClossRegina Day is a 82 y.o. female who  presents to the Emergency Department complaining of a fall that occurred ago. Per nurse's note, "pt [facility] EMS [facility]".    Assessment / Plan / Recommendation Clinical Impression  Cognitive-linguistic evaluation complete. Patients with communication deficits impacting both expressive and language deficits. Verbal output with periods of perseveration and occassional neologisms. Visual cueing required for ability to follow basic 1 step commands. Difficult to determine extent of language deficit given impact of hearing impairment on function overall. Will continue to f/u.     SLP Assessment  Patient needs continued Speech Lanaguage Pathology Services    Follow Up Recommendations  Skilled Nursing facility    Frequency and Duration min 2x/week  2 weeks      SLP Evaluation Cognition  Overall Cognitive Status: No family/caregiver present to determine baseline cognitive functioning Arousal/Alertness: Awake/alert Orientation Level: Oriented to person;Disoriented to place;Disoriented to time;Disoriented to situation Attention: Sustained Sustained Attention: Appears intact Memory:  (TBD) Awareness: Impaired Awareness Impairment: Intellectual impairment Problem Solving: Impaired Problem Solving Impairment: Verbal basic;Verbal complex;Functional complex Comments: difficult to fully assess due to communication deficits       Comprehension  Auditory Comprehension Overall Auditory Comprehension: Impaired Yes/No Questions: Impaired Basic Biographical Questions: 0-25% accurate Commands: Impaired One Step Basic Commands: 0-24% accurate Interfering Components: Hearing EffectiveTechniques: Increased volume;Repetition;Stressing words;Visual/Gestural cues;Slowed speech Visual Recognition/Discrimination Discrimination: Not tested Reading Comprehension Reading Status: Impaired Word level: Impaired    Expression Expression Primary Mode of Expression:  Verbal Verbal Expression Overall Verbal Expression: Impaired Initiation: No impairment Automatic Speech: Name;Social Response (intact) Level of Generative/Spontaneous Verbalization: Phrase Repetition: Impaired Level of  Impairment: Word level Naming: Impairment Responsive: 76-100% accurate Confrontation: Impaired Convergent: 75-100% accurate Divergent: Not tested Verbal Errors: Neologisms Pragmatics: No impairment Written Expression Dominant Hand: Right   Oral / Motor  Oral Motor/Sensory Function Overall Oral Motor/Sensory Function: Within functional limits Motor Speech Overall Motor Speech: Appears within functional limits for tasks assessed Intelligibility: Intelligible   GO            Ferdinand LangoLeah Thunder [place] MA, CCC-SLP [phone]          [name] 05/14/2016, 1:27 PM

## 2016-05-14 NOTE — Progress Notes (Signed)
[facility]                                                                              Patient Demographics  Catherine Day, is a 82 y.o. female, DOB - 02/28/1928, ZOX:[id number]  Admit date - 04/19/2016   Admitting Physician Haydee SalterPhillip M Hobbs, MD  Outpatient Primary MD for the patient is No primary care provider on file.  Outpatient specialists:   LOS - 5  days    Chief Complaint  Patient presents with  . Fall       Brief summary   Patient is a 82 year old female with recent protracted hospitalization for left-sided embolic stroke, COPD, O2 dependent, prior hospitalization course complicated by ESBL positive Escherichia coli UTI and aspiration pneumonia. She was discharged to skilled nursing facility.   Patient was sent to the ER after a fall. She was last seen normal earlier in the evening without specific time given. Per ER notes the patient fell about 11:30 PM and at that time it was discovered that the patient was not moving the right side as per previous baseline. MRI showed acute infarcts within the left subinsular white matter, left frontal operculum the left basal ganglia with an area of punctate possible ischemia in the left cerebral peduncle.   Assessment & Plan    Acute CVA in the setting of Recent Embolic stroke (HCC) - B MCA and L cerebellar/Cerebrovascular accident (CVA) due to bilateral thrombosis of vertebral arteries  -Patient recently admitted for acute stroke requiring tPA as well as complete revascularization of occluded left MCA, left ACA and left ICA terminus in interventional radiology. Baseline deficits included continued right lower extremity weakness with associated gait disturbance and mild to moderate right upper extremity weakness. -Patient now admitted with right-sided hemiparesis, flaccid with findings of new left side multiple area strokes on MRI, also has expressive aphasia - MRA of the brain showed occlusion of the left M2 division,  acute infarct on MRIs predominantly in the lateral lenticular striate territory.  -Underwent TEE previous admission with no embolic source found; no indication to repeat echocardiogram this admission -Full stroke evaluation last admission so no indication to repeat carotid duplex, hemoglobin A1c or lipid panel this admission -- she initially failed swallow eval, not interactive, she was on npo , getting rectal asa, Repeat CT head showed evolving left MCA distribution subacute infarct, initially patient did not show any meaningful clinical improvement, palliative care consulted, and comfort care was considered - she showed drastic improvement on 12/28, smiling, interactive and follow command,  Passed swallow eval, now started dysphagia diet (puree, honey thick liquid), continue ivf for another 24hrs then d/c if consistent oral intake. Neurology input appreciated   Acute encephalopathy with hypoxic respiratory failure, aspiration pneumonia, dysphagia, aspirating -- IV Zosyn started on 12/24 - drastic improvement on 12/28, anticipate change to oral augmentin if patient continue to improve    Hypertension -Allow for permissive hypertension initially, gradually restart bp meds    Chronic combined systolic and diastolic CHF, NYHA class 1  -Euvolemic, hold diuretics -TEE previous admission revealed EF of 45-50% with grade 1 diastolic dysfunction -Discharge documentation states previous  dry weight 170 pounds with discharge weight 163 pounds    Chronic obstructive pulmonary disease with chronic respiratory failure -Reported is O2 dependent on chronic 2 L oxygen - Placed on DuoNeb's    Hyperlipidemia -Holding Lipitor while NPO  FTT: palliative care input appreciated  Code Status: DNR DVT Prophylaxis:  Lovenox  Family Communication: son  at bedside    Disposition Plan:  Drastic improvement on 12/28, now consider snf placement  Time Spent in minutes   35 minutes  Procedures:  MRI  brain MRA brain   Consultants:   Neurology  Palliative care  Antimicrobials:   IV Zosyn 12/24   Medications  Scheduled Meds: .  stroke: mapping our early stages of recovery book   Does not apply Once  . aspirin  300 mg Rectal Daily   Or  . aspirin  325 mg Oral Daily  . Chlorhexidine Gluconate Cloth  6 each Topical Q0600  . enoxaparin (LOVENOX) injection  40 mg Subcutaneous Q24H  . fluticasone furoate-vilanterol  1 puff Inhalation Daily  . ipratropium-albuterol  3 mL Nebulization QID  . mupirocin ointment  1 application Nasal BID  . nystatin   Topical TID  . piperacillin-tazobactam (ZOSYN)  IV  3.375 g Intravenous Q8H   Continuous Infusions: . dextrose 5 % and 0.45 % NaCl with KCl 20 mEq/L 50 mL/hr at 05/14/16 0731   PRN Meds:.acetaminophen **OR** acetaminophen (TYLENOL) oral liquid 160 mg/5 mL **OR** acetaminophen, glycopyrrolate, ipratropium-albuterol, senna-docusate   Antibiotics   Anti-infectives    Start     Dose/Rate Route Frequency Ordered Stop   05/10/16 0900  piperacillin-tazobactam (ZOSYN) IVPB 3.375 g     3.375 g 12.5 mL/hr over 240 Minutes Intravenous Every 8 hours 05/10/16 0859          Subjective:   [name] was seen and examined today. Drastic improvement, fully awake, smiling ,follow commands. Family at bedside  Objective:   Vitals:   05/13/16 2300 05/14/16 0000 05/14/16 0400 05/14/16 0700  BP: (!) 116/56 (!) 115/54 128/71   Pulse: 65 69 71   Resp: 20 (!) 26 (!) 24   Temp:  97.7 F (36.5 C) 97.5 F (36.4 C) 97.5 F (36.4 C)  TempSrc:  Axillary Axillary Axillary  SpO2: 97% 97% 97%   Weight:      Height:        Intake/Output Summary (Last 24 hours) at 05/14/16 0804 Last data filed at 05/14/16 4098  Gross per 24 hour  Intake           1179.5 ml  Output                0 ml  Net           1179.5 ml     Wt Readings from Last 3 Encounters:  05/08/2016 74.8 kg (165 lb)  04/27/16 74.1 kg (163 lb 5.8 oz)  04/14/16 91.2 kg (201 lb)      Exam  General: alert, follow commands  Neck: Supple, no JVD  Cardiovascular: S1 S2 clear, RRR  Respiratory: diminished, no wheezing, no rhonchi  Gastrointestinal: Soft, nontender, nondistended, + bowel sounds  Ext: no cyanosis clubbing or edema  Neuro: alert, follow commands, right hemiparesis  Skin: No rashes  Psych: smiling    Data Reviewed:  I have personally reviewed following labs and imaging studies  Micro Results Recent Results (from the past 240 hour(s))  MRSA PCR Screening     Status: Abnormal   Collection Time: 05/10/16  3:51 PM  Result Value Ref Range Status   MRSA by PCR POSITIVE (A) NEGATIVE Final    Comment:        The GeneXpert MRSA Assay (FDA approved for NASAL specimens only), is one component of a comprehensive MRSA colonization surveillance program. It is not intended to diagnose MRSA infection nor to guide or monitor treatment for MRSA infections. RESULT CALLED TO, READ BACK BY AND VERIFIED WITH: Fayrene FearingLAURA TURNER RN AT [id number] 05/10/16 BY [facility]     Radiology Reports Dg Chest 2 View  Result Date: 02-19-16 CLINICAL DATA:  82 year old female with fall.  History of COPD. EXAM: CHEST  2 VIEW COMPARISON:  Chest radiograph dated 06/25/2015 FINDINGS: There is shallow inspiration. Bibasilar streaky densities most compatible with atelectatic changes/ scarring. Infiltrate is less likely. No pleural effusion or pneumothorax. Stable cardiac silhouette. The aorta is tortuous. There is osteopenia with degenerative changes of the spine. No acute osseous pathology. IMPRESSION: Shallow inspiration with bibasilar atelectatic changes. Infiltrate is less likely. Electronically Signed   By: Elgie CollardArash  Radparvar M.D.   On: 02-19-16 03:05   Ct Head Wo Contrast  Result Date: 05/10/2016 CLINICAL DATA:  Acute encephalopathy. Recent left MCA distribution infarct. EXAM: CT HEAD WITHOUT CONTRAST TECHNIQUE: Contiguous axial images were obtained from the base of the skull  through the vertex without intravenous contrast. COMPARISON:  MRI of 02-19-16.  CT 02-19-16. FINDINGS: Brain: Expected cerebral volume loss for age. Moderate low density in the periventricular white matter likely related to small vessel disease. Evolution of left MCA distribution infarct, slightly more well-defined today. Examples in the lateral left basal ganglia, subinsular region, and extending into the periventricular white matter of the left frontal lobe. No significant mass effect or midline shift. No complicating hemorrhage, hydrocephalus, intra-axial, or extra-axial fluid collection. Vascular: Intracranial carotid atherosclerosis. Skull: Normal Sinuses/Orbits: Normal orbits and globes. Minimal ethmoid air cell mucosal thickening. Chronic bilateral mastoid effusions are unchanged. Other: None IMPRESSION: 1. Evolving left MCA distribution subacute infarct. 2. Chronic mastoid fluid bilaterally. Electronically Signed   By: Jeronimo GreavesKyle  Talbot M.D.   On: 05/10/2016 17:56   Ct Head Wo Contrast  Result Date: 02-19-16 CLINICAL DATA:  82 year old female with fall and laceration to back of the head. EXAM: CT HEAD WITHOUT CONTRAST CT CERVICAL SPINE WITHOUT CONTRAST TECHNIQUE: Multidetector CT imaging of the head and cervical spine was performed following the standard protocol without intravenous contrast. Multiplanar CT image reconstructions of the cervical spine were also generated. COMPARISON:  Brain MRI dated 04/26/2016 and head CT dated 04/09/2016 FINDINGS: CT HEAD FINDINGS Brain: There is moderate age-related atrophy and chronic microvascular ischemic changes. Focal area of hypodensity in the left corona radiata extending from the left lateral ventricle inferiorly (series 201 images 16-20) appears new since the prior CT and may represent stop a subacute and less likely an acute infarct. Associated mild edema. Correlation with clinical exam recommended. MRI is recommended if there is clinical concern for an  acute infarct. There is no acute intracranial hemorrhage. No mass effect or midline shift noted. Vascular: No hyperdense vessel or unexpected calcification. Skull: Normal. Negative for fracture or focal lesion. Sinuses/Orbits: There is partial opacification of multiple ethmoid air cells. No air-fluid levels. There is opacification of the mastoid air cells bilaterally. Other: Small right forehead scalp contusion. CT CERVICAL SPINE FINDINGS Alignment: Normal. Skull base and vertebrae: No acute fracture. No primary bone lesion or focal pathologic process. Soft tissues and spinal canal: No prevertebral fluid or swelling. No visible canal hematoma. Disc  levels: Multilevel degenerative changes with mild facet hypertrophy. Upper chest: Negative. Other: None IMPRESSION: No acute intracranial hemorrhage. Focal area of hypodensity extending from the lateral aspect of the left lateral ventricle inferiorly appears new since the prior CT and may represent a subacute or less likely an acute infarct. Correlation with clinical exam recommended. MRI is recommended if there is clinical concern for acute infarct. Moderate age-related atrophy and chronic microvascular ischemic changes. No acute/ traumatic cervical spine pathology. Electronically Signed   By: Elgie Collard M.D.   On: 05/11/2016 03:38   Ct Cervical Spine Wo Contrast  Result Date: 05/08/2016 CLINICAL DATA:  82 year old female with fall and laceration to back of the head. EXAM: CT HEAD WITHOUT CONTRAST CT CERVICAL SPINE WITHOUT CONTRAST TECHNIQUE: Multidetector CT imaging of the head and cervical spine was performed following the standard protocol without intravenous contrast. Multiplanar CT image reconstructions of the cervical spine were also generated. COMPARISON:  Brain MRI dated 04/26/2016 and head CT dated 04/09/2016 FINDINGS: CT HEAD FINDINGS Brain: There is moderate age-related atrophy and chronic microvascular ischemic changes. Focal area of hypodensity in  the left corona radiata extending from the left lateral ventricle inferiorly (series 201 images 16-20) appears new since the prior CT and may represent stop a subacute and less likely an acute infarct. Associated mild edema. Correlation with clinical exam recommended. MRI is recommended if there is clinical concern for an acute infarct. There is no acute intracranial hemorrhage. No mass effect or midline shift noted. Vascular: No hyperdense vessel or unexpected calcification. Skull: Normal. Negative for fracture or focal lesion. Sinuses/Orbits: There is partial opacification of multiple ethmoid air cells. No air-fluid levels. There is opacification of the mastoid air cells bilaterally. Other: Small right forehead scalp contusion. CT CERVICAL SPINE FINDINGS Alignment: Normal. Skull base and vertebrae: No acute fracture. No primary bone lesion or focal pathologic process. Soft tissues and spinal canal: No prevertebral fluid or swelling. No visible canal hematoma. Disc levels: Multilevel degenerative changes with mild facet hypertrophy. Upper chest: Negative. Other: None IMPRESSION: No acute intracranial hemorrhage. Focal area of hypodensity extending from the lateral aspect of the left lateral ventricle inferiorly appears new since the prior CT and may represent a subacute or less likely an acute infarct. Correlation with clinical exam recommended. MRI is recommended if there is clinical concern for acute infarct. Moderate age-related atrophy and chronic microvascular ischemic changes. No acute/ traumatic cervical spine pathology. Electronically Signed   By: Elgie Collard M.D.   On: 05/05/2016 03:38   Mr Maxine Glenn Head Wo Contrast  Result Date: 05/10/2016 CLINICAL DATA:  Acute left MCA infarct. EXAM: MRA NECK WITHOUT AND WITH CONTRAST MRA HEAD WITHOUT CONTRAST TECHNIQUE: Multiplanar and multiecho pulse sequences of the neck were obtained without and with intravenous contrast. Angiographic images of the neck were  obtained using MRA technique without and with intravenous contast.; Angiographic images of the Circle of Willis were obtained using MRA technique without intravenous contrast. CONTRAST:  14mL MULTIHANCE GADOBENATE DIMEGLUMINE 529 MG/ML IV SOLN COMPARISON:  None. FINDINGS: MRA NECK FINDINGS The study is moderately motion degraded mainly in the lower neck/ upper chest. Presumed standard 3 vessel aortic arch branching, though the origin of the left vertebral artery is not established on this study due to artifact. The brachiocephalic and subclavian arteries are grossly patent. However, motion artifact completely obscures the region of the distal brachiocephalic artery and proximal right common carotid artery. Artifact through this level also obscures a portion of the mid left common carotid  artery as well as both proximal V1 segments. The portions of the common carotid arteries which are well-visualized are widely patent. The internal carotid arteries are patent bilaterally without evidence of significant stenosis. The distal right cervical ICA is tortuous. The vertebral arteries are patent and codominant. There is no evidence of flow limiting stenosis in the V2 or V3 segments. There are areas of mild-to-moderate narrowing versus artifact in the V3 segments. The V1 segments are not well evaluated. MRA HEAD FINDINGS The study is mildly motion degraded. The visualized distal vertebral arteries are patent to the basilar and codominant. Right PICA origin is patent. Left PICA is not identified, nor are AICAs. Basilar artery is widely patent. SCA origins are patent. There is a fetal type origin of the left PCA with absent left P1. The PCAs are patent without evidence of significant proximal stenosis. Internal carotid arteries are patent from skullbase to carotid termini without evidence of significant stenosis. ACAs and right MCA are patent without evidence of major branch occlusion or flow limiting proximal stenosis. The  left M1 segment is patent with at most mild narrowing distally. There is occlusion of the left M2 inferior division at its origin. There is mild reconstitution of flow and some more distal branches. Flow is present in the left M2 superior division, though it has an attenuated appearance. No intracranial aneurysm is identified. IMPRESSION: 1. Occlusion of the left M2 inferior division. Note that the acute infarct on yesterday's MRI is predominantly in the lateral lenticulostriate territory however. 2. Motion degraded neck MRA with limited assessment of the large vessels in the upper chest and lower neck. No evidence of flow limiting cervical carotid or vertebral artery stenosis within this limitation. Electronically Signed   By: Sebastian Ache M.D.   On: 05/10/2016 08:08   Mr Maxine Glenn Neck W Wo Contrast  Result Date: 05/10/2016 CLINICAL DATA:  Acute left MCA infarct. EXAM: MRA NECK WITHOUT AND WITH CONTRAST MRA HEAD WITHOUT CONTRAST TECHNIQUE: Multiplanar and multiecho pulse sequences of the neck were obtained without and with intravenous contrast. Angiographic images of the neck were obtained using MRA technique without and with intravenous contast.; Angiographic images of the Circle of Willis were obtained using MRA technique without intravenous contrast. CONTRAST:  14mL MULTIHANCE GADOBENATE DIMEGLUMINE 529 MG/ML IV SOLN COMPARISON:  None. FINDINGS: MRA NECK FINDINGS The study is moderately motion degraded mainly in the lower neck/ upper chest. Presumed standard 3 vessel aortic arch branching, though the origin of the left vertebral artery is not established on this study due to artifact. The brachiocephalic and subclavian arteries are grossly patent. However, motion artifact completely obscures the region of the distal brachiocephalic artery and proximal right common carotid artery. Artifact through this level also obscures a portion of the mid left common carotid artery as well as both proximal V1 segments. The  portions of the common carotid arteries which are well-visualized are widely patent. The internal carotid arteries are patent bilaterally without evidence of significant stenosis. The distal right cervical ICA is tortuous. The vertebral arteries are patent and codominant. There is no evidence of flow limiting stenosis in the V2 or V3 segments. There are areas of mild-to-moderate narrowing versus artifact in the V3 segments. The V1 segments are not well evaluated. MRA HEAD FINDINGS The study is mildly motion degraded. The visualized distal vertebral arteries are patent to the basilar and codominant. Right PICA origin is patent. Left PICA is not identified, nor are AICAs. Basilar artery is widely patent. SCA origins are  patent. There is a fetal type origin of the left PCA with absent left P1. The PCAs are patent without evidence of significant proximal stenosis. Internal carotid arteries are patent from skullbase to carotid termini without evidence of significant stenosis. ACAs and right MCA are patent without evidence of major branch occlusion or flow limiting proximal stenosis. The left M1 segment is patent with at most mild narrowing distally. There is occlusion of the left M2 inferior division at its origin. There is mild reconstitution of flow and some more distal branches. Flow is present in the left M2 superior division, though it has an attenuated appearance. No intracranial aneurysm is identified. IMPRESSION: 1. Occlusion of the left M2 inferior division. Note that the acute infarct on yesterday's MRI is predominantly in the lateral lenticulostriate territory however. 2. Motion degraded neck MRA with limited assessment of the large vessels in the upper chest and lower neck. No evidence of flow limiting cervical carotid or vertebral artery stenosis within this limitation. Electronically Signed   By: Sebastian Ache M.D.   On: 05/10/2016 08:08   Mr Brain Wo Contrast  Result Date: 05-21-16 CLINICAL DATA:   Fall with right-sided weakness. EXAM: MRI HEAD WITHOUT CONTRAST TECHNIQUE: Multiplanar, multiecho pulse sequences of the brain and surrounding structures were obtained without intravenous contrast. COMPARISON:  Head CT 05/21/16 Brain MR 04/26/2016 FINDINGS: Brain: There are areas of diffusion restriction within the left caudate head, extending into the putamen; the left frontal operculum; and of the subinsular white matter. The abnormality is in close proximity to the posterior limb of the internal capsule. There is also a possible focus of diffusion restriction within the left cerebral peduncle. No acute hemorrhage. There is beginning confluent hyperintense T2-weighted signal within the periventricular white matter, most often seen in the setting of chronic microvascular ischemia. No mass lesion or midline shift. No hydrocephalus or extra-axial fluid collection. The midline structures are normal. No age advanced or lobar predominant atrophy. Vascular: Major intracranial arterial and venous sinus flow voids are preserved. No evidence of chronic microhemorrhage or amyloid angiopathy. Skull and upper cervical spine: The visualized skull base, calvarium, upper cervical spine and extracranial soft tissues are normal. Sinuses/Orbits: Bilateral mastoid effusions. Paranasal sinuses are clear. Normal orbits. IMPRESSION: 1. Acute infarct within the left subinsular white matter, left frontal operculum and left basal ganglia (predominantly the caudate head and putamen). No hemorrhage, midline shift or mass effect. 2. Punctate focus of possible ischemia within the left cerebral peduncle. 3. Chronic microvascular disease. 4. Bilateral mastoid effusions. Electronically Signed   By: Deatra Robinson M.D.   On: 05-21-16 05:39   Mr Brain Wo Contrast  Result Date: 04/26/2016 CLINICAL DATA:  Worsening hearing loss over the past 4-5 days. Stroke last month. EXAM: MRI HEAD WITHOUT CONTRAST TECHNIQUE: Multiplanar, multiecho pulse  sequences of the brain and surrounding structures were obtained without intravenous contrast. COMPARISON:  04/10/2016 FINDINGS: Brain: Small volume blood products are again seen in the mesial left temporal lobe extending to the region of inferior basal ganglia/ genu of the left internal capsule. Minimal residual diffusion signal abnormality is present in these locations related to the blood products. Abnormal trace diffusion signal elsewhere in the left greater than right cerebral hemispheres and left cerebellum on the prior study has resolved. There is minimal T2/FLAIR hyperintensity in the mesial left temporal lobe/ basal ganglia region and left temporal occipital region corresponding to the previously demonstrated infarcts. There is no evidence of acute infarct, new intracranial hemorrhage, mass, midline shift, or  extra-axial fluid collection. Bilateral periventricular white matter T2 hyperintensities are unchanged and nonspecific but compatible with mild chronic small vessel ischemic disease. There is moderate cerebral atrophy. Vascular: Major intracranial vascular flow voids are preserved. Skull and upper cervical spine: Unremarkable bone marrow signal. Sinuses/Orbits: Unremarkable orbits. Minimal posterior right ethmoid air cell mucosal thickening. Large bilateral mastoid effusions, increased from prior. Fluid is now present in the middle ear cavities bilaterally. Other: None. IMPRESSION: 1. Residual small volume blood products in the mesial left temporal lobe and left internal capsule/inferior basal ganglia region associated with subacute infarcts demonstrated on the prior MRI. 2. Resolved diffusion abnormality associated with the small infarcts elsewhere. 3. No acute infarct or new intracranial abnormality. 4. Large bilateral mastoid and middle ear effusions. Electronically Signed   By: Sebastian Ache M.D.   On: 04/26/2016 19:03   Dg Chest Port 1 View  Result Date: 06/02/16 CLINICAL DATA:  Shortness of  Breath following recent fall EXAM: PORTABLE CHEST 1 VIEW COMPARISON:  06-02-2016 FINDINGS: Cardiac shadow is stable. Bibasilar atelectatic changes are noted slightly more prominent than that seen on the prior exam. No pneumothorax or sizable effusion is seen. No bony abnormality is noted. IMPRESSION: Mild bibasilar atelectasis. Electronically Signed   By: Alcide Clever M.D.   On: June 02, 2016 21:56   Dg Chest Port 1 View  Result Date: 04/23/2016 CLINICAL DATA:  Aspiration pneumonia EXAM: PORTABLE CHEST 1 VIEW COMPARISON:  04/23/2016 at 12:20 FINDINGS: Mild streaky opacity in the right days. Left lung is clear. Pulmonary vasculature is normal. No effusions. Hilar, mediastinal and cardiac contours are unremarkable and unchanged. IMPRESSION: Streaky right base opacity. No focal confluent consolidation. No significant change in the interim. Electronically Signed   By: Ellery Plunk M.D.   On: 04/23/2016 23:41   Dg Chest Port 1 View  Result Date: 04/23/2016 CLINICAL DATA:  COPD, aspiration pneumonia. EXAM: PORTABLE CHEST 1 VIEW COMPARISON:  Portable chest x-ray of Sep 20, 2015 FINDINGS: The lungs are mildly hyperinflated. The interstitial markings are coarse. Increased density at the right lung base is consistent with the clinically suspected aspiration pneumonia. The heart and pulmonary vascularity are normal. There is calcification in the wall of the tortuous thoracic aorta. There is no pleural effusion. The bony thorax exhibits no acute abnormality. IMPRESSION: Right basilar pneumonia consistent with aspiration. COPD. Thoracic aortic atherosclerosis. When the patient can tolerate the procedure, a PA and lateral chest x-ray would be useful. Electronically Signed   By: David  [place] M.D.   On: 04/23/2016 12:34   Dg Chest Port 1 View  Result Date: 04/21/2016 CLINICAL DATA:  Initial evaluation for acute respiratory distress, wheezing. History of COPD. EXAM: PORTABLE CHEST 1 VIEW COMPARISON:  Prior radiograph  from 04/21/2016. FINDINGS: Cardiac and mediastinal silhouettes are stable in size and contour, and remain within normal limits. Atherosclerotic disease noted within the aortic arch. Lungs are hypoinflated. Changes related COPD noted. Increased right basilar opacity favored to reflect atelectasis/bronchovascular crowding, although recurrent infiltrate not excluded. Left basilar atelectasis noted. Small left pleural effusion. No pulmonary edema or pneumothorax. No acute osseous abnormality. IMPRESSION: 1. Shallow lung inflation with increased right basilar opacity, favored to reflect atelectasis/ bronchovascular crowding, although recurrent infiltrate could be considered in the correct clinical setting. 2. Small left pleural effusion with associated atelectasis. 3. COPD. Electronically Signed   By: Rise Mu M.D.   On: 04/21/2016 20:38   Dg Chest Port 1 View  Result Date: 04/21/2016 CLINICAL DATA:  Follow-up wheezing, COPD. EXAM: PORTABLE CHEST  1 VIEW COMPARISON:  04/20/2016 FINDINGS: There is hyperinflation of the lungs compatible with COPD. Heart and mediastinal contours are within normal limits. No focal opacities or effusions. No acute bony abnormality. IMPRESSION: COPD.  No active disease. Electronically Signed   By: Charlett Nose M.D.   On: 04/21/2016 08:27   Dg Chest Port 1 View  Result Date: 04/20/2016 CLINICAL DATA:  Pneumonia. EXAM: PORTABLE CHEST 1 VIEW COMPARISON:  04/16/2016. FINDINGS: Mediastinum and hilar structures are normal. Heart size stable. Low lung volumes with basilar atelectasis. Slight improvement of right base infiltrate. A component of right base bronchiectasis cannot be excluded. Tiny left pleural effusion. No pneumothorax. IMPRESSION: 1. Interim partial clearing of right base infiltrate. A component of bronchiectasis in the right base cannot be excluded. 2. Low lung volumes.  Small left pleural effusion noted. Electronically Signed   By: Maisie Fus  Register   On: 04/20/2016  07:20   Dg Chest Port 1 View  Result Date: 04/16/2016 CLINICAL DATA:  Acute onset of shortness of breath. Initial encounter. EXAM: PORTABLE CHEST 1 VIEW COMPARISON:  Chest radiograph performed 04/14/2016 FINDINGS: Persistent right basilar airspace opacity remains concerning for pneumonia. No definite pleural effusion or pneumothorax is seen. Mild vascular congestion is noted. The cardiomediastinal silhouette is mildly enlarged. No acute osseous abnormalities are identified. IMPRESSION: 1. Persistent right basilar airspace opacity remains concerning for pneumonia. 2. Mild vascular congestion and mild cardiomegaly. Electronically Signed   By: Roanna Raider M.D.   On: 04/16/2016 06:26   Dg Chest Port 1 View  Result Date: 04/14/2016 CLINICAL DATA:  Acute onset of shortness of breath and cough. Initial encounter. EXAM: PORTABLE CHEST 1 VIEW COMPARISON:  Chest radiograph performed 04/11/2016 FINDINGS: Right basilar airspace opacity raises concern for pneumonia. No pleural effusion or pneumothorax is seen. The cardiomediastinal silhouette is borderline normal in size. No acute osseous abnormalities are identified. IMPRESSION: Right basilar pneumonia noted. Electronically Signed   By: Roanna Raider M.D.   On: 04/14/2016 21:50   Dg Chest Port 1v Same Day  Result Date: 05/10/2016 CLINICAL DATA:  Dyspnea, history COPD, stroke EXAM: PORTABLE CHEST 1 VIEW COMPARISON:  Portable exam 0932 hours compared to 2016/05/27 FINDINGS: Upper normal heart size. Rotated to the RIGHT. Mediastinal contours and pulmonary vascularity normal. Atherosclerotic calcification aorta. Bronchitic changes with bibasilar atelectasis. Remaining lungs clear. No pleural effusion or pneumothorax. IMPRESSION: Bronchitic changes with bibasilar atelectasis. Aortic atherosclerosis. Electronically Signed   By: Ulyses Southward M.D.   On: 05/10/2016 09:53   Dg Knee Complete 4 Views Right  Result Date: 2016/05/27 CLINICAL DATA:  82 year old female  with fall and right knee swelling. EXAM: RIGHT KNEE - COMPLETE 4+ VIEW COMPARISON:  None. FINDINGS: No acute fracture or dislocation. The bones are osteopenic. There is meniscal chondrocalcinosis with mild osteoarthritic changes of the knees. No joint effusion. The soft tissues appear unremarkable. IMPRESSION: No acute fracture or dislocation. Electronically Signed   By: Elgie Collard M.D.   On: May 27, 2016 03:04   Dg Swallowing Func-speech Pathology  Result Date: 04/17/2016 Objective Swallowing Evaluation: Type of Study: MBS-Modified Barium Swallow Study Patient Details Name: Lelia Jons MRN: [id number] Date of Birth: [birth year] Today's Date: 04/17/2016 Time: SLP Start Time (ACUTE ONLY): 1045-SLP Stop Time (ACUTE ONLY): 1115 SLP Time Calculation (min) (ACUTE ONLY): 30 min Past Medical History: Past Medical History: Diagnosis Date . COPD (chronic obstructive pulmonary disease) (HCC)  Past Surgical History: Past Surgical History: Procedure Laterality Date . IR GENERIC HISTORICAL  04/09/2016  IR ANGIO INTRA EXTRACRAN SEL  COM CAROTID INNOMINATE UNI R MOD SED 04/09/2016 Julieanne Cotton, MD [facility] . IR GENERIC HISTORICAL  04/09/2016  IR PERCUTANEOUS ART THROMBECTOMY/INFUSION INTRACRANIAL INC DIAG ANGIO 04/09/2016 Julieanne Cotton, MD [facility] . PELVIC FRACTURE SURGERY   . RADIOLOGY WITH ANESTHESIA N/A 04/09/2016  Procedure: RADIOLOGY WITH ANESTHESIA;  Surgeon: Julieanne Cotton, MD;  Location: [facility] OR;  Service: Radiology;  Laterality: N/A; . TEE WITHOUT CARDIOVERSION N/A 04/13/2016  Procedure: TRANSESOPHAGEAL ECHOCARDIOGRAM (TEE);  Surgeon: Thurmon Fair, MD;  Location: [facility] ENDOSCOPY;  Service: Cardiovascular;  Laterality: N/A; HPI: 82 y.o. right handed female with h/o COPD. Presented 04/10/2016 with sudden onset of right-sided weakness and inability to speak. CT/MRI showed small volume of scattered acute infarct in the left hemisphere primarily in the left temporal and occipital lobes with petechial  hemorrhage in the left temporal lobe. Pt discharged to CIR 11/28, however, later that night she became dyspneic and diaphoretic. She was given albuterol nebs with some improvement. CXR with RLL infiltrate. Subjective: pt awake, pleasant Assessment / Plan / Recommendation CHL IP CLINICAL IMPRESSIONS 04/17/2016 Therapy Diagnosis Mild oral phase dysphagia;Mild pharyngeal phase dysphagia Clinical Impression Pt's swallow function demonstrates mild improvements since [facility] 11/26.  She presents with piecemeal oral bolusing; persisting delay in initiation (valleculae and pyriforms) -however, this is not necessarily considered disordered in the elderly population.  There may have been questionable, high penetration of liquids, but it was difficult to discern.  No aspiration. Pyriform sinuses appear to be asymmetric and there is mild residue remaining post-swallow. Recommend advancing diet to dysphagia 3, nectar-thick liquids for now.  Will follow for functional toleration/safety.   Impact on safety and function Mild aspiration risk   CHL IP TREATMENT RECOMMENDATION 04/17/2016 Treatment Recommendations Therapy as outlined in treatment plan below   Prognosis 04/17/2016 Prognosis for Safe Diet Advancement Good Barriers to Reach Goals -- Barriers/Prognosis Comment -- CHL IP DIET RECOMMENDATION 04/17/2016 SLP Diet Recommendations Dysphagia 3 (Mech soft) solids;Nectar thick liquid Liquid Administration via Cup Medication Administration Whole meds with puree Compensations Small sips/bites;Slow rate Postural Changes --   CHL IP OTHER RECOMMENDATIONS 04/17/2016 Recommended Consults -- Oral Care Recommendations Oral care BID Other Recommendations --   CHL IP FOLLOW UP RECOMMENDATIONS 04/17/2016 Follow up Recommendations Inpatient Rehab   CHL IP FREQUENCY AND DURATION 04/17/2016 Speech Therapy Frequency (ACUTE ONLY) min 2x/week Treatment Duration 2 weeks      CHL IP ORAL PHASE 04/17/2016 Oral Phase Impaired Oral - Pudding Teaspoon -- Oral -  Pudding Cup -- Oral - Honey Teaspoon -- Oral - Honey Cup -- Oral - Nectar Teaspoon -- Oral - Nectar Cup -- Oral - Nectar Straw -- Oral - Thin Teaspoon -- Oral - Thin Cup -- Oral - Thin Straw -- Oral - Puree Piecemeal swallowing Oral - Mech Soft Piecemeal swallowing Oral - Regular -- Oral - Multi-Consistency -- Oral - Pill -- Oral Phase - Comment --  CHL IP PHARYNGEAL PHASE 04/17/2016 Pharyngeal Phase Impaired Pharyngeal- Pudding Teaspoon -- Pharyngeal -- Pharyngeal- Pudding Cup -- Pharyngeal -- Pharyngeal- Honey Teaspoon -- Pharyngeal -- Pharyngeal- Honey Cup NT Pharyngeal -- Pharyngeal- Nectar Teaspoon -- Pharyngeal -- Pharyngeal- Nectar Cup Pharyngeal residue - pyriform;Delayed swallow initiation-pyriform sinuses Pharyngeal Material enters airway, remains ABOVE vocal cords then ejected out Pharyngeal- Nectar Straw -- Pharyngeal -- Pharyngeal- Thin Teaspoon -- Pharyngeal -- Pharyngeal- Thin Cup Delayed swallow initiation-pyriform sinuses;Pharyngeal residue - pyriform Pharyngeal Material enters airway, remains ABOVE vocal cords then ejected out Pharyngeal- Thin Straw -- Pharyngeal -- Pharyngeal- Puree Delayed swallow initiation-vallecula;Pharyngeal residue -  pyriform Pharyngeal -- Pharyngeal- Mechanical Soft Delayed swallow initiation-vallecula;Pharyngeal residue - pyriform Pharyngeal -- Pharyngeal- Regular -- Pharyngeal -- Pharyngeal- Multi-consistency -- Pharyngeal -- Pharyngeal- Pill -- Pharyngeal -- Pharyngeal Comment --  CHL IP CERVICAL ESOPHAGEAL PHASE 04/12/2016 Cervical Esophageal Phase WFL Pudding Teaspoon -- Pudding Cup -- Honey Teaspoon -- Honey Cup -- Nectar Teaspoon -- Nectar Cup -- Nectar Straw -- Thin Teaspoon -- Thin Cup -- Thin Straw -- Puree -- Mechanical Soft -- Regular -- Multi-consistency -- Pill -- Cervical Esophageal Comment -- No flowsheet data found. Blenda Mounts Laurice 04/17/2016, 11:50 AM               Lab Data:  CBC:  Recent Labs Lab 04/22/2016 0307 05/10/16 0853 05/11/16 0202  05/12/16 0255 05/13/16 0334  WBC 11.8* 11.6* 11.8* 12.9* 10.9*  NEUTROABS 9.4*  --   --   --   --   HGB 11.7* 11.7* 11.4* 11.7* 10.9*  HCT 36.2 36.6 35.8* 36.5 34.9*  MCV 86.6 85.9 86.1 86.3 87.0  PLT 196 220 218 215 217   Basic Metabolic Panel:  Recent Labs Lab 05/12/2016 0307 04/20/2016 0801 05/10/16 0853 05/11/16 0202 05/12/16 0255 05/13/16 0334  NA 136  --  138 139 139 142  K 3.5  --  3.8 3.4* 3.3* 3.6  CL 98*  --  99* 99* 103 105  CO2 29  --  29 29 28 25   GLUCOSE 127*  --  108* 109* 129* 148*  BUN 15  --  16 18 14 17   CREATININE 0.83  --  0.81 0.85 0.75 0.70  CALCIUM 8.0*  --  7.8* 7.5* 7.4* 7.1*  MG  --  1.8  --   --   --   --   PHOS  --  2.8  --   --   --   --    GFR: Estimated Creatinine Clearance: 47.8 mL/min (by C-G formula based on SCr of 0.7 mg/dL). Liver Function Tests:  Recent Labs Lab 04/24/2016 0307  AST 26  ALT 24  ALKPHOS 72  BILITOT 1.1  PROT 6.9  ALBUMIN 3.0*   No results for input(s): LIPASE, AMYLASE in the last 168 hours.  Recent Labs Lab 05/10/16 0853  AMMONIA 15   Coagulation Profile:  Recent Labs Lab 05/05/2016 0801  INR 1.10   Cardiac Enzymes:  Recent Labs Lab 04/18/2016 0307  TROPONINI <0.03   BNP (last 3 results) No results for input(s): PROBNP in the last 8760 hours. HbA1C: No results for input(s): HGBA1C in the last 72 hours. CBG:  Recent Labs Lab 05/10/16 1722  GLUCAP 83   Lipid Profile: No results for input(s): CHOL, HDL, LDLCALC, TRIG, CHOLHDL, LDLDIRECT in the last 72 hours. Thyroid Function Tests: No results for input(s): TSH, T4TOTAL, FREET4, T3FREE, THYROIDAB in the last 72 hours. Anemia Panel: No results for input(s): VITAMINB12, FOLATE, FERRITIN, TIBC, IRON, RETICCTPCT in the last 72 hours. Urine analysis:    Component Value Date/Time   COLORURINE YELLOW 05/05/2016 0419   APPEARANCEUR HAZY (A) 04/24/2016 0419   LABSPEC 1.020 04/28/2016 0419   PHURINE 5.0 05/01/2016 0419   GLUCOSEU NEGATIVE  05/14/2016 0419   HGBUR NEGATIVE 05/16/2016 0419   BILIRUBINUR NEGATIVE 04/27/2016 0419   KETONESUR NEGATIVE 05/17/2016 0419   PROTEINUR NEGATIVE 04/30/2016 0419   NITRITE NEGATIVE 05/11/2016 0419   LEUKOCYTESUR NEGATIVE 04/29/2016 0419     Melysa Schroyer M.D. PhD [facility] 05/14/2016, 8:04 AM   Between 7am to 7pm - call Pager - [phone]  After 7pm go to [email or web address] - password [email or web address]  Call night coverage person covering after 7pm

## 2016-05-14 NOTE — NC FL2 (Signed)
[place] MEDICAID FL2 LEVEL OF CARE SCREENING TOOL     IDENTIFICATION  Patient Name: Catherine ClossRegina Day Birthdate: 02/17/1928 Sex: female Admission Date (Current Location): 05/13/2016  [facility] [place]   Facility and Address:  [facility] [place], [address], [place], [place] [address]      Provider Number: [id number]  Attending Physician Name and Address:  Albertine GratesFang Mallarie Voorhies, MD  Relative Name and Phone Number:       Current Level of Care: Hospital Recommended Level of Care: Skilled Nursing Facility Prior Approval Number:    Date Approved/Denied:   PASRR Number:    Discharge Plan: SNF    Current Diagnoses: Patient Active Problem List   Diagnosis Date Noted  . Palliative care encounter   . Goals of care, counseling/discussion   . At high risk for aspiration   . Cerebrovascular accident (CVA) (HCC)   . Acute encephalopathy 04/23/2016  . UTI due to extended-spectrum beta lactamase (ESBL) producing Escherichia coli   . Chronic combined systolic and diastolic CHF, NYHA class 1 (HCC)   . Cerebrovascular accident (CVA) due to bilateral thrombosis of vertebral arteries (HCC)   . Hypertension 04/14/2016  . Hyperlipidemia 04/14/2016  . 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 Dysphagia 04/14/2016  . Chronic obstructive pulmonary disease (HCC)   . Hypokalemia   . Recent Embolic stroke (HCC) - B MCA and L cerebellar     Orientation RESPIRATION BLADDER Height & Weight     Self  O2 (3L [place]) Incontinent Weight: 165 lb (74.8 kg) Height:  5\' 2"  (157.5 cm)  BEHAVIORAL SYMPTOMS/MOOD NEUROLOGICAL BOWEL NUTRITION STATUS      Incontinent    AMBULATORY STATUS COMMUNICATION OF NEEDS Skin   Extensive Assist Verbally Normal                       Personal Care Assistance Level of Assistance    Bathing Assistance: Maximum assistance   Dressing Assistance: Maximum assistance     Functional Limitations Info  Hearing   Hearing  Info: Impaired      SPECIAL CARE FACTORS FREQUENCY  PT (By licensed PT), OT (By licensed OT)     PT Frequency: 5/wk OT Frequency: 5/wk            Contractures      Additional Factors Info  Code Status, Allergies, Isolation Precautions Code Status Info: DNR Allergies Info: NKA     Isolation Precautions Info: ESBL, MRSA     Current Medications (05/14/2016):  This is the current hospital active medication list Current Facility-Administered Medications  Medication Dose Route Frequency Provider Last Rate Last Dose  .  stroke: mapping our early stages of recovery book   Does not apply Once Russella DarAllison L Ellis, NP      . acetaminophen (TYLENOL) tablet 650 mg  650 mg Oral Q4H PRN Russella DarAllison L Ellis, NP       Or  . acetaminophen (TYLENOL) solution 650 mg  650 mg Per Tube Q4H PRN Russella DarAllison L Ellis, NP       Or  . acetaminophen (TYLENOL) suppository 650 mg  650 mg Rectal Q4H PRN Russella DarAllison L Ellis, NP   650 mg at 04/17/2016 2120  . aspirin suppository 300 mg  300 mg Rectal Daily Russella DarAllison L Ellis, NP   300 mg at 05/14/16 0930   Or  . aspirin tablet 325 mg  325 mg Oral Daily Russella DarAllison L Ellis, NP      .  Chlorhexidine Gluconate Cloth 2 % PADS 6 each  6 each Topical Q0600 Ripudeep Jenna LuoK Rai, MD   6 each at 05/14/16 0600  . dextrose 5 % and 0.45 % NaCl with KCl 20 mEq/L infusion   Intravenous Continuous Ripudeep Jenna LuoK Rai, MD 50 mL/hr at 05/14/16 0731    . enoxaparin (LOVENOX) injection 40 mg  40 mg Subcutaneous Q24H Russella DarAllison L Ellis, NP   40 mg at 05/14/16 [id number]  . fluticasone furoate-vilanterol (BREO ELLIPTA) 100-25 MCG/INH 1 puff  1 puff Inhalation Daily Russella DarAllison L Ellis, NP   1 puff at 05/10/16 0850  . glycopyrrolate (ROBINUL) injection 0.1 mg  0.1 mg Intravenous Q4H PRN Ranae PalmsSarah Elizabeth Dunn, NP      . ipratropium-albuterol (DUONEB) 0.5-2.5 (3) MG/3ML nebulizer solution 3 mL  3 mL Nebulization Q4H PRN Eston EstersAhmad Hamad, MD   3 mL at 12/18/15 2120  . ipratropium-albuterol (DUONEB) 0.5-2.5 (3) MG/3ML nebulizer solution  3 mL  3 mL Nebulization QID Ripudeep K Rai, MD   3 mL at 05/14/16 1501  . mupirocin ointment (BACTROBAN) 2 % 1 application  1 application Nasal BID Ripudeep Jenna LuoK Rai, MD   1 application at 05/14/16 0930  . nystatin (MYCOSTATIN/NYSTOP) topical powder   Topical TID Ripudeep K Rai, MD      . piperacillin-tazobactam (ZOSYN) IVPB 3.375 g  3.375 g Intravenous Q8H Allena KatzCaroline E Welles, RPH   3.375 g at 05/14/16 1337  . senna-docusate (Senokot-S) tablet 1 tablet  1 tablet Oral QHS PRN Russella DarAllison L Ellis, NP         Discharge Medications: Please see discharge summary for a list of discharge medications.  Relevant Imaging Results:  Relevant Lab Results:   Additional Information SSN:  [id number]  Burna SisUris, Jenna H, LCSW

## 2016-05-14 NOTE — Progress Notes (Signed)
Occupational Therapy Evaluation Patient Details Name: Catherine ClossRegina Day MRN: [id number] DOB: 01/28/1928 Today's Date: 05/14/2016    History of Present Illness 82 y.o. female admitted on Apr 23, 2016 for fall (recurrent stroke and worsened R side deficits) with aspiration pneumonia   Clinical Impression   PTA, pt undergoing rehab at SNF. Before that, pt was independent with ADL and mobility. Pt currently requires Max A +2 with mobility and Max A with ADL. Pt will benefit from rehab at SNF to maximize functional level of independence. Will follow acutely to address established goals.     Follow Up Recommendations  SNF;Supervision/Assistance - 24 hour    Equipment Recommendations  None recommended by OT    Recommendations for Other Services       Precautions / Restrictions Precautions Precautions: Fall Restrictions Weight Bearing Restrictions: No      Mobility Bed Mobility Overal bed mobility: Needs Assistance Bed Mobility: Rolling;Supine to Sit Rolling: Max assist   Supine to sit: Max assist;+2 for physical assistance     General bed mobility comments: Patient showing movement and intentional use of UEs today, able to reach for therapist. max assist using check pad to rotate to EOB and use elevate trunk to upright positioning. Increased effort to estabilish sitting at EOB  Transfers Overall transfer level: Needs assistance Equipment used: 2 person hand held assist Transfers: Sit to/from [employer] CorporationStand;Stand Pivot Transfers Sit to Stand: Mod assist;+2 physical assistance Stand pivot transfers: Max assist;+2 physical assistance       General transfer comment: Moderate assist of 2 persons to power up to standing, max assist to pivot transfer OOB to chair. Patient able to take on weight through LEs but showed limited ability to initiate pivotal steps.     Balance Overall balance assessment: Needs assistance Sitting-balance support: Feet supported;Single extremity supported Sitting  balance-Leahy Scale: Poor Sitting balance - Comments: able to adjust with multi modal cues to upright at midline but continues to list right Postural control: Right lateral lean Standing balance support: Bilateral upper extremity supported Standing balance-Leahy Scale: Poor Standing balance comment: heavy reliance on therapist support in upright positioning, able to maintain LEs without blocking                            ADL Overall ADL's : Needs assistance/impaired Eating/Feeding: modified diet. Max A   Grooming: Moderate assistance   Upper Body Bathing: Moderate assistance;Sitting   Lower Body Bathing: Maximal assistance   Upper Body Dressing : Maximal assistance   Lower Body Dressing: Maximal assistance   Toilet Transfer: +2 for physical assistance;Moderate assistance;Stand-pivot     Toileting - Clothing Manipulation Details (indicate cue type and reason): incontinenet     Functional mobility during ADLs: +2 for physical assistance       Vision Additional Comments: will further assess   Perception     Praxis Praxis Praxis tested?: Deficits Praxis-Other Comments: will further assess. Pt washing face on command and following other commands, yet has difficulty at times. This may be due to pt being very [facility]    Pertinent Vitals/Pain Pain Assessment: Faces Faces Pain Scale: No hurt     Hand Dominance Right   Extremity/Trunk Assessment Upper Extremity Assessment Upper Extremity Assessment: RUE deficits/detail RUE Deficits / Details: Pt attempting to use impaired RUE. Poor strength overall. Able to grasp sock with RUE but unable to use it functionall. Attempting to put RUE on chair to help with transferBrunstrom stage IV arm and III  hand RUE Coordination: decreased fine motor;decreased gross motor   Lower Extremity Assessment Lower Extremity Assessment: Defer to PT evaluation   Cervical / Trunk Assessment Cervical / Trunk Assessment: Kyphotic;Other  exceptions Cervical / Trunk Exceptions: R lateral lean   Communication Communication Communication: Expressive difficulties   Cognition Arousal/Alertness: Awake/alert Behavior During Therapy: Flat affect Overall Cognitive Status: Difficult to assess Area of Impairment: Following commands;Attention;Awareness;Problem solving       Following Commands: Follows one step commands inconsistently;Follows one step commands with increased time Safety/Judgement: Decreased awareness of deficits;Decreased awareness of safety Awareness: Emergent Problem Solving: Slow processing;Requires verbal cues     General Comments       Exercises       Shoulder Instructions      Home Living Family/patient expects to be discharged to:: Skilled nursing facility                                        Prior Functioning/Environment Level of Independence: Independent        Comments: patient was at rehabilitation center recovering from recent stroke        OT Problem List: Decreased strength;Decreased range of motion;Decreased activity tolerance;Impaired balance (sitting and/or standing);Impaired vision/perception;Decreased coordination;Decreased cognition;Decreased safety awareness;Decreased knowledge of use of DME or AE;Decreased knowledge of precautions;Cardiopulmonary status limiting activity;Impaired sensation;Impaired tone;Impaired UE functional use   OT Treatment/Interventions: Self-care/ADL training;Therapeutic exercise;Neuromuscular education;DME and/or AE instruction;Therapeutic activities;Cognitive remediation/compensation;Visual/perceptual remediation/compensation;Patient/family education;Balance training    OT Goals(Current goals can be found in the care plan section) Acute Rehab OT Goals Patient Stated Goal: none stated OT Goal Formulation: With patient/family Time For Goal Achievement: 05/30/15 Potential to Achieve Goals: Good  OT Frequency: Min 2X/week   Barriers  to D/C:            Co-evaluation PT/OT/SLP Co-Evaluation/Treatment: Yes Reason for Co-Treatment: Complexity of the patient's impairments (multi-system involvement) PT goals addressed during session: Mobility/safety with mobility OT goals addressed during session: ADL's and self-care      End of Session Equipment Utilized During Treatment: Gait belt;Oxygen Nurse Communication: Mobility status  Activity Tolerance: Patient tolerated treatment well Patient left: in chair;with call bell/phone within reach;with chair alarm set   Time: [id number] OT Time Calculation (min): 23 min Charges:  OT General Charges $OT Visit: 1 Procedure OT Evaluation $OT Eval Moderate Complexity: 1 Procedure G-Codes:    Farhana Fellows,HILLARY 05/14/2016, 2:20 PM   [facility] Clarity Ciszek, OT/L  [phone] 05/14/2016

## 2016-05-14 NOTE — Progress Notes (Signed)
Daily Progress Note   Patient Name: Catherine Day       Date: 05/14/2016 DOB: [birth year]  Age: 82 y.o. MRN#: [id number] Attending Physician: Albertine Grates, MD Primary Care Physician: No primary care provider on file. Admit Date: 06-06-2016  Reason for Consultation/Follow-up: Establishing goals of care  Subjective: Catherine Day was alert and awake on my arrival to Catherine room this afternoon. When I entered she verbalized a clear "hello, how are you?" She is very hard of hearing, but with a very loud voice she did appropriately respond to a few of my questions. She did not follow any verbal commands, but did mirror my actions with verbal cues, including opening mouth, closing eyes, turning head, and wiggle fingers and toes. She was able to wiggle Catherine fingers on both hands, and flex Catherine wrists. I could not elicit any movement on Catherine RLE. Respiratory wise, she did have audible wheezing, but no audible secretions gurgling.   Length of Stay: 5  Current Medications: Scheduled Meds:  .  stroke: mapping our early stages of recovery book   Does not apply Once  . aspirin  300 mg Rectal Daily   Or  . aspirin  325 mg Oral Daily  . Chlorhexidine Gluconate Cloth  6 each Topical Q0600  . enoxaparin (LOVENOX) injection  40 mg Subcutaneous Q24H  . fluticasone furoate-vilanterol  1 puff Inhalation Daily  . ipratropium-albuterol  3 mL Nebulization QID  . mupirocin ointment  1 application Nasal BID  . nystatin   Topical TID  . piperacillin-tazobactam (ZOSYN)  IV  3.375 g Intravenous Q8H    Continuous Infusions: . dextrose 5 % and 0.45 % NaCl with KCl 20 mEq/L 50 mL/hr at 05/14/16 0731    PRN Meds: acetaminophen **OR** acetaminophen (TYLENOL) oral liquid 160 mg/5 mL **OR** acetaminophen, glycopyrrolate,  ipratropium-albuterol, senna-docusate  Physical Exam  Constitutional: She appears ill. No distress. Nasal cannula in place.  HENT:  Head: Normocephalic and atraumatic.  Eyes: EOM are normal.  Neck: Normal range of motion.  Cardiovascular: Normal rate, regular rhythm and normal heart sounds.   Pulmonary/Chest: No accessory muscle usage. Tachypnea (RR 20) noted. She has wheezes in the right upper field and the left upper field. She has rhonchi (better compared to 12/27) in the right lower field and the left lower field.  Audible  secretions  Abdominal: Soft. Normal appearance.  Musculoskeletal:  Able to wiggle Catherine fingers and flex Catherine wrist on both hands. LLE toes move. No movement on RLE.   Neurological: She is alert.  Verbalized clear words on my arrival to Catherine room. Appropriately and consistently answered a few simple questions.   Skin: Skin is warm and dry. Bruising noted. There is pallor.  BLE cool. Slight mottling on toes bilaterally.  Psychiatric: She has a normal mood and affect. Catherine speech is delayed. She is slowed.  Nursing note and vitals reviewed.          Vital Signs: BP 128/71 (BP Location: Left Arm)   Pulse 71   Temp 97.5 F (36.4 C) (Axillary)   Resp (!) 24   Ht 5\' 2"  (1.575 m)   Wt 74.8 kg (165 lb)   SpO2 99%   BMI 30.18 kg/m  SpO2: SpO2: 99 % O2 Device: O2 Device: Nasal Cannula O2 Flow Rate: O2 Flow Rate (L/min): 3 L/min  Intake/output summary:   Intake/Output Summary (Last 24 hours) at 05/14/16 1219 Last data filed at 05/14/16 [id number]  Gross per 24 hour  Intake           1179.5 ml  Output                0 ml  Net           1179.5 ml   LBM: Last BM Date:  (unknown) Baseline Weight: Weight: 73.9 kg (163 lb) Most recent weight: Weight: 74.8 kg (165 lb)       Palliative Assessment/Data: PPS 30%  Flowsheet Rows   Flowsheet Row Most Recent Value  Intake Tab  Referral Department  Hospitalist  Unit at Time of Referral  Intermediate Care Unit  Palliative  Care Primary Diagnosis  Neurology  Palliative Care Type  New Palliative care  Reason for referral  Clarify Goals of Care  Date of Admission  05/12/2016  Date first seen by Palliative Care  05/11/16  Clinical Assessment  Palliative Performance Scale Score  10%  Psychosocial & Spiritual Assessment  Palliative Care Outcomes  Patient/Family meeting held?  Yes  Who was at the meeting?  son  Palliative Care Outcomes  Clarified goals of care, Provided end of life care assistance, Provided psychosocial or spiritual support, Counseled regarding hospice      Patient Active Problem List   Diagnosis Date Noted  . Palliative care encounter   . Goals of care, counseling/discussion   . At high risk for aspiration   . Cerebrovascular accident (CVA) (HCC)   . Acute encephalopathy 04/23/2016  . UTI due to extended-spectrum beta lactamase (ESBL) producing Escherichia coli   . Chronic combined systolic and diastolic CHF, NYHA class 1 (HCC)   . Cerebrovascular accident (CVA) due to bilateral thrombosis of vertebral arteries (HCC)   . Hypertension 04/14/2016  . Hyperlipidemia 04/14/2016  . 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 Dysphagia 04/14/2016  . Chronic obstructive pulmonary disease (HCC)   . Hypokalemia   . Recent Embolic stroke (HCC) - B MCA and L cerebellar     Palliative Care Assessment & Plan   Patient Profile: 82 y.o. female  with past medical history of COPD on home oxygen and recent stroke in November admitted on 05/12/2016 with fall and worsened right sided deficits. Recent hospitalization for stroke requiring tPA and complete revascularization of occluded left MCA, left ACA, and left ICA in interventional radiology. This hospitalization was complicated by ESBL  positive UTI and aspiration pneumonia. She was discharged to a nursing facility. MRI from this admit showed infarcts in subcortical left hemisphere. MRA with occlusion of the left M2 division. Repeat CT showed  evolving left MCA distribution subacute infarct. PT/OT evaluations pending. Patient failed speech swallow evaluation on 12/24. Palliative medicine consultation for goals of care.   Assessment: Catherine Day had a dramatic improvement from when I saw Catherine on 12/27. She is alert and now responsive to simple questions. She was also able to mirror actions with improvement noted in Catherine right hand (previously not moving). The gurgling of Catherine secretions has no subsided, as well. Speech did see Catherine this morning and felt she could tolerate a MBS, which will occur around 1300 today.   I was able to speak with Catherine son, Luisa Hart. I relayed the differences I noted in his Day, and explained the plan to proceeds with a more in-depth swallow evaluation. He was relieved to hear of Catherine improvement. He did speak with his sister at length yesterday. She had a copy of Catherine Day advanced directives, and was also able to connect with the lawyer who drafted them. After consideration of Catherine desires, and in conversations with each other, they feel that proceeding with a feeding tube, if needed, is best aligned with what she would want. I explained the importance of getting the swallow study first to determine need, and then proceed from there. He was in full agreement with this plan.   Recommendations/Plan:  DNR/DNI; continue full scope treatment  Plan for MBS today; Luisa Hart and I will sit down tomorrow around noon to determine a plan for next steps based on the results  Goals of Care and Additional Recommendations:  Limitations on Scope of Treatment: Full Scope Treatment-except DNR/DNI  Code Status: DNR   Code Status Orders        Start     Ordered   05/11/2016 0909  Do not attempt resuscitation (DNR)  Continuous    Question Answer Comment  In the event of cardiac or respiratory ARREST Do not call a "code blue"   In the event of cardiac or respiratory ARREST Do not perform Intubation, CPR, defibrillation or ACLS     In the event of cardiac or respiratory ARREST Use medication by any route, position, wound care, and other measures to relive pain and suffering. May use oxygen, suction and manual treatment of airway obstruction as needed for comfort.      04/24/2016 0908    Code Status History    Date Active Date Inactive Code Status Order ID Comments User Context   05/12/2016  8:40 AM 05/08/2016  9:08 AM Full Code [id number]  Russella Dar, NP Inpatient   04/26/2016  8:18 AM 04/29/2016  8:40 AM DNR [id number]  Russella Dar, NP ED   04/24/2016  4:55 PM 04/27/2016  7:50 PM DNR [id number]  Catarina Hartshorn, MD Inpatient   04/15/2016 12:51 AM 04/24/2016  4:54 PM Full Code [id number]  Duayne Cal, NP Inpatient   04/14/2016  2:58 PM 04/14/2016  2:58 PM Full Code [id number]  Charlton Amor, PA-C Inpatient   04/14/2016  2:58 PM 04/15/2016 12:51 AM Full Code [id number]  Charlton Amor, PA-C Inpatient   04/09/2016 11:12 PM 04/10/2016  9:15 AM Full Code [id number]  Julieanne Cotton, MD Inpatient   04/09/2016 11:12 PM 04/09/2016 11:12 PM Full Code [id number]  Rejeana Brock, MD Inpatient    Advance Directive Documentation   Flowsheet Row Most  Recent Value  Type of Advance Directive  Out of facility DNR (pink MOST or yellow form), Healthcare Power of Attorney  Pre-existing out of facility DNR order (yellow form or pink MOST form)  Yellow form placed in chart (order not valid for inpatient use)  "MOST" Form in Place?  No data       Prognosis:   Unable to determine; pt looks much better today.  Discharge Planning:  To Be Determined  Care plan was discussed with pt's son.  Thank you for allowing the Palliative Medicine Team to assist in the care of this patient.   Time In: 1210 Time Out: 1235 Total Time 25 minutes Prolonged Time Billed  no       Greater than 50%  of this time was spent counseling and coordinating care related to the above assessment and plan.  Murrell ConverseSarah Moraima Burd AGNP-C Palliative Medicine  Team     Please contact Palliative Medicine Team phone at [phone] for questions and concerns.

## 2016-05-14 NOTE — Plan of Care (Signed)
Problem: Activity: Goal: Risk for activity intolerance will decrease Outcome: Progressing Patient up to chair with physical therapy.

## 2016-05-14 NOTE — Progress Notes (Signed)
Modified Barium Swallow Progress Note  Patient Details  Name: Catherine Day MRN: [id number] Date of Birth: 11/13/1927  Today's Date: 05/14/2016  Modified Barium Swallow completed.  Full report located under Chart Review in the Imaging Section.  Brief recommendations include the following:  Clinical Impression  Patient presents with a moderate pharyngeal phase dysphagia characterized by delayed swallow initiation and decreased laryngeal closure resulting in silent penetration of liquids thinner than honey thick consistency. Note that penetration episodes are trace and intermittent in nature. Airway protection consistently noted with honey thick liquids and solids. Given fluctutations in degree of alertness/mentation and general deconditioning, respiratory fatigue, recommend initiation of conservative diet. Patient may advance solids at bedside however recommend repeat instrumental testing prior to advancing liquids.    Swallow Evaluation Recommendations       SLP Diet Recommendations: Dysphagia 1 (Puree) solids;Honey thick liquids   Liquid Administration via: Spoon   Medication Administration: Whole meds with puree   Supervision: Patient able to self feed;Full supervision/cueing for compensatory strategies   Compensations: Small sips/bites;Slow rate   Postural Changes: Seated upright at 90 degrees   Oral Care Recommendations: Oral care BID   Other Recommendations: Order thickener from pharmacy;Remove water pitcher;Prohibited food (jello, ice cream, thin soups)   Catherine Passe MA, CCC-SLP [phone]  Catherine Day Catherine Day 05/14/2016,1:35 PM

## 2016-05-15 ENCOUNTER — Inpatient Hospital Stay (HOSPITAL_COMMUNITY): Payer: Medicare Other

## 2016-05-15 LAB — BASIC METABOLIC PANEL
ANION GAP: 15 (ref 5–15)
BUN: 21 mg/dL — ABNORMAL HIGH (ref 6–20)
CHLORIDE: 105 mmol/L (ref 101–111)
CO2: 22 mmol/L (ref 22–32)
Calcium: 7.5 mg/dL — ABNORMAL LOW (ref 8.9–10.3)
Creatinine, Ser: 0.7 mg/dL (ref 0.44–1.00)
GFR calc non Af Amer: >60 mL/min (ref >60)
Glucose, Bld: 108 mg/dL — ABNORMAL HIGH (ref 65–99)
POTASSIUM: 3.2 mmol/L — AB (ref 3.5–5.1)
SODIUM: 142 mmol/L (ref 135–145)

## 2016-05-15 LAB — MAGNESIUM: MAGNESIUM: 2 mg/dL (ref 1.7–2.4)

## 2016-05-15 MED ORDER — AMOXICILLIN-POT CLAVULANATE 875-125 MG PO TABS
1.0000 | ORAL_TABLET | Freq: Two times a day (BID) | ORAL | Status: DC
  Administered 2016-05-15 – 2016-05-20 (×10): 1 via ORAL
  Filled 2016-05-15 (×11): qty 1

## 2016-05-15 MED ORDER — POTASSIUM CHLORIDE CRYS ER 20 MEQ PO TBCR
20.0000 meq | EXTENDED_RELEASE_TABLET | Freq: Once | ORAL | Status: AC
  Administered 2016-05-15: 20 meq via ORAL
  Filled 2016-05-15: qty 1

## 2016-05-15 MED ORDER — FUROSEMIDE 10 MG/ML IJ SOLN
40.0000 mg | Freq: Once | INTRAMUSCULAR | Status: AC
  Administered 2016-05-15: 40 mg via INTRAVENOUS
  Filled 2016-05-15: qty 4

## 2016-05-15 MED ORDER — MAGNESIUM SULFATE 2 GM/50ML IV SOLN
2.0000 g | Freq: Once | INTRAVENOUS | Status: AC
  Administered 2016-05-15: 2 g via INTRAVENOUS
  Filled 2016-05-15: qty 50

## 2016-05-15 NOTE — Progress Notes (Addendum)
[facility]                                                                              Patient Demographics  Catherine Day, is a 82 y.o. fHughie Day, DOB - 03/25/1928, ZOX:[id number]  Admit date - 02/13/16   Admitting Physician Haydee SalterPhillip M Hobbs, MD  Outpatient Primary MD for the patient is No primary care provider on file.  Outpatient specialists:   LOS - 6  days    Chief Complaint  Patient presents with  . Fall       Brief summary   Patient is a 82 year old female with recent protracted hospitalization for left-sided embolic stroke, COPD, O2 dependent, prior hospitalization course complicated by ESBL positive Escherichia coli UTI and aspiration pneumonia. She was discharged to skilled nursing facility.   Patient was sent to the ER after a fall. She was last seen normal earlier in the evening without specific time given. Per ER notes the patient fell about 11:30 PM and at that time it was discovered that the patient was not moving the right side as per previous baseline. MRI showed acute infarcts within the left subinsular white matter, left frontal operculum the left basal ganglia with an area of punctate possible ischemia in the left cerebral peduncle.   Assessment & Plan    Acute CVA in the setting of Recent Embolic stroke (HCC) - B MCA and L cerebellar/Cerebrovascular accident (CVA) due to bilateral thrombosis of vertebral arteries  -Patient recently admitted for acute stroke requiring tPA as well as complete revascularization of occluded left MCA, left ACA and left ICA terminus in interventional radiology. Baseline deficits prior to this hospitalization included continued right lower extremity weakness with associated gait disturbance and mild to moderate right upper extremity weakness. -Patient now admitted with right-sided hemiparesis, flaccid with findings of new left side multiple area strokes on MRI, also has expressive aphasia - MRA of the brain showed  occlusion of the left M2 division, acute infarct on MRIs predominantly in the lateral lenticular striate territory.  -Underwent TEE previous admission with no embolic source found; no indication to repeat echocardiogram this admission -Full stroke evaluation last admission so no indication to repeat carotid duplex, hemoglobin A1c or lipid panel this admission -- she initially failed swallow eval, not interactive, she was on npo , getting rectal asa, Repeat CT head showed evolving left MCA distribution subacute infarct, initially patient did not show any meaningful clinical improvement, palliative care consulted, and comfort care was considered - she showed drastic improvement on 12/28, smiling, interactive and follow command,  Passed swallow eval with dysphagia diet (puree, honey thick liquid), continue ivf for another 24hrs then d/c if consistent oral intake. Neurology consulted and signed off on 12/28    Acute encephalopathy with hypoxic respiratory failure, aspiration pneumonia, dysphagia, aspirating -- IV Zosyn started on 12/24, stopped on 12/29 - started to improved from 12/28, but mental status still fluctuating, concerns about silent aspiration, continue abx but changed to oral augmentin, strict aspiration precaution, speech following daily, family agreed to feeding tube if needed      Chronic combined systolic and diastolic CHF, NYHA class 1  --  TEE previous admission revealed EF of 45-50% with grade 1 diastolic dysfunction -home lasix held initially due to dry, npo, she was on ivf -bp start to increase, has wheezing on 12/29, one dose iv lasix, d/c ivf, change iv abx to oral , stat cxr   Hypertension -Allow for permissive hypertension initially, gradually restart home bp meds diovan -iv lasix on 12/29   chronic hypoxic respiratory failure on home o2 2liter / Chronic obstructive pulmonary disease with chronic respiratory failure - on DuoNeb's    Hyperlipidemia -Holding Lipitor  while NPO  FTT: palliative care input appreciated  6:50pm Addendum:  RN paged to report asymptomatic NSVT on tele, will check k/mag stat, supplement as well.  Code Status: DNR DVT Prophylaxis:  Lovenox  Family Communication: patient    Disposition Plan:   snf placement if patient continue to improve  Time Spent in minutes   35 minutes  Procedures:  MRI brain MRA brain   Consultants:   Neurology  Palliative care  Antimicrobials:   IV Zosyn 12/24 to 12/29   Medications  Scheduled Meds: .  stroke: mapping our early stages of recovery book   Does not apply Once  . aspirin  300 mg Rectal Daily   Or  . aspirin  325 mg Oral Daily  . enoxaparin (LOVENOX) injection  40 mg Subcutaneous Q24H  . fluticasone furoate-vilanterol  1 puff Inhalation Daily  . ipratropium-albuterol  3 mL Nebulization QID  . nystatin   Topical TID  . piperacillin-tazobactam (ZOSYN)  IV  3.375 g Intravenous Q8H   Continuous Infusions: . dextrose 5 % and 0.45 % NaCl with KCl 20 mEq/L 50 mL/hr at 05/15/16 0355   PRN Meds:.acetaminophen **OR** acetaminophen (TYLENOL) oral liquid 160 mg/5 mL **OR** acetaminophen, glycopyrrolate, ipratropium-albuterol, senna-docusate   Antibiotics   Anti-infectives    Start     Dose/Rate Route Frequency Ordered Stop   05/10/16 0900  piperacillin-tazobactam (ZOSYN) IVPB 3.375 g     3.375 g 12.5 mL/hr over 240 Minutes Intravenous Every 8 hours 05/10/16 0859          Subjective:   Catherine Day was seen and examined today. She is awake, hard of hearing, but hear better of right ear Had audible wheezing, bp elevated   Objective:   Vitals:   05/15/16 0504 05/15/16 0859 05/15/16 1203 05/15/16 1231  BP: (!) 150/90 99/80 135/74 135/74  Pulse: 67 90 94   Resp: (!) 27 (!) 21 (!) 21   Temp: 97.9 F (36.6 C) 97 F (36.1 C)  98.5 F (36.9 C)  TempSrc: Axillary Oral  Oral  SpO2: 99% 99% 98%   Weight:      Height:        Intake/Output Summary (Last 24 hours)  at 05/15/16 1324 Last data filed at 05/15/16 1610  Gross per 24 hour  Intake           2022.5 ml  Output                0 ml  Net           2022.5 ml     Wt Readings from Last 3 Encounters:  05/15/16 74.8 kg (165 lb)  04/27/16 74.1 kg (163 lb 5.8 oz)  04/14/16 91.2 kg (201 lb)     Exam  General: alert, follow commands  Neck: Supple, no JVD  Cardiovascular: S1 S2 clear, RRR  Respiratory:  Wheezing anteriorly,  no rhonchi  Gastrointestinal: Soft, nontender, nondistended, + bowel sounds  Ext: no cyanosis clubbing or edema  Neuro: alert, follow commands, right hemiparesis  Skin: No rashes  Psych: cooperative   Data Reviewed:  I have personally reviewed following labs and imaging studies  Micro Results Recent Results (from the past 240 hour(s))  MRSA PCR Screening     Status: Abnormal   Collection Time: 05/10/16  3:51 PM  Result Value Ref Range Status   MRSA by PCR POSITIVE (A) NEGATIVE Final    Comment:        The GeneXpert MRSA Assay (FDA approved for NASAL specimens only), is one component of a comprehensive MRSA colonization surveillance program. It is not intended to diagnose MRSA infection nor to guide or monitor treatment for MRSA infections. RESULT CALLED TO, READ BACK BY AND VERIFIED WITH: Fayrene Fearing RN AT 1610 05/10/16 BY [facility]     Radiology Reports Dg Chest 2 View  Result Date: 04/18/2016 CLINICAL DATA:  82 year old female with fall.  History of COPD. EXAM: CHEST  2 VIEW COMPARISON:  Chest radiograph dated 06/25/2015 FINDINGS: There is shallow inspiration. Bibasilar streaky densities most compatible with atelectatic changes/ scarring. Infiltrate is less likely. No pleural effusion or pneumothorax. Stable cardiac silhouette. The aorta is tortuous. There is osteopenia with degenerative changes of the spine. No acute osseous pathology. IMPRESSION: Shallow inspiration with bibasilar atelectatic changes. Infiltrate is less likely. Electronically  Signed   By: Elgie Collard M.D.   On: 04/17/2016 03:05   Ct Head Wo Contrast  Result Date: 05/10/2016 CLINICAL DATA:  Acute encephalopathy. Recent left MCA distribution infarct. EXAM: CT HEAD WITHOUT CONTRAST TECHNIQUE: Contiguous axial images were obtained from the base of the skull through the vertex without intravenous contrast. COMPARISON:  MRI of 05/01/2016.  CT 05/08/2016. FINDINGS: Brain: Expected cerebral volume loss for age. Moderate low density in the periventricular white matter likely related to small vessel disease. Evolution of left MCA distribution infarct, slightly more well-defined today. Examples in the lateral left basal ganglia, subinsular region, and extending into the periventricular white matter of the left frontal lobe. No significant mass effect or midline shift. No complicating hemorrhage, hydrocephalus, intra-axial, or extra-axial fluid collection. Vascular: Intracranial carotid atherosclerosis. Skull: Normal Sinuses/Orbits: Normal orbits and globes. Minimal ethmoid air cell mucosal thickening. Chronic bilateral mastoid effusions are unchanged. Other: None IMPRESSION: 1. Evolving left MCA distribution subacute infarct. 2. Chronic mastoid fluid bilaterally. Electronically Signed   By: Jeronimo Greaves M.D.   On: 05/10/2016 17:56   Ct Head Wo Contrast  Result Date: 04/27/2016 CLINICAL DATA:  82 year old female with fall and laceration to back of the head. EXAM: CT HEAD WITHOUT CONTRAST CT CERVICAL SPINE WITHOUT CONTRAST TECHNIQUE: Multidetector CT imaging of the head and cervical spine was performed following the standard protocol without intravenous contrast. Multiplanar CT image reconstructions of the cervical spine were also generated. COMPARISON:  Brain MRI dated 04/26/2016 and head CT dated 04/09/2016 FINDINGS: CT HEAD FINDINGS Brain: There is moderate age-related atrophy and chronic microvascular ischemic changes. Focal area of hypodensity in the left corona radiata extending  from the left lateral ventricle inferiorly (series 201 images 16-20) appears new since the prior CT and may represent stop a subacute and less likely an acute infarct. Associated mild edema. Correlation with clinical exam recommended. MRI is recommended if there is clinical concern for an acute infarct. There is no acute intracranial hemorrhage. No mass effect or midline shift noted. Vascular: No hyperdense vessel or unexpected calcification. Skull: Normal. Negative for fracture or focal lesion. Sinuses/Orbits: There is partial  opacification of multiple ethmoid air cells. No air-fluid levels. There is opacification of the mastoid air cells bilaterally. Other: Small right forehead scalp contusion. CT CERVICAL SPINE FINDINGS Alignment: Normal. Skull base and vertebrae: No acute fracture. No primary bone lesion or focal pathologic process. Soft tissues and spinal canal: No prevertebral fluid or swelling. No visible canal hematoma. Disc levels: Multilevel degenerative changes with mild facet hypertrophy. Upper chest: Negative. Other: None IMPRESSION: No acute intracranial hemorrhage. Focal area of hypodensity extending from the lateral aspect of the left lateral ventricle inferiorly appears new since the prior CT and may represent a subacute or less likely an acute infarct. Correlation with clinical exam recommended. MRI is recommended if there is clinical concern for acute infarct. Moderate age-related atrophy and chronic microvascular ischemic changes. No acute/ traumatic cervical spine pathology. Electronically Signed   By: Elgie Collard M.D.   On: 05/31/2016 03:38   Ct Cervical Spine Wo Contrast  Result Date: May 31, 2016 CLINICAL DATA:  82 year old female with fall and laceration to back of the head. EXAM: CT HEAD WITHOUT CONTRAST CT CERVICAL SPINE WITHOUT CONTRAST TECHNIQUE: Multidetector CT imaging of the head and cervical spine was performed following the standard protocol without intravenous contrast.  Multiplanar CT image reconstructions of the cervical spine were also generated. COMPARISON:  Brain MRI dated 04/26/2016 and head CT dated 04/09/2016 FINDINGS: CT HEAD FINDINGS Brain: There is moderate age-related atrophy and chronic microvascular ischemic changes. Focal area of hypodensity in the left corona radiata extending from the left lateral ventricle inferiorly (series 201 images 16-20) appears new since the prior CT and may represent stop a subacute and less likely an acute infarct. Associated mild edema. Correlation with clinical exam recommended. MRI is recommended if there is clinical concern for an acute infarct. There is no acute intracranial hemorrhage. No mass effect or midline shift noted. Vascular: No hyperdense vessel or unexpected calcification. Skull: Normal. Negative for fracture or focal lesion. Sinuses/Orbits: There is partial opacification of multiple ethmoid air cells. No air-fluid levels. There is opacification of the mastoid air cells bilaterally. Other: Small right forehead scalp contusion. CT CERVICAL SPINE FINDINGS Alignment: Normal. Skull base and vertebrae: No acute fracture. No primary bone lesion or focal pathologic process. Soft tissues and spinal canal: No prevertebral fluid or swelling. No visible canal hematoma. Disc levels: Multilevel degenerative changes with mild facet hypertrophy. Upper chest: Negative. Other: None IMPRESSION: No acute intracranial hemorrhage. Focal area of hypodensity extending from the lateral aspect of the left lateral ventricle inferiorly appears new since the prior CT and may represent a subacute or less likely an acute infarct. Correlation with clinical exam recommended. MRI is recommended if there is clinical concern for acute infarct. Moderate age-related atrophy and chronic microvascular ischemic changes. No acute/ traumatic cervical spine pathology. Electronically Signed   By: Elgie Collard M.D.   On: 05/31/2016 03:38   Mr Maxine Glenn Head Wo  Contrast  Result Date: 05/10/2016 CLINICAL DATA:  Acute left MCA infarct. EXAM: MRA NECK WITHOUT AND WITH CONTRAST MRA HEAD WITHOUT CONTRAST TECHNIQUE: Multiplanar and multiecho pulse sequences of the neck were obtained without and with intravenous contrast. Angiographic images of the neck were obtained using MRA technique without and with intravenous contast.; Angiographic images of the Circle of Willis were obtained using MRA technique without intravenous contrast. CONTRAST:  14mL MULTIHANCE GADOBENATE DIMEGLUMINE 529 MG/ML IV SOLN COMPARISON:  None. FINDINGS: MRA NECK FINDINGS The study is moderately motion degraded mainly in the lower neck/ upper chest. Presumed standard 3 vessel  aortic arch branching, though the origin of the left vertebral artery is not established on this study due to artifact. The brachiocephalic and subclavian arteries are grossly patent. However, motion artifact completely obscures the region of the distal brachiocephalic artery and proximal right common carotid artery. Artifact through this level also obscures a portion of the mid left common carotid artery as well as both proximal V1 segments. The portions of the common carotid arteries which are well-visualized are widely patent. The internal carotid arteries are patent bilaterally without evidence of significant stenosis. The distal right cervical ICA is tortuous. The vertebral arteries are patent and codominant. There is no evidence of flow limiting stenosis in the V2 or V3 segments. There are areas of mild-to-moderate narrowing versus artifact in the V3 segments. The V1 segments are not well evaluated. MRA HEAD FINDINGS The study is mildly motion degraded. The visualized distal vertebral arteries are patent to the basilar and codominant. Right PICA origin is patent. Left PICA is not identified, nor are AICAs. Basilar artery is widely patent. SCA origins are patent. There is a fetal type origin of the left PCA with absent left P1.  The PCAs are patent without evidence of significant proximal stenosis. Internal carotid arteries are patent from skullbase to carotid termini without evidence of significant stenosis. ACAs and right MCA are patent without evidence of major branch occlusion or flow limiting proximal stenosis. The left M1 segment is patent with at most mild narrowing distally. There is occlusion of the left M2 inferior division at its origin. There is mild reconstitution of flow and some more distal branches. Flow is present in the left M2 superior division, though it has an attenuated appearance. No intracranial aneurysm is identified. IMPRESSION: 1. Occlusion of the left M2 inferior division. Note that the acute infarct on yesterday's MRI is predominantly in the lateral lenticulostriate territory however. 2. Motion degraded neck MRA with limited assessment of the large vessels in the upper chest and lower neck. No evidence of flow limiting cervical carotid or vertebral artery stenosis within this limitation. Electronically Signed   By: Sebastian Ache M.D.   On: 05/10/2016 08:08   Mr Maxine Glenn Neck W Wo Contrast  Result Date: 05/10/2016 CLINICAL DATA:  Acute left MCA infarct. EXAM: MRA NECK WITHOUT AND WITH CONTRAST MRA HEAD WITHOUT CONTRAST TECHNIQUE: Multiplanar and multiecho pulse sequences of the neck were obtained without and with intravenous contrast. Angiographic images of the neck were obtained using MRA technique without and with intravenous contast.; Angiographic images of the Circle of Willis were obtained using MRA technique without intravenous contrast. CONTRAST:  14mL MULTIHANCE GADOBENATE DIMEGLUMINE 529 MG/ML IV SOLN COMPARISON:  None. FINDINGS: MRA NECK FINDINGS The study is moderately motion degraded mainly in the lower neck/ upper chest. Presumed standard 3 vessel aortic arch branching, though the origin of the left vertebral artery is not established on this study due to artifact. The brachiocephalic and subclavian  arteries are grossly patent. However, motion artifact completely obscures the region of the distal brachiocephalic artery and proximal right common carotid artery. Artifact through this level also obscures a portion of the mid left common carotid artery as well as both proximal V1 segments. The portions of the common carotid arteries which are well-visualized are widely patent. The internal carotid arteries are patent bilaterally without evidence of significant stenosis. The distal right cervical ICA is tortuous. The vertebral arteries are patent and codominant. There is no evidence of flow limiting stenosis in the V2 or V3 segments. There  are areas of mild-to-moderate narrowing versus artifact in the V3 segments. The V1 segments are not well evaluated. MRA HEAD FINDINGS The study is mildly motion degraded. The visualized distal vertebral arteries are patent to the basilar and codominant. Right PICA origin is patent. Left PICA is not identified, nor are AICAs. Basilar artery is widely patent. SCA origins are patent. There is a fetal type origin of the left PCA with absent left P1. The PCAs are patent without evidence of significant proximal stenosis. Internal carotid arteries are patent from skullbase to carotid termini without evidence of significant stenosis. ACAs and right MCA are patent without evidence of major branch occlusion or flow limiting proximal stenosis. The left M1 segment is patent with at most mild narrowing distally. There is occlusion of the left M2 inferior division at its origin. There is mild reconstitution of flow and some more distal branches. Flow is present in the left M2 superior division, though it has an attenuated appearance. No intracranial aneurysm is identified. IMPRESSION: 1. Occlusion of the left M2 inferior division. Note that the acute infarct on yesterday's MRI is predominantly in the lateral lenticulostriate territory however. 2. Motion degraded neck MRA with limited assessment  of the large vessels in the upper chest and lower neck. No evidence of flow limiting cervical carotid or vertebral artery stenosis within this limitation. Electronically Signed   By: Sebastian Ache M.D.   On: 05/10/2016 08:08   Mr Brain Wo Contrast  Result Date: 05/27/2016 CLINICAL DATA:  Fall with right-sided weakness. EXAM: MRI HEAD WITHOUT CONTRAST TECHNIQUE: Multiplanar, multiecho pulse sequences of the brain and surrounding structures were obtained without intravenous contrast. COMPARISON:  Head CT May 27, 2016 Brain MR 04/26/2016 FINDINGS: Brain: There are areas of diffusion restriction within the left caudate head, extending into the putamen; the left frontal operculum; and of the subinsular white matter. The abnormality is in close proximity to the posterior limb of the internal capsule. There is also a possible focus of diffusion restriction within the left cerebral peduncle. No acute hemorrhage. There is beginning confluent hyperintense T2-weighted signal within the periventricular white matter, most often seen in the setting of chronic microvascular ischemia. No mass lesion or midline shift. No hydrocephalus or extra-axial fluid collection. The midline structures are normal. No age advanced or lobar predominant atrophy. Vascular: Major intracranial arterial and venous sinus flow voids are preserved. No evidence of chronic microhemorrhage or amyloid angiopathy. Skull and upper cervical spine: The visualized skull base, calvarium, upper cervical spine and extracranial soft tissues are normal. Sinuses/Orbits: Bilateral mastoid effusions. Paranasal sinuses are clear. Normal orbits. IMPRESSION: 1. Acute infarct within the left subinsular white matter, left frontal operculum and left basal ganglia (predominantly the caudate head and putamen). No hemorrhage, midline shift or mass effect. 2. Punctate focus of possible ischemia within the left cerebral peduncle. 3. Chronic microvascular disease. 4. Bilateral  mastoid effusions. Electronically Signed   By: Deatra Robinson M.D.   On: 27-May-2016 05:39   Mr Brain Wo Contrast  Result Date: 04/26/2016 CLINICAL DATA:  Worsening hearing loss over the past 4-5 days. Stroke last month. EXAM: MRI HEAD WITHOUT CONTRAST TECHNIQUE: Multiplanar, multiecho pulse sequences of the brain and surrounding structures were obtained without intravenous contrast. COMPARISON:  04/10/2016 FINDINGS: Brain: Small volume blood products are again seen in the mesial left temporal lobe extending to the region of inferior basal ganglia/ genu of the left internal capsule. Minimal residual diffusion signal abnormality is present in these locations related to the blood products. Abnormal  trace diffusion signal elsewhere in the left greater than right cerebral hemispheres and left cerebellum on the prior study has resolved. There is minimal T2/FLAIR hyperintensity in the mesial left temporal lobe/ basal ganglia region and left temporal occipital region corresponding to the previously demonstrated infarcts. There is no evidence of acute infarct, new intracranial hemorrhage, mass, midline shift, or extra-axial fluid collection. Bilateral periventricular white matter T2 hyperintensities are unchanged and nonspecific but compatible with mild chronic small vessel ischemic disease. There is moderate cerebral atrophy. Vascular: Major intracranial vascular flow voids are preserved. Skull and upper cervical spine: Unremarkable bone marrow signal. Sinuses/Orbits: Unremarkable orbits. Minimal posterior right ethmoid air cell mucosal thickening. Large bilateral mastoid effusions, increased from prior. Fluid is now present in the middle ear cavities bilaterally. Other: None. IMPRESSION: 1. Residual small volume blood products in the mesial left temporal lobe and left internal capsule/inferior basal ganglia region associated with subacute infarcts demonstrated on the prior MRI. 2. Resolved diffusion abnormality  associated with the small infarcts elsewhere. 3. No acute infarct or new intracranial abnormality. 4. Large bilateral mastoid and middle ear effusions. Electronically Signed   By: Sebastian Ache M.D.   On: 04/26/2016 19:03   Dg Chest Port 1 View  Result Date: 06-04-16 CLINICAL DATA:  Shortness of Breath following recent fall EXAM: PORTABLE CHEST 1 VIEW COMPARISON:  06-04-16 FINDINGS: Cardiac shadow is stable. Bibasilar atelectatic changes are noted slightly more prominent than that seen on the prior exam. No pneumothorax or sizable effusion is seen. No bony abnormality is noted. IMPRESSION: Mild bibasilar atelectasis. Electronically Signed   By: Alcide Clever M.D.   On: 2016/06/04 21:56   Dg Chest Port 1 View  Result Date: 04/23/2016 CLINICAL DATA:  Aspiration pneumonia EXAM: PORTABLE CHEST 1 VIEW COMPARISON:  04/23/2016 at 12:20 FINDINGS: Mild streaky opacity in the right days. Left lung is clear. Pulmonary vasculature is normal. No effusions. Hilar, mediastinal and cardiac contours are unremarkable and unchanged. IMPRESSION: Streaky right base opacity. No focal confluent consolidation. No significant change in the interim. Electronically Signed   By: Ellery Plunk M.D.   On: 04/23/2016 23:41   Dg Chest Port 1 View  Result Date: 04/23/2016 CLINICAL DATA:  COPD, aspiration pneumonia. EXAM: PORTABLE CHEST 1 VIEW COMPARISON:  Portable chest x-ray of Sep 20, 2015 FINDINGS: The lungs are mildly hyperinflated. The interstitial markings are coarse. Increased density at the right lung base is consistent with the clinically suspected aspiration pneumonia. The heart and pulmonary vascularity are normal. There is calcification in the wall of the tortuous thoracic aorta. There is no pleural effusion. The bony thorax exhibits no acute abnormality. IMPRESSION: Right basilar pneumonia consistent with aspiration. COPD. Thoracic aortic atherosclerosis. When the patient can tolerate the procedure, a PA and lateral  chest x-ray would be useful. Electronically Signed   By: David  [place] M.D.   On: 04/23/2016 12:34   Dg Chest Port 1 View  Result Date: 04/21/2016 CLINICAL DATA:  Initial evaluation for acute respiratory distress, wheezing. History of COPD. EXAM: PORTABLE CHEST 1 VIEW COMPARISON:  Prior radiograph from 04/21/2016. FINDINGS: Cardiac and mediastinal silhouettes are stable in size and contour, and remain within normal limits. Atherosclerotic disease noted within the aortic arch. Lungs are hypoinflated. Changes related COPD noted. Increased right basilar opacity favored to reflect atelectasis/bronchovascular crowding, although recurrent infiltrate not excluded. Left basilar atelectasis noted. Small left pleural effusion. No pulmonary edema or pneumothorax. No acute osseous abnormality. IMPRESSION: 1. Shallow lung inflation with increased right basilar opacity, favored  to reflect atelectasis/ bronchovascular crowding, although recurrent infiltrate could be considered in the correct clinical setting. 2. Small left pleural effusion with associated atelectasis. 3. COPD. Electronically Signed   By: Rise Mu M.D.   On: 04/21/2016 20:38   Dg Chest Port 1 View  Result Date: 04/21/2016 CLINICAL DATA:  Follow-up wheezing, COPD. EXAM: PORTABLE CHEST 1 VIEW COMPARISON:  04/20/2016 FINDINGS: There is hyperinflation of the lungs compatible with COPD. Heart and mediastinal contours are within normal limits. No focal opacities or effusions. No acute bony abnormality. IMPRESSION: COPD.  No active disease. Electronically Signed   By: Charlett Nose M.D.   On: 04/21/2016 08:27   Dg Chest Port 1 View  Result Date: 04/20/2016 CLINICAL DATA:  Pneumonia. EXAM: PORTABLE CHEST 1 VIEW COMPARISON:  04/16/2016. FINDINGS: Mediastinum and hilar structures are normal. Heart size stable. Low lung volumes with basilar atelectasis. Slight improvement of right base infiltrate. A component of right base bronchiectasis cannot be  excluded. Tiny left pleural effusion. No pneumothorax. IMPRESSION: 1. Interim partial clearing of right base infiltrate. A component of bronchiectasis in the right base cannot be excluded. 2. Low lung volumes.  Small left pleural effusion noted. Electronically Signed   By: Maisie Fus  Register   On: 04/20/2016 07:20   Dg Chest Port 1 View  Result Date: 04/16/2016 CLINICAL DATA:  Acute onset of shortness of breath. Initial encounter. EXAM: PORTABLE CHEST 1 VIEW COMPARISON:  Chest radiograph performed 04/14/2016 FINDINGS: Persistent right basilar airspace opacity remains concerning for pneumonia. No definite pleural effusion or pneumothorax is seen. Mild vascular congestion is noted. The cardiomediastinal silhouette is mildly enlarged. No acute osseous abnormalities are identified. IMPRESSION: 1. Persistent right basilar airspace opacity remains concerning for pneumonia. 2. Mild vascular congestion and mild cardiomegaly. Electronically Signed   By: Roanna Raider M.D.   On: 04/16/2016 06:26   Dg Chest Port 1v Same Day  Result Date: 05/10/2016 CLINICAL DATA:  Dyspnea, history COPD, stroke EXAM: PORTABLE CHEST 1 VIEW COMPARISON:  Portable exam 0932 hours compared to 04/29/2016 FINDINGS: Upper normal heart size. Rotated to the RIGHT. Mediastinal contours and pulmonary vascularity normal. Atherosclerotic calcification aorta. Bronchitic changes with bibasilar atelectasis. Remaining lungs clear. No pleural effusion or pneumothorax. IMPRESSION: Bronchitic changes with bibasilar atelectasis. Aortic atherosclerosis. Electronically Signed   By: Ulyses Southward M.D.   On: 05/10/2016 09:53   Dg Knee Complete 4 Views Right  Result Date: 05/08/2016 CLINICAL DATA:  83 year old female with fall and right knee swelling. EXAM: RIGHT KNEE - COMPLETE 4+ VIEW COMPARISON:  None. FINDINGS: No acute fracture or dislocation. The bones are osteopenic. There is meniscal chondrocalcinosis with mild osteoarthritic changes of the knees. No  joint effusion. The soft tissues appear unremarkable. IMPRESSION: No acute fracture or dislocation. Electronically Signed   By: Elgie Collard M.D.   On: 04/24/2016 03:04   Dg Swallowing Func-speech Pathology  Result Date: 04/17/2016 Objective Swallowing Evaluation: Type of Study: MBS-Modified Barium Swallow Study Patient Details Name: Annalyse Langlais MRN: [id number] Date of Birth: [birth year] Today's Date: 04/17/2016 Time: SLP Start Time (ACUTE ONLY): 1045-SLP Stop Time (ACUTE ONLY): 1115 SLP Time Calculation (min) (ACUTE ONLY): 30 min Past Medical History: Past Medical History: Diagnosis Date . COPD (chronic obstructive pulmonary disease) (HCC)  Past Surgical History: Past Surgical History: Procedure Laterality Date . IR GENERIC HISTORICAL  04/09/2016  IR ANGIO INTRA EXTRACRAN SEL COM CAROTID INNOMINATE UNI R MOD SED 04/09/2016 Julieanne Cotton, MD [facility]INTER[facility] RAD . IR GENERIC HISTORICAL  04/09/2016  IR PERCUTANEOUS ART THROMBECTOMY/INFUSION  INTRACRANIAL INC DIAG ANGIO 04/09/2016 Julieanne Cotton, MD [facility] . PELVIC FRACTURE SURGERY   . RADIOLOGY WITH ANESTHESIA N/A 04/09/2016  Procedure: RADIOLOGY WITH ANESTHESIA;  Surgeon: Julieanne Cotton, MD;  Location: [facility] OR;  Service: Radiology;  Laterality: N/A; . TEE WITHOUT CARDIOVERSION N/A 04/13/2016  Procedure: TRANSESOPHAGEAL ECHOCARDIOGRAM (TEE);  Surgeon: Thurmon Fair, MD;  Location: [facility] ENDOSCOPY;  Service: Cardiovascular;  Laterality: N/A; HPI: 82 y.o. right handed female with h/o COPD. Presented 04/10/2016 with sudden onset of right-sided weakness and inability to speak. 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 Pt discharged to CIR 11/28, however, later that night she became dyspneic and diaphoretic. She was given albuterol nebs with some improvement. CXR with RLL infiltrate. Subjective: pt awake, pleasant Assessment /  Plan / Recommendation CHL IP CLINICAL IMPRESSIONS 04/17/2016 Therapy Diagnosis Mild oral phase dysphagia;Mild pharyngeal phase dysphagia Clinical Impression Pt's swallow function demonstrates mild improvements since [facility] 11/26.  She presents with piecemeal oral bolusing; persisting delay in initiation (valleculae and pyriforms) -however, this is not necessarily considered disordered in the elderly population.  There may have been questionable, high penetration of liquids, but it was difficult to discern.  No aspiration. Pyriform sinuses appear to be asymmetric and there is mild residue remaining post-swallow. Recommend advancing diet to dysphagia 3, nectar-thick liquids for now.  Will follow for functional toleration/safety.   Impact on safety and function Mild aspiration risk   CHL IP TREATMENT RECOMMENDATION 04/17/2016 Treatment Recommendations Therapy as outlined in treatment plan below   Prognosis 04/17/2016 Prognosis for Safe Diet Advancement Good Barriers to Reach Goals -- Barriers/Prognosis Comment -- CHL IP DIET RECOMMENDATION 04/17/2016 SLP Diet Recommendations Dysphagia 3 (Mech soft) solids;Nectar thick liquid Liquid Administration via Cup Medication Administration Whole meds with puree Compensations Small sips/bites;Slow rate Postural Changes --   CHL IP OTHER RECOMMENDATIONS 04/17/2016 Recommended Consults -- Oral Care Recommendations Oral care BID Other Recommendations --   CHL IP FOLLOW UP RECOMMENDATIONS 04/17/2016 Follow up Recommendations Inpatient Rehab   CHL IP FREQUENCY AND DURATION 04/17/2016 Speech Therapy Frequency (ACUTE ONLY) min 2x/week Treatment Duration 2 weeks      CHL IP ORAL PHASE 04/17/2016 Oral Phase Impaired Oral - Pudding Teaspoon -- Oral - Pudding Cup -- Oral - Honey Teaspoon -- Oral - Honey Cup -- Oral - Nectar Teaspoon -- Oral - Nectar Cup -- Oral - Nectar Straw -- Oral - Thin Teaspoon -- Oral - Thin Cup -- Oral - Thin Straw -- Oral - Puree Piecemeal swallowing Oral - Mech Soft Piecemeal  swallowing Oral - Regular -- Oral - Multi-Consistency -- Oral - Pill -- Oral Phase - Comment --  CHL IP PHARYNGEAL PHASE 04/17/2016 Pharyngeal Phase Impaired Pharyngeal- Pudding Teaspoon -- Pharyngeal -- Pharyngeal- Pudding Cup -- Pharyngeal -- Pharyngeal- Honey Teaspoon -- Pharyngeal -- Pharyngeal- Honey Cup NT Pharyngeal -- Pharyngeal- Nectar Teaspoon -- Pharyngeal -- Pharyngeal- Nectar Cup Pharyngeal residue - pyriform;Delayed swallow initiation-pyriform sinuses Pharyngeal Material enters airway, remains ABOVE vocal cords then ejected out Pharyngeal- Nectar Straw -- Pharyngeal -- Pharyngeal- Thin Teaspoon -- Pharyngeal -- Pharyngeal- Thin Cup Delayed swallow initiation-pyriform sinuses;Pharyngeal residue - pyriform Pharyngeal Material enters airway, remains ABOVE vocal cords then ejected out Pharyngeal- Thin Straw -- Pharyngeal -- Pharyngeal- Puree Delayed swallow initiation-vallecula;Pharyngeal residue - pyriform Pharyngeal -- Pharyngeal- Mechanical Soft Delayed swallow initiation-vallecula;Pharyngeal residue - pyriform Pharyngeal -- Pharyngeal- Regular -- Pharyngeal -- Pharyngeal- Multi-consistency -- Pharyngeal --  Pharyngeal- Pill -- Pharyngeal -- Pharyngeal Comment --  CHL IP CERVICAL ESOPHAGEAL PHASE 04/12/2016 Cervical Esophageal Phase WFL Pudding Teaspoon -- Pudding Cup -- Honey Teaspoon -- Honey Cup -- Nectar Teaspoon -- Nectar Cup -- Nectar Straw -- Thin Teaspoon -- Thin Cup -- Thin Straw -- Puree -- Mechanical Soft -- Regular -- Multi-consistency -- Pill -- Cervical Esophageal Comment -- No flowsheet data found. [name], Amanda Laurice 04/17/2016, 11:50 AM               Lab Data:  CBC:  Recent Labs Lab 04/25/2016 0307 05/10/16 0853 05/11/16 0202 05/12/16 0255 05/13/16 0334  WBC 11.8* 11.6* 11.8* 12.9* 10.9*  NEUTROABS 9.4*  --   --   --   --   HGB 11.7* 11.7* 11.4* 11.7* 10.9*  HCT 36.2 36.6 35.8* 36.5 34.9*  MCV 86.6 85.9 86.1 86.3 87.0  PLT 196 220 218 215 217   Basic Metabolic  Panel:  Recent Labs Lab 05/11/2016 0307 05/10/2016 0801 05/10/16 0853 05/11/16 0202 05/12/16 0255 05/13/16 0334  NA 136  --  138 139 139 142  K 3.5  --  3.8 3.4* 3.3* 3.6  CL 98*  --  99* 99* 103 105  CO2 29  --  29 29 28 25   GLUCOSE 127*  --  108* 109* 129* 148*  BUN 15  --  16 18 14 17   CREATININE 0.83  --  0.81 0.85 0.75 0.70  CALCIUM 8.0*  --  7.8* 7.5* 7.4* 7.1*  MG  --  1.8  --   --   --   --   PHOS  --  2.8  --   --   --   --    GFR: Estimated Creatinine Clearance: 47.8 mL/min (by C-G formula based on SCr of 0.7 mg/dL). Liver Function Tests:  Recent Labs Lab 04/30/2016 0307  AST 26  ALT 24  ALKPHOS 72  BILITOT 1.1  PROT 6.9  ALBUMIN 3.0*   No results for input(s): LIPASE, AMYLASE in the last 168 hours.  Recent Labs Lab 05/10/16 0853  AMMONIA 15   Coagulation Profile:  Recent Labs Lab 05/04/2016 0801  INR 1.10   Cardiac Enzymes:  Recent Labs Lab 05/10/2016 0307  TROPONINI <0.03   BNP (last 3 results) No results for input(s): PROBNP in the last 8760 hours. HbA1C: No results for input(s): HGBA1C in the last 72 hours. CBG:  Recent Labs Lab 05/10/16 1722  GLUCAP 83   Lipid Profile: No results for input(s): CHOL, HDL, LDLCALC, TRIG, CHOLHDL, LDLDIRECT in the last 72 hours. Thyroid Function Tests: No results for input(s): TSH, T4TOTAL, FREET4, T3FREE, THYROIDAB in the last 72 hours. Anemia Panel: No results for input(s): VITAMINB12, FOLATE, FERRITIN, TIBC, IRON, RETICCTPCT in the last 72 hours. Urine analysis:    Component Value Date/Time   COLORURINE YELLOW 05/08/2016 0419   APPEARANCEUR HAZY (A) 04/19/2016 0419   LABSPEC 1.020 05/13/2016 0419   PHURINE 5.0 05/14/2016 0419   GLUCOSEU NEGATIVE 05/16/2016 0419   HGBUR NEGATIVE 04/17/2016 0419   BILIRUBINUR NEGATIVE 05/08/2016 0419   KETONESUR NEGATIVE 05/07/2016 0419   PROTEINUR NEGATIVE 05/04/2016 0419   NITRITE NEGATIVE 05/15/2016 0419   LEUKOCYTESUR NEGATIVE 05/02/2016 0419     Cambrea Kirt  M.D. PhD [facility] 05/15/2016, 1:24 PM   Between 7am to 7pm - call Pager - [phone]  After 7pm go to [email or web address] - password [email or web address]  Call night coverage person covering after 7pm

## 2016-05-15 NOTE — Progress Notes (Signed)
Pharmacy Antibiotic Note  Catherine ClossRegina Day is a 82 y.o. female admitted on 05/03/2016 for fall (recurrent stroke and worsened R side deficits) with aspiration pneumonia.  Pharmacy has been consulted for zosyn dosing. Today is D#6 of zosyn. Afebrile, WBC down to 10.9. SCr stable.   Plan: Zosyn 3.375g IV q8h (4 hour infusion).  Monitor clinical picture, Tmax, CBC, LOT, switch to PO option  Height: 5\' 2"  (157.5 cm) Weight: 165 lb (74.8 kg) IBW/kg (Calculated) : 50.1  Temp (24hrs), Avg:97.5 F (36.4 C), Min:97 F (36.1 C), Max:97.9 F (36.6 C)   Recent Labs Lab 05/04/2016 0307 05/10/16 0853 05/11/16 0202 05/12/16 0255 05/13/16 0334  WBC 11.8* 11.6* 11.8* 12.9* 10.9*  CREATININE 0.83 0.81 0.85 0.75 0.70    Estimated Creatinine Clearance: 47.8 mL/min (by C-G formula based on SCr of 0.7 mg/dL).    No Known Allergies  Antimicrobials this admission: Zosyn 12/24  >>   Dose adjustments this admission: n/a  Microbiology results: 12/24 MRSA PCR: positive  Thank you for allowing pharmacy to be a part of this patient's care.  Allena Katzaroline E Nira Visscher, Pharm.D. PGY1 Pharmacy Resident 12/29/201710:47 AM Pager [phone]

## 2016-05-15 NOTE — Clinical Social Work Note (Signed)
Clinical Social Work Assessment  Patient Details  Name: Catherine ClossRegina Day MRN: [id number] Date of Birth: 11/02/1927  Date of referral:  05/15/16               Reason for consult:  Facility Placement                Permission sought to share information with:  Oceanographer[facility]ility Contact Representative Permission granted to share information::  Yes, Verbal Permission Granted  Name::     Counselling psychologistatrick  Agency::  SNF  Relationship::  son  Contact Information:     Housing/Transportation Living arrangements for the past 2 months:  [facility] Facility Source of Information:  Adult Children Patient Interpreter Needed:  None Criminal Activity/Legal Involvement Pertinent to Current Situation/Hospitalization:  No - Comment as needed Significant Relationships:  Adult Children Lives with:  Self Do you feel safe going back to the place where you live?  No Need for family participation in patient care:  Yes (Comment) (decision making)  Care giving concerns:  Son wants to make sure facility can care for patient with increased weakness   Social Worker assessment / plan:  CSW spoke with pt son about plan for pt at time of DC.  Son states [facility] but that after stroke prior to this admission he wants to make sure [facility] could care for patient with current needs.  Employment status:  Retired Health and safety inspectornsurance information:  [employer] PT Recommendations:  Skilled Nursing Facility Information / Referral to community resources:  Skilled Nursing Facility  Patient/Family's Response to care:  [facility]gree[facility] but curious about other options and wanting CSW to sent out referral.  Patient/Family's Understanding of and Emotional Response to Diagnosis, Current Treatment, and Prognosis:  Pt son very practical about patient decline and just wants what is best for her.  Emotional Assessment Appearance:  Appears stated age Attitude/Demeanor/Rapport:  Unable to  Assess Affect (typically observed):  Unable to Assess Orientation:  Oriented to Self Alcohol / Substance use:  Not Applicable Psych involvement (Current and /or in the community):  No (Comment)  Discharge Needs  Concerns to be addressed:  Care Coordination Readmission within the last 30 days:  Yes Current discharge risk:  Physical Impairment Barriers to Discharge:  Continued Medical Work up   Burna SisUris, Camia Dipinto H, LCSW 05/15/2016, 4:32 PM

## 2016-05-15 NOTE — Progress Notes (Signed)
Daily Progress Note   Patient Name: Catherine Day       Date: 05/15/2016 DOB: 02-17-1928  Age: 82 y.o. MRN#: [id number] Attending Physician: Albertine Grates, MD Primary Care Physician: No primary care provider on file. Admit Date: 04/17/2016  Reason for Consultation/Follow-up: Establishing goals of care  Subjective: Catherine Day was more lethargic compared to yesterday. She did open her eyes when I touched her arm, but fell back asleep after a few seconds. She was awake and engaged for the majority of yesterday, per her son, so I suspect she is worn out and recovering. MBS study was done yesterday and she was cleared for Dysphagia 1 diet with honey thick liquids, which she enjoyed last night. Unable to obtain any information on needs today due to lethargy, however she does have increased rhoncourous breathing audible at bedside.   Length of Stay: 6  Current Medications: Scheduled Meds:  .  stroke: mapping our early stages of recovery book   Does not apply Once  . aspirin  300 mg Rectal Daily   Or  . aspirin  325 mg Oral Daily  . enoxaparin (LOVENOX) injection  40 mg Subcutaneous Q24H  . fluticasone furoate-vilanterol  1 puff Inhalation Daily  . ipratropium-albuterol  3 mL Nebulization QID  . nystatin   Topical TID  . piperacillin-tazobactam (ZOSYN)  IV  3.375 g Intravenous Q8H    Continuous Infusions: . dextrose 5 % and 0.45 % NaCl with KCl 20 mEq/L 50 mL/hr at 05/15/16 0355    PRN Meds: acetaminophen **OR** acetaminophen (TYLENOL) oral liquid 160 mg/5 mL **OR** acetaminophen, glycopyrrolate, ipratropium-albuterol, senna-docusate  Physical Exam  Constitutional: She appears lethargic. She appears ill. No distress. Nasal cannula in place.  HENT:  Head: Normocephalic and atraumatic.  Eyes: EOM  are normal.  Neck: Normal range of motion.  Cardiovascular: Normal rate, regular rhythm and normal heart sounds.   Pulmonary/Chest: No accessory muscle usage. Tachypnea (RR 20) noted. She has no wheezes. She has rhonchi in the right upper field and the left upper field.  Audible secretions  Abdominal: Normal appearance.  Neurological: She appears lethargic.  Too lethargic to engage. No words spoken during my visit.  Skin: Skin is warm and dry. Bruising noted. There is pallor.  BLE cool. Slight mottling on toes bilaterally, unchanged from days prior.  Psychiatric:  Not interactive due to lethargy.  Nursing note and vitals reviewed.          Vital Signs: BP 135/74 (BP Location: Left Arm)   Pulse 94   Temp 98.5 F (36.9 C) (Oral)   Resp (!) 21   Ht 5\' 2"  (1.575 m)   Wt 74.8 kg (165 lb)   SpO2 98%   BMI 30.18 kg/m  SpO2: SpO2: 98 % O2 Device: O2 Device: Nasal Cannula O2 Flow Rate: O2 Flow Rate (L/min): 3 L/min  Intake/output summary:   Intake/Output Summary (Last 24 hours) at 05/15/16 1332 Last data filed at 05/15/16 [id number]  Gross per 24 hour  Intake           2022.5 ml  Output                0 ml  Net           2022.5 ml   LBM: Last BM Date:  (unknown) Baseline Weight: Weight: 73.9 kg (163 lb) Most recent weight: Weight: 74.8 kg (165 lb)       Palliative Assessment/Data: PPS 30%  Flowsheet Rows   Flowsheet Row Most Recent Value  Intake Tab  Referral Department  Hospitalist  Unit at Time of Referral  Intermediate Care Unit  Palliative Care Primary Diagnosis  Neurology  Palliative Care Type  New Palliative care  Reason for referral  Clarify Goals of Care  Date of Admission  08-Jun-2015  Date first seen by Palliative Care  05/11/16  Clinical Assessment  Palliative Performance Scale Score  10%  Psychosocial & Spiritual Assessment  Palliative Care Outcomes  Patient/Family meeting held?  Yes  Who was at the meeting?  son  Palliative Care Outcomes  Clarified goals of  care, Provided end of life care assistance, Provided psychosocial or spiritual support, Counseled regarding hospice      Patient Active Problem List   Diagnosis Date Noted  . Palliative care encounter   . Goals of care, counseling/discussion   . At high risk for aspiration   . Cerebrovascular accident (CVA) (HCC)   . Acute encephalopathy 04/23/2016  . UTI due to extended-spectrum beta lactamase (ESBL) producing Escherichia coli   . Chronic combined systolic and diastolic CHF, NYHA class 1 (HCC)   . Cerebrovascular accident (CVA) due to bilateral thrombosis of vertebral arteries (HCC)   . Hypertension 04/14/2016  . Hyperlipidemia 04/14/2016  . 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 Dysphagia 04/14/2016  . Chronic obstructive pulmonary disease (HCC)   . Hypokalemia   . Recent Embolic stroke (HCC) - B MCA and L cerebellar     Palliative Care Assessment & Plan   Patient Profile: 82 y.o. female  with past medical history of COPD on home oxygen and recent stroke in November admitted on 06/26/2015 with fall and worsened right sided deficits. MRI showed infarcts in subcortical left hemisphere. MRA with occlusion of the left M2 division. Repeat CT showed evolving left MCA distribution subacute infarct. Patient failed speech swallow evaluation on 12/24. Of note, she did have a recent hospitalization for stroke requiring tPA and complete revascularization of occluded left MCA, left ACA, and left ICA in interventional radiology. That prior hospitalization was complicated by ESBL positive UTI and aspiration pneumonia.   Assessment: Catherine Day seems to wax and wane in alertness. On 12/28 she had a dramatic shift from days prior and was awake, alert, and very interactive. She was cleared for oral intake on a dysphagia 1 with honey  thick liquids after MBS, which she enjoyed at dinner. PT and OT also saw her and advised rehab at SNF at discharge. Today, she is more lethargic and I was  unable to engage her. She does open her eyes to gentle touch, but is quick to fall back asleep. After speaking with her son, he felt she did not need a feeding tube based on her allowance and demonstrated capacity to eat and drink. He is planning for discharge to rehab once she is cleared to leave.  Recommendations/Plan:  DNR/DNI; continue full scope treatment  Plan for discharge to rehab with palliative care once cleared  **There is a safe and appropriate plan in place for Catherine Day in terms of treatment and disposition. She has no uncontrolled symptoms. I will follow-up on Monday with them if she remains here. If there are acute issues over the weekend, please call the Palliative Care team with concerns. Our group number is [phone].**  Goals of Care and Additional Recommendations:  Limitations on Scope of Treatment: Full Scope Treatment-except DNR/DNI  Code Status: DNR   Code Status Orders        Start     Ordered   May 03, 2016 0909  Do not attempt resuscitation (DNR)  Continuous    Question Answer Comment  In the event of cardiac or respiratory ARREST Do not call a "code blue"   In the event of cardiac or respiratory ARREST Do not perform Intubation, CPR, defibrillation or ACLS   In the event of cardiac or respiratory ARREST Use medication by any route, position, wound care, and other measures to relive pain and suffering. May use oxygen, suction and manual treatment of airway obstruction as needed for comfort.      May 03, 2016 0908    Code Status History    Date Active Date Inactive Code Status Order ID Comments User Context   10-03-15  8:40 AM 10-03-15  9:08 AM Full Code [id number]  Russella DarAllison L Ellis, NP Inpatient   10-03-15  8:18 AM 10-03-15  8:40 AM DNR [id number]  Russella DarAllison L Ellis, NP ED   04/24/2016  4:55 PM 04/27/2016  7:50 PM DNR [id number]  Catarina Hartshornavid Tat, MD Inpatient   04/15/2016 12:51 AM 04/24/2016  4:54 PM Full Code [id number]  Duayne CalPaul W Hoffman, NP Inpatient   04/14/2016   2:58 PM 04/14/2016  2:58 PM Full Code [id number]  Charlton Amoraniel J Angiulli, PA-C Inpatient   04/14/2016  2:58 PM 04/15/2016 12:51 AM Full Code [id number]  Charlton AmorDaniel J Angiulli, PA-C Inpatient   04/09/2016 11:12 PM 04/10/2016  9:15 AM Full Code [id number]  Julieanne CottonSanjeev Deveshwar, MD Inpatient   04/09/2016 11:12 PM 04/09/2016 11:12 PM Full Code [id number]  Rejeana BrockMcNeill P Kirkpatrick, MD Inpatient    Advance Directive Documentation   Flowsheet Row Most Recent Value  Type of Advance Directive  Out of facility DNR (pink MOST or yellow form), Healthcare Power of Attorney  Pre-existing out of facility DNR order (yellow form or pink MOST form)  Yellow form placed in chart (order not valid for inpatient use)  "MOST" Form in Place?  No data       Prognosis:   Unable to determine  Discharge Planning:  Skilled Nursing Facility for rehab with Palliative care service follow-up  Care plan was discussed with pt's son.  Thank you for allowing the Palliative Medicine Team to assist in the care of this patient.   Time In: 1240 Time Out: 1305 Total Time 25 minutes Prolonged Time Billed  no  Greater than 50%  of this time was spent counseling and coordinating care related to the above assessment and plan.  Murrell Converse AGNP-C Palliative Medicine Team     Please contact Palliative Medicine Team phone at [phone] for questions and concerns.

## 2016-05-15 NOTE — Plan of Care (Signed)
Problem: Pain Managment: Goal: General experience of comfort will improve Outcome: Progressing Patient denies pain   

## 2016-05-15 NOTE — Progress Notes (Signed)
Speech Language Pathology Treatment: Dysphagia  Patient Details Name: Catherine Day MRN: [id number] DOB: 02/04/1928 Today's Date: 05/15/2016 Time: [id number] SLP Time Calculation (min) (ACUTE ONLY): 15 min  Assessment / Plan / Recommendation Clinical Impression  Skilled treatment session focused on dysphagia goals. SLP facilitated session by providing trials of honey thick via spoon. Pt with audible rhoncourous breathing at rest. Pt's vitals were stable. SLP provided Max A multimodal cues for cough to see if pt's breathing improved. Pt unable to produce cough. Trials of honey thick given via teaspoon with what appeared to be timely swallow initiation and no change in breathing or vitals. Son present and education provided on how to thicken liquids. He stated that he understood. RT entered room during last portion of session and was to administered breathing treatment for audible sound when breathing.  Although pt was alert during session, she continues to benefit from conservative diet to compensatory for respiratory fatigue and fluctuations in mentation. Education provided to nursing on swallow strategy of honey thick by SPOON ONLY.    HPI HPI: Catherine Day is a 82 y.o. female who presents to the Emergency Department complaining of a fall that occurred ago. Per nurse's note, "pt arrived via EMS from [facility] after a fall".       SLP Plan  Continue with current plan of care     Recommendations  Diet recommendations: Dysphagia 1 (puree);Honey-thick liquid Liquids provided via: Teaspoon Medication Administration: Whole meds with puree Supervision: Staff to assist with self feeding;Full supervision/cueing for compensatory strategies Compensations: Small sips/bites;Slow rate Postural Changes and/or Swallow Maneuvers: Seated upright 90 degrees                Oral Care Recommendations: Oral care BID Follow up Recommendations: Skilled Nursing facility Plan: Continue with current  plan of care       GO              Catherine Day B. Dreama Saaverton, M.S., CCC-SLP Speech-Language Pathologist   Catherine Day 05/15/2016, 3:40 PM

## 2016-05-16 LAB — CBC
HEMATOCRIT: 36.5 % (ref 36.0–46.0)
Hemoglobin: 11.4 g/dL — ABNORMAL LOW (ref 12.0–15.0)
MCH: 27.3 pg (ref 26.0–34.0)
MCHC: 31.2 g/dL (ref 30.0–36.0)
MCV: 87.3 fL (ref 78.0–100.0)
Platelets: 309 10*3/uL (ref 150–400)
RBC: 4.18 MIL/uL (ref 3.87–5.11)
RDW: 15.8 % — AB (ref 11.5–15.5)
WBC: 9.9 10*3/uL (ref 4.0–10.5)

## 2016-05-16 LAB — COMPREHENSIVE METABOLIC PANEL
ALBUMIN: 2.5 g/dL — AB (ref 3.5–5.0)
ALT: 13 U/L — ABNORMAL LOW (ref 14–54)
AST: 20 U/L (ref 15–41)
Alkaline Phosphatase: 54 U/L (ref 38–126)
Anion gap: 13 (ref 5–15)
BILIRUBIN TOTAL: 0.7 mg/dL (ref 0.3–1.2)
BUN: 18 mg/dL (ref 6–20)
CO2: 26 mmol/L (ref 22–32)
Calcium: 7.3 mg/dL — ABNORMAL LOW (ref 8.9–10.3)
Chloride: 107 mmol/L (ref 101–111)
Creatinine, Ser: 0.67 mg/dL (ref 0.44–1.00)
GFR calc Af Amer: >60 mL/min (ref >60)
GFR calc non Af Amer: >60 mL/min (ref >60)
GLUCOSE: 107 mg/dL — AB (ref 65–99)
POTASSIUM: 3.3 mmol/L — AB (ref 3.5–5.1)
Sodium: 146 mmol/L — ABNORMAL HIGH (ref 135–145)
TOTAL PROTEIN: 6.9 g/dL (ref 6.5–8.1)

## 2016-05-16 LAB — MAGNESIUM: Magnesium: 2.4 mg/dL (ref 1.7–2.4)

## 2016-05-16 MED ORDER — METHYLPREDNISOLONE SODIUM SUCC 125 MG IJ SOLR
60.0000 mg | Freq: Three times a day (TID) | INTRAMUSCULAR | Status: DC
  Administered 2016-05-16 – 2016-05-18 (×6): 60 mg via INTRAVENOUS
  Filled 2016-05-16 (×6): qty 2

## 2016-05-16 MED ORDER — CARVEDILOL 3.125 MG PO TABS
3.1250 mg | ORAL_TABLET | Freq: Two times a day (BID) | ORAL | Status: DC
  Administered 2016-05-16 (×2): 3.125 mg via ORAL
  Filled 2016-05-16 (×2): qty 1

## 2016-05-16 MED ORDER — POTASSIUM CHLORIDE CRYS ER 20 MEQ PO TBCR
40.0000 meq | EXTENDED_RELEASE_TABLET | Freq: Once | ORAL | Status: AC
  Administered 2016-05-16: 40 meq via ORAL
  Filled 2016-05-16: qty 2

## 2016-05-16 NOTE — Progress Notes (Signed)
[facility]                                                                              Patient Demographics  Catherine Day, is a 82 y.o. female, DOB - December 22, 1927, XLK:[id number]  Admit date - 05/01/2016   Admitting Physician Haydee Salter, MD  Outpatient Primary MD for the patient is No primary care provider on file.  Outpatient specialists:   LOS - 7  days    Chief Complaint  Patient presents with  . Fall       Brief summary   Patient is a 82 year old female with recent protracted hospitalization for left-sided embolic stroke, COPD, O2 dependent, prior hospitalization course complicated by ESBL positive Escherichia coli UTI and aspiration pneumonia. She was discharged to skilled nursing facility.   Patient was sent to the ER after a fall. She was last seen normal earlier in the evening without specific time given. Per ER notes the patient fell about 11:30 PM and at that time it was discovered that the patient was not moving the right side as per previous baseline. MRI showed acute infarcts within the left subinsular white matter, left frontal operculum the left basal ganglia with an area of punctate possible ischemia in the left cerebral peduncle.   Assessment & Plan    Acute CVA in the setting of Recent Embolic stroke (HCC) - B MCA and L cerebellar/Cerebrovascular accident (CVA) due to bilateral thrombosis of vertebral arteries  -Patient recently admitted for acute stroke requiring tPA as well as complete revascularization of occluded left MCA, left ACA and left ICA terminus in interventional radiology. Baseline deficits prior to this hospitalization included continued right lower extremity weakness with associated gait disturbance and mild to moderate right upper extremity weakness. -Patient now admitted with right-sided hemiparesis, flaccid with findings of new left side multiple area strokes on MRI, also has expressive aphasia - MRA of the brain showed  occlusion of the left M2 division, acute infarct on MRIs predominantly in the lateral lenticular striate territory.  -Underwent TEE previous admission with no embolic source found; no indication to repeat echocardiogram this admission -Full stroke evaluation last admission so no indication to repeat carotid duplex, hemoglobin A1c or lipid panel this admission -- she initially failed swallow eval, not interactive, she was on npo , getting rectal asa, Repeat CT head showed evolving left MCA distribution subacute infarct, initially patient did not show any meaningful clinical improvement, palliative care consulted, and comfort care was considered - she showed drastic improvement on 12/28, smiling, interactive and follow command,  Passed swallow eval with dysphagia diet (puree, honey thick liquid), continue ivf for another 24hrs then d/c if consistent oral intake. Neurology consulted and signed off on 12/28 -on 12/29 and 12/30 patient seems drowsy and weak again, very poor oral intake, may need to discuss feeding tube if no improvement over the weekend    Acute encephalopathy with hypoxic respiratory failure, aspiration pneumonia, dysphagia, aspirating -- IV Zosyn started on 12/24, stopped on 12/29 - started to improved from 12/28, but mental status still fluctuating, concerns about silent aspiration, continue abx but changed to oral augmentin, strict  aspiration precaution, speech following daily, family agreed to feeding tube if needed -on 12/29 and 12/30 patient seems drowsy and weak again, very poor oral intake, may need to discuss feeding tube if no improvement over the weekend      Chronic combined systolic and diastolic CHF, NYHA class 1  --TEE previous admission revealed EF of 45-50% with grade 1 diastolic dysfunction -home lasix held initially due to dry, npo, she was on ivf -bp start to increase, has wheezing on 12/29, one dose iv lasix, d/c ivf, change iv abx to oral , stat cxr no acute  findings on 12/29  NSVT: will check tsh, keep k>4, mag>2, start low dose coreg   Hypertension -Allow for permissive hypertension initially, gradually restart home bp meds diovan, low dose coreg started on 12/30 due to NSVT -iv lasix on 12/29   chronic hypoxic respiratory failure on home o2 2liter / Chronic obstructive pulmonary disease with chronic respiratory failure - on DuoNeb's -wheezing persistently continue nebs ,abx, add steroids    Hyperlipidemia -Holding Lipitor while NPO  FTT: palliative care input appreciated   Code Status: DNR DVT Prophylaxis:  Lovenox  Family Communication: patient    Disposition Plan:   May need feeding tube placement,  Time Spent in minutes   35 minutes  Procedures:  MRI brain MRA brain   Consultants:   Neurology  Palliative care  Antimicrobials:   IV Zosyn 12/24 to 12/29  augmentin from 12/29   Medications  Scheduled Meds: .  stroke: mapping our early stages of recovery book   Does not apply Once  . amoxicillin-clavulanate  1 tablet Oral Q12H  . aspirin  300 mg Rectal Daily   Or  . aspirin  325 mg Oral Daily  . enoxaparin (LOVENOX) injection  40 mg Subcutaneous Q24H  . fluticasone furoate-vilanterol  1 puff Inhalation Daily  . ipratropium-albuterol  3 mL Nebulization QID  . nystatin   Topical TID  . potassium chloride  40 mEq Oral Once   Continuous Infusions:  PRN Meds:.acetaminophen **OR** acetaminophen (TYLENOL) oral liquid 160 mg/5 mL **OR** acetaminophen, glycopyrrolate, ipratropium-albuterol, senna-docusate   Antibiotics   Anti-infectives    Start     Dose/Rate Route Frequency Ordered Stop   05/15/16 2200  amoxicillin-clavulanate (AUGMENTIN) 875-125 MG per tablet 1 tablet     1 tablet Oral Every 12 hours 05/15/16 1634     05/10/16 0900  piperacillin-tazobactam (ZOSYN) IVPB 3.375 g  Status:  Discontinued     3.375 g 12.5 mL/hr over 240 Minutes Intravenous Every 8 hours 05/10/16 0859 05/15/16 1633         Subjective:   Fredricka Kohrs was seen and examined today. Patient showed drastic improvement on 12/28, however, she appear drowsy and seems weaker on 12/29 and 30,  She has been having poor oral intake Has audible wheezing, bp elevated, had a few runs on asymptomatic NSVT   Objective:   Vitals:   05/15/16 1709 05/15/16 1946 05/15/16 2342 05/16/16 0400  BP: (!) 153/91 129/67 (!) 128/57 (!) 141/63  Pulse:  81 84 81  Resp:  17 19 15   Temp: 97.6 F (36.4 C) 97.5 F (36.4 C) 98.8 F (37.1 C) 97.8 F (36.6 C)  TempSrc: Oral Oral Axillary Oral  SpO2:  96% 100% 97%  Weight:      Height:        Intake/Output Summary (Last 24 hours) at 05/16/16 0817 Last data filed at 05/15/16 0925  Gross per 24 hour  Intake  360 ml  Output                0 ml  Net              360 ml     Wt Readings from Last 3 Encounters:  05/07/2016 74.8 kg (165 lb)  04/27/16 74.1 kg (163 lb 5.8 oz)  04/14/16 91.2 kg (201 lb)     Exam  General: drowsy, open eyes to voice  Neck: Supple, no JVD  Cardiovascular: S1 S2 clear, RRR  Respiratory:  Diffuse bilateral Wheezing,  no rhonchi  Gastrointestinal: Soft, nontender, nondistended, + bowel sounds  Ext: trace pitting edema  Neuro:  right hemiparesis  Skin: No rashes  Psych: drowsy   Data Reviewed:  I have personally reviewed following labs and imaging studies  Micro Results Recent Results (from the past 240 hour(s))  MRSA PCR Screening     Status: Abnormal   Collection Time: 05/10/16  3:51 PM  Result Value Ref Range Status   MRSA by PCR POSITIVE (A) NEGATIVE Final    Comment:        The GeneXpert MRSA Assay (FDA approved for NASAL specimens only), is one component of a comprehensive MRSA colonization surveillance program. It is not intended to diagnose MRSA infection nor to guide or monitor treatment for MRSA infections. RESULT CALLED TO, READ BACK BY AND VERIFIED WITH: Fayrene Fearing RN AT 1610 05/10/16 BY  [facility]     Radiology Reports Dg Chest 2 View  Result Date: 04/17/2016 CLINICAL DATA:  82 year old female with fall.  History of COPD. EXAM: CHEST  2 VIEW COMPARISON:  Chest radiograph dated 06/25/2015 FINDINGS: There is shallow inspiration. Bibasilar streaky densities most compatible with atelectatic changes/ scarring. Infiltrate is less likely. No pleural effusion or pneumothorax. Stable cardiac silhouette. The aorta is tortuous. There is osteopenia with degenerative changes of the spine. No acute osseous pathology. IMPRESSION: Shallow inspiration with bibasilar atelectatic changes. Infiltrate is less likely. Electronically Signed   By: Elgie Collard M.D.   On: 05/07/2016 03:05   Ct Head Wo Contrast  Result Date: 05/10/2016 CLINICAL DATA:  Acute encephalopathy. Recent left MCA distribution infarct. EXAM: CT HEAD WITHOUT CONTRAST TECHNIQUE: Contiguous axial images were obtained from the base of the skull through the vertex without intravenous contrast. COMPARISON:  MRI of 05/12/2016.  CT 04/26/2016. FINDINGS: Brain: Expected cerebral volume loss for age. Moderate low density in the periventricular white matter likely related to small vessel disease. Evolution of left MCA distribution infarct, slightly more well-defined today. Examples in the lateral left basal ganglia, subinsular region, and extending into the periventricular white matter of the left frontal lobe. No significant mass effect or midline shift. No complicating hemorrhage, hydrocephalus, intra-axial, or extra-axial fluid collection. Vascular: Intracranial carotid atherosclerosis. Skull: Normal Sinuses/Orbits: Normal orbits and globes. Minimal ethmoid air cell mucosal thickening. Chronic bilateral mastoid effusions are unchanged. Other: None IMPRESSION: 1. Evolving left MCA distribution subacute infarct. 2. Chronic mastoid fluid bilaterally. Electronically Signed   By: Jeronimo Greaves M.D.   On: 05/10/2016 17:56   Ct Head Wo  Contrast  Result Date: 05/02/2016 CLINICAL DATA:  82 year old female with fall and laceration to back of the head. EXAM: CT HEAD WITHOUT CONTRAST CT CERVICAL SPINE WITHOUT CONTRAST TECHNIQUE: Multidetector CT imaging of the head and cervical spine was performed following the standard protocol without intravenous contrast. Multiplanar CT image reconstructions of the cervical spine were also generated. COMPARISON:  Brain MRI dated 04/26/2016 and head CT dated 04/09/2016  FINDINGS: CT HEAD FINDINGS Brain: There is moderate age-related atrophy and chronic microvascular ischemic changes. Focal area of hypodensity in the left corona radiata extending from the left lateral ventricle inferiorly (series 201 images 16-20) appears new since the prior CT and may represent stop a subacute and less likely an acute infarct. Associated mild edema. Correlation with clinical exam recommended. MRI is recommended if there is clinical concern for an acute infarct. There is no acute intracranial hemorrhage. No mass effect or midline shift noted. Vascular: No hyperdense vessel or unexpected calcification. Skull: Normal. Negative for fracture or focal lesion. Sinuses/Orbits: There is partial opacification of multiple ethmoid air cells. No air-fluid levels. There is opacification of the mastoid air cells bilaterally. Other: Small right forehead scalp contusion. CT CERVICAL SPINE FINDINGS Alignment: Normal. Skull base and vertebrae: No acute fracture. No primary bone lesion or focal pathologic process. Soft tissues and spinal canal: No prevertebral fluid or swelling. No visible canal hematoma. Disc levels: Multilevel degenerative changes with mild facet hypertrophy. Upper chest: Negative. Other: None IMPRESSION: No acute intracranial hemorrhage. Focal area of hypodensity extending from the lateral aspect of the left lateral ventricle inferiorly appears new since the prior CT and may represent a subacute or less likely an acute infarct.  Correlation with clinical exam recommended. MRI is recommended if there is clinical concern for acute infarct. Moderate age-related atrophy and chronic microvascular ischemic changes. No acute/ traumatic cervical spine pathology. Electronically Signed   By: Elgie Collard M.D.   On: 04/30/2016 03:38   Ct Cervical Spine Wo Contrast  Result Date: 05/01/2016 CLINICAL DATA:  82 year old female with fall and laceration to back of the head. EXAM: CT HEAD WITHOUT CONTRAST CT CERVICAL SPINE WITHOUT CONTRAST TECHNIQUE: Multidetector CT imaging of the head and cervical spine was performed following the standard protocol without intravenous contrast. Multiplanar CT image reconstructions of the cervical spine were also generated. COMPARISON:  Brain MRI dated 04/26/2016 and head CT dated 04/09/2016 FINDINGS: CT HEAD FINDINGS Brain: There is moderate age-related atrophy and chronic microvascular ischemic changes. Focal area of hypodensity in the left corona radiata extending from the left lateral ventricle inferiorly (series 201 images 16-20) appears new since the prior CT and may represent stop a subacute and less likely an acute infarct. Associated mild edema. Correlation with clinical exam recommended. MRI is recommended if there is clinical concern for an acute infarct. There is no acute intracranial hemorrhage. No mass effect or midline shift noted. Vascular: No hyperdense vessel or unexpected calcification. Skull: Normal. Negative for fracture or focal lesion. Sinuses/Orbits: There is partial opacification of multiple ethmoid air cells. No air-fluid levels. There is opacification of the mastoid air cells bilaterally. Other: Small right forehead scalp contusion. CT CERVICAL SPINE FINDINGS Alignment: Normal. Skull base and vertebrae: No acute fracture. No primary bone lesion or focal pathologic process. Soft tissues and spinal canal: No prevertebral fluid or swelling. No visible canal hematoma. Disc levels: Multilevel  degenerative changes with mild facet hypertrophy. Upper chest: Negative. Other: None IMPRESSION: No acute intracranial hemorrhage. Focal area of hypodensity extending from the lateral aspect of the left lateral ventricle inferiorly appears new since the prior CT and may represent a subacute or less likely an acute infarct. Correlation with clinical exam recommended. MRI is recommended if there is clinical concern for acute infarct. Moderate age-related atrophy and chronic microvascular ischemic changes. No acute/ traumatic cervical spine pathology. Electronically Signed   By: Elgie Collard M.D.   On: 04/19/2016 03:38   Mr Maxine Glenn  Head Wo Contrast  Result Date: 05/10/2016 CLINICAL DATA:  Acute left MCA infarct. EXAM: MRA NECK WITHOUT AND WITH CONTRAST MRA HEAD WITHOUT CONTRAST TECHNIQUE: Multiplanar and multiecho pulse sequences of the neck were obtained without and with intravenous contrast. Angiographic images of the neck were obtained using MRA technique without and with intravenous contast.; Angiographic images of the Circle of Willis were obtained using MRA technique without intravenous contrast. CONTRAST:  14mL MULTIHANCE GADOBENATE DIMEGLUMINE 529 MG/ML IV SOLN COMPARISON:  None. FINDINGS: MRA NECK FINDINGS The study is moderately motion degraded mainly in the lower neck/ upper chest. Presumed standard 3 vessel aortic arch branching, though the origin of the left vertebral artery is not established on this study due to artifact. The brachiocephalic and subclavian arteries are grossly patent. However, motion artifact completely obscures the region of the distal brachiocephalic artery and proximal right common carotid artery. Artifact through this level also obscures a portion of the mid left common carotid artery as well as both proximal V1 segments. The portions of the common carotid arteries which are well-visualized are widely patent. The internal carotid arteries are patent bilaterally without evidence  of significant stenosis. The distal right cervical ICA is tortuous. The vertebral arteries are patent and codominant. There is no evidence of flow limiting stenosis in the V2 or V3 segments. There are areas of mild-to-moderate narrowing versus artifact in the V3 segments. The V1 segments are not well evaluated. MRA HEAD FINDINGS The study is mildly motion degraded. The visualized distal vertebral arteries are patent to the basilar and codominant. Right PICA origin is patent. Left PICA is not identified, nor are AICAs. Basilar artery is widely patent. SCA origins are patent. There is a fetal type origin of the left PCA with absent left P1. The PCAs are patent without evidence of significant proximal stenosis. Internal carotid arteries are patent from skullbase to carotid termini without evidence of significant stenosis. ACAs and right MCA are patent without evidence of major branch occlusion or flow limiting proximal stenosis. The left M1 segment is patent with at most mild narrowing distally. There is occlusion of the left M2 inferior division at its origin. There is mild reconstitution of flow and some more distal branches. Flow is present in the left M2 superior division, though it has an attenuated appearance. No intracranial aneurysm is identified. IMPRESSION: 1. Occlusion of the left M2 inferior division. Note that the acute infarct on yesterday's MRI is predominantly in the lateral lenticulostriate territory however. 2. Motion degraded neck MRA with limited assessment of the large vessels in the upper chest and lower neck. No evidence of flow limiting cervical carotid or vertebral artery stenosis within this limitation. Electronically Signed   By: Sebastian Ache M.D.   On: 05/10/2016 08:08   Mr Maxine Glenn Neck W Wo Contrast  Result Date: 05/10/2016 CLINICAL DATA:  Acute left MCA infarct. EXAM: MRA NECK WITHOUT AND WITH CONTRAST MRA HEAD WITHOUT CONTRAST TECHNIQUE: Multiplanar and multiecho pulse sequences of the  neck were obtained without and with intravenous contrast. Angiographic images of the neck were obtained using MRA technique without and with intravenous contast.; Angiographic images of the Circle of Willis were obtained using MRA technique without intravenous contrast. CONTRAST:  14mL MULTIHANCE GADOBENATE DIMEGLUMINE 529 MG/ML IV SOLN COMPARISON:  None. FINDINGS: MRA NECK FINDINGS The study is moderately motion degraded mainly in the lower neck/ upper chest. Presumed standard 3 vessel aortic arch branching, though the origin of the left vertebral artery is not established on this study  due to artifact. The brachiocephalic and subclavian arteries are grossly patent. However, motion artifact completely obscures the region of the distal brachiocephalic artery and proximal right common carotid artery. Artifact through this level also obscures a portion of the mid left common carotid artery as well as both proximal V1 segments. The portions of the common carotid arteries which are well-visualized are widely patent. The internal carotid arteries are patent bilaterally without evidence of significant stenosis. The distal right cervical ICA is tortuous. The vertebral arteries are patent and codominant. There is no evidence of flow limiting stenosis in the V2 or V3 segments. There are areas of mild-to-moderate narrowing versus artifact in the V3 segments. The V1 segments are not well evaluated. MRA HEAD FINDINGS The study is mildly motion degraded. The visualized distal vertebral arteries are patent to the basilar and codominant. Right PICA origin is patent. Left PICA is not identified, nor are AICAs. Basilar artery is widely patent. SCA origins are patent. There is a fetal type origin of the left PCA with absent left P1. The PCAs are patent without evidence of significant proximal stenosis. Internal carotid arteries are patent from skullbase to carotid termini without evidence of significant stenosis. ACAs and right MCA  are patent without evidence of major branch occlusion or flow limiting proximal stenosis. The left M1 segment is patent with at most mild narrowing distally. There is occlusion of the left M2 inferior division at its origin. There is mild reconstitution of flow and some more distal branches. Flow is present in the left M2 superior division, though it has an attenuated appearance. No intracranial aneurysm is identified. IMPRESSION: 1. Occlusion of the left M2 inferior division. Note that the acute infarct on yesterday's MRI is predominantly in the lateral lenticulostriate territory however. 2. Motion degraded neck MRA with limited assessment of the large vessels in the upper chest and lower neck. No evidence of flow limiting cervical carotid or vertebral artery stenosis within this limitation. Electronically Signed   By: Sebastian AcheAllen  Grady M.D.   On: 05/10/2016 08:08   Mr Brain Wo Contrast  Result Date: 04/20/2016 CLINICAL DATA:  Fall with right-sided weakness. EXAM: MRI HEAD WITHOUT CONTRAST TECHNIQUE: Multiplanar, multiecho pulse sequences of the brain and surrounding structures were obtained without intravenous contrast. COMPARISON:  Head CT 05/07/2016 Brain MR 04/26/2016 FINDINGS: Brain: There are areas of diffusion restriction within the left caudate head, extending into the putamen; the left frontal operculum; and of the subinsular white matter. The abnormality is in close proximity to the posterior limb of the internal capsule. There is also a possible focus of diffusion restriction within the left cerebral peduncle. No acute hemorrhage. There is beginning confluent hyperintense T2-weighted signal within the periventricular white matter, most often seen in the setting of chronic microvascular ischemia. No mass lesion or midline shift. No hydrocephalus or extra-axial fluid collection. The midline structures are normal. No age advanced or lobar predominant atrophy. Vascular: Major intracranial arterial and venous  sinus flow voids are preserved. No evidence of chronic microhemorrhage or amyloid angiopathy. Skull and upper cervical spine: The visualized skull base, calvarium, upper cervical spine and extracranial soft tissues are normal. Sinuses/Orbits: Bilateral mastoid effusions. Paranasal sinuses are clear. Normal orbits. IMPRESSION: 1. Acute infarct within the left subinsular white matter, left frontal operculum and left basal ganglia (predominantly the caudate head and putamen). No hemorrhage, midline shift or mass effect. 2. Punctate focus of possible ischemia within the left cerebral peduncle. 3. Chronic microvascular disease. 4. Bilateral mastoid effusions. Electronically Signed  By: Deatra RobinsonKevin  Herman M.D.   On: 05/10/2016 05:39   Mr Brain Wo Contrast  Result Date: 04/26/2016 CLINICAL DATA:  Worsening hearing loss over the past 4-5 days. Stroke last month. EXAM: MRI HEAD WITHOUT CONTRAST TECHNIQUE: Multiplanar, multiecho pulse sequences of the brain and surrounding structures were obtained without intravenous contrast. COMPARISON:  04/10/2016 FINDINGS: Brain: Small volume blood products are again seen in the mesial left temporal lobe extending to the region of inferior basal ganglia/ genu of the left internal capsule. Minimal residual diffusion signal abnormality is present in these locations related to the blood products. Abnormal trace diffusion signal elsewhere in the left greater than right cerebral hemispheres and left cerebellum on the prior study has resolved. There is minimal T2/FLAIR hyperintensity in the mesial left temporal lobe/ basal ganglia region and left temporal occipital region corresponding to the previously demonstrated infarcts. There is no evidence of acute infarct, new intracranial hemorrhage, mass, midline shift, or extra-axial fluid collection. Bilateral periventricular white matter T2 hyperintensities are unchanged and nonspecific but compatible with mild chronic small vessel ischemic  disease. There is moderate cerebral atrophy. Vascular: Major intracranial vascular flow voids are preserved. Skull and upper cervical spine: Unremarkable bone marrow signal. Sinuses/Orbits: Unremarkable orbits. Minimal posterior right ethmoid air cell mucosal thickening. Large bilateral mastoid effusions, increased from prior. Fluid is now present in the middle ear cavities bilaterally. Other: None. IMPRESSION: 1. Residual small volume blood products in the mesial left temporal lobe and left internal capsule/inferior basal ganglia region associated with subacute infarcts demonstrated on the prior MRI. 2. Resolved diffusion abnormality associated with the small infarcts elsewhere. 3. No acute infarct or new intracranial abnormality. 4. Large bilateral mastoid and middle ear effusions. Electronically Signed   By: Sebastian AcheAllen  Grady M.D.   On: 04/26/2016 19:03   Dg Chest Port 1 View  Result Date: 05/15/2016 CLINICAL DATA:  Short of breath. EXAM: PORTABLE CHEST 1 VIEW COMPARISON:  05/10/2016 FINDINGS: Cardiac silhouette is normal in size. No mediastinal or hilar masses. No convincing adenopathy. Linear and reticular opacities are noted at the lung bases, similar to the prior study, consistent with a combination scarring and subsegmental atelectasis. Lung base and perihilar chronic bronchitic changes also stable. There is no evidence of pneumonia or pulmonary edema. No convincing pleural effusion. No pneumothorax. Skeletal structures are demineralized but grossly intact. IMPRESSION: 1. No acute cardiopulmonary disease. 2. Stable appearance from the prior study. Electronically Signed   By: Amie Portlandavid  Ormond M.D.   On: 05/15/2016 17:08   Dg Chest Port 1 View  Result Date: 04/19/2016 CLINICAL DATA:  Shortness of Breath following recent fall EXAM: PORTABLE CHEST 1 VIEW COMPARISON:  04/20/2016 FINDINGS: Cardiac shadow is stable. Bibasilar atelectatic changes are noted slightly more prominent than that seen on the prior exam.  No pneumothorax or sizable effusion is seen. No bony abnormality is noted. IMPRESSION: Mild bibasilar atelectasis. Electronically Signed   By: Alcide CleverMark  Lukens M.D.   On: 05/04/2016 21:56   Dg Chest Port 1 View  Result Date: 04/23/2016 CLINICAL DATA:  Aspiration pneumonia EXAM: PORTABLE CHEST 1 VIEW COMPARISON:  04/23/2016 at 12:20 FINDINGS: Mild streaky opacity in the right days. Left lung is clear. Pulmonary vasculature is normal. No effusions. Hilar, mediastinal and cardiac contours are unremarkable and unchanged. IMPRESSION: Streaky right base opacity. No focal confluent consolidation. No significant change in the interim. Electronically Signed   By: Ellery Plunkaniel R Mitchell M.D.   On: 04/23/2016 23:41   Dg Chest Port 1 View  Result Date: 04/23/2016  CLINICAL DATA:  COPD, aspiration pneumonia. EXAM: PORTABLE CHEST 1 VIEW COMPARISON:  Portable chest x-ray of Sep 20, 2015 FINDINGS: The lungs are mildly hyperinflated. The interstitial markings are coarse. Increased density at the right lung base is consistent with the clinically suspected aspiration pneumonia. The heart and pulmonary vascularity are normal. There is calcification in the wall of the tortuous thoracic aorta. There is no pleural effusion. The bony thorax exhibits no acute abnormality. IMPRESSION: Right basilar pneumonia consistent with aspiration. COPD. Thoracic aortic atherosclerosis. When the patient can tolerate the procedure, a PA and lateral chest x-ray would be useful. Electronically Signed   By: David  [place] M.D.   On: 04/23/2016 12:34   Dg Chest Port 1 View  Result Date: 04/21/2016 CLINICAL DATA:  Initial evaluation for acute respiratory distress, wheezing. History of COPD. EXAM: PORTABLE CHEST 1 VIEW COMPARISON:  Prior radiograph from 04/21/2016. FINDINGS: Cardiac and mediastinal silhouettes are stable in size and contour, and remain within normal limits. Atherosclerotic disease noted within the aortic arch. Lungs are hypoinflated. Changes  related COPD noted. Increased right basilar opacity favored to reflect atelectasis/bronchovascular crowding, although recurrent infiltrate not excluded. Left basilar atelectasis noted. Small left pleural effusion. No pulmonary edema or pneumothorax. No acute osseous abnormality. IMPRESSION: 1. Shallow lung inflation with increased right basilar opacity, favored to reflect atelectasis/ bronchovascular crowding, although recurrent infiltrate could be considered in the correct clinical setting. 2. Small left pleural effusion with associated atelectasis. 3. COPD. Electronically Signed   By: Rise Mu M.D.   On: 04/21/2016 20:38   Dg Chest Port 1 View  Result Date: 04/21/2016 CLINICAL DATA:  Follow-up wheezing, COPD. EXAM: PORTABLE CHEST 1 VIEW COMPARISON:  04/20/2016 FINDINGS: There is hyperinflation of the lungs compatible with COPD. Heart and mediastinal contours are within normal limits. No focal opacities or effusions. No acute bony abnormality. IMPRESSION: COPD.  No active disease. Electronically Signed   By: Charlett Nose M.D.   On: 04/21/2016 08:27   Dg Chest Port 1 View  Result Date: 04/20/2016 CLINICAL DATA:  Pneumonia. EXAM: PORTABLE CHEST 1 VIEW COMPARISON:  04/16/2016. FINDINGS: Mediastinum and hilar structures are normal. Heart size stable. Low lung volumes with basilar atelectasis. Slight improvement of right base infiltrate. A component of right base bronchiectasis cannot be excluded. Tiny left pleural effusion. No pneumothorax. IMPRESSION: 1. Interim partial clearing of right base infiltrate. A component of bronchiectasis in the right base cannot be excluded. 2. Low lung volumes.  Small left pleural effusion noted. Electronically Signed   By: Maisie Fus  Register   On: 04/20/2016 07:20   Dg Chest Port 1v Same Day  Result Date: 05/10/2016 CLINICAL DATA:  Dyspnea, history COPD, stroke EXAM: PORTABLE CHEST 1 VIEW COMPARISON:  Portable exam 0932 hours compared to 05-14-16 FINDINGS: Upper  normal heart size. Rotated to the RIGHT. Mediastinal contours and pulmonary vascularity normal. Atherosclerotic calcification aorta. Bronchitic changes with bibasilar atelectasis. Remaining lungs clear. No pleural effusion or pneumothorax. IMPRESSION: Bronchitic changes with bibasilar atelectasis. Aortic atherosclerosis. Electronically Signed   By: Ulyses Southward M.D.   On: 05/10/2016 09:53   Dg Knee Complete 4 Views Right  Result Date: 14-May-2016 CLINICAL DATA:  82 year old female with fall and right knee swelling. EXAM: RIGHT KNEE - COMPLETE 4+ VIEW COMPARISON:  None. FINDINGS: No acute fracture or dislocation. The bones are osteopenic. There is meniscal chondrocalcinosis with mild osteoarthritic changes of the knees. No joint effusion. The soft tissues appear unremarkable. IMPRESSION: No acute fracture or dislocation. Electronically Signed   By:  Elgie Collard M.D.   On: 04/20/2016 03:04   Dg Swallowing Func-speech Pathology  Result Date: 04/17/2016 Objective Swallowing Evaluation: Type of Study: MBS-Modified Barium Swallow Study Patient Details Name: Latrece Nitta MRN: [id number] Date of Birth: 04/07/1928 Today's Date: 04/17/2016 Time: SLP Start Time (ACUTE ONLY): 1045-SLP Stop Time (ACUTE ONLY): 1115 SLP Time Calculation (min) (ACUTE ONLY): 30 min Past Medical History: Past Medical History: Diagnosis Date . COPD (chronic obstructive pulmonary disease) (HCC)  Past Surgical History: Past Surgical History: Procedure Laterality Date . IR GENERIC HISTORICAL  04/09/2016  IR ANGIO INTRA EXTRACRAN SEL COM CAROTID INNOMINATE UNI R MOD SED 04/09/2016 Julieanne Cotton, MD [facility]INTER[facility] . IR GENERIC HISTORICAL  04/09/2016  IR PERCUTANEOUS ART THROMBECTOMY/INFUSION INTRACRANIAL INC DIAG ANGIO 04/09/2016 Julieanne Cotton, MD [facility] . PELVIC FRACTURE SURGERY   . RADIOLOGY WITH ANESTHESIA N/A 04/09/2016  Procedure: RADIOLOGY WITH ANESTHESIA;  Surgeon: Julieanne Cotton, MD;  Location: [facility] OR;  Service: Radiology;   Laterality: N/A; . TEE WITHOUT CARDIOVERSION N/A 04/13/2016  Procedure: TRANSESOPHAGEAL ECHOCARDIOGRAM (TEE);  Surgeon: Thurmon Fair, MD;  Location: [facility] ENDOSCOPY;  Service: Cardiovascular;  Laterality: N/A; HPI: 82 y.o. right handed female with h/o COPD. Presented 04/10/2016 with sudden onset of right-sided weakness and inability to speak. 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 Pt discharged to CIR 11/28, however, later that night she became dyspneic and diaphoretic. She was given albuterol nebs with some improvement. CXR with RLL infiltrate. Subjective: pt awake, pleasant Assessment / Plan / Recommendation CHL IP CLINICAL IMPRESSIONS 04/17/2016 Therapy Diagnosis Mild oral phase dysphagia;Mild pharyngeal phase dysphagia Clinical Impression Pt's swallow function demonstrates mild improvements since [facility] 11/26.  She presents with piecemeal oral bolusing; persisting delay in initiation (valleculae and pyriforms) -however, this is not necessarily considered disordered in the elderly population.  There may have been questionable, high penetration of liquids, but it was difficult to discern.  No aspiration. Pyriform sinuses appear to be asymmetric and there is mild residue remaining post-swallow. Recommend advancing diet to dysphagia 3, nectar-thick liquids for now.  Will follow for functional toleration/safety.   Impact on safety and function Mild aspiration risk   CHL IP TREATMENT RECOMMENDATION 04/17/2016 Treatment Recommendations Therapy as outlined in treatment plan below   Prognosis 04/17/2016 Prognosis for Safe Diet Advancement Good Barriers to Reach Goals -- Barriers/Prognosis Comment -- CHL IP DIET RECOMMENDATION 04/17/2016 SLP Diet Recommendations Dysphagia 3 (Mech soft) solids;Nectar thick liquid Liquid Administration via Cup Medication Administration Whole meds with puree  Compensations Small sips/bites;Slow rate Postural Changes --   CHL IP OTHER RECOMMENDATIONS 04/17/2016 Recommended Consults -- Oral Care Recommendations Oral care BID Other Recommendations --   CHL IP FOLLOW UP RECOMMENDATIONS 04/17/2016 Follow up Recommendations Inpatient Rehab   CHL IP FREQUENCY AND DURATION 04/17/2016 Speech Therapy Frequency (ACUTE ONLY) min 2x/week Treatment Duration 2 weeks      CHL IP ORAL PHASE 04/17/2016 Oral Phase Impaired Oral - Pudding Teaspoon -- Oral - Pudding Cup -- Oral - Honey Teaspoon -- Oral - Honey Cup -- Oral - Nectar Teaspoon -- Oral - Nectar Cup -- Oral - Nectar Straw -- Oral - Thin Teaspoon -- Oral - Thin Cup -- Oral - Thin Straw -- Oral - Puree Piecemeal swallowing Oral - Mech Soft Piecemeal swallowing Oral - Regular -- Oral - Multi-Consistency -- Oral - Pill -- Oral Phase - Comment --  CHL IP PHARYNGEAL PHASE 04/17/2016  Pharyngeal Phase Impaired Pharyngeal- Pudding Teaspoon -- Pharyngeal -- Pharyngeal- Pudding Cup -- Pharyngeal -- Pharyngeal- Honey Teaspoon -- Pharyngeal -- Pharyngeal- Honey Cup NT Pharyngeal -- Pharyngeal- Nectar Teaspoon -- Pharyngeal -- Pharyngeal- Nectar Cup Pharyngeal residue - pyriform;Delayed swallow initiation-pyriform sinuses Pharyngeal Material enters airway, remains ABOVE vocal cords then ejected out Pharyngeal- Nectar Straw -- Pharyngeal -- Pharyngeal- Thin Teaspoon -- Pharyngeal -- Pharyngeal- Thin Cup Delayed swallow initiation-pyriform sinuses;Pharyngeal residue - pyriform Pharyngeal Material enters airway, remains ABOVE vocal cords then ejected out Pharyngeal- Thin Straw -- Pharyngeal -- Pharyngeal- Puree Delayed swallow initiation-vallecula;Pharyngeal residue - pyriform Pharyngeal -- Pharyngeal- Mechanical Soft Delayed swallow initiation-vallecula;Pharyngeal residue - pyriform Pharyngeal -- Pharyngeal- Regular -- Pharyngeal -- Pharyngeal- Multi-consistency -- Pharyngeal -- Pharyngeal- Pill -- Pharyngeal -- Pharyngeal Comment --  CHL IP CERVICAL  ESOPHAGEAL PHASE 04/12/2016 Cervical Esophageal Phase WFL Pudding Teaspoon -- Pudding Cup -- Honey Teaspoon -- Honey Cup -- Nectar Teaspoon -- Nectar Cup -- Nectar Straw -- Thin Teaspoon -- Thin Cup -- Thin Straw -- Puree -- Mechanical Soft -- Regular -- Multi-consistency -- Pill -- Cervical Esophageal Comment -- No flowsheet data found. Blenda Mounts Laurice 04/17/2016, 11:50 AM               Lab Data:  CBC:  Recent Labs Lab 05/10/16 0853 05/11/16 0202 05/12/16 0255 05/13/16 0334 05/16/16 0517  WBC 11.6* 11.8* 12.9* 10.9* 9.9  HGB 11.7* 11.4* 11.7* 10.9* 11.4*  HCT 36.6 35.8* 36.5 34.9* 36.5  MCV 85.9 86.1 86.3 87.0 87.3  PLT 220 218 215 217 309   Basic Metabolic Panel:  Recent Labs Lab 05/11/16 0202 05/12/16 0255 05/13/16 0334 05/15/16 2013 05/16/16 0517  NA 139 139 142 142 146*  K 3.4* 3.3* 3.6 3.2* 3.3*  CL 99* 103 105 105 107  CO2 29 28 25 22 26   GLUCOSE 109* 129* 148* 108* 107*  BUN 18 14 17  21* 18  CREATININE 0.85 0.75 0.70 0.70 0.67  CALCIUM 7.5* 7.4* 7.1* 7.5* 7.3*  MG  --   --   --  2.0 2.4   GFR: Estimated Creatinine Clearance: 47.8 mL/min (by C-G formula based on SCr of 0.67 mg/dL). Liver Function Tests:  Recent Labs Lab 05/16/16 0517  AST 20  ALT 13*  ALKPHOS 54  BILITOT 0.7  PROT 6.9  ALBUMIN 2.5*   No results for input(s): LIPASE, AMYLASE in the last 168 hours.  Recent Labs Lab 05/10/16 0853  AMMONIA 15   Coagulation Profile: No results for input(s): INR, PROTIME in the last 168 hours. Cardiac Enzymes: No results for input(s): CKTOTAL, CKMB, CKMBINDEX, TROPONINI in the last 168 hours. BNP (last 3 results) No results for input(s): PROBNP in the last 8760 hours. HbA1C: No results for input(s): HGBA1C in the last 72 hours. CBG:  Recent Labs Lab 05/10/16 1722  GLUCAP 83   Lipid Profile: No results for input(s): CHOL, HDL, LDLCALC, TRIG, CHOLHDL, LDLDIRECT in the last 72 hours. Thyroid Function Tests: No results for input(s):  TSH, T4TOTAL, FREET4, T3FREE, THYROIDAB in the last 72 hours. Anemia Panel: No results for input(s): VITAMINB12, FOLATE, FERRITIN, TIBC, IRON, RETICCTPCT in the last 72 hours. Urine analysis:    Component Value Date/Time   COLORURINE YELLOW 05-30-2016 0419   APPEARANCEUR HAZY (A) 2016/05/30 0419   LABSPEC 1.020 May 30, 2016 0419   PHURINE 5.0 05/30/16 0419   GLUCOSEU NEGATIVE 05/30/16 0419   HGBUR NEGATIVE May 30, 2016 0419   BILIRUBINUR NEGATIVE 30-May-2016 0419   KETONESUR NEGATIVE May 30, 2016 0419   PROTEINUR NEGATIVE  05-28-16 0419   NITRITE NEGATIVE May 28, 2016 0419   LEUKOCYTESUR NEGATIVE 2016/05/28 0419     Ermie Glendenning M.D. PhD [facility] 05/16/2016, 8:17 AM   Between 7am to 7pm - call Pager - [phone]  After 7pm go to [email or web address] - password [email or web address]  Call night coverage person covering after 7pm

## 2016-05-16 NOTE — Progress Notes (Signed)
Pt removed neb treatment mask midway through the treatment & and shook her head no to continuing the treatment.  RN notified.

## 2016-05-17 DIAGNOSIS — I5023 Acute on chronic systolic (congestive) heart failure: Secondary | ICD-10-CM

## 2016-05-17 LAB — BASIC METABOLIC PANEL
Anion gap: 8 (ref 5–15)
BUN: 23 mg/dL — AB (ref 6–20)
CHLORIDE: 108 mmol/L (ref 101–111)
CO2: 28 mmol/L (ref 22–32)
CREATININE: 0.55 mg/dL (ref 0.44–1.00)
Calcium: 7.5 mg/dL — ABNORMAL LOW (ref 8.9–10.3)
GFR calc Af Amer: >60 mL/min (ref >60)
Glucose, Bld: 157 mg/dL — ABNORMAL HIGH (ref 65–99)
POTASSIUM: 4.2 mmol/L (ref 3.5–5.1)
SODIUM: 144 mmol/L (ref 135–145)

## 2016-05-17 LAB — TSH: TSH: 1.201 u[IU]/mL (ref 0.350–4.500)

## 2016-05-17 MED ORDER — GUAIFENESIN ER 600 MG PO TB12
600.0000 mg | ORAL_TABLET | Freq: Two times a day (BID) | ORAL | Status: DC
  Administered 2016-05-17 – 2016-05-21 (×9): 600 mg via ORAL
  Filled 2016-05-17 (×9): qty 1

## 2016-05-17 MED ORDER — CARVEDILOL 6.25 MG PO TABS
6.2500 mg | ORAL_TABLET | Freq: Two times a day (BID) | ORAL | Status: DC
  Administered 2016-05-17 – 2016-05-18 (×3): 6.25 mg via ORAL
  Filled 2016-05-17 (×3): qty 1

## 2016-05-17 NOTE — Progress Notes (Signed)
[facility]                                                                              Patient Demographics  Catherine Day, is a 82 y.o. female, DOB - 1927/08/14, ZOX:[id number]  Admit date - 2016-06-06   Admitting Physician Haydee Salter, MD  Outpatient Primary MD for the patient is No primary care provider on file.  Outpatient specialists:   LOS - 8  days    Chief Complaint  Patient presents with  . Fall       Brief summary   Patient is a 82 year old female with recent protracted hospitalization for left-sided embolic stroke, COPD, O2 dependent, prior hospitalization course complicated by ESBL positive Escherichia coli UTI and aspiration pneumonia. She was discharged to skilled nursing facility.   Patient was sent to the ER after a fall. She was last seen normal earlier in the evening without specific time given. Per ER notes the patient fell about 11:30 PM and at that time it was discovered that the patient was not moving the right side as per previous baseline. MRI showed acute infarcts within the left subinsular white matter, left frontal operculum the left basal ganglia with an area of punctate possible ischemia in the left cerebral peduncle.   Assessment & Plan    Acute CVA in the setting of Recent Embolic stroke (HCC) - B MCA and L cerebellar/Cerebrovascular accident (CVA) due to bilateral thrombosis of vertebral arteries  -Patient recently admitted for acute stroke requiring tPA as well as complete revascularization of occluded left MCA, left ACA and left ICA terminus in interventional radiology. Baseline deficits prior to this hospitalization included continued right lower extremity weakness with associated gait disturbance and mild to moderate right upper extremity weakness. -Patient now admitted with right-sided hemiparesis, flaccid with findings of new left side multiple area strokes on MRI, also has expressive aphasia - MRA of the brain showed  occlusion of the left M2 division, acute infarct on MRIs predominantly in the lateral lenticular striate territory.  -Underwent TEE previous admission with no embolic source found; no indication to repeat echocardiogram this admission -Full stroke evaluation last admission so no indication to repeat carotid duplex, hemoglobin A1c or lipid panel this admission -- she initially failed swallow eval, not interactive, she was on npo , getting rectal asa, Repeat CT head showed evolving left MCA distribution subacute infarct, initially patient did not show any meaningful clinical improvement, palliative care consulted, and comfort care was considered - she showed drastic improvement on 12/28, smiling, interactive and follow command,  Passed swallow eval with dysphagia diet (puree, honey thick liquid), continue ivf for another 24hrs then d/c if consistent oral intake. Neurology consulted and signed off on 12/28 -still drowsy most of the day, with inconsistent  oral intake, may need to discuss feeding tube if no improvement over the weekend    Acute encephalopathy with hypoxic respiratory failure, aspiration pneumonia, dysphagia, aspirating -- IV Zosyn started on 12/24, stopped on 12/29 - started to improved from 12/28, but mental status still fluctuating, concerns about silent aspiration, continue abx but changed to oral augmentin, strict aspiration precaution, speech  following daily, family agreed to feeding tube if needed -drowsy, poor oral intake, may need to discuss feeding tube if no improvement over the weekend      Chronic combined systolic and diastolic CHF, NYHA class 1  --TEE previous admission revealed EF of 45-50% with grade 1 diastolic dysfunction -home lasix held initially due to dry, npo, she was on ivf -bp start to increase, has wheezing on 12/29, one dose iv lasix, d/c ivf, change iv abx to oral , stat cxr no acute findings on 12/29  NSVT: will check tsh, keep k>4, mag>2, start low  dose coreg and titrate up   Hypertension -Allow for permissive hypertension initially, gradually restart home bp meds diovan, low dose coreg started on 12/30 due to NSVT -iv lasix on 12/29 -12/31bp elevated,  increase coreg   chronic hypoxic respiratory failure on home o2 2liter / Chronic obstructive pulmonary disease with chronic respiratory failure - on DuoNeb's -wheezing persistently continue nebs ,abx, add steroids    Hyperlipidemia -Holding Lipitor while NPO  FTT: palliative care input appreciated   Code Status: DNR DVT Prophylaxis:  Lovenox  Family Communication: patient    Disposition Plan:   May need feeding tube placement,  Time Spent in minutes   35 minutes  Procedures:  MRI brain MRA brain   Consultants:   Neurology  Palliative care  Antimicrobials:   IV Zosyn 12/24 to 12/29  augmentin from 12/29   Medications  Scheduled Meds: .  stroke: mapping our early stages of recovery book   Does not apply Once  . amoxicillin-clavulanate  1 tablet Oral Q12H  . aspirin  300 mg Rectal Daily   Or  . aspirin  325 mg Oral Daily  . carvedilol  6.25 mg Oral BID WC  . enoxaparin (LOVENOX) injection  40 mg Subcutaneous Q24H  . fluticasone furoate-vilanterol  1 puff Inhalation Daily  . ipratropium-albuterol  3 mL Nebulization QID  . methylPREDNISolone (SOLU-MEDROL) injection  60 mg Intravenous Q8H  . nystatin   Topical TID   Continuous Infusions:  PRN Meds:.acetaminophen **OR** acetaminophen (TYLENOL) oral liquid 160 mg/5 mL **OR** acetaminophen, glycopyrrolate, ipratropium-albuterol, senna-docusate   Antibiotics   Anti-infectives    Start     Dose/Rate Route Frequency Ordered Stop   05/15/16 2200  amoxicillin-clavulanate (AUGMENTIN) 875-125 MG per tablet 1 tablet     1 tablet Oral Every 12 hours 05/15/16 1634     05/10/16 0900  piperacillin-tazobactam (ZOSYN) IVPB 3.375 g  Status:  Discontinued     3.375 g 12.5 mL/hr over 240 Minutes Intravenous Every 8  hours 05/10/16 0859 05/15/16 1633        Subjective:   Hughie ClossRegina Gaffey was seen and examined today. she seems to be drowsy in the morning, start to be more alert around noon time, still has inconsistent oral intake less audible wheezing, bp elevated, had a few runs on asymptomatic NSVT   Objective:   Vitals:   05/16/16 1640 05/16/16 1945 05/16/16 2322 05/17/16 0418  BP: (!) 144/60 (!) 145/66 135/66 (!) 181/93  Pulse: 70 80 64 63  Resp: 16 (!) 22 17 17   Temp:  97.4 F (36.3 C) 98 F (36.7 C) 97.7 F (36.5 C)  TempSrc:  Axillary Axillary Oral  SpO2: 96% 97% 98% 97%  Weight:      Height:        Intake/Output Summary (Last 24 hours) at 05/17/16 0750 Last data filed at 05/17/16 0421  Gross per 24 hour  Intake  390 ml  Output                0 ml  Net              390 ml     Wt Readings from Last 3 Encounters:  04/26/2016 74.8 kg (165 lb)  04/27/16 74.1 kg (163 lb 5.8 oz)  04/14/16 91.2 kg (201 lb)     Exam  General: drowsy, open eyes to voice  Neck: Supple, no JVD  Cardiovascular: S1 S2 clear, RRR  Respiratory:  Less Diffuse bilateral Wheezing,  no rhonchi  Gastrointestinal: Soft, nontender, nondistended, + bowel sounds  Ext: trace pitting edema is improving  Neuro:  right hemiparesis  Skin: No rashes  Psych: drowsy   Data Reviewed:  I have personally reviewed following labs and imaging studies  Micro Results Recent Results (from the past 240 hour(s))  MRSA PCR Screening     Status: Abnormal   Collection Time: 05/10/16  3:51 PM  Result Value Ref Range Status   MRSA by PCR POSITIVE (A) NEGATIVE Final    Comment:        The GeneXpert MRSA Assay (FDA approved for NASAL specimens only), is one component of a comprehensive MRSA colonization surveillance program. It is not intended to diagnose MRSA infection nor to guide or monitor treatment for MRSA infections. RESULT CALLED TO, READ BACK BY AND VERIFIED WITH: Fayrene Fearing RN AT 1610  05/10/16 BY [facility]     Radiology Reports Dg Chest 2 View  Result Date: 04/29/2016 CLINICAL DATA:  82 year old female with fall.  History of COPD. EXAM: CHEST  2 VIEW COMPARISON:  Chest radiograph dated 06/25/2015 FINDINGS: There is shallow inspiration. Bibasilar streaky densities most compatible with atelectatic changes/ scarring. Infiltrate is less likely. No pleural effusion or pneumothorax. Stable cardiac silhouette. The aorta is tortuous. There is osteopenia with degenerative changes of the spine. No acute osseous pathology. IMPRESSION: Shallow inspiration with bibasilar atelectatic changes. Infiltrate is less likely. Electronically Signed   By: Elgie Collard M.D.   On: 05/16/2016 03:05   Ct Head Wo Contrast  Result Date: 05/10/2016 CLINICAL DATA:  Acute encephalopathy. Recent left MCA distribution infarct. EXAM: CT HEAD WITHOUT CONTRAST TECHNIQUE: Contiguous axial images were obtained from the base of the skull through the vertex without intravenous contrast. COMPARISON:  MRI of 04/22/2016.  CT 05/11/2016. FINDINGS: Brain: Expected cerebral volume loss for age. Moderate low density in the periventricular white matter likely related to small vessel disease. Evolution of left MCA distribution infarct, slightly more well-defined today. Examples in the lateral left basal ganglia, subinsular region, and extending into the periventricular white matter of the left frontal lobe. No significant mass effect or midline shift. No complicating hemorrhage, hydrocephalus, intra-axial, or extra-axial fluid collection. Vascular: Intracranial carotid atherosclerosis. Skull: Normal Sinuses/Orbits: Normal orbits and globes. Minimal ethmoid air cell mucosal thickening. Chronic bilateral mastoid effusions are unchanged. Other: None IMPRESSION: 1. Evolving left MCA distribution subacute infarct. 2. Chronic mastoid fluid bilaterally. Electronically Signed   By: Jeronimo Greaves M.D.   On: 05/10/2016 17:56   Ct Head Wo  Contrast  Result Date: 05/17/2016 CLINICAL DATA:  82 year old female with fall and laceration to back of the head. EXAM: CT HEAD WITHOUT CONTRAST CT CERVICAL SPINE WITHOUT CONTRAST TECHNIQUE: Multidetector CT imaging of the head and cervical spine was performed following the standard protocol without intravenous contrast. Multiplanar CT image reconstructions of the cervical spine were also generated. COMPARISON:  Brain MRI dated 04/26/2016 and head  CT dated 04/09/2016 FINDINGS: CT HEAD FINDINGS Brain: There is moderate age-related atrophy and chronic microvascular ischemic changes. Focal area of hypodensity in the left corona radiata extending from the left lateral ventricle inferiorly (series 201 images 16-20) appears new since the prior CT and may represent stop a subacute and less likely an acute infarct. Associated mild edema. Correlation with clinical exam recommended. MRI is recommended if there is clinical concern for an acute infarct. There is no acute intracranial hemorrhage. No mass effect or midline shift noted. Vascular: No hyperdense vessel or unexpected calcification. Skull: Normal. Negative for fracture or focal lesion. Sinuses/Orbits: There is partial opacification of multiple ethmoid air cells. No air-fluid levels. There is opacification of the mastoid air cells bilaterally. Other: Small right forehead scalp contusion. CT CERVICAL SPINE FINDINGS Alignment: Normal. Skull base and vertebrae: No acute fracture. No primary bone lesion or focal pathologic process. Soft tissues and spinal canal: No prevertebral fluid or swelling. No visible canal hematoma. Disc levels: Multilevel degenerative changes with mild facet hypertrophy. Upper chest: Negative. Other: None IMPRESSION: No acute intracranial hemorrhage. Focal area of hypodensity extending from the lateral aspect of the left lateral ventricle inferiorly appears new since the prior CT and may represent a subacute or less likely an acute infarct.  Correlation with clinical exam recommended. MRI is recommended if there is clinical concern for acute infarct. Moderate age-related atrophy and chronic microvascular ischemic changes. No acute/ traumatic cervical spine pathology. Electronically Signed   By: Elgie Collard M.D.   On: June 06, 2016 03:38   Ct Cervical Spine Wo Contrast  Result Date: June 06, 2016 CLINICAL DATA:  82 year old female with fall and laceration to back of the head. EXAM: CT HEAD WITHOUT CONTRAST CT CERVICAL SPINE WITHOUT CONTRAST TECHNIQUE: Multidetector CT imaging of the head and cervical spine was performed following the standard protocol without intravenous contrast. Multiplanar CT image reconstructions of the cervical spine were also generated. COMPARISON:  Brain MRI dated 04/26/2016 and head CT dated 04/09/2016 FINDINGS: CT HEAD FINDINGS Brain: There is moderate age-related atrophy and chronic microvascular ischemic changes. Focal area of hypodensity in the left corona radiata extending from the left lateral ventricle inferiorly (series 201 images 16-20) appears new since the prior CT and may represent stop a subacute and less likely an acute infarct. Associated mild edema. Correlation with clinical exam recommended. MRI is recommended if there is clinical concern for an acute infarct. There is no acute intracranial hemorrhage. No mass effect or midline shift noted. Vascular: No hyperdense vessel or unexpected calcification. Skull: Normal. Negative for fracture or focal lesion. Sinuses/Orbits: There is partial opacification of multiple ethmoid air cells. No air-fluid levels. There is opacification of the mastoid air cells bilaterally. Other: Small right forehead scalp contusion. CT CERVICAL SPINE FINDINGS Alignment: Normal. Skull base and vertebrae: No acute fracture. No primary bone lesion or focal pathologic process. Soft tissues and spinal canal: No prevertebral fluid or swelling. No visible canal hematoma. Disc levels: Multilevel  degenerative changes with mild facet hypertrophy. Upper chest: Negative. Other: None IMPRESSION: No acute intracranial hemorrhage. Focal area of hypodensity extending from the lateral aspect of the left lateral ventricle inferiorly appears new since the prior CT and may represent a subacute or less likely an acute infarct. Correlation with clinical exam recommended. MRI is recommended if there is clinical concern for acute infarct. Moderate age-related atrophy and chronic microvascular ischemic changes. No acute/ traumatic cervical spine pathology. Electronically Signed   By: Elgie Collard M.D.   On: Jun 06, 2016 03:38  Mr Maxine Glenn Head Wo Contrast  Result Date: 05/10/2016 CLINICAL DATA:  Acute left MCA infarct. EXAM: MRA NECK WITHOUT AND WITH CONTRAST MRA HEAD WITHOUT CONTRAST TECHNIQUE: Multiplanar and multiecho pulse sequences of the neck were obtained without and with intravenous contrast. Angiographic images of the neck were obtained using MRA technique without and with intravenous contast.; Angiographic images of the Circle of Willis were obtained using MRA technique without intravenous contrast. CONTRAST:  14mL MULTIHANCE GADOBENATE DIMEGLUMINE 529 MG/ML IV SOLN COMPARISON:  None. FINDINGS: MRA NECK FINDINGS The study is moderately motion degraded mainly in the lower neck/ upper chest. Presumed standard 3 vessel aortic arch branching, though the origin of the left vertebral artery is not established on this study due to artifact. The brachiocephalic and subclavian arteries are grossly patent. However, motion artifact completely obscures the region of the distal brachiocephalic artery and proximal right common carotid artery. Artifact through this level also obscures a portion of the mid left common carotid artery as well as both proximal V1 segments. The portions of the common carotid arteries which are well-visualized are widely patent. The internal carotid arteries are patent bilaterally without evidence  of significant stenosis. The distal right cervical ICA is tortuous. The vertebral arteries are patent and codominant. There is no evidence of flow limiting stenosis in the V2 or V3 segments. There are areas of mild-to-moderate narrowing versus artifact in the V3 segments. The V1 segments are not well evaluated. MRA HEAD FINDINGS The study is mildly motion degraded. The visualized distal vertebral arteries are patent to the basilar and codominant. Right PICA origin is patent. Left PICA is not identified, nor are AICAs. Basilar artery is widely patent. SCA origins are patent. There is a fetal type origin of the left PCA with absent left P1. The PCAs are patent without evidence of significant proximal stenosis. Internal carotid arteries are patent from skullbase to carotid termini without evidence of significant stenosis. ACAs and right MCA are patent without evidence of major branch occlusion or flow limiting proximal stenosis. The left M1 segment is patent with at most mild narrowing distally. There is occlusion of the left M2 inferior division at its origin. There is mild reconstitution of flow and some more distal branches. Flow is present in the left M2 superior division, though it has an attenuated appearance. No intracranial aneurysm is identified. IMPRESSION: 1. Occlusion of the left M2 inferior division. Note that the acute infarct on yesterday's MRI is predominantly in the lateral lenticulostriate territory however. 2. Motion degraded neck MRA with limited assessment of the large vessels in the upper chest and lower neck. No evidence of flow limiting cervical carotid or vertebral artery stenosis within this limitation. Electronically Signed   By: Sebastian Ache M.D.   On: 05/10/2016 08:08   Mr Maxine Glenn Neck W Wo Contrast  Result Date: 05/10/2016 CLINICAL DATA:  Acute left MCA infarct. EXAM: MRA NECK WITHOUT AND WITH CONTRAST MRA HEAD WITHOUT CONTRAST TECHNIQUE: Multiplanar and multiecho pulse sequences of the  neck were obtained without and with intravenous contrast. Angiographic images of the neck were obtained using MRA technique without and with intravenous contast.; Angiographic images of the Circle of Willis were obtained using MRA technique without intravenous contrast. CONTRAST:  14mL MULTIHANCE GADOBENATE DIMEGLUMINE 529 MG/ML IV SOLN COMPARISON:  None. FINDINGS: MRA NECK FINDINGS The study is moderately motion degraded mainly in the lower neck/ upper chest. Presumed standard 3 vessel aortic arch branching, though the origin of the left vertebral artery is not established on  this study due to artifact. The brachiocephalic and subclavian arteries are grossly patent. However, motion artifact completely obscures the region of the distal brachiocephalic artery and proximal right common carotid artery. Artifact through this level also obscures a portion of the mid left common carotid artery as well as both proximal V1 segments. The portions of the common carotid arteries which are well-visualized are widely patent. The internal carotid arteries are patent bilaterally without evidence of significant stenosis. The distal right cervical ICA is tortuous. The vertebral arteries are patent and codominant. There is no evidence of flow limiting stenosis in the V2 or V3 segments. There are areas of mild-to-moderate narrowing versus artifact in the V3 segments. The V1 segments are not well evaluated. MRA HEAD FINDINGS The study is mildly motion degraded. The visualized distal vertebral arteries are patent to the basilar and codominant. Right PICA origin is patent. Left PICA is not identified, nor are AICAs. Basilar artery is widely patent. SCA origins are patent. There is a fetal type origin of the left PCA with absent left P1. The PCAs are patent without evidence of significant proximal stenosis. Internal carotid arteries are patent from skullbase to carotid termini without evidence of significant stenosis. ACAs and right MCA  are patent without evidence of major branch occlusion or flow limiting proximal stenosis. The left M1 segment is patent with at most mild narrowing distally. There is occlusion of the left M2 inferior division at its origin. There is mild reconstitution of flow and some more distal branches. Flow is present in the left M2 superior division, though it has an attenuated appearance. No intracranial aneurysm is identified. IMPRESSION: 1. Occlusion of the left M2 inferior division. Note that the acute infarct on yesterday's MRI is predominantly in the lateral lenticulostriate territory however. 2. Motion degraded neck MRA with limited assessment of the large vessels in the upper chest and lower neck. No evidence of flow limiting cervical carotid or vertebral artery stenosis within this limitation. Electronically Signed   By: Sebastian Ache M.D.   On: 05/10/2016 08:08   Mr Brain Wo Contrast  Result Date: 2016/06/03 CLINICAL DATA:  Fall with right-sided weakness. EXAM: MRI HEAD WITHOUT CONTRAST TECHNIQUE: Multiplanar, multiecho pulse sequences of the brain and surrounding structures were obtained without intravenous contrast. COMPARISON:  Head CT 06/03/16 Brain MR 04/26/2016 FINDINGS: Brain: There are areas of diffusion restriction within the left caudate head, extending into the putamen; the left frontal operculum; and of the subinsular white matter. The abnormality is in close proximity to the posterior limb of the internal capsule. There is also a possible focus of diffusion restriction within the left cerebral peduncle. No acute hemorrhage. There is beginning confluent hyperintense T2-weighted signal within the periventricular white matter, most often seen in the setting of chronic microvascular ischemia. No mass lesion or midline shift. No hydrocephalus or extra-axial fluid collection. The midline structures are normal. No age advanced or lobar predominant atrophy. Vascular: Major intracranial arterial and venous  sinus flow voids are preserved. No evidence of chronic microhemorrhage or amyloid angiopathy. Skull and upper cervical spine: The visualized skull base, calvarium, upper cervical spine and extracranial soft tissues are normal. Sinuses/Orbits: Bilateral mastoid effusions. Paranasal sinuses are clear. Normal orbits. IMPRESSION: 1. Acute infarct within the left subinsular white matter, left frontal operculum and left basal ganglia (predominantly the caudate head and putamen). No hemorrhage, midline shift or mass effect. 2. Punctate focus of possible ischemia within the left cerebral peduncle. 3. Chronic microvascular disease. 4. Bilateral mastoid effusions.  Electronically Signed   By: Deatra Robinson M.D.   On: 04/24/2016 05:39   Mr Brain Wo Contrast  Result Date: 04/26/2016 CLINICAL DATA:  Worsening hearing loss over the past 4-5 days. Stroke last month. EXAM: MRI HEAD WITHOUT CONTRAST TECHNIQUE: Multiplanar, multiecho pulse sequences of the brain and surrounding structures were obtained without intravenous contrast. COMPARISON:  04/10/2016 FINDINGS: Brain: Small volume blood products are again seen in the mesial left temporal lobe extending to the region of inferior basal ganglia/ genu of the left internal capsule. Minimal residual diffusion signal abnormality is present in these locations related to the blood products. Abnormal trace diffusion signal elsewhere in the left greater than right cerebral hemispheres and left cerebellum on the prior study has resolved. There is minimal T2/FLAIR hyperintensity in the mesial left temporal lobe/ basal ganglia region and left temporal occipital region corresponding to the previously demonstrated infarcts. There is no evidence of acute infarct, new intracranial hemorrhage, mass, midline shift, or extra-axial fluid collection. Bilateral periventricular white matter T2 hyperintensities are unchanged and nonspecific but compatible with mild chronic small vessel ischemic  disease. There is moderate cerebral atrophy. Vascular: Major intracranial vascular flow voids are preserved. Skull and upper cervical spine: Unremarkable bone marrow signal. Sinuses/Orbits: Unremarkable orbits. Minimal posterior right ethmoid air cell mucosal thickening. Large bilateral mastoid effusions, increased from prior. Fluid is now present in the middle ear cavities bilaterally. Other: None. IMPRESSION: 1. Residual small volume blood products in the mesial left temporal lobe and left internal capsule/inferior basal ganglia region associated with subacute infarcts demonstrated on the prior MRI. 2. Resolved diffusion abnormality associated with the small infarcts elsewhere. 3. No acute infarct or new intracranial abnormality. 4. Large bilateral mastoid and middle ear effusions. Electronically Signed   By: Sebastian Ache M.D.   On: 04/26/2016 19:03   Dg Chest Port 1 View  Result Date: 05/15/2016 CLINICAL DATA:  Short of breath. EXAM: PORTABLE CHEST 1 VIEW COMPARISON:  05/10/2016 FINDINGS: Cardiac silhouette is normal in size. No mediastinal or hilar masses. No convincing adenopathy. Linear and reticular opacities are noted at the lung bases, similar to the prior study, consistent with a combination scarring and subsegmental atelectasis. Lung base and perihilar chronic bronchitic changes also stable. There is no evidence of pneumonia or pulmonary edema. No convincing pleural effusion. No pneumothorax. Skeletal structures are demineralized but grossly intact. IMPRESSION: 1. No acute cardiopulmonary disease. 2. Stable appearance from the prior study. Electronically Signed   By: Amie Portland M.D.   On: 05/15/2016 17:08   Dg Chest Port 1 View  Result Date: 05/16/2016 CLINICAL DATA:  Shortness of Breath following recent fall EXAM: PORTABLE CHEST 1 VIEW COMPARISON:  04/25/2016 FINDINGS: Cardiac shadow is stable. Bibasilar atelectatic changes are noted slightly more prominent than that seen on the prior exam.  No pneumothorax or sizable effusion is seen. No bony abnormality is noted. IMPRESSION: Mild bibasilar atelectasis. Electronically Signed   By: Alcide Clever M.D.   On: 05/10/2016 21:56   Dg Chest Port 1 View  Result Date: 04/23/2016 CLINICAL DATA:  Aspiration pneumonia EXAM: PORTABLE CHEST 1 VIEW COMPARISON:  04/23/2016 at 12:20 FINDINGS: Mild streaky opacity in the right days. Left lung is clear. Pulmonary vasculature is normal. No effusions. Hilar, mediastinal and cardiac contours are unremarkable and unchanged. IMPRESSION: Streaky right base opacity. No focal confluent consolidation. No significant change in the interim. Electronically Signed   By: Ellery Plunk M.D.   On: 04/23/2016 23:41   Dg Chest [facility] 1 [address]  Result Date: 04/23/2016 CLINICAL DATA:  COPD, aspiration pneumonia. EXAM: PORTABLE CHEST 1 VIEW COMPARISON:  Portable chest x-ray of Sep 20, 2015 FINDINGS: The lungs are mildly hyperinflated. The interstitial markings are coarse. Increased density at the right lung base is consistent with the clinically suspected aspiration pneumonia. The heart and pulmonary vascularity are normal. There is calcification in the wall of the tortuous thoracic aorta. There is no pleural effusion. The bony thorax exhibits no acute abnormality. IMPRESSION: Right basilar pneumonia consistent with aspiration. COPD. Thoracic aortic atherosclerosis. When the patient can tolerate the procedure, a PA and lateral chest x-ray would be useful. Electronically Signed   By: David  [place] M.D.   On: 04/23/2016 12:34   Dg Chest Port 1 View  Result Date: 04/21/2016 CLINICAL DATA:  Initial evaluation for acute respiratory distress, wheezing. History of COPD. EXAM: PORTABLE CHEST 1 VIEW COMPARISON:  Prior radiograph from 04/21/2016. FINDINGS: Cardiac and mediastinal silhouettes are stable in size and contour, and remain within normal limits. Atherosclerotic disease noted within the aortic arch. Lungs are hypoinflated. Changes  related COPD noted. Increased right basilar opacity favored to reflect atelectasis/bronchovascular crowding, although recurrent infiltrate not excluded. Left basilar atelectasis noted. Small left pleural effusion. No pulmonary edema or pneumothorax. No acute osseous abnormality. IMPRESSION: 1. Shallow lung inflation with increased right basilar opacity, favored to reflect atelectasis/ bronchovascular crowding, although recurrent infiltrate could be considered in the correct clinical setting. 2. Small left pleural effusion with associated atelectasis. 3. COPD. Electronically Signed   By: Rise Mu M.D.   On: 04/21/2016 20:38   Dg Chest Port 1 View  Result Date: 04/21/2016 CLINICAL DATA:  Follow-up wheezing, COPD. EXAM: PORTABLE CHEST 1 VIEW COMPARISON:  04/20/2016 FINDINGS: There is hyperinflation of the lungs compatible with COPD. Heart and mediastinal contours are within normal limits. No focal opacities or effusions. No acute bony abnormality. IMPRESSION: COPD.  No active disease. Electronically Signed   By: Charlett Nose M.D.   On: 04/21/2016 08:27   Dg Chest Port 1 View  Result Date: 04/20/2016 CLINICAL DATA:  Pneumonia. EXAM: PORTABLE CHEST 1 VIEW COMPARISON:  04/16/2016. FINDINGS: Mediastinum and hilar structures are normal. Heart size stable. Low lung volumes with basilar atelectasis. Slight improvement of right base infiltrate. A component of right base bronchiectasis cannot be excluded. Tiny left pleural effusion. No pneumothorax. IMPRESSION: 1. Interim partial clearing of right base infiltrate. A component of bronchiectasis in the right base cannot be excluded. 2. Low lung volumes.  Small left pleural effusion noted. Electronically Signed   By: Maisie Fus  Register   On: 04/20/2016 07:20   Dg Chest Port 1v Same Day  Result Date: 05/10/2016 CLINICAL DATA:  Dyspnea, history COPD, stroke EXAM: PORTABLE CHEST 1 VIEW COMPARISON:  Portable exam 0932 hours compared to 04/18/2016 FINDINGS: Upper  normal heart size. Rotated to the RIGHT. Mediastinal contours and pulmonary vascularity normal. Atherosclerotic calcification aorta. Bronchitic changes with bibasilar atelectasis. Remaining lungs clear. No pleural effusion or pneumothorax. IMPRESSION: Bronchitic changes with bibasilar atelectasis. Aortic atherosclerosis. Electronically Signed   By: Ulyses Southward M.D.   On: 05/10/2016 09:53   Dg Knee Complete 4 Views Right  Result Date: 04/26/2016 CLINICAL DATA:  82 year old female with fall and right knee swelling. EXAM: RIGHT KNEE - COMPLETE 4+ VIEW COMPARISON:  None. FINDINGS: No acute fracture or dislocation. The bones are osteopenic. There is meniscal chondrocalcinosis with mild osteoarthritic changes of the knees. No joint effusion. The soft tissues appear unremarkable. IMPRESSION: No acute fracture or dislocation. Electronically Signed  By: Elgie Collard M.D.   On: 04/26/2016 03:04   Dg Swallowing Func-speech Pathology  Result Date: 04/17/2016 Objective Swallowing Evaluation: Type of Study: MBS-Modified Barium Swallow Study Patient Details Name: Renna Kilmer MRN: [id number] Date of Birth: [birth year] Today's Date: 04/17/2016 Time: SLP Start Time (ACUTE ONLY): 1045-SLP Stop Time (ACUTE ONLY): 1115 SLP Time Calculation (min) (ACUTE ONLY): 30 min Past Medical History: Past Medical History: Diagnosis Date . COPD (chronic obstructive pulmonary disease) (HCC)  Past Surgical History: Past Surgical History: Procedure Laterality Date . IR GENERIC HISTORICAL  04/09/2016  IR ANGIO INTRA EXTRACRAN SEL COM CAROTID INNOMINATE UNI R MOD SED 04/09/2016 Julieanne Cotton, MD [facility]-INTER[facility] RAD . IR GENERIC HISTORICAL  04/09/2016  IR PERCUTANEOUS ART THROMBECTOMY/INFUSION INTRACRANIAL INC DIAG ANGIO 04/09/2016 Julieanne Cotton, MD [facility] . PELVIC FRACTURE SURGERY   . RADIOLOGY WITH ANESTHESIA N/A 04/09/2016  Procedure: RADIOLOGY WITH ANESTHESIA;  Surgeon: Julieanne Cotton, MD;  Location: [facility] OR;  Service: Radiology;   Laterality: N/A; . TEE WITHOUT CARDIOVERSION N/A 04/13/2016  Procedure: TRANSESOPHAGEAL ECHOCARDIOGRAM (TEE);  Surgeon: Thurmon Fair, MD;  Location: [facility] ENDOSCOPY;  Service: Cardiovascular;  Laterality: N/A; HPI: 82 y.o. right handed female with h/o COPD. Presented 04/10/2016 with sudden onset of right-sided weakness and inability to speak. 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 Pt discharged to CIR 11/28, however, later that night she became dyspneic and diaphoretic. She was given albuterol nebs with some improvement. CXR with RLL infiltrate. Subjective: pt awake, pleasant Assessment / Plan / Recommendation CHL IP CLINICAL IMPRESSIONS 04/17/2016 Therapy Diagnosis Mild oral phase dysphagia;Mild pharyngeal phase dysphagia Clinical Impression Pt's swallow function demonstrates mild improvements since [facility] 11/26.  She presents with piecemeal oral bolusing; persisting delay in initiation (valleculae and pyriforms) -however, this is not necessarily considered disordered in the elderly population.  There may have been questionable, high penetration of liquids, but it was difficult to discern.  No aspiration. Pyriform sinuses appear to be asymmetric and there is mild residue remaining post-swallow. Recommend advancing diet to dysphagia 3, nectar-thick liquids for now.  Will follow for functional toleration/safety.   Impact on safety and function Mild aspiration risk   CHL IP TREATMENT RECOMMENDATION 04/17/2016 Treatment Recommendations Therapy as outlined in treatment plan below   Prognosis 04/17/2016 Prognosis for Safe Diet Advancement Good Barriers to Reach Goals -- Barriers/Prognosis Comment -- CHL IP DIET RECOMMENDATION 04/17/2016 SLP Diet Recommendations Dysphagia 3 (Mech soft) solids;Nectar thick liquid Liquid Administration via Cup Medication Administration Whole meds with puree  Compensations Small sips/bites;Slow rate Postural Changes --   CHL IP OTHER RECOMMENDATIONS 04/17/2016 Recommended Consults -- Oral Care Recommendations Oral care BID Other Recommendations --   CHL IP FOLLOW UP RECOMMENDATIONS 04/17/2016 Follow up Recommendations Inpatient Rehab   CHL IP FREQUENCY AND DURATION 04/17/2016 Speech Therapy Frequency (ACUTE ONLY) min 2x/week Treatment Duration 2 weeks      CHL IP ORAL PHASE 04/17/2016 Oral Phase Impaired Oral - Pudding Teaspoon -- Oral - Pudding Cup -- Oral - Honey Teaspoon -- Oral - Honey Cup -- Oral - Nectar Teaspoon -- Oral - Nectar Cup -- Oral - Nectar Straw -- Oral - Thin Teaspoon -- Oral - Thin Cup -- Oral - Thin Straw -- Oral - Puree Piecemeal swallowing Oral - Mech Soft Piecemeal swallowing Oral - Regular -- Oral - Multi-Consistency -- Oral - Pill -- Oral Phase - Comment --  CHL IP PHARYNGEAL PHASE  04/17/2016 Pharyngeal Phase Impaired Pharyngeal- Pudding Teaspoon -- Pharyngeal -- Pharyngeal- Pudding Cup -- Pharyngeal -- Pharyngeal- Honey Teaspoon -- Pharyngeal -- Pharyngeal- Honey Cup NT Pharyngeal -- Pharyngeal- Nectar Teaspoon -- Pharyngeal -- Pharyngeal- Nectar Cup Pharyngeal residue - pyriform;Delayed swallow initiation-pyriform sinuses Pharyngeal Material enters airway, remains ABOVE vocal cords then ejected out Pharyngeal- Nectar Straw -- Pharyngeal -- Pharyngeal- Thin Teaspoon -- Pharyngeal -- Pharyngeal- Thin Cup Delayed swallow initiation-pyriform sinuses;Pharyngeal residue - pyriform Pharyngeal Material enters airway, remains ABOVE vocal cords then ejected out Pharyngeal- Thin Straw -- Pharyngeal -- Pharyngeal- Puree Delayed swallow initiation-vallecula;Pharyngeal residue - pyriform Pharyngeal -- Pharyngeal- Mechanical Soft Delayed swallow initiation-vallecula;Pharyngeal residue - pyriform Pharyngeal -- Pharyngeal- Regular -- Pharyngeal -- Pharyngeal- Multi-consistency -- Pharyngeal -- Pharyngeal- Pill -- Pharyngeal -- Pharyngeal Comment --  CHL IP CERVICAL  ESOPHAGEAL PHASE 04/12/2016 Cervical Esophageal Phase WFL Pudding Teaspoon -- Pudding Cup -- Honey Teaspoon -- Honey Cup -- Nectar Teaspoon -- Nectar Cup -- Nectar Straw -- Thin Teaspoon -- Thin Cup -- Thin Straw -- Puree -- Mechanical Soft -- Regular -- Multi-consistency -- Pill -- Cervical Esophageal Comment -- No flowsheet data found. [name], Amanda Laurice 04/17/2016, 11:50 AM               Lab Data:  CBC:  Recent Labs Lab 05/10/16 0853 05/11/16 0202 05/12/16 0255 05/13/16 0334 05/16/16 0517  WBC 11.6* 11.8* 12.9* 10.9* 9.9  HGB 11.7* 11.4* 11.7* 10.9* 11.4*  HCT 36.6 35.8* 36.5 34.9* 36.5  MCV 85.9 86.1 86.3 87.0 87.3  PLT 220 218 215 217 309   Basic Metabolic Panel:  Recent Labs Lab 05/11/16 0202 05/12/16 0255 05/13/16 0334 05/15/16 2013 05/16/16 0517  NA 139 139 142 142 146*  K 3.4* 3.3* 3.6 3.2* 3.3*  CL 99* 103 105 105 107  CO2 29 28 25 22 26   GLUCOSE 109* 129* 148* 108* 107*  BUN 18 14 17  21* 18  CREATININE 0.85 0.75 0.70 0.70 0.67  CALCIUM 7.5* 7.4* 7.1* 7.5* 7.3*  MG  --   --   --  2.0 2.4   GFR: Estimated Creatinine Clearance: 47.8 mL/min (by C-G formula based on SCr of 0.67 mg/dL). Liver Function Tests:  Recent Labs Lab 05/16/16 0517  AST 20  ALT 13*  ALKPHOS 54  BILITOT 0.7  PROT 6.9  ALBUMIN 2.5*   No results for input(s): LIPASE, AMYLASE in the last 168 hours.  Recent Labs Lab 05/10/16 0853  AMMONIA 15   Coagulation Profile: No results for input(s): INR, PROTIME in the last 168 hours. Cardiac Enzymes: No results for input(s): CKTOTAL, CKMB, CKMBINDEX, TROPONINI in the last 168 hours. BNP (last 3 results) No results for input(s): PROBNP in the last 8760 hours. HbA1C: No results for input(s): HGBA1C in the last 72 hours. CBG:  Recent Labs Lab 05/10/16 1722  GLUCAP 83   Lipid Profile: No results for input(s): CHOL, HDL, LDLCALC, TRIG, CHOLHDL, LDLDIRECT in the last 72 hours. Thyroid Function Tests: No results for input(s):  TSH, T4TOTAL, FREET4, T3FREE, THYROIDAB in the last 72 hours. Anemia Panel: No results for input(s): VITAMINB12, FOLATE, FERRITIN, TIBC, IRON, RETICCTPCT in the last 72 hours. Urine analysis:    Component Value Date/Time   COLORURINE YELLOW 05/17/2016 0419   APPEARANCEUR HAZY (A) 05/08/2016 0419   LABSPEC 1.020 05/07/2016 0419   PHURINE 5.0 04/24/2016 0419   GLUCOSEU NEGATIVE 05/05/2016 0419   HGBUR NEGATIVE 05/12/2016 0419   BILIRUBINUR NEGATIVE 04/27/2016 0419   KETONESUR NEGATIVE 05/16/2016 0419   PROTEINUR  NEGATIVE 05/04/2016 0419   NITRITE NEGATIVE 04/23/2016 0419   LEUKOCYTESUR NEGATIVE 05/02/2016 0419     Isaias Dowson M.D. PhD [facility] 05/17/2016, 7:50 AM   Between 7am to 7pm - call Pager - [phone]  After 7pm go to [email or web address] - password [email or web address]  Call night coverage person covering after 7pm

## 2016-05-18 ENCOUNTER — Inpatient Hospital Stay (HOSPITAL_COMMUNITY): Payer: Medicare Other

## 2016-05-18 LAB — BASIC METABOLIC PANEL
Anion gap: 10 (ref 5–15)
BUN: 32 mg/dL — AB (ref 6–20)
CO2: 27 mmol/L (ref 22–32)
CREATININE: 0.6 mg/dL (ref 0.44–1.00)
Calcium: 7.9 mg/dL — ABNORMAL LOW (ref 8.9–10.3)
Chloride: 109 mmol/L (ref 101–111)
GFR calc Af Amer: >60 mL/min (ref >60)
Glucose, Bld: 160 mg/dL — ABNORMAL HIGH (ref 65–99)
Potassium: 4.3 mmol/L (ref 3.5–5.1)
SODIUM: 146 mmol/L — AB (ref 135–145)

## 2016-05-18 LAB — BLOOD GAS, ARTERIAL
ACID-BASE EXCESS: 4.2 mmol/L — AB (ref 0.0–2.0)
BICARBONATE: 28.5 mmol/L — AB (ref 20.0–28.0)
Drawn by: 257881
O2 CONTENT: 3 L/min
O2 SAT: 98.5 %
PCO2 ART: 45.4 mmHg (ref 32.0–48.0)
Patient temperature: 98.6
pH, Arterial: 7.414 (ref 7.350–7.450)
pO2, Arterial: 126 mmHg — ABNORMAL HIGH (ref 83.0–108.0)

## 2016-05-18 LAB — MAGNESIUM: MAGNESIUM: 2.9 mg/dL — AB (ref 1.7–2.4)

## 2016-05-18 MED ORDER — HYDRALAZINE HCL 20 MG/ML IJ SOLN
10.0000 mg | Freq: Four times a day (QID) | INTRAMUSCULAR | Status: DC | PRN
  Administered 2016-05-19: 10 mg via INTRAVENOUS
  Filled 2016-05-18: qty 1

## 2016-05-18 MED ORDER — METHYLPREDNISOLONE SODIUM SUCC 125 MG IJ SOLR
60.0000 mg | Freq: Two times a day (BID) | INTRAMUSCULAR | Status: DC
  Administered 2016-05-18 – 2016-05-21 (×7): 60 mg via INTRAVENOUS
  Filled 2016-05-18 (×7): qty 2

## 2016-05-18 MED ORDER — DIPHENHYDRAMINE HCL 50 MG/ML IJ SOLN
12.5000 mg | Freq: Once | INTRAMUSCULAR | Status: AC
  Administered 2016-05-18: 12.5 mg via INTRAVENOUS
  Filled 2016-05-18: qty 1

## 2016-05-18 MED ORDER — CARVEDILOL 12.5 MG PO TABS
12.5000 mg | ORAL_TABLET | Freq: Two times a day (BID) | ORAL | Status: DC
  Administered 2016-05-18 – 2016-05-20 (×4): 12.5 mg via ORAL
  Filled 2016-05-18 (×4): qty 1

## 2016-05-18 NOTE — Progress Notes (Addendum)
Daily Progress Note   Patient Name: Catherine Day       Date: 05/18/2016 DOB: [birth year]  Age: 83 y.o. MRN#: [id number] Attending Physician: Albertine Grates, MD Primary Care Physician: No primary care provider on file. Admit Date: May 28, 2016  Reason for Consultation/Follow-up: Establishing goals of care  Subjective: Catherine Day was resting comfortably in bed on my arrival to her room. She did wake with gentle touch. She was able to consistently nod/shake her head to simple questions, however she did not verbalize any words today. Over the weekend she continued to demonstrate intermittent periods of wakefulness and lethargy. Her intake has been variable as well. Her care nurse reported that, when she can be fully woken up, she consistently eats the majority of her food. That said, she usually only eats 2 meals per day, and requires max assistance with eating. Otherwise, she denies any acute concerns. She continues to have significant wheezing and audible rhonchi with breathing, though she does not appear to be in any acute distress or concerned about her breathing (I asked multiple times if she was uncomfortable and.if she was comfortable, and she consistently nodded 'no' to uncomfortably and 'yes' to comfortable).   Length of Stay: 9  Current Medications: Scheduled Meds:  .  stroke: mapping our early stages of recovery book   Does not apply Once  . amoxicillin-clavulanate  1 tablet Oral Q12H  . aspirin  300 mg Rectal Daily   Or  . aspirin  325 mg Oral Daily  . carvedilol  6.25 mg Oral BID WC  . enoxaparin (LOVENOX) injection  40 mg Subcutaneous Q24H  . fluticasone furoate-vilanterol  1 puff Inhalation Daily  . guaiFENesin  600 mg Oral BID  . ipratropium-albuterol  3 mL Nebulization QID  .  methylPREDNISolone (SOLU-MEDROL) injection  60 mg Intravenous Q8H  . nystatin   Topical TID    Continuous Infusions:   PRN Meds: acetaminophen **OR** acetaminophen (TYLENOL) oral liquid 160 mg/5 mL **OR** acetaminophen, glycopyrrolate, ipratropium-albuterol, senna-docusate  Physical Exam  Constitutional: Vital signs are normal. She appears lethargic. She appears ill. No distress. Nasal cannula in place.  HENT:  Head: Normocephalic and atraumatic.  Eyes: EOM are normal.  Neck: Normal range of motion.  Cardiovascular: Normal rate, regular rhythm and normal heart sounds.   Pulmonary/Chest: No accessory muscle usage. No  tachypnea. No respiratory distress. She has wheezes (audible, bilateral upper fields). She has rhonchi (audible) in the right upper field and the left upper field.  Abdominal: Soft. Normal appearance.  Neurological: She appears lethargic.  No words spoken during my visit. Does mirror simple movements: LUE full movement, LLE can wiggle toes and flex ankle, RUE can move fingers and hand laterally but not against gravity, RLE could not elicit any movement.  Skin: Skin is warm and dry. Bruising noted. There is pallor.  BLE cool. Slight mottling on toes bilaterally, unchanged from days prior. Dependent edema noted in RUE and RLE.  Psychiatric:  Pt lethargic, however appears calm.   Nursing note and vitals reviewed.          Vital Signs: BP (!) 163/72   Pulse 69   Temp 97.6 F (36.4 C) (Oral)   Resp 18   Ht 5\' 2"  (1.575 m)   Wt 74.8 kg (165 lb)   SpO2 96%   BMI 30.18 kg/m  SpO2: SpO2: 96 % O2 Device: O2 Device: Jet Vent O2 Flow Rate: O2 Flow Rate (L/min): 3 L/min  Intake/output summary:   Intake/Output Summary (Last 24 hours) at 05/18/16 0914 Last data filed at 05/18/16 0900  Gross per 24 hour  Intake              363 ml  Output                0 ml  Net              363 ml   LBM: Last BM Date: 05/15/16 Baseline Weight: Weight: 73.9 kg (163 lb) Most recent  weight: Weight: 74.8 kg (165 lb)       Palliative Assessment/Data: PPS 30%  Flowsheet Rows   Flowsheet Row Most Recent Value  Intake Tab  Referral Department  Hospitalist  Unit at Time of Referral  Intermediate Care Unit  Palliative Care Primary Diagnosis  Neurology  Palliative Care Type  New Palliative care  Reason for referral  Clarify Goals of Care  Date of Admission  05/10/2016  Date first seen by Palliative Care  05/11/16  Clinical Assessment  Palliative Performance Scale Score  10%  Psychosocial & Spiritual Assessment  Palliative Care Outcomes  Patient/Family meeting held?  Yes  Who was at the meeting?  son  Palliative Care Outcomes  Clarified goals of care, Provided end of life care assistance, Provided psychosocial or spiritual support, Counseled regarding hospice      Patient Active Problem List   Diagnosis Date Noted  . Palliative care encounter   . Goals of care, counseling/discussion   . At high risk for aspiration   . Cerebrovascular accident (CVA) (HCC)   . Acute encephalopathy 04/23/2016  . UTI due to extended-spectrum beta lactamase (ESBL) producing Escherichia coli   . Chronic combined systolic and diastolic CHF, NYHA class 1 (HCC)   . Cerebrovascular accident (CVA) due to bilateral thrombosis of vertebral arteries (HCC)   . Hypertension 04/14/2016  . Hyperlipidemia 04/14/2016  . 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 Dysphagia 04/14/2016  . Chronic obstructive pulmonary disease (HCC)   . Hypokalemia   . Recent Embolic stroke (HCC) - B MCA and L cerebellar     Palliative Care Assessment & Plan   Patient Profile: 83 y.o. female  with past medical history of COPD on home oxygen and recent stroke in November admitted on 05/12/2016 with fall and worsened right sided deficits. MRI showed  infarcts in subcortical left hemisphere. MRA with occlusion of the left M2 division. Repeat CT showed evolving left MCA distribution subacute infarct.  Patient failed speech swallow evaluation on 12/24. Of note, she did have a recent hospitalization for stroke requiring tPA and complete revascularization of occluded left MCA, left ACA, and left ICA in interventional radiology. That prior hospitalization was complicated by ESBL positive UTI and aspiration pneumonia.   Assessment: Catherine Day seems to wax and wane in alertness. On 12/28 she had a dramatic shift from days prior and was awake, alert, and very interactive. She was cleared for oral intake on a dysphagia 1 with honey thick liquids after MBS. Since being cleared her oral intake has been highly variable, and very dependent on how awake she is. When awake, she eats the majority of her meal (usually only eats ~2 meals per day, per care nurse). I doubt her intake is adequate given her highly fluctuating alertness.   I have called and left a message for her son. He had previously expressed that he would want her to have a feeding tube if needed, however we need to discuss her current presentation. She has the capacity to safely eat, however is limited in her intake by her mentation/level of alertness. I am not sure if this would change things for him. In these situations when a person has the ability to eat but doesn't due to mentation (that I believe will likely not dramatically improve), I advise against feeding tube placement. I will need to discuss this with him further when he returns my call.   ADDENDUM (1035): I was able to connect with Luisa Hart and addressed her intake. He explained that she ate all of her lunch, but had minimal food or fluid the rest of the day. I explained my concerns that his mother had inadequate PO intake based on both his and the care nurse reports.  We discussed the options moving forward, which included continuing to encourage eating and drinking vs placing feeding tube to ensure adequate intake. He wanted to discuss this with his sister further. In the meantime, he wanted  an evaluation of her intake to determine what her intake needs are, and how close she is to meeting them.  Recommendations/Plan:  DNR/DNI; continue full scope treatment otherwise  Consult for dietician placed, and calorie count ordered. Given the holiday, I would expect the dietician to be available tomorrow for assessment.   Plan to contact [facility] tomorrow after he has spoken with his sister. If intake adequate can proceed with discharge to SNF. If inadequate, I will talk with Luisa Hart about his preferences for management.  For symptoms: would continue steroids and targeted pulmonary interventions (O2 and duo-nebs) for marked wheeze   Goals of Care and Additional Recommendations:  Limitations on Scope of Treatment: Full Scope Treatment-except DNR/DNI  Code Status: DNR   Code Status Orders        Start     Ordered   04/27/2016 0909  Do not attempt resuscitation (DNR)  Continuous    Question Answer Comment  In the event of cardiac or respiratory ARREST Do not call a "code blue"   In the event of cardiac or respiratory ARREST Do not perform Intubation, CPR, defibrillation or ACLS   In the event of cardiac or respiratory ARREST Use medication by any route, position, wound care, and other measures to relive pain and suffering. May use oxygen, suction and manual treatment of airway obstruction as needed for comfort.  05/11/2016 0908    Code Status History    Date Active Date Inactive Code Status Order ID Comments User Context   05/08/2016  8:40 AM 04/28/2016  9:08 AM Full Code [id number]  Russella Dar, NP Inpatient   04/28/2016  8:18 AM 04/17/2016  8:40 AM DNR [id number]  Russella Dar, NP ED   04/24/2016  4:55 PM 04/27/2016  7:50 PM DNR [id number]  Catarina Hartshorn, MD Inpatient   04/15/2016 12:51 AM 04/24/2016  4:54 PM Full Code [id number]  Duayne Cal, NP Inpatient   04/14/2016  2:58 PM 04/14/2016  2:58 PM Full Code [id number]  Charlton Amor, PA-C Inpatient   04/14/2016  2:58 PM  04/15/2016 12:51 AM Full Code [id number]  Charlton Amor, PA-C Inpatient   04/09/2016 11:12 PM 04/10/2016  9:15 AM Full Code [id number]  Julieanne Cotton, MD Inpatient   04/09/2016 11:12 PM 04/09/2016 11:12 PM Full Code [id number]  Rejeana Brock, MD Inpatient    Advance Directive Documentation   Flowsheet Row Most Recent Value  Type of Advance Directive  Out of facility DNR (pink MOST or yellow form), Healthcare Power of Attorney  Pre-existing out of facility DNR order (yellow form or pink MOST form)  Yellow form placed in chart (order not valid for inpatient use)  "MOST" Form in Place?  No data       Prognosis:   Unable to determine  Discharge Planning:  Skilled Nursing Facility for rehab with Palliative care service follow-up  Care plan was discussed with pt's son.  Thank you for allowing the Palliative Medicine Team to assist in the care of this patient.   Time In: 0915 Time Out: 0930 Total Time 15 minutes Prolonged Time Billed  no       Greater than 50%  of this time was spent counseling and coordinating care related to the above assessment and plan.  Murrell Converse AGNP-C Palliative Medicine Team     Please contact Palliative Medicine Team phone at [phone] for questions and concerns.

## 2016-05-18 NOTE — Progress Notes (Signed)
Rt had to deep SX Pt 2 times. RT didn't get but a small amount of secretions both times. The Pt sounds very wet. Vitals were good. RT will continue to monitor.

## 2016-05-18 NOTE — Progress Notes (Addendum)
[facility]                                                                              Patient Demographics  Catherine Day, is a 83 y.o. female, DOB - 05/13/1928, ZOX:[id number]  Admit date - 06/08/16   Admitting Physician Haydee Salter, MD  Outpatient Primary MD for the patient is No primary care provider on file.  Outpatient specialists:   LOS - 9  days    Chief Complaint  Patient presents with  . Fall       Brief summary   Patient is a 83 year old female with recent protracted hospitalization for left-sided embolic stroke, COPD, O2 dependent, prior hospitalization course complicated by ESBL positive Escherichia coli UTI and aspiration pneumonia. She was discharged to skilled nursing facility.   Patient was sent to the ER after a fall. She was last seen normal earlier in the evening without specific time given. Per ER notes the patient fell about 11:30 PM and at that time it was discovered that the patient was not moving the right side as per previous baseline. MRI showed acute infarcts within the left subinsular white matter, left frontal operculum the left basal ganglia with an area of punctate possible ischemia in the left cerebral peduncle.   Assessment & Plan    Acute CVA in the setting of Recent Embolic stroke (HCC) - B MCA and L cerebellar/Cerebrovascular accident (CVA) due to bilateral thrombosis of vertebral arteries  -Patient recently admitted for acute stroke requiring tPA as well as complete revascularization of occluded left MCA, left ACA and left ICA terminus in interventional radiology. Baseline deficits prior to this hospitalization included continued right lower extremity weakness with associated gait disturbance and mild to moderate right upper extremity weakness. -Patient now admitted with right-sided hemiparesis, flaccid with findings of new left side multiple area strokes on MRI, also has expressive aphasia - MRA of the brain showed  occlusion of the left M2 division, acute infarct on MRIs predominantly in the lateral lenticular striate territory.  -Underwent TEE previous admission with no embolic source found; no indication to repeat echocardiogram this admission -Full stroke evaluation last admission so no indication to repeat carotid duplex, hemoglobin A1c or lipid panel this admission -- she initially failed swallow eval, not interactive, she was on npo , getting rectal asa, Repeat CT head showed evolving left MCA distribution subacute infarct, initially patient did not show any meaningful clinical improvement, palliative care consulted, and comfort care was considered - she showed drastic improvement on 12/28, smiling, interactive and follow command,  Passed swallow eval with dysphagia diet (puree, honey thick liquid), continue ivf for another 24hrs then d/c if consistent oral intake. Neurology consulted and signed off on 12/28 -still drowsy most of the day, with inconsistent  oral intake, may need to discuss feeding tube if no improvement over the weekend Nutrition consulted for Calorie counting    Acute encephalopathy with hypoxic respiratory failure, aspiration pneumonia, dysphagia, aspirating -- IV Zosyn started on 12/24, stopped on 12/29 - started to improved from 12/28, but mental status still fluctuating, concerns about silent aspiration, continue abx but changed to oral  augmentin, strict aspiration precaution, speech following daily, family agreed to feeding tube if needed -drowsy, poor oral intake, may need to discuss feeding tube if no improvement over the weekend      Chronic combined systolic and diastolic CHF, NYHA class 1  --TEE previous admission revealed EF of 45-50% with grade 1 diastolic dysfunction -home lasix held initially due to dry, npo, she was on ivf -bp start to increase, has wheezing on 12/29, one dose iv lasix, d/c ivf, change iv abx to oral , stat cxr no acute findings on  12/29 -inconsistence oral intake, remain off ivf, hold lasix  NSVT: will check tsh, keep k>4, mag>2, started coreg and titrate up   Hypertension -Allow for permissive hypertension initially, gradually restart home bp meds diovan, low dose coreg started on 12/30 due to NSVT -iv lasix on 12/29 -12/31bp elevated,  increase coreg   chronic hypoxic respiratory failure on home o2 2liter / Chronic obstructive pulmonary disease with chronic respiratory failure - on DuoNeb's -wheezing persistently , continue nebs ,abx, add steroids Taper steroids    Hyperlipidemia -Holding Lipitor while NPO  FTT: palliative care input appreciated    4pm on 05/18/2016 Addendum: I came to evaluate patient the second time today, she continued to have significant gurgling,  Per RN , son patrick came and fed patient lunch,( patient ate 100% lunch) I am concerned about continued aspiration, stat cxr ordered,  I called son patrick but not able to reach him.  5:40pm RN paged to report that patient is restless, no hypoxia, cxr obtained earlier no hypoxia, will get abg stat, trial dose of benadryl 12.5mg  x1  Code Status: DNR DVT Prophylaxis:  Lovenox  Family Communication: patient    Disposition Plan:   May need feeding tube placement,  Time Spent in minutes   35 minutes  Procedures:  MRI brain MRA brain   Consultants:   Neurology  Palliative care  Antimicrobials:   IV Zosyn 12/24 to 12/29  augmentin from 12/29   Medications  Scheduled Meds: .  stroke: mapping our early stages of recovery book   Does not apply Once  . amoxicillin-clavulanate  1 tablet Oral Q12H  . aspirin  300 mg Rectal Daily   Or  . aspirin  325 mg Oral Daily  . carvedilol  6.25 mg Oral BID WC  . enoxaparin (LOVENOX) injection  40 mg Subcutaneous Q24H  . fluticasone furoate-vilanterol  1 puff Inhalation Daily  . guaiFENesin  600 mg Oral BID  . ipratropium-albuterol  3 mL Nebulization QID  . methylPREDNISolone  (SOLU-MEDROL) injection  60 mg Intravenous Q8H  . nystatin   Topical TID   Continuous Infusions:  PRN Meds:.acetaminophen **OR** acetaminophen (TYLENOL) oral liquid 160 mg/5 mL **OR** acetaminophen, glycopyrrolate, ipratropium-albuterol, senna-docusate   Antibiotics   Anti-infectives    Start     Dose/Rate Route Frequency Ordered Stop   05/15/16 2200  amoxicillin-clavulanate (AUGMENTIN) 875-125 MG per tablet 1 tablet     1 tablet Oral Every 12 hours 05/15/16 1634     05/10/16 0900  piperacillin-tazobactam (ZOSYN) IVPB 3.375 g  Status:  Discontinued     3.375 g 12.5 mL/hr over 240 Minutes Intravenous Every 8 hours 05/10/16 0859 05/15/16 1633        Subjective:   Catherine Day was seen and examined today. No significant improvement in the last few days, she remains to be drowsy most of the day, she only is able to be awake enough for one meal a day for  the last three days audible gurgling this am, bp elevated,   Objective:   Vitals:   05/18/16 0301 05/18/16 0738 05/18/16 0744 05/18/16 0813  BP: (!) 149/60 (!) 169/77  (!) 163/72  Pulse: 71 68  69  Resp: 16 18    Temp: 98.1 F (36.7 C) 97.6 F (36.4 C)    TempSrc: Oral Oral    SpO2: 95% 94% 96%   Weight:      Height:        Intake/Output Summary (Last 24 hours) at 05/18/16 0841 Last data filed at 05/18/16 0300  Gross per 24 hour  Intake              123 ml  Output                0 ml  Net              123 ml     Wt Readings from Last 3 Encounters:  05/28/2016 74.8 kg (165 lb)  04/27/16 74.1 kg (163 lb 5.8 oz)  04/14/16 91.2 kg (201 lb)     Exam  General: drowsy, open eyes to voice, audible gurgling  Neck: Supple, no JVD  Cardiovascular: S1 S2 clear, RRR  Respiratory:  Audible gurgling  Gastrointestinal: Soft, nontender, nondistended, + bowel sounds  Ext: trace pitting edema lower extremity  has resolved  Neuro:  right hemiparesis  Skin: No rashes  Psych: drowsy   Data Reviewed:  I have  personally reviewed following labs and imaging studies  Micro Results Recent Results (from the past 240 hour(s))  MRSA PCR Screening     Status: Abnormal   Collection Time: 05/10/16  3:51 PM  Result Value Ref Range Status   MRSA by PCR POSITIVE (A) NEGATIVE Final    Comment:        The GeneXpert MRSA Assay (FDA approved for NASAL specimens only), is one component of a comprehensive MRSA colonization surveillance program. It is not intended to diagnose MRSA infection nor to guide or monitor treatment for MRSA infections. RESULT CALLED TO, READ BACK BY AND VERIFIED WITH: Fayrene Fearing RN AT 1610 05/10/16 BY [facility]     Radiology Reports Dg Chest 2 View  Result Date: 2016/05/28 CLINICAL DATA:  83 year old female with fall.  History of COPD. EXAM: CHEST  2 VIEW COMPARISON:  Chest radiograph dated 06/25/2015 FINDINGS: There is shallow inspiration. Bibasilar streaky densities most compatible with atelectatic changes/ scarring. Infiltrate is less likely. No pleural effusion or pneumothorax. Stable cardiac silhouette. The aorta is tortuous. There is osteopenia with degenerative changes of the spine. No acute osseous pathology. IMPRESSION: Shallow inspiration with bibasilar atelectatic changes. Infiltrate is less likely. Electronically Signed   By: Elgie Collard M.D.   On: 05-28-2016 03:05   Ct Head Wo Contrast  Result Date: 05/10/2016 CLINICAL DATA:  Acute encephalopathy. Recent left MCA distribution infarct. EXAM: CT HEAD WITHOUT CONTRAST TECHNIQUE: Contiguous axial images were obtained from the base of the skull through the vertex without intravenous contrast. COMPARISON:  MRI of 05-28-2016.  CT May 28, 2016. FINDINGS: Brain: Expected cerebral volume loss for age. Moderate low density in the periventricular white matter likely related to small vessel disease. Evolution of left MCA distribution infarct, slightly more well-defined today. Examples in the lateral left basal ganglia, subinsular  region, and extending into the periventricular white matter of the left frontal lobe. No significant mass effect or midline shift. No complicating hemorrhage, hydrocephalus, intra-axial, or extra-axial fluid collection. Vascular: Intracranial carotid atherosclerosis.  Skull: Normal Sinuses/Orbits: Normal orbits and globes. Minimal ethmoid air cell mucosal thickening. Chronic bilateral mastoid effusions are unchanged. Other: None IMPRESSION: 1. Evolving left MCA distribution subacute infarct. 2. Chronic mastoid fluid bilaterally. Electronically Signed   By: Jeronimo Greaves M.D.   On: 05/10/2016 17:56   Ct Head Wo Contrast  Result Date: 04/23/2016 CLINICAL DATA:  83 year old female with fall and laceration to back of the head. EXAM: CT HEAD WITHOUT CONTRAST CT CERVICAL SPINE WITHOUT CONTRAST TECHNIQUE: Multidetector CT imaging of the head and cervical spine was performed following the standard protocol without intravenous contrast. Multiplanar CT image reconstructions of the cervical spine were also generated. COMPARISON:  Brain MRI dated 04/26/2016 and head CT dated 04/09/2016 FINDINGS: CT HEAD FINDINGS Brain: There is moderate age-related atrophy and chronic microvascular ischemic changes. Focal area of hypodensity in the left corona radiata extending from the left lateral ventricle inferiorly (series 201 images 16-20) appears new since the prior CT and may represent stop a subacute and less likely an acute infarct. Associated mild edema. Correlation with clinical exam recommended. MRI is recommended if there is clinical concern for an acute infarct. There is no acute intracranial hemorrhage. No mass effect or midline shift noted. Vascular: No hyperdense vessel or unexpected calcification. Skull: Normal. Negative for fracture or focal lesion. Sinuses/Orbits: There is partial opacification of multiple ethmoid air cells. No air-fluid levels. There is opacification of the mastoid air cells bilaterally. Other: Small  right forehead scalp contusion. CT CERVICAL SPINE FINDINGS Alignment: Normal. Skull base and vertebrae: No acute fracture. No primary bone lesion or focal pathologic process. Soft tissues and spinal canal: No prevertebral fluid or swelling. No visible canal hematoma. Disc levels: Multilevel degenerative changes with mild facet hypertrophy. Upper chest: Negative. Other: None IMPRESSION: No acute intracranial hemorrhage. Focal area of hypodensity extending from the lateral aspect of the left lateral ventricle inferiorly appears new since the prior CT and may represent a subacute or less likely an acute infarct. Correlation with clinical exam recommended. MRI is recommended if there is clinical concern for acute infarct. Moderate age-related atrophy and chronic microvascular ischemic changes. No acute/ traumatic cervical spine pathology. Electronically Signed   By: Elgie Collard M.D.   On: 05/01/2016 03:38   Ct Cervical Spine Wo Contrast  Result Date: 05/16/2016 CLINICAL DATA:  83 year old female with fall and laceration to back of the head. EXAM: CT HEAD WITHOUT CONTRAST CT CERVICAL SPINE WITHOUT CONTRAST TECHNIQUE: Multidetector CT imaging of the head and cervical spine was performed following the standard protocol without intravenous contrast. Multiplanar CT image reconstructions of the cervical spine were also generated. COMPARISON:  Brain MRI dated 04/26/2016 and head CT dated 04/09/2016 FINDINGS: CT HEAD FINDINGS Brain: There is moderate age-related atrophy and chronic microvascular ischemic changes. Focal area of hypodensity in the left corona radiata extending from the left lateral ventricle inferiorly (series 201 images 16-20) appears new since the prior CT and may represent stop a subacute and less likely an acute infarct. Associated mild edema. Correlation with clinical exam recommended. MRI is recommended if there is clinical concern for an acute infarct. There is no acute intracranial hemorrhage.  No mass effect or midline shift noted. Vascular: No hyperdense vessel or unexpected calcification. Skull: Normal. Negative for fracture or focal lesion. Sinuses/Orbits: There is partial opacification of multiple ethmoid air cells. No air-fluid levels. There is opacification of the mastoid air cells bilaterally. Other: Small right forehead scalp contusion. CT CERVICAL SPINE FINDINGS Alignment: Normal. Skull base and vertebrae: No  acute fracture. No primary bone lesion or focal pathologic process. Soft tissues and spinal canal: No prevertebral fluid or swelling. No visible canal hematoma. Disc levels: Multilevel degenerative changes with mild facet hypertrophy. Upper chest: Negative. Other: None IMPRESSION: No acute intracranial hemorrhage. Focal area of hypodensity extending from the lateral aspect of the left lateral ventricle inferiorly appears new since the prior CT and may represent a subacute or less likely an acute infarct. Correlation with clinical exam recommended. MRI is recommended if there is clinical concern for acute infarct. Moderate age-related atrophy and chronic microvascular ischemic changes. No acute/ traumatic cervical spine pathology. Electronically Signed   By: Elgie Collard M.D.   On: May 13, 2016 03:38   Mr Maxine Glenn Head Wo Contrast  Result Date: 05/10/2016 CLINICAL DATA:  Acute left MCA infarct. EXAM: MRA NECK WITHOUT AND WITH CONTRAST MRA HEAD WITHOUT CONTRAST TECHNIQUE: Multiplanar and multiecho pulse sequences of the neck were obtained without and with intravenous contrast. Angiographic images of the neck were obtained using MRA technique without and with intravenous contast.; Angiographic images of the Circle of Willis were obtained using MRA technique without intravenous contrast. CONTRAST:  14mL MULTIHANCE GADOBENATE DIMEGLUMINE 529 MG/ML IV SOLN COMPARISON:  None. FINDINGS: MRA NECK FINDINGS The study is moderately motion degraded mainly in the lower neck/ upper chest. Presumed  standard 3 vessel aortic arch branching, though the origin of the left vertebral artery is not established on this study due to artifact. The brachiocephalic and subclavian arteries are grossly patent. However, motion artifact completely obscures the region of the distal brachiocephalic artery and proximal right common carotid artery. Artifact through this level also obscures a portion of the mid left common carotid artery as well as both proximal V1 segments. The portions of the common carotid arteries which are well-visualized are widely patent. The internal carotid arteries are patent bilaterally without evidence of significant stenosis. The distal right cervical ICA is tortuous. The vertebral arteries are patent and codominant. There is no evidence of flow limiting stenosis in the V2 or V3 segments. There are areas of mild-to-moderate narrowing versus artifact in the V3 segments. The V1 segments are not well evaluated. MRA HEAD FINDINGS The study is mildly motion degraded. The visualized distal vertebral arteries are patent to the basilar and codominant. Right PICA origin is patent. Left PICA is not identified, nor are AICAs. Basilar artery is widely patent. SCA origins are patent. There is a fetal type origin of the left PCA with absent left P1. The PCAs are patent without evidence of significant proximal stenosis. Internal carotid arteries are patent from skullbase to carotid termini without evidence of significant stenosis. ACAs and right MCA are patent without evidence of major branch occlusion or flow limiting proximal stenosis. The left M1 segment is patent with at most mild narrowing distally. There is occlusion of the left M2 inferior division at its origin. There is mild reconstitution of flow and some more distal branches. Flow is present in the left M2 superior division, though it has an attenuated appearance. No intracranial aneurysm is identified. IMPRESSION: 1. Occlusion of the left M2 inferior  division. Note that the acute infarct on yesterday's MRI is predominantly in the lateral lenticulostriate territory however. 2. Motion degraded neck MRA with limited assessment of the large vessels in the upper chest and lower neck. No evidence of flow limiting cervical carotid or vertebral artery stenosis within this limitation. Electronically Signed   By: Sebastian Ache M.D.   On: 05/10/2016 08:08   Mr Maxine Glenn  Neck W Wo Contrast  Result Date: 05/10/2016 CLINICAL DATA:  Acute left MCA infarct. EXAM: MRA NECK WITHOUT AND WITH CONTRAST MRA HEAD WITHOUT CONTRAST TECHNIQUE: Multiplanar and multiecho pulse sequences of the neck were obtained without and with intravenous contrast. Angiographic images of the neck were obtained using MRA technique without and with intravenous contast.; Angiographic images of the Circle of Willis were obtained using MRA technique without intravenous contrast. CONTRAST:  14mL MULTIHANCE GADOBENATE DIMEGLUMINE 529 MG/ML IV SOLN COMPARISON:  None. FINDINGS: MRA NECK FINDINGS The study is moderately motion degraded mainly in the lower neck/ upper chest. Presumed standard 3 vessel aortic arch branching, though the origin of the left vertebral artery is not established on this study due to artifact. The brachiocephalic and subclavian arteries are grossly patent. However, motion artifact completely obscures the region of the distal brachiocephalic artery and proximal right common carotid artery. Artifact through this level also obscures a portion of the mid left common carotid artery as well as both proximal V1 segments. The portions of the common carotid arteries which are well-visualized are widely patent. The internal carotid arteries are patent bilaterally without evidence of significant stenosis. The distal right cervical ICA is tortuous. The vertebral arteries are patent and codominant. There is no evidence of flow limiting stenosis in the V2 or V3 segments. There are areas of  mild-to-moderate narrowing versus artifact in the V3 segments. The V1 segments are not well evaluated. MRA HEAD FINDINGS The study is mildly motion degraded. The visualized distal vertebral arteries are patent to the basilar and codominant. Right PICA origin is patent. Left PICA is not identified, nor are AICAs. Basilar artery is widely patent. SCA origins are patent. There is a fetal type origin of the left PCA with absent left P1. The PCAs are patent without evidence of significant proximal stenosis. Internal carotid arteries are patent from skullbase to carotid termini without evidence of significant stenosis. ACAs and right MCA are patent without evidence of major branch occlusion or flow limiting proximal stenosis. The left M1 segment is patent with at most mild narrowing distally. There is occlusion of the left M2 inferior division at its origin. There is mild reconstitution of flow and some more distal branches. Flow is present in the left M2 superior division, though it has an attenuated appearance. No intracranial aneurysm is identified. IMPRESSION: 1. Occlusion of the left M2 inferior division. Note that the acute infarct on yesterday's MRI is predominantly in the lateral lenticulostriate territory however. 2. Motion degraded neck MRA with limited assessment of the large vessels in the upper chest and lower neck. No evidence of flow limiting cervical carotid or vertebral artery stenosis within this limitation. Electronically Signed   By: Sebastian Ache M.D.   On: 05/10/2016 08:08   Mr Brain Wo Contrast  Result Date: 2016-05-11 CLINICAL DATA:  Fall with right-sided weakness. EXAM: MRI HEAD WITHOUT CONTRAST TECHNIQUE: Multiplanar, multiecho pulse sequences of the brain and surrounding structures were obtained without intravenous contrast. COMPARISON:  Head CT 05/11/16 Brain MR 04/26/2016 FINDINGS: Brain: There are areas of diffusion restriction within the left caudate head, extending into the putamen;  the left frontal operculum; and of the subinsular white matter. The abnormality is in close proximity to the posterior limb of the internal capsule. There is also a possible focus of diffusion restriction within the left cerebral peduncle. No acute hemorrhage. There is beginning confluent hyperintense T2-weighted signal within the periventricular white matter, most often seen in the setting of chronic microvascular ischemia.  No mass lesion or midline shift. No hydrocephalus or extra-axial fluid collection. The midline structures are normal. No age advanced or lobar predominant atrophy. Vascular: Major intracranial arterial and venous sinus flow voids are preserved. No evidence of chronic microhemorrhage or amyloid angiopathy. Skull and upper cervical spine: The visualized skull base, calvarium, upper cervical spine and extracranial soft tissues are normal. Sinuses/Orbits: Bilateral mastoid effusions. Paranasal sinuses are clear. Normal orbits. IMPRESSION: 1. Acute infarct within the left subinsular white matter, left frontal operculum and left basal ganglia (predominantly the caudate head and putamen). No hemorrhage, midline shift or mass effect. 2. Punctate focus of possible ischemia within the left cerebral peduncle. 3. Chronic microvascular disease. 4. Bilateral mastoid effusions. Electronically Signed   By: Deatra RobinsonKevin  Herman M.D.   On: 07-06-15 05:39   Mr Brain Wo Contrast  Result Date: 04/26/2016 CLINICAL DATA:  Worsening hearing loss over the past 4-5 days. Stroke last month. EXAM: MRI HEAD WITHOUT CONTRAST TECHNIQUE: Multiplanar, multiecho pulse sequences of the brain and surrounding structures were obtained without intravenous contrast. COMPARISON:  04/10/2016 FINDINGS: Brain: Small volume blood products are again seen in the mesial left temporal lobe extending to the region of inferior basal ganglia/ genu of the left internal capsule. Minimal residual diffusion signal abnormality is present in these  locations related to the blood products. Abnormal trace diffusion signal elsewhere in the left greater than right cerebral hemispheres and left cerebellum on the prior study has resolved. There is minimal T2/FLAIR hyperintensity in the mesial left temporal lobe/ basal ganglia region and left temporal occipital region corresponding to the previously demonstrated infarcts. There is no evidence of acute infarct, new intracranial hemorrhage, mass, midline shift, or extra-axial fluid collection. Bilateral periventricular white matter T2 hyperintensities are unchanged and nonspecific but compatible with mild chronic small vessel ischemic disease. There is moderate cerebral atrophy. Vascular: Major intracranial vascular flow voids are preserved. Skull and upper cervical spine: Unremarkable bone marrow signal. Sinuses/Orbits: Unremarkable orbits. Minimal posterior right ethmoid air cell mucosal thickening. Large bilateral mastoid effusions, increased from prior. Fluid is now present in the middle ear cavities bilaterally. Other: None. IMPRESSION: 1. Residual small volume blood products in the mesial left temporal lobe and left internal capsule/inferior basal ganglia region associated with subacute infarcts demonstrated on the prior MRI. 2. Resolved diffusion abnormality associated with the small infarcts elsewhere. 3. No acute infarct or new intracranial abnormality. 4. Large bilateral mastoid and middle ear effusions. Electronically Signed   By: Sebastian AcheAllen  Grady M.D.   On: 04/26/2016 19:03   Dg Chest Port 1 View  Result Date: 05/15/2016 CLINICAL DATA:  Short of breath. EXAM: PORTABLE CHEST 1 VIEW COMPARISON:  05/10/2016 FINDINGS: Cardiac silhouette is normal in size. No mediastinal or hilar masses. No convincing adenopathy. Linear and reticular opacities are noted at the lung bases, similar to the prior study, consistent with a combination scarring and subsegmental atelectasis. Lung base and perihilar chronic bronchitic  changes also stable. There is no evidence of pneumonia or pulmonary edema. No convincing pleural effusion. No pneumothorax. Skeletal structures are demineralized but grossly intact. IMPRESSION: 1. No acute cardiopulmonary disease. 2. Stable appearance from the prior study. Electronically Signed   By: Amie Portlandavid  Ormond M.D.   On: 05/15/2016 17:08   Dg Chest Port 1 View  Result Date: 07-06-15 CLINICAL DATA:  Shortness of Breath following recent fall EXAM: PORTABLE CHEST 1 VIEW COMPARISON:  07-06-15 FINDINGS: Cardiac shadow is stable. Bibasilar atelectatic changes are noted slightly more prominent than that seen on the  prior exam. No pneumothorax or sizable effusion is seen. No bony abnormality is noted. IMPRESSION: Mild bibasilar atelectasis. Electronically Signed   By: Alcide Clever M.D.   On: May 12, 2016 21:56   Dg Chest Port 1 View  Result Date: 04/23/2016 CLINICAL DATA:  Aspiration pneumonia EXAM: PORTABLE CHEST 1 VIEW COMPARISON:  04/23/2016 at 12:20 FINDINGS: Mild streaky opacity in the right days. Left lung is clear. Pulmonary vasculature is normal. No effusions. Hilar, mediastinal and cardiac contours are unremarkable and unchanged. IMPRESSION: Streaky right base opacity. No focal confluent consolidation. No significant change in the interim. Electronically Signed   By: Ellery Plunk M.D.   On: 04/23/2016 23:41   Dg Chest Port 1 View  Result Date: 04/23/2016 CLINICAL DATA:  COPD, aspiration pneumonia. EXAM: PORTABLE CHEST 1 VIEW COMPARISON:  Portable chest x-ray of Sep 20, 2015 FINDINGS: The lungs are mildly hyperinflated. The interstitial markings are coarse. Increased density at the right lung base is consistent with the clinically suspected aspiration pneumonia. The heart and pulmonary vascularity are normal. There is calcification in the wall of the tortuous thoracic aorta. There is no pleural effusion. The bony thorax exhibits no acute abnormality. IMPRESSION: Right basilar pneumonia  consistent with aspiration. COPD. Thoracic aortic atherosclerosis. When the patient can tolerate the procedure, a PA and lateral chest x-ray would be useful. Electronically Signed   By: David  [place] M.D.   On: 04/23/2016 12:34   Dg Chest Port 1 View  Result Date: 04/21/2016 CLINICAL DATA:  Initial evaluation for acute respiratory distress, wheezing. History of COPD. EXAM: PORTABLE CHEST 1 VIEW COMPARISON:  Prior radiograph from 04/21/2016. FINDINGS: Cardiac and mediastinal silhouettes are stable in size and contour, and remain within normal limits. Atherosclerotic disease noted within the aortic arch. Lungs are hypoinflated. Changes related COPD noted. Increased right basilar opacity favored to reflect atelectasis/bronchovascular crowding, although recurrent infiltrate not excluded. Left basilar atelectasis noted. Small left pleural effusion. No pulmonary edema or pneumothorax. No acute osseous abnormality. IMPRESSION: 1. Shallow lung inflation with increased right basilar opacity, favored to reflect atelectasis/ bronchovascular crowding, although recurrent infiltrate could be considered in the correct clinical setting. 2. Small left pleural effusion with associated atelectasis. 3. COPD. Electronically Signed   By: Rise Mu M.D.   On: 04/21/2016 20:38   Dg Chest Port 1 View  Result Date: 04/21/2016 CLINICAL DATA:  Follow-up wheezing, COPD. EXAM: PORTABLE CHEST 1 VIEW COMPARISON:  04/20/2016 FINDINGS: There is hyperinflation of the lungs compatible with COPD. Heart and mediastinal contours are within normal limits. No focal opacities or effusions. No acute bony abnormality. IMPRESSION: COPD.  No active disease. Electronically Signed   By: Charlett Nose M.D.   On: 04/21/2016 08:27   Dg Chest Port 1 View  Result Date: 04/20/2016 CLINICAL DATA:  Pneumonia. EXAM: PORTABLE CHEST 1 VIEW COMPARISON:  04/16/2016. FINDINGS: Mediastinum and hilar structures are normal. Heart size stable. Low lung  volumes with basilar atelectasis. Slight improvement of right base infiltrate. A component of right base bronchiectasis cannot be excluded. Tiny left pleural effusion. No pneumothorax. IMPRESSION: 1. Interim partial clearing of right base infiltrate. A component of bronchiectasis in the right base cannot be excluded. 2. Low lung volumes.  Small left pleural effusion noted. Electronically Signed   By: Maisie Fus  Register   On: 04/20/2016 07:20   Dg Chest Port 1v Same Day  Result Date: 05/10/2016 CLINICAL DATA:  Dyspnea, history COPD, stroke EXAM: PORTABLE CHEST 1 VIEW COMPARISON:  Portable exam 0932 hours compared to 2016/05/12 FINDINGS:  Upper normal heart size. Rotated to the RIGHT. Mediastinal contours and pulmonary vascularity normal. Atherosclerotic calcification aorta. Bronchitic changes with bibasilar atelectasis. Remaining lungs clear. No pleural effusion or pneumothorax. IMPRESSION: Bronchitic changes with bibasilar atelectasis. Aortic atherosclerosis. Electronically Signed   By: Ulyses Southward M.D.   On: 05/10/2016 09:53   Dg Knee Complete 4 Views Right  Result Date: 05/07/2016 CLINICAL DATA:  83 year old female with fall and right knee swelling. EXAM: RIGHT KNEE - COMPLETE 4+ VIEW COMPARISON:  None. FINDINGS: No acute fracture or dislocation. The bones are osteopenic. There is meniscal chondrocalcinosis with mild osteoarthritic changes of the knees. No joint effusion. The soft tissues appear unremarkable. IMPRESSION: No acute fracture or dislocation. Electronically Signed   By: Elgie Collard M.D.   On: 04/26/2016 03:04    Lab Data:  CBC:  Recent Labs Lab 05/12/16 0255 05/13/16 0334 05/16/16 0517  WBC 12.9* 10.9* 9.9  HGB 11.7* 10.9* 11.4*  HCT 36.5 34.9* 36.5  MCV 86.3 87.0 87.3  PLT 215 217 309   Basic Metabolic Panel:  Recent Labs Lab 05/13/16 0334 05/15/16 2013 05/16/16 0517 05/17/16 0639 05/18/16 0231  NA 142 142 146* 144 146*  K 3.6 3.2* 3.3* 4.2 4.3  CL 105 105 107  108 109  CO2 25 22 26 28 27   GLUCOSE 148* 108* 107* 157* 160*  BUN 17 21* 18 23* 32*  CREATININE 0.70 0.70 0.67 0.55 0.60  CALCIUM 7.1* 7.5* 7.3* 7.5* 7.9*  MG  --  2.0 2.4  --  2.9*   GFR: Estimated Creatinine Clearance: 47.8 mL/min (by C-G formula based on SCr of 0.6 mg/dL). Liver Function Tests:  Recent Labs Lab 05/16/16 0517  AST 20  ALT 13*  ALKPHOS 54  BILITOT 0.7  PROT 6.9  ALBUMIN 2.5*   No results for input(s): LIPASE, AMYLASE in the last 168 hours. No results for input(s): AMMONIA in the last 168 hours. Coagulation Profile: No results for input(s): INR, PROTIME in the last 168 hours. Cardiac Enzymes: No results for input(s): CKTOTAL, CKMB, CKMBINDEX, TROPONINI in the last 168 hours. BNP (last 3 results) No results for input(s): PROBNP in the last 8760 hours. HbA1C: No results for input(s): HGBA1C in the last 72 hours. CBG: No results for input(s): GLUCAP in the last 168 hours. Lipid Profile: No results for input(s): CHOL, HDL, LDLCALC, TRIG, CHOLHDL, LDLDIRECT in the last 72 hours. Thyroid Function Tests:  Recent Labs  05/17/16 0644  TSH 1.201   Anemia Panel: No results for input(s): VITAMINB12, FOLATE, FERRITIN, TIBC, IRON, RETICCTPCT in the last 72 hours. Urine analysis:    Component Value Date/Time   COLORURINE YELLOW 05/12/2016 0419   APPEARANCEUR HAZY (A) 04/25/2016 0419   LABSPEC 1.020 04/20/2016 0419   PHURINE 5.0 05/04/2016 0419   GLUCOSEU NEGATIVE 05/14/2016 0419   HGBUR NEGATIVE 05/01/2016 0419   BILIRUBINUR NEGATIVE 05/05/2016 0419   KETONESUR NEGATIVE 04/28/2016 0419   PROTEINUR NEGATIVE 05/02/2016 0419   NITRITE NEGATIVE 04/20/2016 0419   LEUKOCYTESUR NEGATIVE 04/26/2016 0419     Grayton Lobo M.D. PhD [facility] 05/18/2016, 8:41 AM   Between 7am to 7pm - call Pager - [phone]  After 7pm go to [email or web address] - password [email or web address]  Call night coverage person covering after 7pm

## 2016-05-18 NOTE — Plan of Care (Signed)
Problem: Nutrition: Goal: Risk of aspiration will decrease Outcome: Progressing Dys 1 honey thick diet with liquid with tsp only.

## 2016-05-18 DEATH — deceased

## 2016-05-19 ENCOUNTER — Inpatient Hospital Stay (HOSPITAL_COMMUNITY): Payer: Medicare Other

## 2016-05-19 DIAGNOSIS — R0602 Shortness of breath: Secondary | ICD-10-CM

## 2016-05-19 DIAGNOSIS — E785 Hyperlipidemia, unspecified: Secondary | ICD-10-CM

## 2016-05-19 LAB — BASIC METABOLIC PANEL
Anion gap: 9 (ref 5–15)
BUN: 35 mg/dL — ABNORMAL HIGH (ref 6–20)
CALCIUM: 7.8 mg/dL — AB (ref 8.9–10.3)
CO2: 29 mmol/L (ref 22–32)
CREATININE: 0.62 mg/dL (ref 0.44–1.00)
Chloride: 106 mmol/L (ref 101–111)
GFR calc Af Amer: >60 mL/min (ref >60)
GFR calc non Af Amer: >60 mL/min (ref >60)
GLUCOSE: 151 mg/dL — AB (ref 65–99)
Potassium: 5.2 mmol/L — ABNORMAL HIGH (ref 3.5–5.1)
Sodium: 144 mmol/L (ref 135–145)

## 2016-05-19 LAB — CBC
HCT: 39.5 % (ref 36.0–46.0)
Hemoglobin: 12.1 g/dL (ref 12.0–15.0)
MCH: 27.8 pg (ref 26.0–34.0)
MCHC: 30.6 g/dL (ref 30.0–36.0)
MCV: 90.6 fL (ref 78.0–100.0)
Platelets: 427 10*3/uL — ABNORMAL HIGH (ref 150–400)
RBC: 4.36 MIL/uL (ref 3.87–5.11)
RDW: 16.3 % — ABNORMAL HIGH (ref 11.5–15.5)
WBC: 12.9 10*3/uL — ABNORMAL HIGH (ref 4.0–10.5)

## 2016-05-19 MED ORDER — IPRATROPIUM-ALBUTEROL 0.5-2.5 (3) MG/3ML IN SOLN
3.0000 mL | Freq: Three times a day (TID) | RESPIRATORY_TRACT | Status: DC
  Administered 2016-05-19 – 2016-05-21 (×8): 3 mL via RESPIRATORY_TRACT
  Filled 2016-05-19 (×8): qty 3

## 2016-05-19 MED ORDER — ATORVASTATIN CALCIUM 40 MG PO TABS
40.0000 mg | ORAL_TABLET | Freq: Every day | ORAL | Status: DC
  Administered 2016-05-19 – 2016-05-21 (×3): 40 mg via ORAL
  Filled 2016-05-19 (×3): qty 1

## 2016-05-19 MED ORDER — CLOPIDOGREL BISULFATE 75 MG PO TABS
75.0000 mg | ORAL_TABLET | Freq: Every day | ORAL | Status: DC
  Administered 2016-05-19 – 2016-05-20 (×2): 75 mg via ORAL
  Filled 2016-05-19 (×2): qty 1

## 2016-05-19 NOTE — Progress Notes (Signed)
Daily Progress Note   Patient Name: Catherine Day       Date: 05/19/2016 DOB: [birth year]  Age: 83 y.o. MRN#: [id number] Attending Physician: Albertine Grates, MD Primary Care Physician: No primary care provider on file. Admit Date: May 24, 2016  Reason for Consultation/Follow-up: Establishing goals of care  Subjective: I arrived to Catherine Day's room when she was working with physical therapy. She was able to follow simple commands, though is likely more limited by her hearing deficits than confusion. Her gurgling has improved, and is almost entirely gone when she sits fully upright. New today is progressive mottling and purple discoloration of her bilateral feet. Her feet are cold to touch from the ankle down. She is spontaneously moving them, and positive for pulses by doppler (per care nurse)  Length of Stay: 10  Current Medications: Scheduled Meds:  .  stroke: mapping our early stages of recovery book   Does not apply Once  . amoxicillin-clavulanate  1 tablet Oral Q12H  . aspirin  300 mg Rectal Daily   Or  . aspirin  325 mg Oral Daily  . carvedilol  12.5 mg Oral BID WC  . enoxaparin (LOVENOX) injection  40 mg Subcutaneous Q24H  . fluticasone furoate-vilanterol  1 puff Inhalation Daily  . guaiFENesin  600 mg Oral BID  . ipratropium-albuterol  3 mL Nebulization QID  . methylPREDNISolone (SOLU-MEDROL) injection  60 mg Intravenous Q12H  . nystatin   Topical TID    Continuous Infusions:  PRN Meds: acetaminophen **OR** acetaminophen (TYLENOL) oral liquid 160 mg/5 mL **OR** acetaminophen, glycopyrrolate, hydrALAZINE, ipratropium-albuterol, senna-docusate  Physical Exam  Constitutional: Vital signs are normal. She appears ill. No distress. Nasal cannula in place.  Frail and elderly woman resting  comfortably in bed  HENT:  Head: Normocephalic and atraumatic.  Eyes: EOM are normal.  Neck: Normal range of motion.  Cardiovascular: Normal rate, regular rhythm and normal heart sounds.   Pulmonary/Chest: No accessory muscle usage. No tachypnea. No respiratory distress. She has no wheezes. She has rhonchi (audible) in the right upper field and the left upper field.  Abdominal: Soft. Normal appearance.  Neurological: She is alert.  No words spoken during my visit. Does mirror simple movements: spontaenous movement of all extremities. Right side weakness.  Skin: Skin is warm and dry. Bruising noted. There is pallor.  BLE cold. Significant purple mottling on bilateral feet, marked increase compared to 1/1. Dependent edema noted in RUE and RLE.  Psychiatric: She has a normal mood and affect. Her behavior is normal.  No verbalized words. Smiles when I speak to her. Follows simple commands when spoken very loudly and with repeat cues.  Nursing note and vitals reviewed.          Vital Signs: BP 135/67   Pulse 82   Temp 97.9 F (36.6 C) (Oral)   Resp 15   Ht 5\' 2"  (1.575 m)   Wt 74.8 kg (165 lb)   SpO2 99%   BMI 30.18 kg/m  SpO2: SpO2: 99 % O2 Device: O2 Device: Nasal Cannula O2 Flow Rate: O2 Flow Rate (L/min): 3 L/min  Intake/output summary:   Intake/Output Summary (Last 24 hours) at 05/19/16 1004 Last data filed at 05/18/16 1900  Gross per 24 hour  Intake              600 ml  Output                0 ml  Net              600 ml   LBM: Last BM Date: 05/18/16 Baseline Weight: Weight: 73.9 kg (163 lb) Most recent weight: Weight: 74.8 kg (165 lb)       Palliative Assessment/Data: PPS 30%  Flowsheet Rows   Flowsheet Row Most Recent Value  Intake Tab  Referral Department  Hospitalist  Unit at Time of Referral  Intermediate Care Unit  Palliative Care Primary Diagnosis  Neurology  Palliative Care Type  New Palliative care  Reason for referral  Clarify Goals of Care  Date of  Admission  May 27, 2016  Date first seen by Palliative Care  05/11/16  Clinical Assessment  Palliative Performance Scale Score  10%  Psychosocial & Spiritual Assessment  Palliative Care Outcomes  Patient/Family meeting held?  Yes  Who was at the meeting?  son  Palliative Care Outcomes  Clarified goals of care, Provided end of life care assistance, Provided psychosocial or spiritual support, Counseled regarding hospice      Patient Active Problem List   Diagnosis Date Noted  . Palliative care encounter   . Goals of care, counseling/discussion   . At high risk for aspiration   . Cerebrovascular accident (CVA) (HCC)   . Acute encephalopathy 04/23/2016  . UTI due to extended-spectrum beta lactamase (ESBL) producing Escherichia coli   . Chronic combined systolic and diastolic CHF, NYHA class 1 (HCC)   . Cerebrovascular accident (CVA) due to bilateral thrombosis of vertebral arteries (HCC)   . Hypertension 04/14/2016  . Hyperlipidemia 04/14/2016  . 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 Dysphagia 04/14/2016  . Chronic obstructive pulmonary disease (HCC)   . Hypokalemia   . Recent Embolic stroke (HCC) - B MCA and L cerebellar     Palliative Care Assessment & Plan   Patient Profile: 83 y.o. female  with past medical history of COPD on home oxygen and recent stroke in November admitted on 05/27/2016 with fall and worsened right sided deficits. MRI showed infarcts in subcortical left hemisphere. MRA with occlusion of the left M2 division. Repeat CT showed evolving left MCA distribution subacute infarct. Patient failed speech swallow evaluation on 12/24. Of note, she did have a recent hospitalization for stroke requiring tPA and complete revascularization of occluded left MCA, left ACA, and left ICA in interventional radiology. That prior hospitalization was  complicated by ESBL positive UTI and aspiration pneumonia.   Assessment: Catherine Day seems to wax and wane in  alertness. On 12/28 she had a dramatic shift from days prior and was awake, alert, and very interactive. She was cleared for oral intake on a dysphagia 1 with honey thick liquids after MBS. Since being cleared her oral intake has been highly variable, and very dependent on how awake she is. When awake, she eats the majority of her meal (usually only eats ~2 meals per day, per care nurse). I'm concerned that her intake is adequate.  I spoke with her son on 1/1 and expressed by concern for inadequate intake. He had previously wanted a feeding tube if she had been unable to eat, and I wanted to explore his preference in the situation where she can eat but it may be inadequate. He is going to discuss it with his sister. In the meantime, I have asked the dietician to formally assess and conduct a calorie count. This information will likely help guide next steps.   Recommendations/Plan:  DNR/DNI; continue full scope treatment otherwise  Spoke with dietician this morning, she will assess today. Ongoing calorie count (ordered 1/1). I will follow-up with Luisa Hart once dietician's assessment has occurred.   Progressive discoloration and cold feet is new; primary team (Dr. Roda Shutters) notified and has assessed at the bedside  For symptoms: would continue steroids and targeted pulmonary interventions (O2 and duo-nebs)   Goals of Care and Additional Recommendations:  Limitations on Scope of Treatment: Full Scope Treatment-except DNR/DNI  Code Status: DNR   Code Status Orders        Start     Ordered   2016/06/05 0909  Do not attempt resuscitation (DNR)  Continuous    Question Answer Comment  In the event of cardiac or respiratory ARREST Do not call a "code blue"   In the event of cardiac or respiratory ARREST Do not perform Intubation, CPR, defibrillation or ACLS   In the event of cardiac or respiratory ARREST Use medication by any route, position, wound care, and other measures to relive pain and suffering.  May use oxygen, suction and manual treatment of airway obstruction as needed for comfort.      06-05-16 0908    Code Status History    Date Active Date Inactive Code Status Order ID Comments User Context   06/05/16  8:40 AM Jun 05, 2016  9:08 AM Full Code [id number]  Russella Dar, NP Inpatient   06/05/2016  8:18 AM 06/05/16  8:40 AM DNR [id number]  Russella Dar, NP ED   04/24/2016  4:55 PM 04/27/2016  7:50 PM DNR [id number]  Catarina Hartshorn, MD Inpatient   04/15/2016 12:51 AM 04/24/2016  4:54 PM Full Code [id number]  Duayne Cal, NP Inpatient   04/14/2016  2:58 PM 04/14/2016  2:58 PM Full Code [id number]  Charlton Amor, PA-C Inpatient   04/14/2016  2:58 PM 04/15/2016 12:51 AM Full Code [id number]  Charlton Amor, PA-C Inpatient   04/09/2016 11:12 PM 04/10/2016  9:15 AM Full Code [id number]  Julieanne Cotton, MD Inpatient   04/09/2016 11:12 PM 04/09/2016 11:12 PM Full Code [id number]  Rejeana Brock, MD Inpatient    Advance Directive Documentation   Flowsheet Row Most Recent Value  Type of Advance Directive  Out of facility DNR (pink MOST or yellow form), Healthcare Power of Attorney  Pre-existing out of facility DNR order (yellow form or pink MOST form)  Yellow form placed in chart (  order not valid for inpatient use)  "MOST" Form in Place?  No data       Prognosis:   Unable to determine  Discharge Planning:  Skilled Nursing Facility for rehab with Palliative care service follow-up  Care plan was discussed with pt's son.  Thank you for allowing the Palliative Medicine Team to assist in the care of this patient.   Time In: 0950 Time Out: 1025 Total Time 35 minutes Prolonged Time Billed  no       Greater than 50%  of this time was spent counseling and coordinating care related to the above assessment and plan.  Murrell ConverseSarah Aniyiah Zell [facility]NP-C Palliative Medicine Team  [phone] (cell, 7a-5p) [phone] (team phone)

## 2016-05-19 NOTE — Progress Notes (Addendum)
Initial Nutrition Assessment  DOCUMENTATION CODES:   Obesity unspecified  INTERVENTION:    Continue 48 hour calorie count   Ensure Enlive po BID (honey thick consistency), each supplement provides 350 kcal and 20 grams of protein  NUTRITION DIAGNOSIS:   Inadequate oral intake related to dysphagia (decreased alertness) as evidenced by meal completion < 50%  GOAL:   Patient will meet greater than or equal to 90% of their needs  MONITOR:   PO intake, Supplement acceptance, Labs, Weight trends, I & O's  REASON FOR ASSESSMENT:   Consult Assessment of nutrition requirement/status  ASSESSMENT:   83 y.o. female with a Past Medical History w/ pmh significant for COPD and stroke who presents with recurrent stroke with R sided deficit.  Pt sleeping upon RD visit >> called patient's son, Catherine Day but no answer. Calorie count envelope on door >> no documented meal tickets available at this time. Spoke with RN >> pt eats well when she is awakened and has feeding assistance. No oral nutrition supplements in place >>> will add to see if pt will take. Labs and medications reviewed.  Diet Order:  DIET - DYS 1 Room service appropriate? Yes; Fluid consistency: Honey Thick  Skin:  Reviewed, no issues  Last BM:  1/1  Height:   Ht Readings from Last 1 Encounters:  06-27-15 5\' 2"  (1.575 m)    Weight:   Wt Readings from Last 1 Encounters:  06-27-15 165 lb (74.8 kg)    Ideal Body Weight:  50 kg  BMI:  Body mass index is 30.18 kg/m.  Estimated Nutritional Needs:   Kcal:  1500-1700  Protein:  75-85 gm  Fluid:  1.5-1.7 L  EDUCATION NEEDS:   No education needs identified at this time  Catherine Day, RD, LDN Pager #: [phone] After-Hours Pager #: [phone]

## 2016-05-19 NOTE — Progress Notes (Signed)
Occupational Therapy Treatment Patient Details Name: Catherine ClossRegina Stoutenburg MRN: [id number] DOB: 02/03/1928 Today's Date: 05/19/2016    History of present illness 83 y.o. female admitted on 05/15/2016 for fall (recurrent stroke and worsened R side deficits) with aspiration pneumonia   OT comments  Mottling and purple discoloration of her bilateral feet today are cold to touch, reported this to RN. RN and palliative RN in to see pt. Pt's RN directed OT/PT to not get pt OOB this session. Pt with some gurgling that decreased once sitting upright at EOB. Pt able to respond "yes" and "no". Pt continues to require + 2 assist with mobility and total A with ADLs. Pt did not follow commands to wash face , but followed commands to squeeze therapist hands. OT will continue to follow acutely  Follow Up Recommendations  SNF;Supervision/Assistance - 24 hour    Equipment Recommendations  None recommended by OT    Recommendations for Other Services      Precautions / Restrictions Precautions Precautions: Fall Precaution Comments:  2 liters O2 Restrictions Weight Bearing Restrictions: No       Mobility Bed Mobility Overal bed mobility: Needs Assistance Bed Mobility: Rolling;Supine to Sit;Sit to Supine Rolling: Max assist   Supine to sit: +2 for physical assistance;Total assist Sit to supine: +2 for physical assistance;Total assist   General bed mobility comments: Pt with intentional movement upon command today in B UEs. Total A using pad to sit EOB to rotate hips, LEs and to  elevate trunk.   Transfers                 General transfer comment: Not getting pt OOB per RN due to lethargy with B feet blue and cold to touch from heels to toes, although pt able to move on command.     Balance Overall balance assessment: Needs assistance Sitting-balance support: Feet supported;Single extremity supported Sitting balance-Leahy Scale: Poor Sitting balance - Comments: min A for balance/support sitting  EOB x ~ 5 minutes Postural control: Right lateral lean                         ADLs  total A                                              Vision  unable to properly assess                              Cognition   Behavior During Therapy: Flat affect Overall Cognitive Status: Difficult to assess Area of Impairment: Following commands;Attention;Awareness;Problem solving        Following Commands: Follows one step commands inconsistently;Follows one step commands with increased time Safety/Judgement: Decreased awareness of deficits;Decreased awareness of safety   Problem Solving: Slow processing;Requires verbal cues      Extremity/Trunk Assessment   impaired            Exercises Other Exercises Other Exercises: PROM/AAROM L UE          General Comments  Pt pleasant, lethargic    Pertinent Vitals/ Pain       Faces Pain Scale: No hurt Pain Intervention(s): Monitored during session  Frequency  Min 2X/week        Progress Toward Goals  OT Goals(current goals can now be found in the care plan section)  Progress towards OT goals: OT to reassess next treatment  Acute Rehab OT Goals Patient Stated Goal: none stated  Plan Discharge plan remains appropriate    Co-evaluation    PT/OT/SLP Co-Evaluation/Treatment: Yes Reason for Co-Treatment: Complexity of the patient's impairments (multi-system involvement);For patient/therapist safety PT goals addressed during session: Mobility/safety with mobility OT goals addressed during session: ADL's and self-care      End of Session     Activity Tolerance Patient limited by fatigue;Patient limited by lethargy   Patient Left with call bell/phone within reach;in bed;with nursing/sitter in room (B mittens re applied)             Time: 1610-9604 OT Time Calculation (min): 32  min  Charges: OT General Charges $OT Visit: 1 Procedure OT Treatments $Therapeutic Activity: 8-22 mins  Galen Manila 05/19/2016, 1:57 PM

## 2016-05-19 NOTE — Progress Notes (Addendum)
STROKE TEAM PROGRESS NOTE   SUBJECTIVE (INTERVAL HISTORY) Pt was seen in room. Significant respiratory distress, difficulty with catching her breath. However, her CXR, ABG, O2 sat were all normal. On steroids and breathing treatment for possible COPD treatment.    OBJECTIVE Temp:  [97.5 F (36.4 C)-98.9 F (37.2 C)] 97.9 F (36.6 C) (01/02 1205) Pulse Rate:  [65-95] 85 (01/02 1205) Cardiac Rhythm: Normal sinus rhythm;Bundle branch block (01/02 0733) Resp:  [15-24] 24 (01/02 1205) BP: (128-199)/(58-100) 156/83 (01/02 1205) SpO2:  [91 %-99 %] 99 % (01/02 1205)  CBC:   Recent Labs Lab 05/16/16 0517 05/19/16 0611  WBC 9.9 12.9*  HGB 11.4* 12.1  HCT 36.5 39.5  MCV 87.3 90.6  PLT 309 427*    Basic Metabolic Panel:   Recent Labs Lab 05/16/16 0517  05/18/16 0231 05/19/16 0611  NA 146*  < > 146* 144  K 3.3*  < > 4.3 5.2*  CL 107  < > 109 106  CO2 26  < > 27 29  GLUCOSE 107*  < > 160* 151*  BUN 18  < > 32* 35*  CREATININE 0.67  < > 0.60 0.62  CALCIUM 7.3*  < > 7.9* 7.8*  MG 2.4  --  2.9*  --   < > = values in this interval not displayed.  Lipid Panel:     Component Value Date/Time   CHOL 147 05/11/2016 0202   TRIG 95 05/11/2016 0202   HDL 42 05/11/2016 0202   CHOLHDL 3.5 05/11/2016 0202   VLDL 19 05/11/2016 0202   LDLCALC 86 05/11/2016 0202   HgbA1c:  Lab Results  Component Value Date   HGBA1C 5.8 (H) 04/10/2016    IMAGING I have personally reviewed the radiological images below and agree with the radiology interpretations.  Ct Head Wo Contrast 05/10/2016 1. Evolving left MCA distribution subacute infarct. 2. Chronic mastoid fluid bilaterally.  2015-09-16 No acute intracranial hemorrhage.  Focal area of hypodensity extending from the lateral aspect of the left lateral ventricle inferiorly appears new since the prior CT and may represent a subacute or less likely an acute infarct.  Correlation with clinical exam recommended. MRI is recommended if there  is clinical concern for acute infarct.  Moderate age-related atrophy and chronic microvascular ischemic changes.  No acute/ traumatic cervical spine pathology.   Mri / Mra Head and Neck Wo Contrast 05/10/2016 1. Occlusion of the left M2 inferior division.  Note that the acute infarct on yesterday's MRI is predominantly in the lateral lenticulostriate territory however.  2. Motion degraded neck MRA with limited assessment of the large vessels in the upper chest and lower neck.  No evidence of flow limiting cervical carotid or vertebral artery stenosis within this limitation.   Mri Brain Wo Contrast 2015-09-16 1. Acute infarct within the left subinsular white matter, left frontal operculum and left basal ganglia (predominantly the caudate head and putamen). No hemorrhage, midline shift or mass effect.  2. Punctate focus of possible ischemia within the left cerebral peduncle.  3. Chronic microvascular disease.  4. Bilateral mastoid effusions.   EEG is abnormal due to the presence of moderate diffuse slowing of the background with additional focal slowing over the left hemisphere   PHYSICAL EXAM  Temp:  [97.5 F (36.4 C)-98.9 F (37.2 C)] 97.7 F (36.5 C) (01/02 1600) Pulse Rate:  [65-86] 86 (01/02 1600) Resp:  [15-24] 21 (01/02 1600) BP: (128-199)/(58-100) 156/70 (01/02 1600) SpO2:  [94 %-99 %] 97 % (01/02 1600)  General - Well nourished, well developed, in significant respiratory distress.  Ophthalmologic - Fundi not visualized due to distress.  Cardiovascular - Regular rate and rhythm.  Extremities - b/l anterior feet purple color and cold  Neuro - limited neuro exam due to respiratory distress. Pt not able to speak, answer questions or repeat sentences due to respiratory distress. Not following commands due to distress. Intermittent eye opening, PERRL, able to attend on both sides, facial symmetrical. Inconsistent eye blinking with visual threat. RUE 0/5. Right LE 2/5 on pain  stimulation. LUE 4-/5 and LLE 3/5 on pain stimulation. No babinski and DTR 1+. Sensation, coordination and gait not tested       ASSESSMENT/PLAN [name] is a 83 y.o. female with history of COPD, recent left MCA stroke with left carotid mechanical embolectomy in 03/2016 discharged with ASA, admitted again on 2016/05/20 due to fall at SNF, She was found to have a L MCA embolic infarct, source unknown.  She did not receive IV t-PA due to unknown time of onset. She was followed by the stroke team, put back on ASA and signed off 12/26. We were asked to reconsult 1/2 to consider stroke management due to b/l feet ischemic changes and concerning for embolic events.  Stroke:  Dominant L MCA infarct embolic - source unknown  Resultant  Dense right hemiparesis, dysphagia  MRI - acute infarct within the left subinsular white matter, left frontal operculum and left basal ganglia  MRA head and neck - occlusion of the left M2 inferior division.   Repeat CT head evolving L MCA infarct. No new abnormality  TEE 04/13/2016 - EF 45 - 50%. No cardiac source of emboli identified.   LDL - 04/10/2016 - 86  HgbA1c - 04/10/2016 - 5.8  VTE prophylaxis - on lovenox  DIET - DYS 1 Room service appropriate? Yes; Fluid consistency: Honey Thick  No antithrombotic prior to admission, now on aspirin 300 mg suppository daily. Due to intracranial stenosis (left MCA), will recommend add plavix for stroke prevention. However, if found to have embolic ischemic feet, will agree to start anticoagulation. Otherwise, would recommend loop recorder or 30 day cardiac event monitoring to rule out afib.  Patient counseled to be compliant with her antithrombotic medications  Palliative care following - currently DNR but ok for PEG and SNF placement  Therapy recommendations: pending   Disposition:  Anticipate return to SNF  B/l ischemic feet  LE venous and artery ultrasound pending  Concerning for embolic events  If  confirmed, along with embolic stroke picture, agree to start anticoagulation   Hypertension  Stable  BP goal normotensive  Hyperlipidemia  Home meds:  Lipitor 40 mg daily   LDL 86, goal < 70  Resume Lipitor   Continue statin at discharge  Other Stroke Risk Factors  Advanced age  Obesity, Body mass index is 30.18 kg/m., recommend weight loss, diet and exercise as appropriate   Hx stroke/TIA -  left carotid mechanical embolectomy in 03/2016   Chronic combined systolid and diastolic CHF  Other Active Problems  Acute Encephalopathy (not associated with stroke)  Mild leukocytosis 12.9 (afebrile)  COPD w/ hypoxic respiratory failure on home O2  Aspiration PNA  NSVT  Hospital day # 10  Marvel Plan, MD PhD Stroke Neurology 05/19/2016 5:53 PM   To contact Stroke Continuity provider, please refer to [email or web address]. After hours, contact General Neurology

## 2016-05-19 NOTE — Progress Notes (Signed)
RT suctioned patients throat with only a small amount of white thick secretions return

## 2016-05-19 NOTE — Progress Notes (Signed)
Physical Therapy Treatment Patient Details Name: Catherine Day MRN: [id number] DOB: 83 Oct 10, 1927 Today's Date: 05/19/2016    History of Present Illness 83 y.o. female admitted on 05/26/16 for fall (recurrent stroke and worsened R side deficits) with aspiration pneumonia    PT Comments    Patient seen for therapy progression. Tolerated increased time at EOB. Patient following some simple commands with improved consistency. Continues to required 2 person assist for all mobility.   OF NOTE: nsg called to room as well as MD due to patient with significant mottling and purple discoloration of bilateral feet. Both were extremely cold to touch but did show some capillary refill. Will continue to see and progress as tolerated.  Follow Up Recommendations  SNF     Equipment Recommendations  None recommended by PT    Recommendations for Other Services Rehab consult     Precautions / Restrictions Precautions Precautions: Fall Precaution Comments:  2 liters O2 Restrictions Weight Bearing Restrictions: No    Mobility  Bed Mobility Overal bed mobility: Needs Assistance Bed Mobility: Rolling;Supine to Sit;Sit to Supine Rolling: Max assist   Supine to sit: +2 for physical assistance;Total assist Sit to supine: +2 for physical assistance;Total assist   General bed mobility comments: Pt with intentional movement upon command today in B UEs. Total A using pad to sit EOB to rotate hips, LEs and to  elevate trunk.   Transfers                 General transfer comment: Not getting pt OOB per RN due to lethargy with B feet blue and cold to touch from heels to toes, although pt able to move on command.   Ambulation/Gait                 Stairs            Wheelchair Mobility    Modified Rankin (Stroke Patients Only) Modified Rankin (Stroke Patients Only) Pre-Morbid Rankin Score: Moderately severe disability Modified Rankin: Severe disability     Balance Overall balance  assessment: Needs assistance Sitting-balance support: Feet supported;Single extremity supported Sitting balance-Leahy Scale: Poor Sitting balance - Comments: min assist for sitting balance today, able to maintain midline breifly but continues to demonstrate lateral lean to the right Postural control: Right lateral lean                          Cognition Arousal/Alertness: Lethargic Behavior During Therapy: Flat affect Overall Cognitive Status: Difficult to assess Area of Impairment: Following commands;Attention;Awareness;Problem solving       Following Commands: Follows one step commands inconsistently;Follows one step commands with increased time Safety/Judgement: Decreased awareness of deficits;Decreased awareness of safety   Problem Solving: Slow processing;Requires verbal cues      Exercises Other Exercises Other Exercises: PROM/AAROM L UE    General Comments        Pertinent Vitals/Pain Faces Pain Scale: No hurt Pain Intervention(s): Monitored during session    Home Living                      Prior Function            PT Goals (current goals can now be found in the care plan section) Acute Rehab PT Goals Patient Stated Goal: none stated PT Goal Formulation: With patient Time For Goal Achievement: 05/27/16 Potential to Achieve Goals: Fair Progress towards PT goals: Progressing toward goals (modest progression)    Frequency  Min 2X/week      PT Plan Current plan remains appropriate    Co-evaluation PT/OT/SLP Co-Evaluation/Treatment: Yes Reason for Co-Treatment: Complexity of the patient's impairments (multi-system involvement);For patient/therapist safety PT goals addressed during session: Mobility/safety with mobility OT goals addressed during session: ADL's and self-care     End of Session Equipment Utilized During Treatment: Oxygen Activity Tolerance: Patient tolerated treatment well Patient left: in bed;with call  bell/phone within reach;with bed alarm set     Time: 1131-1200 PT Time Calculation (min) (ACUTE ONLY): 29 min  Charges:  $Therapeutic Activity: 8-22 mins                    G Codes:      Fabio AsaDevon J Anaka Beazer 05/19/2016, 4:19 PM Charlotte Crumbevon Diem Dicocco, PT DPT  [phone]

## 2016-05-19 NOTE — Progress Notes (Addendum)
[facility]                                                                              Patient Demographics  Catherine Day, is a 83 y.o. female, DOB - 07-01-1927, ZOX:[id number]  Admit date - 2016/05/15   Admitting Physician Haydee Salter, MD  Outpatient Primary MD for the patient is No primary care provider on file.  Outpatient specialists:   LOS - 10  days    Chief Complaint  Patient presents with  . Fall       Brief summary   Patient is a 83 year old female with recent protracted hospitalization for left-sided embolic stroke s/p TPA and revascularization in 03/2016, COPD, O2 dependent, prior hospitalization course complicated by ESBL positive Escherichia coli UTI and aspiration pneumonia. She was discharged to skilled nursing facility on 12//11.  Patient was sent to ER from SNF on 12/23 after a fall. Per ER notes the patient fell about 11:30 PM and at that time it was discovered that the patient was not moving the right side as per previous baseline. MRI showed acute infarcts within the left subinsular white matter, left frontal operculum the left basal ganglia with an area of punctate possible ischemia in the left cerebral peduncle.   Neurology consulted recommended asa 325 and consider loop recorder if family does not choose comfort measures , neurology signed off on 12/28.   Patient was minimal responsive, comfort measures considered initially, from 12/28 patient started to be awake and able to eat and follow commands, now family is thinking about possible feeding tube and snf placement.  Patient continue to have upper airway sounds, though cxr and abg unremakable.  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  venous doppler/abi pending, may need to start anticoagulation   Assessment & Plan    Acute CVA in the setting of Recent Embolic stroke (HCC) - B MCA and L  cerebellar/Cerebrovascular accident (CVA) due to bilateral thrombosis of vertebral arteries  -Patient recently admitted for acute stroke requiring tPA as well as complete revascularization of occluded left MCA, left ACA and left ICA terminus in interventional radiology. Baseline deficits prior to this hospitalization included continued right lower extremity weakness with associated gait disturbance and mild to moderate right upper extremity weakness. -Patient now admitted with right-sided hemiparesis, flaccid with findings of new left side multiple area strokes on MRI, also has expressive aphasia - MRA of the brain showed occlusion of the left M2 division, acute infarct on MRIs predominantly in the lateral lenticular striate territory.  -Underwent TEE previous admission with no embolic source found; no indication to repeat echocardiogram this admission -Full stroke evaluation last admission so no indication to repeat carotid duplex, hemoglobin A1c or lipid panel this admission -- she initially failed swallow eval, not interactive, she was on npo , getting rectal asa, Repeat CT head showed evolving left MCA distribution subacute infarct, initially patient did not show any meaningful clinical improvement, palliative care consulted, and comfort care was considered - she showed drastic improvement on 12/28, smiling, interactive and follow command,  Passed swallow eval with dysphagia diet (puree, honey thick liquid), continue ivf for another 24hrs then d/c if consistent oral intake. -Neurology consulted and signed off on 12/28, reconsulted neurology on 1/2 in regarding antiplt or anticoagulation therapy due to concerning for embolic symdrom ( bilateral purple feet) -still drowsy most of the day, with inconsistent  oral intake, Nutrition consulted for Calorie counting, -family is in the process of making decision about feeding tube , palliative care and neurology following   Acute encephalopathy with hypoxic  respiratory failure, aspiration pneumonia, dysphagia, aspirating -- IV Zosyn started on 12/24, stopped on 12/29 - started to improved from 12/28, but mental status still fluctuating, concerns about silent aspiration, continue abx but changed to oral augmentin, strict aspiration precaution, speech following daily, family agreed to feeding tube if needed -drowsy, poor oral intake, may need feeding tube   Bilateral purple feet started on 1/2 Unclear etiology , concerning for embolic phenomenon  Blood culture, venous doppler and abi pending  Audible gurgling cxr unremarkable, abg unremarkable I am concerned that patient is not able to clear her secretion effectively, need frequent suction.     Chronic combined systolic and diastolic CHF, NYHA class 1  --TEE previous admission revealed EF of 45-50% with grade 1 diastolic dysfunction -home lasix held initially due to dry, npo, she was on ivf -bp start to increase, has wheezing on 12/29, one dose iv lasix, d/c ivf, change iv abx to oral , stat cxr no acute findings on 12/29 -inconsistence oral intake, remain off ivf, hold lasix  NSVT: will check tsh, keep k>4, mag>2, started coreg and titrate up   Hypertension -Allow for permissive hypertension initially, gradually restart home bp meds diovan, low dose coreg started on 12/30 due to NSVT -iv lasix on 12/29 -12/31bp elevated,  increase coreg   chronic hypoxic respiratory failure on home o2 2liter / Chronic obstructive pulmonary disease with chronic respiratory failure - on DuoNeb's -wheezing persistently , continue nebs ,abx, add steroids Taper steroids    Hyperlipidemia -Holding Lipitor while NPO  FTT: palliative care input appreciated     Code Status: DNR DVT Prophylaxis:  Lovenox  Family Communication: patient and son at bedside   Disposition Plan:   May need feeding tube placement,  Time Spent in minutes   35 minutes  Procedures:  MRI brain MRA brain   Consultants:    Neurology  Palliative care  Antimicrobials:   IV Zosyn 12/24 to 12/29  augmentin from 12/29   Medications  Scheduled Meds: .  stroke: mapping our early stages of recovery book   Does not apply Once  . amoxicillin-clavulanate  1 tablet Oral Q12H  . aspirin  300 mg Rectal Daily   Or  . aspirin  325 mg Oral Daily  . carvedilol  12.5 mg Oral BID WC  . enoxaparin (LOVENOX) injection  40 mg Subcutaneous Q24H  . fluticasone furoate-vilanterol  1 puff Inhalation Daily  . guaiFENesin  600 mg Oral BID  . ipratropium-albuterol  3 mL Nebulization QID  . methylPREDNISolone (SOLU-MEDROL) injection  60 mg Intravenous Q12H  . nystatin   Topical TID   Continuous Infusions:  PRN Meds:.acetaminophen **OR** acetaminophen (TYLENOL) oral liquid 160 mg/5 mL **OR** acetaminophen, glycopyrrolate, hydrALAZINE, ipratropium-albuterol, senna-docusate   Antibiotics   Anti-infectives    Start     Dose/Rate Route Frequency Ordered Stop   05/15/16 2200  amoxicillin-clavulanate (AUGMENTIN) 875-125 MG per tablet 1 tablet     1 tablet Oral Every 12 hours 05/15/16 1634  05/10/16 0900  piperacillin-tazobactam (ZOSYN) IVPB 3.375 g  Status:  Discontinued     3.375 g 12.5 mL/hr over 240 Minutes Intravenous Every 8 hours 05/10/16 0859 05/15/16 1633        Subjective:   Hughie ClossRegina Hannum was seen and examined today. audible gurgling /upper airway sound, awake, follow command, very hard of hearing Son at bedside   Objective:   Vitals:   05/19/16 0400 05/19/16 0700 05/19/16 0737 05/19/16 0748  BP: (!) 142/72  (!) 128/58   Pulse:   65   Resp:   15   Temp:   97.9 F (36.6 C)   TempSrc:  Oral Oral   SpO2:   99% 99%  Weight:      Height:        Intake/Output Summary (Last 24 hours) at 05/19/16 0756 Last data filed at 05/18/16 1900  Gross per 24 hour  Intake              840 ml  Output                0 ml  Net              840 ml     Wt Readings from Last 3 Encounters:  01-04-2016 74.8 kg  (165 lb)  04/27/16 74.1 kg (163 lb 5.8 oz)  04/14/16 91.2 kg (201 lb)     Exam  General: awake, audible gurgling  Neck: Supple, no JVD  Cardiovascular: S1 S2 clear, RRR  Respiratory:  Audible gurgling  Gastrointestinal: Soft, nontender, nondistended, + bowel sounds  Ext: trace pitting edema lower extremity  has resolved  Neuro:  right hemiparesis, awake  Skin: purple color bilateral plantar surface and toes, palpable pedal pulse    Data Reviewed:  I have personally reviewed following labs and imaging studies  Micro Results Recent Results (from the past 240 hour(s))  MRSA PCR Screening     Status: Abnormal   Collection Time: 05/10/16  3:51 PM  Result Value Ref Range Status   MRSA by PCR POSITIVE (A) NEGATIVE Final    Comment:        The GeneXpert MRSA Assay (FDA approved for NASAL specimens only), is one component of a comprehensive MRSA colonization surveillance program. It is not intended to diagnose MRSA infection nor to guide or monitor treatment for MRSA infections. RESULT CALLED TO, READ BACK BY AND VERIFIED WITH: Fayrene FearingLAURA TURNER RN AT [id number] 05/10/16 BY [facility] [email or web address]     Radiology Reports Dg Chest 2 View  Result Date: 02/04/16 CLINICAL DATA:  83 year old female with fall.  History of COPD. EXAM: CHEST  2 VIEW COMPARISON:  Chest radiograph dated 06/25/2015 FINDINGS: There is shallow inspiration. Bibasilar streaky densities most compatible with atelectatic changes/ scarring. Infiltrate is less likely. No pleural effusion or pneumothorax. Stable cardiac silhouette. The aorta is tortuous. There is osteopenia with degenerative changes of the spine. No acute osseous pathology. IMPRESSION: Shallow inspiration with bibasilar atelectatic changes. Infiltrate is less likely. Electronically Signed   By: Elgie CollardArash  Radparvar M.D.   On: 02/04/16 03:05   Ct Head Wo Contrast  Result Date: 05/10/2016 CLINICAL DATA:  Acute encephalopathy. Recent left MCA distribution infarct.  EXAM: CT HEAD WITHOUT CONTRAST TECHNIQUE: Contiguous axial images were obtained from the base of the skull through the vertex without intravenous contrast. COMPARISON:  MRI of 02/04/16.  CT 02/04/16. FINDINGS: Brain: Expected cerebral volume loss for age. Moderate low density in the periventricular white matter likely related to small vessel  disease. Evolution of left MCA distribution infarct, slightly more well-defined today. Examples in the lateral left basal ganglia, subinsular region, and extending into the periventricular white matter of the left frontal lobe. No significant mass effect or midline shift. No complicating hemorrhage, hydrocephalus, intra-axial, or extra-axial fluid collection. Vascular: Intracranial carotid atherosclerosis. Skull: Normal Sinuses/Orbits: Normal orbits and globes. Minimal ethmoid air cell mucosal thickening. Chronic bilateral mastoid effusions are unchanged. Other: None IMPRESSION: 1. Evolving left MCA distribution subacute infarct. 2. Chronic mastoid fluid bilaterally. Electronically Signed   By: Jeronimo Greaves M.D.   On: 05/10/2016 17:56   Ct Head Wo Contrast  Result Date: 05/12/2016 CLINICAL DATA:  83 year old female with fall and laceration to back of the head. EXAM: CT HEAD WITHOUT CONTRAST CT CERVICAL SPINE WITHOUT CONTRAST TECHNIQUE: Multidetector CT imaging of the head and cervical spine was performed following the standard protocol without intravenous contrast. Multiplanar CT image reconstructions of the cervical spine were also generated. COMPARISON:  Brain MRI dated 04/26/2016 and head CT dated 04/09/2016 FINDINGS: CT HEAD FINDINGS Brain: There is moderate age-related atrophy and chronic microvascular ischemic changes. Focal area of hypodensity in the left corona radiata extending from the left lateral ventricle inferiorly (series 201 images 16-20) appears new since the prior CT and may represent stop a subacute and less likely an acute infarct. Associated mild  edema. Correlation with clinical exam recommended. MRI is recommended if there is clinical concern for an acute infarct. There is no acute intracranial hemorrhage. No mass effect or midline shift noted. Vascular: No hyperdense vessel or unexpected calcification. Skull: Normal. Negative for fracture or focal lesion. Sinuses/Orbits: There is partial opacification of multiple ethmoid air cells. No air-fluid levels. There is opacification of the mastoid air cells bilaterally. Other: Small right forehead scalp contusion. CT CERVICAL SPINE FINDINGS Alignment: Normal. Skull base and vertebrae: No acute fracture. No primary bone lesion or focal pathologic process. Soft tissues and spinal canal: No prevertebral fluid or swelling. No visible canal hematoma. Disc levels: Multilevel degenerative changes with mild facet hypertrophy. Upper chest: Negative. Other: None IMPRESSION: No acute intracranial hemorrhage. Focal area of hypodensity extending from the lateral aspect of the left lateral ventricle inferiorly appears new since the prior CT and may represent a subacute or less likely an acute infarct. Correlation with clinical exam recommended. MRI is recommended if there is clinical concern for acute infarct. Moderate age-related atrophy and chronic microvascular ischemic changes. No acute/ traumatic cervical spine pathology. Electronically Signed   By: Elgie Collard M.D.   On: 05/14/2016 03:38   Ct Cervical Spine Wo Contrast  Result Date: 05/17/2016 CLINICAL DATA:  83 year old female with fall and laceration to back of the head. EXAM: CT HEAD WITHOUT CONTRAST CT CERVICAL SPINE WITHOUT CONTRAST TECHNIQUE: Multidetector CT imaging of the head and cervical spine was performed following the standard protocol without intravenous contrast. Multiplanar CT image reconstructions of the cervical spine were also generated. COMPARISON:  Brain MRI dated 04/26/2016 and head CT dated 04/09/2016 FINDINGS: CT HEAD FINDINGS Brain:  There is moderate age-related atrophy and chronic microvascular ischemic changes. Focal area of hypodensity in the left corona radiata extending from the left lateral ventricle inferiorly (series 201 images 16-20) appears new since the prior CT and may represent stop a subacute and less likely an acute infarct. Associated mild edema. Correlation with clinical exam recommended. MRI is recommended if there is clinical concern for an acute infarct. There is no acute intracranial hemorrhage. No mass effect or midline shift noted. Vascular: No  hyperdense vessel or unexpected calcification. Skull: Normal. Negative for fracture or focal lesion. Sinuses/Orbits: There is partial opacification of multiple ethmoid air cells. No air-fluid levels. There is opacification of the mastoid air cells bilaterally. Other: Small right forehead scalp contusion. CT CERVICAL SPINE FINDINGS Alignment: Normal. Skull base and vertebrae: No acute fracture. No primary bone lesion or focal pathologic process. Soft tissues and spinal canal: No prevertebral fluid or swelling. No visible canal hematoma. Disc levels: Multilevel degenerative changes with mild facet hypertrophy. Upper chest: Negative. Other: None IMPRESSION: No acute intracranial hemorrhage. Focal area of hypodensity extending from the lateral aspect of the left lateral ventricle inferiorly appears new since the prior CT and may represent a subacute or less likely an acute infarct. Correlation with clinical exam recommended. MRI is recommended if there is clinical concern for acute infarct. Moderate age-related atrophy and chronic microvascular ischemic changes. No acute/ traumatic cervical spine pathology. Electronically Signed   By: Elgie Collard M.D.   On: 06-07-16 03:38   Mr Maxine Glenn Head Wo Contrast  Result Date: 05/10/2016 CLINICAL DATA:  Acute left MCA infarct. EXAM: MRA NECK WITHOUT AND WITH CONTRAST MRA HEAD WITHOUT CONTRAST TECHNIQUE: Multiplanar and multiecho pulse  sequences of the neck were obtained without and with intravenous contrast. Angiographic images of the neck were obtained using MRA technique without and with intravenous contast.; Angiographic images of the Circle of Willis were obtained using MRA technique without intravenous contrast. CONTRAST:  14mL MULTIHANCE GADOBENATE DIMEGLUMINE 529 MG/ML IV SOLN COMPARISON:  None. FINDINGS: MRA NECK FINDINGS The study is moderately motion degraded mainly in the lower neck/ upper chest. Presumed standard 3 vessel aortic arch branching, though the origin of the left vertebral artery is not established on this study due to artifact. The brachiocephalic and subclavian arteries are grossly patent. However, motion artifact completely obscures the region of the distal brachiocephalic artery and proximal right common carotid artery. Artifact through this level also obscures a portion of the mid left common carotid artery as well as both proximal V1 segments. The portions of the common carotid arteries which are well-visualized are widely patent. The internal carotid arteries are patent bilaterally without evidence of significant stenosis. The distal right cervical ICA is tortuous. The vertebral arteries are patent and codominant. There is no evidence of flow limiting stenosis in the V2 or V3 segments. There are areas of mild-to-moderate narrowing versus artifact in the V3 segments. The V1 segments are not well evaluated. MRA HEAD FINDINGS The study is mildly motion degraded. The visualized distal vertebral arteries are patent to the basilar and codominant. Right PICA origin is patent. Left PICA is not identified, nor are AICAs. Basilar artery is widely patent. SCA origins are patent. There is a fetal type origin of the left PCA with absent left P1. The PCAs are patent without evidence of significant proximal stenosis. Internal carotid arteries are patent from skullbase to carotid termini without evidence of significant stenosis.  ACAs and right MCA are patent without evidence of major branch occlusion or flow limiting proximal stenosis. The left M1 segment is patent with at most mild narrowing distally. There is occlusion of the left M2 inferior division at its origin. There is mild reconstitution of flow and some more distal branches. Flow is present in the left M2 superior division, though it has an attenuated appearance. No intracranial aneurysm is identified. IMPRESSION: 1. Occlusion of the left M2 inferior division. Note that the acute infarct on yesterday's MRI is predominantly in the lateral lenticulostriate territory  however. 2. Motion degraded neck MRA with limited assessment of the large vessels in the upper chest and lower neck. No evidence of flow limiting cervical carotid or vertebral artery stenosis within this limitation. Electronically Signed   By: Sebastian Ache M.D.   On: 05/10/2016 08:08   Mr Maxine Glenn Neck W Wo Contrast  Result Date: 05/10/2016 CLINICAL DATA:  Acute left MCA infarct. EXAM: MRA NECK WITHOUT AND WITH CONTRAST MRA HEAD WITHOUT CONTRAST TECHNIQUE: Multiplanar and multiecho pulse sequences of the neck were obtained without and with intravenous contrast. Angiographic images of the neck were obtained using MRA technique without and with intravenous contast.; Angiographic images of the Circle of Willis were obtained using MRA technique without intravenous contrast. CONTRAST:  14mL MULTIHANCE GADOBENATE DIMEGLUMINE 529 MG/ML IV SOLN COMPARISON:  None. FINDINGS: MRA NECK FINDINGS The study is moderately motion degraded mainly in the lower neck/ upper chest. Presumed standard 3 vessel aortic arch branching, though the origin of the left vertebral artery is not established on this study due to artifact. The brachiocephalic and subclavian arteries are grossly patent. However, motion artifact completely obscures the region of the distal brachiocephalic artery and proximal right common carotid artery. Artifact through this  level also obscures a portion of the mid left common carotid artery as well as both proximal V1 segments. The portions of the common carotid arteries which are well-visualized are widely patent. The internal carotid arteries are patent bilaterally without evidence of significant stenosis. The distal right cervical ICA is tortuous. The vertebral arteries are patent and codominant. There is no evidence of flow limiting stenosis in the V2 or V3 segments. There are areas of mild-to-moderate narrowing versus artifact in the V3 segments. The V1 segments are not well evaluated. MRA HEAD FINDINGS The study is mildly motion degraded. The visualized distal vertebral arteries are patent to the basilar and codominant. Right PICA origin is patent. Left PICA is not identified, nor are AICAs. Basilar artery is widely patent. SCA origins are patent. There is a fetal type origin of the left PCA with absent left P1. The PCAs are patent without evidence of significant proximal stenosis. Internal carotid arteries are patent from skullbase to carotid termini without evidence of significant stenosis. ACAs and right MCA are patent without evidence of major branch occlusion or flow limiting proximal stenosis. The left M1 segment is patent with at most mild narrowing distally. There is occlusion of the left M2 inferior division at its origin. There is mild reconstitution of flow and some more distal branches. Flow is present in the left M2 superior division, though it has an attenuated appearance. No intracranial aneurysm is identified. IMPRESSION: 1. Occlusion of the left M2 inferior division. Note that the acute infarct on yesterday's MRI is predominantly in the lateral lenticulostriate territory however. 2. Motion degraded neck MRA with limited assessment of the large vessels in the upper chest and lower neck. No evidence of flow limiting cervical carotid or vertebral artery stenosis within this limitation. Electronically Signed   By:  Sebastian Ache M.D.   On: 05/10/2016 08:08   Mr Brain Wo Contrast  Result Date: 12-May-2016 CLINICAL DATA:  Fall with right-sided weakness. EXAM: MRI HEAD WITHOUT CONTRAST TECHNIQUE: Multiplanar, multiecho pulse sequences of the brain and surrounding structures were obtained without intravenous contrast. COMPARISON:  Head CT 2016/05/12 Brain MR 04/26/2016 FINDINGS: Brain: There are areas of diffusion restriction within the left caudate head, extending into the putamen; the left frontal operculum; and of the subinsular white matter. The  abnormality is in close proximity to the posterior limb of the internal capsule. There is also a possible focus of diffusion restriction within the left cerebral peduncle. No acute hemorrhage. There is beginning confluent hyperintense T2-weighted signal within the periventricular white matter, most often seen in the setting of chronic microvascular ischemia. No mass lesion or midline shift. No hydrocephalus or extra-axial fluid collection. The midline structures are normal. No age advanced or lobar predominant atrophy. Vascular: Major intracranial arterial and venous sinus flow voids are preserved. No evidence of chronic microhemorrhage or amyloid angiopathy. Skull and upper cervical spine: The visualized skull base, calvarium, upper cervical spine and extracranial soft tissues are normal. Sinuses/Orbits: Bilateral mastoid effusions. Paranasal sinuses are clear. Normal orbits. IMPRESSION: 1. Acute infarct within the left subinsular white matter, left frontal operculum and left basal ganglia (predominantly the caudate head and putamen). No hemorrhage, midline shift or mass effect. 2. Punctate focus of possible ischemia within the left cerebral peduncle. 3. Chronic microvascular disease. 4. Bilateral mastoid effusions. Electronically Signed   By: Deatra Robinson M.D.   On: 05/13/2016 05:39   Mr Brain Wo Contrast  Result Date: 04/26/2016 CLINICAL DATA:  Worsening hearing loss over  the past 4-5 days. Stroke last month. EXAM: MRI HEAD WITHOUT CONTRAST TECHNIQUE: Multiplanar, multiecho pulse sequences of the brain and surrounding structures were obtained without intravenous contrast. COMPARISON:  04/10/2016 FINDINGS: Brain: Small volume blood products are again seen in the mesial left temporal lobe extending to the region of inferior basal ganglia/ genu of the left internal capsule. Minimal residual diffusion signal abnormality is present in these locations related to the blood products. Abnormal trace diffusion signal elsewhere in the left greater than right cerebral hemispheres and left cerebellum on the prior study has resolved. There is minimal T2/FLAIR hyperintensity in the mesial left temporal lobe/ basal ganglia region and left temporal occipital region corresponding to the previously demonstrated infarcts. There is no evidence of acute infarct, new intracranial hemorrhage, mass, midline shift, or extra-axial fluid collection. Bilateral periventricular white matter T2 hyperintensities are unchanged and nonspecific but compatible with mild chronic small vessel ischemic disease. There is moderate cerebral atrophy. Vascular: Major intracranial vascular flow voids are preserved. Skull and upper cervical spine: Unremarkable bone marrow signal. Sinuses/Orbits: Unremarkable orbits. Minimal posterior right ethmoid air cell mucosal thickening. Large bilateral mastoid effusions, increased from prior. Fluid is now present in the middle ear cavities bilaterally. Other: None. IMPRESSION: 1. Residual small volume blood products in the mesial left temporal lobe and left internal capsule/inferior basal ganglia region associated with subacute infarcts demonstrated on the prior MRI. 2. Resolved diffusion abnormality associated with the small infarcts elsewhere. 3. No acute infarct or new intracranial abnormality. 4. Large bilateral mastoid and middle ear effusions. Electronically Signed   By: Sebastian Ache  M.D.   On: 04/26/2016 19:03   Dg Chest Port 1 View  Result Date: 05/18/2016 CLINICAL DATA:  Patient with aspiration. EXAM: PORTABLE CHEST 1 VIEW COMPARISON:  Chest radiograph 05/15/2016. FINDINGS: Stable cardiac and mediastinal contours with tortuosity and calcification of the thoracic aorta. Scarring within the right-greater-than-left lower lungs. No large area of pulmonary consolidation. No pleural effusion or pneumothorax. IMPRESSION: No acute cardiopulmonary process. Basilar atelectasis and/or scarring. Electronically Signed   By: Annia Belt M.D.   On: 05/18/2016 16:33   Dg Chest Port 1 View  Result Date: 05/15/2016 CLINICAL DATA:  Short of breath. EXAM: PORTABLE CHEST 1 VIEW COMPARISON:  05/10/2016 FINDINGS: Cardiac silhouette is normal in size. No mediastinal  or hilar masses. No convincing adenopathy. Linear and reticular opacities are noted at the lung bases, similar to the prior study, consistent with a combination scarring and subsegmental atelectasis. Lung base and perihilar chronic bronchitic changes also stable. There is no evidence of pneumonia or pulmonary edema. No convincing pleural effusion. No pneumothorax. Skeletal structures are demineralized but grossly intact. IMPRESSION: 1. No acute cardiopulmonary disease. 2. Stable appearance from the prior study. Electronically Signed   By: Amie Portland M.D.   On: 05/15/2016 17:08   Dg Chest Port 1 View  Result Date: 2016-05-31 CLINICAL DATA:  Shortness of Breath following recent fall EXAM: PORTABLE CHEST 1 VIEW COMPARISON:  31-May-2016 FINDINGS: Cardiac shadow is stable. Bibasilar atelectatic changes are noted slightly more prominent than that seen on the prior exam. No pneumothorax or sizable effusion is seen. No bony abnormality is noted. IMPRESSION: Mild bibasilar atelectasis. Electronically Signed   By: Alcide Clever M.D.   On: 31-May-2016 21:56   Dg Chest Port 1 View  Result Date: 04/23/2016 CLINICAL DATA:  Aspiration pneumonia EXAM:  PORTABLE CHEST 1 VIEW COMPARISON:  04/23/2016 at 12:20 FINDINGS: Mild streaky opacity in the right days. Left lung is clear. Pulmonary vasculature is normal. No effusions. Hilar, mediastinal and cardiac contours are unremarkable and unchanged. IMPRESSION: Streaky right base opacity. No focal confluent consolidation. No significant change in the interim. Electronically Signed   By: Ellery Plunk M.D.   On: 04/23/2016 23:41   Dg Chest Port 1 View  Result Date: 04/23/2016 CLINICAL DATA:  COPD, aspiration pneumonia. EXAM: PORTABLE CHEST 1 VIEW COMPARISON:  Portable chest x-ray of Sep 20, 2015 FINDINGS: The lungs are mildly hyperinflated. The interstitial markings are coarse. Increased density at the right lung base is consistent with the clinically suspected aspiration pneumonia. The heart and pulmonary vascularity are normal. There is calcification in the wall of the tortuous thoracic aorta. There is no pleural effusion. The bony thorax exhibits no acute abnormality. IMPRESSION: Right basilar pneumonia consistent with aspiration. COPD. Thoracic aortic atherosclerosis. When the patient can tolerate the procedure, a PA and lateral chest x-ray would be useful. Electronically Signed   By: David  [place] M.D.   On: 04/23/2016 12:34   Dg Chest Port 1 View  Result Date: 04/21/2016 CLINICAL DATA:  Initial evaluation for acute respiratory distress, wheezing. History of COPD. EXAM: PORTABLE CHEST 1 VIEW COMPARISON:  Prior radiograph from 04/21/2016. FINDINGS: Cardiac and mediastinal silhouettes are stable in size and contour, and remain within normal limits. Atherosclerotic disease noted within the aortic arch. Lungs are hypoinflated. Changes related COPD noted. Increased right basilar opacity favored to reflect atelectasis/bronchovascular crowding, although recurrent infiltrate not excluded. Left basilar atelectasis noted. Small left pleural effusion. No pulmonary edema or pneumothorax. No acute osseous abnormality.  IMPRESSION: 1. Shallow lung inflation with increased right basilar opacity, favored to reflect atelectasis/ bronchovascular crowding, although recurrent infiltrate could be considered in the correct clinical setting. 2. Small left pleural effusion with associated atelectasis. 3. COPD. Electronically Signed   By: Rise Mu M.D.   On: 04/21/2016 20:38   Dg Chest Port 1 View  Result Date: 04/21/2016 CLINICAL DATA:  Follow-up wheezing, COPD. EXAM: PORTABLE CHEST 1 VIEW COMPARISON:  04/20/2016 FINDINGS: There is hyperinflation of the lungs compatible with COPD. Heart and mediastinal contours are within normal limits. No focal opacities or effusions. No acute bony abnormality. IMPRESSION: COPD.  No active disease. Electronically Signed   By: Charlett Nose M.D.   On: 04/21/2016 08:27   Dg Chest  Port 1 View  Result Date: 04/20/2016 CLINICAL DATA:  Pneumonia. EXAM: PORTABLE CHEST 1 VIEW COMPARISON:  04/16/2016. FINDINGS: Mediastinum and hilar structures are normal. Heart size stable. Low lung volumes with basilar atelectasis. Slight improvement of right base infiltrate. A component of right base bronchiectasis cannot be excluded. Tiny left pleural effusion. No pneumothorax. IMPRESSION: 1. Interim partial clearing of right base infiltrate. A component of bronchiectasis in the right base cannot be excluded. 2. Low lung volumes.  Small left pleural effusion noted. Electronically Signed   By: Maisie Fus  Register   On: 04/20/2016 07:20   Dg Chest Port 1v Same Day  Result Date: 05/10/2016 CLINICAL DATA:  Dyspnea, history COPD, stroke EXAM: PORTABLE CHEST 1 VIEW COMPARISON:  Portable exam 0932 hours compared to 04/25/2016 FINDINGS: Upper normal heart size. Rotated to the RIGHT. Mediastinal contours and pulmonary vascularity normal. Atherosclerotic calcification aorta. Bronchitic changes with bibasilar atelectasis. Remaining lungs clear. No pleural effusion or pneumothorax. IMPRESSION: Bronchitic changes with  bibasilar atelectasis. Aortic atherosclerosis. Electronically Signed   By: Ulyses Southward M.D.   On: 05/10/2016 09:53   Dg Knee Complete 4 Views Right  Result Date: 04/20/2016 CLINICAL DATA:  83 year old female with fall and right knee swelling. EXAM: RIGHT KNEE - COMPLETE 4+ VIEW COMPARISON:  None. FINDINGS: No acute fracture or dislocation. The bones are osteopenic. There is meniscal chondrocalcinosis with mild osteoarthritic changes of the knees. No joint effusion. The soft tissues appear unremarkable. IMPRESSION: No acute fracture or dislocation. Electronically Signed   By: Elgie Collard M.D.   On: 04/29/2016 03:04    Lab Data:  CBC:  Recent Labs Lab 05/13/16 0334 05/16/16 0517 05/19/16 0611  WBC 10.9* 9.9 PENDING  HGB 10.9* 11.4* 12.1  HCT 34.9* 36.5 39.5  MCV 87.0 87.3 90.6  PLT 217 309 427*   Basic Metabolic Panel:  Recent Labs Lab 05/15/16 2013 05/16/16 0517 05/17/16 0639 05/18/16 0231 05/19/16 0611  NA 142 146* 144 146* 144  K 3.2* 3.3* 4.2 4.3 5.2*  CL 105 107 108 109 106  CO2 22 26 28 27 29   GLUCOSE 108* 107* 157* 160* 151*  BUN 21* 18 23* 32* 35*  CREATININE 0.70 0.67 0.55 0.60 0.62  CALCIUM 7.5* 7.3* 7.5* 7.9* 7.8*  MG 2.0 2.4  --  2.9*  --    GFR: Estimated Creatinine Clearance: 47.8 mL/min (by C-G formula based on SCr of 0.62 mg/dL). Liver Function Tests:  Recent Labs Lab 05/16/16 0517  AST 20  ALT 13*  ALKPHOS 54  BILITOT 0.7  PROT 6.9  ALBUMIN 2.5*   No results for input(s): LIPASE, AMYLASE in the last 168 hours. No results for input(s): AMMONIA in the last 168 hours. Coagulation Profile: No results for input(s): INR, PROTIME in the last 168 hours. Cardiac Enzymes: No results for input(s): CKTOTAL, CKMB, CKMBINDEX, TROPONINI in the last 168 hours. BNP (last 3 results) No results for input(s): PROBNP in the last 8760 hours. HbA1C: No results for input(s): HGBA1C in the last 72 hours. CBG: No results for input(s): GLUCAP in the last  168 hours. Lipid Profile: No results for input(s): CHOL, HDL, LDLCALC, TRIG, CHOLHDL, LDLDIRECT in the last 72 hours. Thyroid Function Tests:  Recent Labs  05/17/16 0644  TSH 1.201   Anemia Panel: No results for input(s): VITAMINB12, FOLATE, FERRITIN, TIBC, IRON, RETICCTPCT in the last 72 hours. Urine analysis:    Component Value Date/Time   COLORURINE YELLOW 05/03/2016 0419   APPEARANCEUR HAZY (A) 04/17/2016 0419   LABSPEC  1.020 05/14/2016 0419   PHURINE 5.0 05/05/2016 0419   GLUCOSEU NEGATIVE 04/30/2016 0419   HGBUR NEGATIVE 05/08/2016 0419   BILIRUBINUR NEGATIVE 04/29/2016 0419   KETONESUR NEGATIVE 05/01/2016 0419   PROTEINUR NEGATIVE 04/20/2016 0419   NITRITE NEGATIVE 04/17/2016 0419   LEUKOCYTESUR NEGATIVE 04/18/2016 0419   Time spent:  Demetrion Wesby M.D. PhD [facility] 05/19/2016, 7:56 AM   Between 7am to 7pm - call Pager - [phone]  After 7pm go to [email or web address] - password [email or web address]  Call night coverage person covering after 7pm

## 2016-05-20 ENCOUNTER — Inpatient Hospital Stay (HOSPITAL_COMMUNITY): Payer: Medicare Other

## 2016-05-20 DIAGNOSIS — I69391 Dysphagia following cerebral infarction: Secondary | ICD-10-CM

## 2016-05-20 DIAGNOSIS — I639 Cerebral infarction, unspecified: Secondary | ICD-10-CM

## 2016-05-20 LAB — CBC
HEMATOCRIT: 39.7 % (ref 36.0–46.0)
Hemoglobin: 12.2 g/dL (ref 12.0–15.0)
MCH: 27.6 pg (ref 26.0–34.0)
MCHC: 30.7 g/dL (ref 30.0–36.0)
MCV: 89.8 fL (ref 78.0–100.0)
PLATELETS: 412 10*3/uL — AB (ref 150–400)
RBC: 4.42 MIL/uL (ref 3.87–5.11)
RDW: 16.2 % — AB (ref 11.5–15.5)
WBC: 13.4 10*3/uL — ABNORMAL HIGH (ref 4.0–10.5)

## 2016-05-20 LAB — BASIC METABOLIC PANEL
Anion gap: 9 (ref 5–15)
BUN: 43 mg/dL — AB (ref 6–20)
CALCIUM: 8.1 mg/dL — AB (ref 8.9–10.3)
CO2: 26 mmol/L (ref 22–32)
Chloride: 110 mmol/L (ref 101–111)
Creatinine, Ser: 0.58 mg/dL (ref 0.44–1.00)
GFR calc Af Amer: >60 mL/min (ref >60)
GLUCOSE: 180 mg/dL — AB (ref 65–99)
POTASSIUM: 5.2 mmol/L — AB (ref 3.5–5.1)
Sodium: 145 mmol/L (ref 135–145)

## 2016-05-20 MED ORDER — CLOPIDOGREL BISULFATE 75 MG PO TABS: 75.0000 mg | ORAL_TABLET | Freq: Every day | ORAL | Status: DC

## 2016-05-20 MED ORDER — DEXTROSE 5 % IV SOLN
1.0000 g | INTRAVENOUS | Status: DC
  Administered 2016-05-20 – 2016-05-21 (×2): 1 g via INTRAVENOUS
  Filled 2016-05-20 (×2): qty 10

## 2016-05-20 MED ORDER — STARCH (THICKENING) PO POWD
ORAL | Status: DC | PRN
  Filled 2016-05-20: qty 227

## 2016-05-20 MED ORDER — CARVEDILOL 3.125 MG PO TABS
3.1250 mg | ORAL_TABLET | Freq: Two times a day (BID) | ORAL | Status: DC
  Administered 2016-05-20 – 2016-05-21 (×3): 3.125 mg via ORAL
  Filled 2016-05-20 (×3): qty 1

## 2016-05-20 MED ORDER — SODIUM POLYSTYRENE SULFONATE 15 GM/60ML PO SUSP
30.0000 g | Freq: Once | ORAL | Status: AC
  Administered 2016-05-20: 30 g via ORAL
  Filled 2016-05-20 (×2): qty 120

## 2016-05-20 MED ORDER — SODIUM CHLORIDE 0.9 % IV SOLN
INTRAVENOUS | Status: DC
  Administered 2016-05-20 (×2): via INTRAVENOUS

## 2016-05-20 MED ORDER — DEXTROSE 5 % IV SOLN
500.0000 mg | INTRAVENOUS | Status: DC
  Administered 2016-05-20 – 2016-05-21 (×2): 500 mg via INTRAVENOUS
  Filled 2016-05-20 (×2): qty 500

## 2016-05-20 NOTE — Progress Notes (Signed)
Calorie Count Follow Up  48 hour calorie count ordered  Diet: Dysphagia 1, honey thick liquids Supplements: Ensure Enlive BID  Per meal tickets available: Dinner 1/2: 150 kcals, 5 gm protein Breakfast 1/3: 300 kcals, 10 gm protein  Spoke with RN who reports it took 45 minutes to feed patient with suctioning x 2.  No supplements (Ensure Enlive) taken.  Total intake:  450 kcal (30% of minimum estimated needs)   15 protein (20% of minimum estimated needs)  Nutrition Dx: Inadequate oral intake related to dysphagia (decreased alertness) as evidenced by meal completion < 50%, ongoing  Goal: Pt to meet >/= 90% of their estimated nutrition needs vs comfort feeds, pending  Intervention:    D/C calorie count  Maureen ChattersKatie Rawn Quiroa, RD, LDN Pager #: [phone] After-Hours Pager #: [phone]

## 2016-05-20 NOTE — Progress Notes (Signed)
VASCULAR LAB PRELIMINARY  ARTERIAL  ABI completed: Unable to obtain right brachial pressure due to IV. Unable to obtain right pedal pressures due to bandaged wound on ankle. Unable to obtain toe pressures due to low amplitude waveforms. Left ABI of 1.19 is suggestive of arterial flow within normal limits at rest. Right sided waveforms obtained in the dorsalis pedis and posterior tibial arteries demonstrate biphasic flow.   RIGHT    LEFT    PRESSURE WAVEFORM  PRESSURE WAVEFORM  BRACHIAL IV Biphasic BRACHIAL 164 Biphasic  DP Wound Biphasic DP 182 Biphasic  PT Wound Biphasic PT 195 Biphasic    RIGHT LEFT  ABI  1.19     Elsie StainGregory J Deeana Atwater, RVT 05/20/2016, 3:45 PM

## 2016-05-20 NOTE — Progress Notes (Signed)
STROKE TEAM PROGRESS NOTE   SUBJECTIVE (INTERVAL HISTORY) No family at bedside. She still in best distress, however, since slightly better than yesterday. More awake alert, able to answer very simple questions. Family decided PEG tube placement. Will DC Plavix. However, due to significant comorbidities, recommend 30 day cardiac event monitoring instead of droop recorder.   OBJECTIVE Temp:  [97 F (36.1 C)-99.2 F (37.3 C)] 97.9 F (36.6 C) (01/03 0805) Pulse Rate:  [73-95] 73 (01/03 0805) Cardiac Rhythm: Normal sinus rhythm (01/03 0730) Resp:  [18-24] 22 (01/03 0805) BP: (135-186)/(55-98) 174/81 (01/03 0916) SpO2:  [97 %-100 %] 100 % (01/03 0805)  CBC:   Recent Labs Lab 05/19/16 0611 05/20/16 0200  WBC 12.9* 13.4*  HGB 12.1 12.2  HCT 39.5 39.7  MCV 90.6 89.8  PLT 427* 412*    Basic Metabolic Panel:   Recent Labs Lab 05/16/16 0517  05/18/16 0231 05/19/16 0611 05/20/16 0200  NA 146*  < > 146* 144 145  K 3.3*  < > 4.3 5.2* 5.2*  CL 107  < > 109 106 110  CO2 26  < > 27 29 26   GLUCOSE 107*  < > 160* 151* 180*  BUN 18  < > 32* 35* 43*  CREATININE 0.67  < > 0.60 0.62 0.58  CALCIUM 7.3*  < > 7.9* 7.8* 8.1*  MG 2.4  --  2.9*  --   --   < > = values in this interval not displayed.  Lipid Panel:     Component Value Date/Time   CHOL 147 05/11/2016 0202   TRIG 95 05/11/2016 0202   HDL 42 05/11/2016 0202   CHOLHDL 3.5 05/11/2016 0202   VLDL 19 05/11/2016 0202   LDLCALC 86 05/11/2016 0202   HgbA1c:  Lab Results  Component Value Date   HGBA1C 5.8 (H) 04/10/2016    IMAGING I have personally reviewed the radiological images below and agree with the radiology interpretations.  Ct Head Wo Contrast 05/10/2016 1. Evolving left MCA distribution subacute infarct. 2. Chronic mastoid fluid bilaterally.  05-26-16 No acute intracranial hemorrhage.  Focal area of hypodensity extending from the lateral aspect of the left lateral ventricle inferiorly appears new since  the prior CT and may represent a subacute or less likely an acute infarct.  Correlation with clinical exam recommended. MRI is recommended if there is clinical concern for acute infarct.  Moderate age-related atrophy and chronic microvascular ischemic changes.  No acute/ traumatic cervical spine pathology.   Mri / Mra Head and Neck Wo Contrast 05/10/2016 1. Occlusion of the left M2 inferior division.  Note that the acute infarct on yesterday's MRI is predominantly in the lateral lenticulostriate territory however.  2. Motion degraded neck MRA with limited assessment of the large vessels in the upper chest and lower neck.  No evidence of flow limiting cervical carotid or vertebral artery stenosis within this limitation.   Mri Brain Wo Contrast May 26, 2016 1. Acute infarct within the left subinsular white matter, left frontal operculum and left basal ganglia (predominantly the caudate head and putamen). No hemorrhage, midline shift or mass effect.  2. Punctate focus of possible ischemia within the left cerebral peduncle.  3. Chronic microvascular disease.  4. Bilateral mastoid effusions.   EEG is abnormal due to the presence of moderate diffuse slowing of the background with additional focal slowing over the left hemisphere  LE venous doppler  - No evidence of deep vein thrombosis involving the right lower   extremity and left lower  extremity. - No evidence of Baker&'s cyst on the right or left.  LE ABI  Unable to obtain right brachial pressure due to IV. Unable to obtain right pedal pressures due to bandaged wound on ankle. Unable to obtain toe pressures due to low amplitude waveforms. Left ABI of 1.19 is suggestive of arterial flow within normal limits at rest. Right sided waveforms obtained in the dorsalis pedis and posterior tibial arteries demonstrate biphasic flow.   PHYSICAL EXAM  Temp:  [97 F (36.1 C)-99.2 F (37.3 C)] 97.9 F (36.6 C) (01/03 0805) Pulse Rate:  [73-95]  73 (01/03 0805) Resp:  [18-24] 22 (01/03 0805) BP: (135-186)/(55-98) 174/81 (01/03 0916) SpO2:  [97 %-100 %] 100 % (01/03 0805)  General - Well nourished, well developed, in significant respiratory distress.  Ophthalmologic - Fundi not visualized due to distress.  Cardiovascular - Regular rate and rhythm.  Extremities - b/l anterior feet purple color and cold  Neuro - limited neuro exam due to respiratory distress. Pt able to tell me her name, and age, but not able to answer other questions or repeat sentences due to respiratory distress. Able to follow limited simple commands due to respiratory distress. Eyes open, PERRL, able to attend on both sides, facial symmetrical. Visual field intact. RUE 0/5. Right LE 2/5 on pain stimulation. LUE 4-/5 and LLE 3/5 on pain stimulation. No babinski and DTR 1+. Sensation, coordination and gait not tested       ASSESSMENT/PLAN [name] is a 83 y.o. female with history of COPD, recent left MCA stroke with left carotid mechanical embolectomy in 03/2016 discharged with ASA, admitted again on 01-16-16 due to fall at SNF, She was found to have a L MCA embolic infarct, source unknown.  She did not receive IV t-PA due to unknown time of onset. She was followed by the stroke team, put back on ASA and signed off 12/26. We were asked to reconsult 1/2 to consider stroke management due to b/l feet ischemic changes and concerning for embolic events.  Stroke:  Dominant L MCA infarct embolic - source unknown  Resultant  Dense right hemiparesis, dysphagia  MRI - acute infarct within the left subinsular white matter, left frontal operculum and left basal ganglia  MRA head and neck - occlusion of the left M2 inferior division.   Repeat CT head evolving L MCA infarct. No new abnormality  TEE 04/13/2016 - EF 45 - 50%. No cardiac source of emboli identified.   LDL - 04/10/2016 - 86  HgbA1c - 04/10/2016 - 5.8  VTE prophylaxis - on lovenox  DIET - DYS 1 Room  service appropriate? Yes; Fluid consistency: Honey Thick  No antithrombotic prior to admission, now on aspirin 300 mg suppository daily. Continue aspirin alone in preparation of PEG tube placement. After PEG tube placement, due to intracranial stenosis (left MCA), will recommend DAPT with ASA 325mg  and plavix 75mg  for 3 months and then plavix alone.   Recommend 30 day cardiac event monitoring as outpatient to rule out afib.  Patient counseled to be compliant with her antithrombotic medications  Therapy recommendations: pending   Disposition:  Anticipate return to SNF  Hypertension  Stable  BP goal normotensive  Hyperlipidemia  Home meds:  Lipitor 40 mg daily   LDL 86, goal < 70  Resumed Lipitor   Continue statin at discharge  Other Stroke Risk Factors  Advanced age  Obesity, Body mass index is 30.18 kg/m., recommend weight loss, diet and exercise as appropriate  Hx stroke/TIA -  left carotid mechanical embolectomy in 03/2016   Chronic combined systolid and diastolic CHF  Other Active Problems  Acute Encephalopathy (not associated with stroke)  Mild leukocytosis 12.9 (afebrile)  COPD w/ hypoxic respiratory failure on home O2  Aspiration PNA  NSVT  Hospital day # 11  Neurology will sign off. Please call with questions. Pt will follow up with Dr. Pearlean Brownie at [facility] in about 6 weeks. Thanks for the consult.  Marvel Plan, MD PhD Stroke Neurology 05/20/2016 5:45 PM   To contact Stroke Continuity provider, please refer to [email or web address]. After hours, contact General Neurology

## 2016-05-20 NOTE — Progress Notes (Signed)
  Order seen for gastrostomy tube placement  Patient started on Plavix on 05/19/2016 Per our protocol, this will need to be held x 5 days prior to Gastrostomy placement.  Will obtain CT scan to evaluate abdomen Will plan for Gastrostomy on Monday 05/25/16  [facility] Limited PartnershipWENDY S BLAIR PA-C 05/20/2016 4:32 PM

## 2016-05-20 NOTE — Progress Notes (Signed)
*  Preliminary Results* Bilateral lower extremity venous duplex completed. Bilateral lower extremities are negative for deep vein thrombosis. There is no evidence of Baker's cyst bilaterally.  05/20/2016 11:29 AM Gertie FeyMichelle Elaina Cara, BS, RVT, RDCS, RDMS

## 2016-05-20 NOTE — Progress Notes (Signed)
NT suctioned x 3 this am for rhonchi and noisy respirations at 30 - last time obtained a full catheter  of thick tan secretions with small amt bloody secretions. Using Rt nares. Was able to feed pt total with supervision 2 scrambled eggs and 1 magic cup over 1 hours time. Pt still swallows well just takes much time. Has honey thick liquids ordered Thick it for bedside use.

## 2016-05-20 NOTE — Progress Notes (Addendum)
[facility]                                                                              Patient Demographics  Catherine Day, is a 83 y.o. female, DOB - [birth year], WUJ:[id number]  Admit date - 05-20-16   Admitting Physician Haydee Salter, MD  Outpatient Primary MD for the patient is No primary care provider on file.  Outpatient specialists:   LOS - 11  days    Chief Complaint  Patient presents with  . Fall       Brief summary   Patient is a 83 year old female with recent protracted hospitalization for left-sided embolic stroke s/p TPA and revascularization in 03/2016, COPD, O2 dependent, prior hospitalization course complicated by ESBL positive Escherichia coli UTI and aspiration pneumonia. She was discharged to skilled nursing facility on 12//11.  Patient was sent to ER from SNF on 12/23 after a fall. Per ER notes the patient fell about 11:30 PM and at that time it was discovered that the patient was not moving the right side as per previous baseline. MRI showed acute infarcts within the left subinsular white matter, left frontal operculum the left basal ganglia with an area of punctate possible ischemia in the left cerebral peduncle.   Neurology consulted recommended asa 325 and consider loop recorder if family does not choose comfort measures , neurology signed off on 12/28.   Patient was minimal responsive, comfort measures considered initially, from 12/28 patient started to be awake and able to eat and follow commands, now family is thinking about possible feeding tube and snf placement.  Patient continue to have upper airway sounds, though cxr and abg unremakable.  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  venous doppler/abi reporting negative for DVT   Assessment & Plan    Acute CVA in the setting of Recent Embolic stroke (HCC) - B MCA and L  cerebellar/Cerebrovascular accident (CVA) due to bilateral thrombosis of vertebral arteries  -Patient recently admitted for acute stroke requiring tPA as well as complete revascularization of occluded left MCA, left ACA and left ICA terminus in interventional radiology. Baseline deficits prior to this hospitalization included continued right lower extremity weakness with associated gait disturbance and mild to moderate right upper extremity weakness. -Patient now admitted with right-sided hemiparesis, flaccid with findings of new left side multiple area strokes on MRI, also has expressive aphasia - MRA of the brain showed occlusion of the left M2 division, acute infarct on MRIs predominantly in the lateral lenticular striate territory.  -Underwent TEE previous admission with no embolic source found; no indication to repeat echocardiogram this admission -Full stroke evaluation last admission so no indication to repeat carotid duplex, hemoglobin A1c or lipid panel this admission -- she initially failed swallow eval, not interactive, she was on npo , getting rectal asa, Repeat CT head showed evolving left MCA distribution subacute infarct, initially patient did not show any meaningful clinical improvement, palliative care consulted, and comfort care was considered - she showed drastic improvement on 12/28, smiling, interactive and follow command,  Passed swallow  eval with dysphagia diet (puree, honey thick liquid), continue ivf for another 24hrs then d/c if consistent oral intake. -Neurology consulted and signed off on 12/28, reconsulted neurology on 1/2 in regarding antiplt or anticoagulation therapy due to concerning for embolic symdrom ( bilateral purple feet) -still drowsy most of the day, with inconsistent  oral intake, Nutrition consulted for Calorie counting,  -family is in the process of making decision about feeding tube , palliative care and neurology following   Acute encephalopathy with hypoxic  respiratory failure, aspiration pneumonia, dysphagia, aspirating -- IV Zosyn started on 12/24, Will place back on IV antibiotics given concern for aspiration. - started to improved from 12/28, but mental status still fluctuating, concerns about silent aspiration -drowsy, poor oral intake, may need feeding tube   Bilateral purple feet started on 1/2 Unclear etiology , concerning for embolic phenomenon  Blood culture, venous doppler negative for DVT and abi pending  Audible gurgling cxr unremarkable, abg unremarkable I am concerned that patient is not able to clear her secretion effectively, need frequent suction. Will add chest physiotherapy     Chronic combined systolic and diastolic CHF, NYHA class 1  --TEE previous admission revealed EF of 45-50% with grade 1 diastolic dysfunction -home lasix held initially due to dry, npo -bp start to increase, has wheezing on 12/29, one dose iv lasix, d/c ivf, change iv abx to oral , stat cxr no acute findings on 12/29 -inconsistence oral intake, will place on IVF's  NSVT: will check tsh, keep k>4, mag>2, started coreg and titrate up as needed.   Hypertension -Allow for permissive hypertension initially, gradually restart home bp meds diovan, low dose coreg started on 12/30 due to NSVT -iv lasix on 12/29 - Will decrease coreg dose    chronic hypoxic respiratory failure on home o2 2liter / Chronic obstructive pulmonary disease with chronic respiratory failure - on DuoNeb's -wheezing persistently , continue nebs ,abx, add steroids Taper steroids    Hyperlipidemia -Holding Lipitor while NPO  FTT: palliative care input appreciated   Code Status: DNR DVT Prophylaxis:  Lovenox  Family Communication: patient    Disposition Plan:   May need feeding tube placement,  Time Spent in minutes   35 minutes  Procedures:  MRI brain MRA brain   Consultants:   Neurology  Palliative care  Antimicrobials:   IV Zosyn 12/24 to  12/29  augmentin from 12/29   Medications  Scheduled Meds: .  stroke: mapping our early stages of recovery book   Does not apply Once  . aspirin  300 mg Rectal Daily   Or  . aspirin  325 mg Oral Daily  . atorvastatin  40 mg Oral q1800  . azithromycin  500 mg Intravenous Q24H  . carvedilol  12.5 mg Oral BID WC  . cefTRIAXone (ROCEPHIN)  IV  1 g Intravenous Q24H  . clopidogrel  75 mg Oral Daily  . enoxaparin (LOVENOX) injection  40 mg Subcutaneous Q24H  . fluticasone furoate-vilanterol  1 puff Inhalation Daily  . guaiFENesin  600 mg Oral BID  . ipratropium-albuterol  3 mL Nebulization TID  . methylPREDNISolone (SOLU-MEDROL) injection  60 mg Intravenous Q12H  . nystatin   Topical TID  . sodium polystyrene  30 g Oral Once   Continuous Infusions: . sodium chloride 50 mL/hr at 05/20/16 1415   PRN Meds:.acetaminophen **OR** acetaminophen (TYLENOL) oral liquid 160 mg/5 mL **OR** acetaminophen, food thickener, hydrALAZINE, ipratropium-albuterol, senna-docusate   Antibiotics   Anti-infectives    Start  Dose/Rate Route Frequency Ordered Stop   05/20/16 1500  azithromycin (ZITHROMAX) 500 mg in dextrose 5 % 250 mL IVPB     500 mg 250 mL/hr over 60 Minutes Intravenous Every 24 hours 05/20/16 1413     05/20/16 1500  cefTRIAXone (ROCEPHIN) 1 g in dextrose 5 % 50 mL IVPB     1 g 100 mL/hr over 30 Minutes Intravenous Every 24 hours 05/20/16 1421     05/15/16 2200  amoxicillin-clavulanate (AUGMENTIN) 875-125 MG per tablet 1 tablet  Status:  Discontinued     1 tablet Oral Every 12 hours 05/15/16 1634 05/20/16 1413   05/10/16 0900  piperacillin-tazobactam (ZOSYN) IVPB 3.375 g  Status:  Discontinued     3.375 g 12.5 mL/hr over 240 Minutes Intravenous Every 8 hours 05/10/16 0859 05/15/16 1633        Subjective:   Jezebel Pollet has been somnolent. Per nursing not taking appropriate oral intake.   Objective:   Vitals:   05/20/16 1114 05/20/16 1200 05/20/16 1300 05/20/16 1400  BP:  (!) 145/77 (!) 128/103 (!) 183/79 (!) 145/38  Pulse: 90 91 92 (!) 51  Resp: (!) 24 (!) 25 (!) 24   Temp: 98.2 F (36.8 C)     TempSrc: Axillary     SpO2: 100% 100% 98% 100%  Weight:      Height:        Intake/Output Summary (Last 24 hours) at 05/20/16 1430 Last data filed at 05/20/16 1130  Gross per 24 hour  Intake              200 ml  Output                0 ml  Net              200 ml     Wt Readings from Last 3 Encounters:  May 11, 2016 74.8 kg (165 lb)  04/27/16 74.1 kg (163 lb 5.8 oz)  04/14/16 91.2 kg (201 lb)     Exam  General: awake, audible gurgling,   Neck: Supple, no JVD  Cardiovascular: S1 S2 clear, RRR,   Respiratory:  Audible gurgling, no wheezes  Gastrointestinal: Soft, nontender, nondistended, + bowel sounds  Ext: trace pitting edema lower extremity  has resolved  Neuro:  right hemiparesis, awake  Skin: purple color bilateral plantar surface and toes, palpable pedal pulse   Data Reviewed:  I have personally reviewed following labs and imaging studies  Micro Results Recent Results (from the past 240 hour(s))  MRSA PCR Screening     Status: Abnormal   Collection Time: 05/10/16  3:51 PM  Result Value Ref Range Status   MRSA by PCR POSITIVE (A) NEGATIVE Final    Comment:        The GeneXpert MRSA Assay (FDA approved for NASAL specimens only), is one component of a comprehensive MRSA colonization surveillance program. It is not intended to diagnose MRSA infection nor to guide or monitor treatment for MRSA infections. RESULT CALLED TO, READ BACK BY AND VERIFIED WITH: Fayrene Fearing RN AT 1308 05/10/16 BY [facility]     Radiology Reports Dg Chest 2 View  Result Date: 05/11/16 CLINICAL DATA:  83 year old female with fall.  History of COPD. EXAM: CHEST  2 VIEW COMPARISON:  Chest radiograph dated 06/25/2015 FINDINGS: There is shallow inspiration. Bibasilar streaky densities most compatible with atelectatic changes/ scarring. Infiltrate is less  likely. No pleural effusion or pneumothorax. Stable cardiac silhouette. The aorta is tortuous. There is osteopenia  with degenerative changes of the spine. No acute osseous pathology. IMPRESSION: Shallow inspiration with bibasilar atelectatic changes. Infiltrate is less likely. Electronically Signed   By: Elgie Collard M.D.   On: May 22, 2016 03:05   Ct Head Wo Contrast  Result Date: 05/10/2016 CLINICAL DATA:  Acute encephalopathy. Recent left MCA distribution infarct. EXAM: CT HEAD WITHOUT CONTRAST TECHNIQUE: Contiguous axial images were obtained from the base of the skull through the vertex without intravenous contrast. COMPARISON:  MRI of 05/21/2016.  CT 06/01/2016. FINDINGS: Brain: Expected cerebral volume loss for age. Moderate low density in the periventricular white matter likely related to small vessel disease. Evolution of left MCA distribution infarct, slightly more well-defined today. Examples in the lateral left basal ganglia, subinsular region, and extending into the periventricular white matter of the left frontal lobe. No significant mass effect or midline shift. No complicating hemorrhage, hydrocephalus, intra-axial, or extra-axial fluid collection. Vascular: Intracranial carotid atherosclerosis. Skull: Normal Sinuses/Orbits: Normal orbits and globes. Minimal ethmoid air cell mucosal thickening. Chronic bilateral mastoid effusions are unchanged. Other: None IMPRESSION: 1. Evolving left MCA distribution subacute infarct. 2. Chronic mastoid fluid bilaterally. Electronically Signed   By: Jeronimo Greaves M.D.   On: 05/10/2016 17:56   Ct Head Wo Contrast  Result Date: 05/19/2016 CLINICAL DATA:  83 year old female with fall and laceration to back of the head. EXAM: CT HEAD WITHOUT CONTRAST CT CERVICAL SPINE WITHOUT CONTRAST TECHNIQUE: Multidetector CT imaging of the head and cervical spine was performed following the standard protocol without intravenous contrast. Multiplanar CT image  reconstructions of the cervical spine were also generated. COMPARISON:  Brain MRI dated 04/26/2016 and head CT dated 04/09/2016 FINDINGS: CT HEAD FINDINGS Brain: There is moderate age-related atrophy and chronic microvascular ischemic changes. Focal area of hypodensity in the left corona radiata extending from the left lateral ventricle inferiorly (series 201 images 16-20) appears new since the prior CT and may represent stop a subacute and less likely an acute infarct. Associated mild edema. Correlation with clinical exam recommended. MRI is recommended if there is clinical concern for an acute infarct. There is no acute intracranial hemorrhage. No mass effect or midline shift noted. Vascular: No hyperdense vessel or unexpected calcification. Skull: Normal. Negative for fracture or focal lesion. Sinuses/Orbits: There is partial opacification of multiple ethmoid air cells. No air-fluid levels. There is opacification of the mastoid air cells bilaterally. Other: Small right forehead scalp contusion. CT CERVICAL SPINE FINDINGS Alignment: Normal. Skull base and vertebrae: No acute fracture. No primary bone lesion or focal pathologic process. Soft tissues and spinal canal: No prevertebral fluid or swelling. No visible canal hematoma. Disc levels: Multilevel degenerative changes with mild facet hypertrophy. Upper chest: Negative. Other: None IMPRESSION: No acute intracranial hemorrhage. Focal area of hypodensity extending from the lateral aspect of the left lateral ventricle inferiorly appears new since the prior CT and may represent a subacute or less likely an acute infarct. Correlation with clinical exam recommended. MRI is recommended if there is clinical concern for acute infarct. Moderate age-related atrophy and chronic microvascular ischemic changes. No acute/ traumatic cervical spine pathology. Electronically Signed   By: Elgie Collard M.D.   On: 06/12/2016 03:38   Ct Cervical Spine Wo Contrast  Result  Date: 06/02/2016 CLINICAL DATA:  83 year old female with fall and laceration to back of the head. EXAM: CT HEAD WITHOUT CONTRAST CT CERVICAL SPINE WITHOUT CONTRAST TECHNIQUE: Multidetector CT imaging of the head and cervical spine was performed following the standard protocol without intravenous contrast. Multiplanar CT image  reconstructions of the cervical spine were also generated. COMPARISON:  Brain MRI dated 04/26/2016 and head CT dated 04/09/2016 FINDINGS: CT HEAD FINDINGS Brain: There is moderate age-related atrophy and chronic microvascular ischemic changes. Focal area of hypodensity in the left corona radiata extending from the left lateral ventricle inferiorly (series 201 images 16-20) appears new since the prior CT and may represent stop a subacute and less likely an acute infarct. Associated mild edema. Correlation with clinical exam recommended. MRI is recommended if there is clinical concern for an acute infarct. There is no acute intracranial hemorrhage. No mass effect or midline shift noted. Vascular: No hyperdense vessel or unexpected calcification. Skull: Normal. Negative for fracture or focal lesion. Sinuses/Orbits: There is partial opacification of multiple ethmoid air cells. No air-fluid levels. There is opacification of the mastoid air cells bilaterally. Other: Small right forehead scalp contusion. CT CERVICAL SPINE FINDINGS Alignment: Normal. Skull base and vertebrae: No acute fracture. No primary bone lesion or focal pathologic process. Soft tissues and spinal canal: No prevertebral fluid or swelling. No visible canal hematoma. Disc levels: Multilevel degenerative changes with mild facet hypertrophy. Upper chest: Negative. Other: None IMPRESSION: No acute intracranial hemorrhage. Focal area of hypodensity extending from the lateral aspect of the left lateral ventricle inferiorly appears new since the prior CT and may represent a subacute or less likely an acute infarct. Correlation with  clinical exam recommended. MRI is recommended if there is clinical concern for acute infarct. Moderate age-related atrophy and chronic microvascular ischemic changes. No acute/ traumatic cervical spine pathology. Electronically Signed   By: Elgie Collard M.D.   On: 05/29/2016 03:38   Mr Maxine Glenn Head Wo Contrast  Result Date: 05/10/2016 CLINICAL DATA:  Acute left MCA infarct. EXAM: MRA NECK WITHOUT AND WITH CONTRAST MRA HEAD WITHOUT CONTRAST TECHNIQUE: Multiplanar and multiecho pulse sequences of the neck were obtained without and with intravenous contrast. Angiographic images of the neck were obtained using MRA technique without and with intravenous contast.; Angiographic images of the Circle of Willis were obtained using MRA technique without intravenous contrast. CONTRAST:  14mL MULTIHANCE GADOBENATE DIMEGLUMINE 529 MG/ML IV SOLN COMPARISON:  None. FINDINGS: MRA NECK FINDINGS The study is moderately motion degraded mainly in the lower neck/ upper chest. Presumed standard 3 vessel aortic arch branching, though the origin of the left vertebral artery is not established on this study due to artifact. The brachiocephalic and subclavian arteries are grossly patent. However, motion artifact completely obscures the region of the distal brachiocephalic artery and proximal right common carotid artery. Artifact through this level also obscures a portion of the mid left common carotid artery as well as both proximal V1 segments. The portions of the common carotid arteries which are well-visualized are widely patent. The internal carotid arteries are patent bilaterally without evidence of significant stenosis. The distal right cervical ICA is tortuous. The vertebral arteries are patent and codominant. There is no evidence of flow limiting stenosis in the V2 or V3 segments. There are areas of mild-to-moderate narrowing versus artifact in the V3 segments. The V1 segments are not well evaluated. MRA HEAD FINDINGS The study  is mildly motion degraded. The visualized distal vertebral arteries are patent to the basilar and codominant. Right PICA origin is patent. Left PICA is not identified, nor are AICAs. Basilar artery is widely patent. SCA origins are patent. There is a fetal type origin of the left PCA with absent left P1. The PCAs are patent without evidence of significant proximal stenosis. Internal carotid arteries are  patent from skullbase to carotid termini without evidence of significant stenosis. ACAs and right MCA are patent without evidence of major branch occlusion or flow limiting proximal stenosis. The left M1 segment is patent with at most mild narrowing distally. There is occlusion of the left M2 inferior division at its origin. There is mild reconstitution of flow and some more distal branches. Flow is present in the left M2 superior division, though it has an attenuated appearance. No intracranial aneurysm is identified. IMPRESSION: 1. Occlusion of the left M2 inferior division. Note that the acute infarct on yesterday's MRI is predominantly in the lateral lenticulostriate territory however. 2. Motion degraded neck MRA with limited assessment of the large vessels in the upper chest and lower neck. No evidence of flow limiting cervical carotid or vertebral artery stenosis within this limitation. Electronically Signed   By: Sebastian Ache M.D.   On: 05/10/2016 08:08   Mr Maxine Glenn Neck W Wo Contrast  Result Date: 05/10/2016 CLINICAL DATA:  Acute left MCA infarct. EXAM: MRA NECK WITHOUT AND WITH CONTRAST MRA HEAD WITHOUT CONTRAST TECHNIQUE: Multiplanar and multiecho pulse sequences of the neck were obtained without and with intravenous contrast. Angiographic images of the neck were obtained using MRA technique without and with intravenous contast.; Angiographic images of the Circle of Willis were obtained using MRA technique without intravenous contrast. CONTRAST:  14mL MULTIHANCE GADOBENATE DIMEGLUMINE 529 MG/ML IV SOLN  COMPARISON:  None. FINDINGS: MRA NECK FINDINGS The study is moderately motion degraded mainly in the lower neck/ upper chest. Presumed standard 3 vessel aortic arch branching, though the origin of the left vertebral artery is not established on this study due to artifact. The brachiocephalic and subclavian arteries are grossly patent. However, motion artifact completely obscures the region of the distal brachiocephalic artery and proximal right common carotid artery. Artifact through this level also obscures a portion of the mid left common carotid artery as well as both proximal V1 segments. The portions of the common carotid arteries which are well-visualized are widely patent. The internal carotid arteries are patent bilaterally without evidence of significant stenosis. The distal right cervical ICA is tortuous. The vertebral arteries are patent and codominant. There is no evidence of flow limiting stenosis in the V2 or V3 segments. There are areas of mild-to-moderate narrowing versus artifact in the V3 segments. The V1 segments are not well evaluated. MRA HEAD FINDINGS The study is mildly motion degraded. The visualized distal vertebral arteries are patent to the basilar and codominant. Right PICA origin is patent. Left PICA is not identified, nor are AICAs. Basilar artery is widely patent. SCA origins are patent. There is a fetal type origin of the left PCA with absent left P1. The PCAs are patent without evidence of significant proximal stenosis. Internal carotid arteries are patent from skullbase to carotid termini without evidence of significant stenosis. ACAs and right MCA are patent without evidence of major branch occlusion or flow limiting proximal stenosis. The left M1 segment is patent with at most mild narrowing distally. There is occlusion of the left M2 inferior division at its origin. There is mild reconstitution of flow and some more distal branches. Flow is present in the left M2 superior  division, though it has an attenuated appearance. No intracranial aneurysm is identified. IMPRESSION: 1. Occlusion of the left M2 inferior division. Note that the acute infarct on yesterday's MRI is predominantly in the lateral lenticulostriate territory however. 2. Motion degraded neck MRA with limited assessment of the large vessels in the  upper chest and lower neck. No evidence of flow limiting cervical carotid or vertebral artery stenosis within this limitation. Electronically Signed   By: Sebastian Ache M.D.   On: 05/10/2016 08:08   Mr Brain Wo Contrast  Result Date: 05/05/2016 CLINICAL DATA:  Fall with right-sided weakness. EXAM: MRI HEAD WITHOUT CONTRAST TECHNIQUE: Multiplanar, multiecho pulse sequences of the brain and surrounding structures were obtained without intravenous contrast. COMPARISON:  Head CT 04/18/2016 Brain MR 04/26/2016 FINDINGS: Brain: There are areas of diffusion restriction within the left caudate head, extending into the putamen; the left frontal operculum; and of the subinsular white matter. The abnormality is in close proximity to the posterior limb of the internal capsule. There is also a possible focus of diffusion restriction within the left cerebral peduncle. No acute hemorrhage. There is beginning confluent hyperintense T2-weighted signal within the periventricular white matter, most often seen in the setting of chronic microvascular ischemia. No mass lesion or midline shift. No hydrocephalus or extra-axial fluid collection. The midline structures are normal. No age advanced or lobar predominant atrophy. Vascular: Major intracranial arterial and venous sinus flow voids are preserved. No evidence of chronic microhemorrhage or amyloid angiopathy. Skull and upper cervical spine: The visualized skull base, calvarium, upper cervical spine and extracranial soft tissues are normal. Sinuses/Orbits: Bilateral mastoid effusions. Paranasal sinuses are clear. Normal orbits. IMPRESSION: 1.  Acute infarct within the left subinsular white matter, left frontal operculum and left basal ganglia (predominantly the caudate head and putamen). No hemorrhage, midline shift or mass effect. 2. Punctate focus of possible ischemia within the left cerebral peduncle. 3. Chronic microvascular disease. 4. Bilateral mastoid effusions. Electronically Signed   By: Deatra Robinson M.D.   On: 05/03/2016 05:39   Mr Brain Wo Contrast  Result Date: 04/26/2016 CLINICAL DATA:  Worsening hearing loss over the past 4-5 days. Stroke last month. EXAM: MRI HEAD WITHOUT CONTRAST TECHNIQUE: Multiplanar, multiecho pulse sequences of the brain and surrounding structures were obtained without intravenous contrast. COMPARISON:  04/10/2016 FINDINGS: Brain: Small volume blood products are again seen in the mesial left temporal lobe extending to the region of inferior basal ganglia/ genu of the left internal capsule. Minimal residual diffusion signal abnormality is present in these locations related to the blood products. Abnormal trace diffusion signal elsewhere in the left greater than right cerebral hemispheres and left cerebellum on the prior study has resolved. There is minimal T2/FLAIR hyperintensity in the mesial left temporal lobe/ basal ganglia region and left temporal occipital region corresponding to the previously demonstrated infarcts. There is no evidence of acute infarct, new intracranial hemorrhage, mass, midline shift, or extra-axial fluid collection. Bilateral periventricular white matter T2 hyperintensities are unchanged and nonspecific but compatible with mild chronic small vessel ischemic disease. There is moderate cerebral atrophy. Vascular: Major intracranial vascular flow voids are preserved. Skull and upper cervical spine: Unremarkable bone marrow signal. Sinuses/Orbits: Unremarkable orbits. Minimal posterior right ethmoid air cell mucosal thickening. Large bilateral mastoid effusions, increased from prior. Fluid is  now present in the middle ear cavities bilaterally. Other: None. IMPRESSION: 1. Residual small volume blood products in the mesial left temporal lobe and left internal capsule/inferior basal ganglia region associated with subacute infarcts demonstrated on the prior MRI. 2. Resolved diffusion abnormality associated with the small infarcts elsewhere. 3. No acute infarct or new intracranial abnormality. 4. Large bilateral mastoid and middle ear effusions. Electronically Signed   By: Sebastian Ache M.D.   On: 04/26/2016 19:03   Dg Chest [facility]  Result Date: 05/18/2016 CLINICAL DATA:  Patient with aspiration. EXAM: PORTABLE CHEST 1 VIEW COMPARISON:  Chest radiograph 05/15/2016. FINDINGS: Stable cardiac and mediastinal contours with tortuosity and calcification of the thoracic aorta. Scarring within the right-greater-than-left lower lungs. No large area of pulmonary consolidation. No pleural effusion or pneumothorax. IMPRESSION: No acute cardiopulmonary process. Basilar atelectasis and/or scarring. Electronically Signed   By: Annia Beltrew  Davis M.D.   On: 05/18/2016 16:33   Dg Chest Port 1 View  Result Date: 05/15/2016 CLINICAL DATA:  Short of breath. EXAM: PORTABLE CHEST 1 VIEW COMPARISON:  05/10/2016 FINDINGS: Cardiac silhouette is normal in size. No mediastinal or hilar masses. No convincing adenopathy. Linear and reticular opacities are noted at the lung bases, similar to the prior study, consistent with a combination scarring and subsegmental atelectasis. Lung base and perihilar chronic bronchitic changes also stable. There is no evidence of pneumonia or pulmonary edema. No convincing pleural effusion. No pneumothorax. Skeletal structures are demineralized but grossly intact. IMPRESSION: 1. No acute cardiopulmonary disease. 2. Stable appearance from the prior study. Electronically Signed   By: Amie Portlandavid  Ormond M.D.   On: 05/15/2016 17:08   Dg Chest Port 1 View  Result Date: 05/08/2016 CLINICAL DATA:  Shortness  of Breath following recent fall EXAM: PORTABLE CHEST 1 VIEW COMPARISON:  05/01/2016 FINDINGS: Cardiac shadow is stable. Bibasilar atelectatic changes are noted slightly more prominent than that seen on the prior exam. No pneumothorax or sizable effusion is seen. No bony abnormality is noted. IMPRESSION: Mild bibasilar atelectasis. Electronically Signed   By: Alcide CleverMark  Lukens M.D.   On: 04/17/2016 21:56   Dg Chest Port 1 View  Result Date: 04/23/2016 CLINICAL DATA:  Aspiration pneumonia EXAM: PORTABLE CHEST 1 VIEW COMPARISON:  04/23/2016 at 12:20 FINDINGS: Mild streaky opacity in the right days. Left lung is clear. Pulmonary vasculature is normal. No effusions. Hilar, mediastinal and cardiac contours are unremarkable and unchanged. IMPRESSION: Streaky right base opacity. No focal confluent consolidation. No significant change in the interim. Electronically Signed   By: Ellery Plunkaniel R Mitchell M.D.   On: 04/23/2016 23:41   Dg Chest Port 1 View  Result Date: 04/23/2016 CLINICAL DATA:  COPD, aspiration pneumonia. EXAM: PORTABLE CHEST 1 VIEW COMPARISON:  Portable chest x-ray of Sep 20, 2015 FINDINGS: The lungs are mildly hyperinflated. The interstitial markings are coarse. Increased density at the right lung base is consistent with the clinically suspected aspiration pneumonia. The heart and pulmonary vascularity are normal. There is calcification in the wall of the tortuous thoracic aorta. There is no pleural effusion. The bony thorax exhibits no acute abnormality. IMPRESSION: Right basilar pneumonia consistent with aspiration. COPD. Thoracic aortic atherosclerosis. When the patient can tolerate the procedure, a PA and lateral chest x-ray would be useful. Electronically Signed   By: David  SwazilandJordan M.D.   On: 04/23/2016 12:34   Dg Chest Port 1 View  Result Date: 04/21/2016 CLINICAL DATA:  Initial evaluation for acute respiratory distress, wheezing. History of COPD. EXAM: PORTABLE CHEST 1 VIEW COMPARISON:  Prior  radiograph from 04/21/2016. FINDINGS: Cardiac and mediastinal silhouettes are stable in size and contour, and remain within normal limits. Atherosclerotic disease noted within the aortic arch. Lungs are hypoinflated. Changes related COPD noted. Increased right basilar opacity favored to reflect atelectasis/bronchovascular crowding, although recurrent infiltrate not excluded. Left basilar atelectasis noted. Small left pleural effusion. No pulmonary edema or pneumothorax. No acute osseous abnormality. IMPRESSION: 1. Shallow lung inflation with increased right basilar opacity, favored to reflect atelectasis/ bronchovascular crowding, although recurrent infiltrate could  be considered in the correct clinical setting. 2. Small left pleural effusion with associated atelectasis. 3. COPD. Electronically Signed   By: Rise Mu M.D.   On: 04/21/2016 20:38   Dg Chest Port 1 View  Result Date: 04/21/2016 CLINICAL DATA:  Follow-up wheezing, COPD. EXAM: PORTABLE CHEST 1 VIEW COMPARISON:  04/20/2016 FINDINGS: There is hyperinflation of the lungs compatible with COPD. Heart and mediastinal contours are within normal limits. No focal opacities or effusions. No acute bony abnormality. IMPRESSION: COPD.  No active disease. Electronically Signed   By: Charlett Nose M.D.   On: 04/21/2016 08:27   Dg Chest Port 1v Same Day  Result Date: 05/10/2016 CLINICAL DATA:  Dyspnea, history COPD, stroke EXAM: PORTABLE CHEST 1 VIEW COMPARISON:  Portable exam 0932 hours compared to 05-10-16 FINDINGS: Upper normal heart size. Rotated to the RIGHT. Mediastinal contours and pulmonary vascularity normal. Atherosclerotic calcification aorta. Bronchitic changes with bibasilar atelectasis. Remaining lungs clear. No pleural effusion or pneumothorax. IMPRESSION: Bronchitic changes with bibasilar atelectasis. Aortic atherosclerosis. Electronically Signed   By: Ulyses Southward M.D.   On: 05/10/2016 09:53   Dg Knee Complete 4 Views  Right  Result Date: 2016-05-10 CLINICAL DATA:  84 year old female with fall and right knee swelling. EXAM: RIGHT KNEE - COMPLETE 4+ VIEW COMPARISON:  None. FINDINGS: No acute fracture or dislocation. The bones are osteopenic. There is meniscal chondrocalcinosis with mild osteoarthritic changes of the knees. No joint effusion. The soft tissues appear unremarkable. IMPRESSION: No acute fracture or dislocation. Electronically Signed   By: Elgie Collard M.D.   On: 05/10/16 03:04    Lab Data:  CBC:  Recent Labs Lab 05/16/16 0517 05/19/16 0611 05/20/16 0200  WBC 9.9 12.9* 13.4*  HGB 11.4* 12.1 12.2  HCT 36.5 39.5 39.7  MCV 87.3 90.6 89.8  PLT 309 427* 412*   Basic Metabolic Panel:  Recent Labs Lab 05/15/16 2013 05/16/16 0517 05/17/16 0639 05/18/16 0231 05/19/16 0611 05/20/16 0200  NA 142 146* 144 146* 144 145  K 3.2* 3.3* 4.2 4.3 5.2* 5.2*  CL 105 107 108 109 106 110  CO2 22 26 28 27 29 26   GLUCOSE 108* 107* 157* 160* 151* 180*  BUN 21* 18 23* 32* 35* 43*  CREATININE 0.70 0.67 0.55 0.60 0.62 0.58  CALCIUM 7.5* 7.3* 7.5* 7.9* 7.8* 8.1*  MG 2.0 2.4  --  2.9*  --   --    GFR: Estimated Creatinine Clearance: 47.8 mL/min (by C-G formula based on SCr of 0.58 mg/dL). Liver Function Tests:  Recent Labs Lab 05/16/16 0517  AST 20  ALT 13*  ALKPHOS 54  BILITOT 0.7  PROT 6.9  ALBUMIN 2.5*   No results for input(s): LIPASE, AMYLASE in the last 168 hours. No results for input(s): AMMONIA in the last 168 hours. Coagulation Profile: No results for input(s): INR, PROTIME in the last 168 hours. Cardiac Enzymes: No results for input(s): CKTOTAL, CKMB, CKMBINDEX, TROPONINI in the last 168 hours. BNP (last 3 results) No results for input(s): PROBNP in the last 8760 hours. HbA1C: No results for input(s): HGBA1C in the last 72 hours. CBG: No results for input(s): GLUCAP in the last 168 hours. Lipid Profile: No results for input(s): CHOL, HDL, LDLCALC, TRIG, CHOLHDL,  LDLDIRECT in the last 72 hours. Thyroid Function Tests: No results for input(s): TSH, T4TOTAL, FREET4, T3FREE, THYROIDAB in the last 72 hours. Anemia Panel: No results for input(s): VITAMINB12, FOLATE, FERRITIN, TIBC, IRON, RETICCTPCT in the last 72 hours. Urine analysis:  Component Value Date/Time   COLORURINE YELLOW 05-14-2016 0419   APPEARANCEUR HAZY (A) 05/14/16 0419   LABSPEC 1.020 May 14, 2016 0419   PHURINE 5.0 05-14-16 0419   GLUCOSEU NEGATIVE 05-14-2016 0419   HGBUR NEGATIVE 05-14-16 0419   BILIRUBINUR NEGATIVE May 14, 2016 0419   KETONESUR NEGATIVE 05-14-16 0419   PROTEINUR NEGATIVE 05-14-16 0419   NITRITE NEGATIVE May 14, 2016 0419   LEUKOCYTESUR NEGATIVE 05-14-16 0419   Time spent:  Penny Pia M.D. PhD [facility] 05/20/2016, 2:30 PM   Between 7am to 7pm - call Pager - [phone]  After 7pm go to [email or web address] - password [email or web address]  Call night coverage person covering after 7pm

## 2016-05-20 NOTE — Progress Notes (Signed)
Patient has had a rhythm change to Afib with a rapid ventricular response, EKG completed to confirm and attending has been made aware.  Heart rate toggles between 100's and 130.  Patient is not symptomatic to the change.  Will continue to monitor patient.

## 2016-05-20 NOTE — Progress Notes (Signed)
Daily Progress Note   Patient Name: Catherine Day       Date: 05/20/2016 DOB: 09-07-1927  Age: 83 y.o. MRN#: [id number] Attending Physician: Velvet Bathe, MD Primary Care Physician: No primary care provider on file. Admit Date: 05/02/2016  Reason for Consultation/Follow-up: Establishing goals of care. I revisited today to discuss the plan for feeding with pt's son.  Subjective: Catherine Day was more lethargic and less interactive today. She had no acute complaints and asked that I let her rest. Her son was at the bedside and I was able to speak with him.  Length of Stay: 11  Current Medications: Scheduled Meds:  .  stroke: mapping our early stages of recovery book   Does not apply Once  . aspirin  300 mg Rectal Daily   Or  . aspirin  325 mg Oral Daily  . atorvastatin  40 mg Oral q1800  . azithromycin  500 mg Intravenous Q24H  . carvedilol  3.125 mg Oral BID WC  . cefTRIAXone (ROCEPHIN)  IV  1 g Intravenous Q24H  . clopidogrel  75 mg Oral Daily  . enoxaparin (LOVENOX) injection  40 mg Subcutaneous Q24H  . fluticasone furoate-vilanterol  1 puff Inhalation Daily  . guaiFENesin  600 mg Oral BID  . ipratropium-albuterol  3 mL Nebulization TID  . methylPREDNISolone (SOLU-MEDROL) injection  60 mg Intravenous Q12H  . nystatin   Topical TID    Continuous Infusions: . sodium chloride 50 mL/hr at 05/20/16 1415   PRN Meds: acetaminophen **OR** acetaminophen (TYLENOL) oral liquid 160 mg/5 mL **OR** acetaminophen, food thickener, hydrALAZINE, ipratropium-albuterol, senna-docusate  Physical Exam  Constitutional: Vital signs are normal. She appears lethargic. She appears ill. No distress. Nasal cannula in place.  Frail and elderly woman resting comfortably in bed  HENT:  Head: Normocephalic and  atraumatic.  Eyes: EOM are normal.  Neck: Normal range of motion.  Cardiovascular: Regular rhythm and normal heart sounds.  Bradycardia present.   Pulmonary/Chest: No accessory muscle usage. No tachypnea. No respiratory distress. She has wheezes in the right upper field and the left upper field. She has rhonchi (audible) in the right upper field and the left upper field.  Abdominal: Soft. Normal appearance.  Neurological: She appears lethargic.  Skin: Skin is warm and dry. Bruising noted. There is pallor.  BLE cold. Significant  purple mottling on bilateral feet, slight improvement compared to 1/2. Dependent edema noted in RUE and RLE.  Psychiatric: She has a normal mood and affect. Her speech is delayed. She is slowed.  Smiles when I speak to her.   Nursing note and vitals reviewed.          Vital Signs: BP (!) 145/38   Pulse (!) 51   Temp 98.2 F (36.8 C) (Axillary)   Resp (!) 24   Ht '5\' 2"'  (1.575 m)   Wt 74.8 kg (165 lb)   SpO2 100%   BMI 30.18 kg/m  SpO2: SpO2: 100 % O2 Device: O2 Device: Nasal Cannula O2 Flow Rate: O2 Flow Rate (L/min): 3 L/min  Intake/output summary:   Intake/Output Summary (Last 24 hours) at 05/20/16 1616 Last data filed at 05/20/16 1130  Gross per 24 hour  Intake              200 ml  Output                0 ml  Net              200 ml   LBM: Last BM Date: 05/20/16 Baseline Weight: Weight: 73.9 kg (163 lb) Most recent weight: Weight: 74.8 kg (165 lb)       Palliative Assessment/Data: PPS 30%  Flowsheet Rows   Flowsheet Row Most Recent Value  Intake Tab  Referral Department  Hospitalist  Unit at Time of Referral  Intermediate Care Unit  Palliative Care Primary Diagnosis  Neurology  Palliative Care Type  New Palliative care  Reason for referral  Clarify Goals of Care  Date of Admission  05/02/2016  Date first seen by Palliative Care  05/11/16  Clinical Assessment  Palliative Performance Scale Score  10%  Psychosocial & Spiritual Assessment    Palliative Care Outcomes  Patient/Family meeting held?  Yes  Who was at the meeting?  son  Palliative Care Outcomes  Clarified goals of care, Provided end of life care assistance, Provided psychosocial or spiritual support, Counseled regarding hospice      Patient Active Problem List   Diagnosis Date Noted  . SOB (shortness of breath)   . Palliative care encounter   . Goals of care, counseling/discussion   . At high risk for aspiration   . Cerebrovascular accident (CVA) ([place])   . Acute encephalopathy 04/23/2016  . UTI due to extended-spectrum beta lactamase (ESBL) producing Escherichia coli   . Chronic combined systolic and diastolic CHF, NYHA class 1 ([place])   . Cerebrovascular accident (CVA) due to bilateral thrombosis of vertebral arteries ([place])   . Hypertension 04/14/2016  . Hyperlipidemia 04/14/2016  . 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 Dysphagia 04/14/2016  . Chronic obstructive pulmonary disease ([place])   . Hypokalemia   . Recent Embolic stroke ([place]) - B MCA and L cerebellar     Palliative Care Assessment & Plan   Patient Profile: 83 y.o. female  with past medical history of COPD on home oxygen and recent stroke in November admitted on 05/16/2016 with fall and worsened right sided deficits. MRI showed infarcts in subcortical left hemisphere. MRA with occlusion of the left M2 division. Repeat CT showed evolving left MCA distribution subacute infarct. Patient failed speech swallow evaluation on 12/24. Of note, she did have a recent hospitalization for stroke requiring tPA and complete revascularization of occluded left MCA, left ACA, and left ICA in interventional radiology. That prior hospitalization was complicated by  ESBL positive UTI and aspiration pneumonia.   Assessment: I met with Catherine Day, the patient's son today to re-address the plans for feeding. I explained the dietician's findings, as well as concern expressed by care nurses for the past week  about her variable [but overall poor] oral intake. He had a chance to talk with his sister, and they both want to pursue a feeding tube to support nutrition, hydration, and potentially medication administration (if she should need it in the future).   Recommendations/Plan:  DNR/DNI; continue full scope treatment otherwise  Would recommend feeding tube placement in light of her inadequate oral intake in an alignement with family wishes  Goals of Care and Additional Recommendations:  Limitations on Scope of Treatment: Full Scope Treatment-except DNR/DNI  Code Status: DNR   Code Status Orders        Start     Ordered   05/04/2016 0909  Do not attempt resuscitation (DNR)  Continuous    Question Answer Comment  In the event of cardiac or respiratory ARREST Do not call a "code blue"   In the event of cardiac or respiratory ARREST Do not perform Intubation, CPR, defibrillation or ACLS   In the event of cardiac or respiratory ARREST Use medication by any route, position, wound care, and other measures to relive pain and suffering. May use oxygen, suction and manual treatment of airway obstruction as needed for comfort.      04/19/2016 0908    Code Status History    Date Active Date Inactive Code Status Order ID Comments User Context   05/10/2016  8:40 AM 04/30/2016  9:08 AM Full Code [id number]  Samella Parr, NP Inpatient   04/26/2016  8:18 AM 04/23/2016  8:40 AM DNR [id number]  Samella Parr, NP ED   04/24/2016  4:55 PM 04/27/2016  7:50 PM DNR [id number]  Orson Eva, MD Inpatient   04/15/2016 12:51 AM 04/24/2016  4:54 PM Full Code [id number]  Corey Harold, NP Inpatient   04/14/2016  2:58 PM 04/14/2016  2:58 PM Full Code [id number]  Cathlyn Parsons, PA-C Inpatient   04/14/2016  2:58 PM 04/15/2016 12:51 AM Full Code [id number]  Cathlyn Parsons, PA-C Inpatient   04/09/2016 11:12 PM 04/10/2016  9:15 AM Full Code [id number]  Luanne Bras, MD Inpatient   04/09/2016 11:12 PM 04/09/2016 11:12  PM Full Code [id number]  Greta Doom, MD Inpatient    Advance Directive Documentation   Flowsheet Row Most Recent Value  Type of Advance Directive  Out of facility DNR (pink MOST or yellow form), Healthcare Power of Attorney  Pre-existing out of facility DNR order (yellow form or pink MOST form)  Yellow form placed in chart (order not valid for inpatient use)  "MOST" Form in Place?  No data       Prognosis:   Unable to determine  Discharge Planning:  [place] for rehab with Palliative care service follow-up  Care plan was discussed with pt's son. Primary physician notified of family's desire for feeding tube placement.  Thank you for allowing the Palliative Medicine Team to assist in the care of this patient.   Total time: 35 minutes    Greater than 50%  of this time was spent counseling and coordinating care related to the above assessment and plan.  Charlynn Court [facility] Palliative Medicine Team  [phone] (cell, 7a-5p) [phone] (team phone)

## 2016-05-20 NOTE — Progress Notes (Signed)
Call made to son Luisa Hartatrick to update on mother's condition no answer left a message for him to call me .

## 2016-05-20 NOTE — Progress Notes (Signed)
Text page to Dr Cena BentonVega returned page orders placed- made aware that pt has RR as high as 40

## 2016-05-20 NOTE — Progress Notes (Signed)
Speech Language Pathology Treatment: Dysphagia  Patient Details Name: Hughie ClossRegina Osbourn MRN: [id number] DOB: 08/16/1927 Today's Date: 05/20/2016 Time: [id number] SLP Time Calculation (min) (ACUTE ONLY): 8 min  Assessment / Plan / Recommendation Clinical Impression  Pt has audible wetness upon arrival that cannot be fully cleared despite cued coughing. Per chart review, she has had documented rhonchi throughout this admission. Pt was alert and upright to consume honey thick liquids by spoon, but intake was minimal and she declined any other offerings of POs. No overt signs of aspiration were noted and she did not seem to have a wet vocal quality during vocalizations. RN reports no coughing or signs of distress during meals, but that intake is very prolonged to get in minimal amounts of nutrition. It may be that pt is having difficulty with her secretions. No family present today, but per chart palliative care is recommending conversation about if family would want to supplement nutrition with alternative sources. SLP to continue to monitor.   HPI HPI: Hughie ClossRegina Knieriem is a 83 y.o. female who presents to the Emergency Department complaining of a fall that occurred ago. Per nurse's note, "pt arrived via EMS from [facility] after a fall".       SLP Plan  Continue with current plan of care     Recommendations  Diet recommendations: Dysphagia 1 (puree);Thin liquid Liquids provided via: Teaspoon Medication Administration: Whole meds with puree Supervision: Staff to assist with self feeding;Full supervision/cueing for compensatory strategies Compensations: Small sips/bites;Slow rate Postural Changes and/or Swallow Maneuvers: Seated upright 90 degrees                Oral Care Recommendations: Oral care BID Follow up Recommendations: Skilled Nursing facility Plan: Continue with current plan of care       GO                Maxcine Hamaiewonsky, Sheridyn Canino 05/20/2016, 12:05 PM  Maxcine HamLaura Paiewonsky, M.A.  CCC-SLP [phone]

## 2016-05-21 ENCOUNTER — Inpatient Hospital Stay (HOSPITAL_COMMUNITY): Payer: Medicare Other

## 2016-05-21 ENCOUNTER — Encounter (HOSPITAL_COMMUNITY): Payer: Self-pay | Admitting: General Surgery

## 2016-05-21 LAB — CBC WITH DIFFERENTIAL/PLATELET
Basophils Absolute: 0 10*3/uL (ref 0.0–0.1)
Basophils Relative: 0 %
EOS ABS: 0 10*3/uL (ref 0.0–0.7)
EOS PCT: 0 %
HCT: 39.4 % (ref 36.0–46.0)
Hemoglobin: 11.9 g/dL — ABNORMAL LOW (ref 12.0–15.0)
LYMPHS ABS: 0.5 10*3/uL — AB (ref 0.7–4.0)
Lymphocytes Relative: 2 %
MCH: 27.4 pg (ref 26.0–34.0)
MCHC: 30.2 g/dL (ref 30.0–36.0)
MCV: 90.6 fL (ref 78.0–100.0)
MONO ABS: 0.4 10*3/uL (ref 0.1–1.0)
MONOS PCT: 2 %
Neutro Abs: 21.8 10*3/uL — ABNORMAL HIGH (ref 1.7–7.7)
Neutrophils Relative %: 96 %
PLATELETS: 441 10*3/uL — AB (ref 150–400)
RBC: 4.35 MIL/uL (ref 3.87–5.11)
RDW: 16 % — AB (ref 11.5–15.5)
WBC: 22.7 10*3/uL — AB (ref 4.0–10.5)

## 2016-05-21 LAB — GLUCOSE, CAPILLARY
GLUCOSE-CAPILLARY: 231 mg/dL — AB (ref 65–99)
GLUCOSE-CAPILLARY: 181 mg/dL — AB (ref 65–99)
Glucose-Capillary: 300 mg/dL — ABNORMAL HIGH (ref 65–99)

## 2016-05-21 LAB — BASIC METABOLIC PANEL
Anion gap: 10 (ref 5–15)
BUN: 36 mg/dL — AB (ref 6–20)
CALCIUM: 7.5 mg/dL — AB (ref 8.9–10.3)
CO2: 28 mmol/L (ref 22–32)
CREATININE: 0.72 mg/dL (ref 0.44–1.00)
Chloride: 111 mmol/L (ref 101–111)
GFR calc Af Amer: >60 mL/min (ref >60)
GLUCOSE: 324 mg/dL — AB (ref 65–99)
Potassium: 3.7 mmol/L (ref 3.5–5.1)
SODIUM: 149 mmol/L — AB (ref 135–145)

## 2016-05-21 LAB — PHOSPHORUS: PHOSPHORUS: 3.4 mg/dL (ref 2.5–4.6)

## 2016-05-21 LAB — MAGNESIUM: Magnesium: 2.3 mg/dL (ref 1.7–2.4)

## 2016-05-21 MED ORDER — SODIUM CHLORIDE 0.45 % IV SOLN
INTRAVENOUS | Status: DC
  Administered 2016-05-21 – 2016-05-22 (×2): via INTRAVENOUS

## 2016-05-21 MED ORDER — INSULIN ASPART 100 UNIT/ML ~~LOC~~ SOLN
0.0000 [IU] | Freq: Three times a day (TID) | SUBCUTANEOUS | Status: DC
  Administered 2016-05-21: 5 [IU] via SUBCUTANEOUS

## 2016-05-21 MED ORDER — CEFAZOLIN IN D5W 1 GM/50ML IV SOLN: 1.0000 g | INTRAVENOUS | Status: DC

## 2016-05-21 MED ORDER — MORPHINE SULFATE (PF) 2 MG/ML IV SOLN
2.0000 mg | INTRAVENOUS | Status: DC | PRN
  Administered 2016-05-21: 2 mg via INTRAVENOUS
  Filled 2016-05-21 (×3): qty 1

## 2016-05-21 MED ORDER — MORPHINE SULFATE (PF) 2 MG/ML IV SOLN
2.0000 mg | Freq: Once | INTRAVENOUS | Status: AC
  Administered 2016-05-21: 2 mg via INTRAVENOUS

## 2016-05-21 MED ORDER — INSULIN ASPART 100 UNIT/ML ~~LOC~~ SOLN: 0.0000 [IU] | Freq: Every day | SUBCUTANEOUS | Status: DC

## 2016-05-21 MED ORDER — MORPHINE SULFATE 25 MG/ML IV SOLN
2.0000 mg/h | INTRAVENOUS | Status: DC
  Administered 2016-05-21: 2 mg/h via INTRAVENOUS
  Filled 2016-05-21: qty 10

## 2016-05-21 MED ORDER — MORPHINE SULFATE (PF) 2 MG/ML IV SOLN
2.0000 mg | INTRAVENOUS | Status: DC | PRN
  Administered 2016-05-21: 2 mg via INTRAVENOUS
  Filled 2016-05-21: qty 1

## 2016-05-21 MED ORDER — INSULIN ASPART 100 UNIT/ML ~~LOC~~ SOLN
0.0000 [IU] | SUBCUTANEOUS | Status: DC
  Administered 2016-05-21: 2 [IU] via SUBCUTANEOUS

## 2016-05-21 NOTE — Progress Notes (Signed)
RN went to assess pt and noticed pt would not follow commands or speak and sounded extremely ronchus. Attempted to suction pt per mouth and large amount of blood was obtained. Pt's 02 dropped to 79%. Notified RT Domonique to come to bedside and nonrebreather was placed on pt. Notified NP K.Kirby of incident and ordered to give 2mg  of morphine. RN notified Son Luisa Hartatrick of the incident and advised to come see the Pt ASAP.

## 2016-05-21 NOTE — Progress Notes (Signed)
SLP Cancellation Note  Patient Details Name: Catherine ClossRegina Schwanke MRN: [id number] DOB: 05/01/1928   Cancelled treatment:        Patient currently having chest PT, unable to see. Noted diet in chart placed in error as carb modified with thin liquids. Clarified order with nursing. Based on previous sessions, patient should be on a dysphagia 1 (puree) diet with honey thick liquids via tsp, meds crushed in puree. RN re-entered order. Will f/u 1/5.   Ferdinand LangoLeah Darnice Comrie MA, CCC-SLP [phone]    Jilberto Vanderwall Meryl 05/21/2016, 4:10 PM

## 2016-05-21 NOTE — Progress Notes (Addendum)
[facility]                                                                              Patient Demographics  Catherine Day, is a 83 y.o. female, DOB - 12/28/1927, ZOX:[id number]  Admit date - 04/29/2016   Admitting Physician Haydee Salter, MD  Outpatient Primary MD for the patient is No primary care provider on file.  Outpatient specialists:   LOS - 12  days    Chief Complaint  Patient presents with  . Fall       Brief summary   Patient is a 83 year old female with recent protracted hospitalization for left-sided embolic stroke s/p TPA and revascularization in 03/2016, COPD, O2 dependent, prior hospitalization course complicated by ESBL positive Escherichia coli UTI and aspiration pneumonia. She was discharged to skilled nursing facility on 12//11.  Patient was sent to ER from SNF on 12/23 after a fall. Per ER notes the patient fell about 11:30 PM and at that time it was discovered that the patient was not moving the right side as per previous baseline. MRI showed acute infarcts within the left subinsular white matter, left frontal operculum the left basal ganglia with an area of punctate possible ischemia in the left cerebral peduncle.   Neurology consulted recommended asa 325 and consider loop recorder if family does not choose comfort measures , neurology signed off on 12/28.   Patient was minimal responsive, comfort measures considered initially, from 12/28 patient started to be awake and able to eat and follow commands, family decided for feeding tube and snf placement.  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  venous doppler/abi reporting negative for DVT   Assessment & Plan    Acute CVA in the setting of Recent Embolic stroke (HCC) - B MCA and L cerebellar/Cerebrovascular accident (CVA) due to bilateral thrombosis of vertebral arteries  -Patient recently  admitted for acute stroke requiring tPA as well as complete revascularization of occluded left MCA, left ACA and left ICA terminus in interventional radiology. Baseline deficits prior to this hospitalization included continued right lower extremity weakness with associated gait disturbance and mild to moderate right upper extremity weakness. -Patient now admitted with right-sided hemiparesis, flaccid with findings of new left side multiple area strokes on MRI, also has expressive aphasia - MRA of the brain showed occlusion of the left M2 division, acute infarct on MRIs predominantly in the lateral lenticular striate territory.  -Underwent TEE previous admission with no embolic source found; no indication to repeat echocardiogram this admission -Full stroke evaluation last admission so no indication to repeat carotid duplex, hemoglobin A1c or lipid panel this admission -- she initially failed swallow eval, not interactive, she was on npo , getting rectal asa, Repeat CT head showed evolving left MCA distribution subacute infarct, initially patient did not show any meaningful clinical improvement, palliative care consulted, and comfort care was considered - she showed drastic improvement on 12/28, smiling, interactive and follow command,  Passed swallow eval with dysphagia diet (puree, honey thick liquid), continue ivf for another 24hrs then d/c if  consistent oral intake. -Neurology consulted and signed off on 12/28, reconsulted neurology on 1/2 in regarding antiplt or anticoagulation therapy due to concerning for embolic symdrom ( bilateral purple feet) -still drowsy most of the day, with inconsistent  oral intake, Nutrition consulted for Calorie counting,  -family opted for feeding tube. Consult placed IR. Patient will need to have Plavix held   Acute encephalopathy with hypoxic respiratory failure, aspiration pneumonia, dysphagia, aspirating -- IV Zosyn started on 12/24, Will place back on IV  antibiotics given concern for aspiration. - started to improved from 12/28, but mental status still fluctuating, concerns about silent aspiration  Bilateral purple feet started on 1/2 Unclear etiology , improved with IV fluid rehydration  Audible gurgling cxr unremarkable, abg unremarkable Continue chest physiotherapy     Chronic combined systolic and diastolic CHF, NYHA class 1  --TEE previous admission revealed EF of 45-50% with grade 1 diastolic dysfunction -home lasix held initially due to dry, npo -bp start to increase, has wheezing on 12/29, one dose iv lasix, d/c ivf, change iv abx to oral , stat cxr no acute findings on 12/29 -inconsistence oral intake, will place on IVF's  NSVT: will check tsh, keep k>4, mag>2, started coreg and titrate up as needed.   Hypertension -Allow for permissive hypertension initially, gradually restart home bp meds diovan, low dose coreg started on 12/30 due to NSVT -iv lasix on 12/29 - Will decrease coreg dose    chronic hypoxic respiratory failure on home o2 2liter / Chronic obstructive pulmonary disease with chronic respiratory failure - on DuoNeb's -wheezing persistently , continue nebs ,abx, add steroids Taper steroids    Hyperlipidemia -Holding Lipitor while NPO  FTT: palliative care input appreciated   Code Status: DNR DVT Prophylaxis:  Lovenox  Family Communication: patient    Disposition Plan:   May need feeding tube placement,  Time Spent in minutes   35 minutes  Procedures:  MRI brain MRA brain   Consultants:   Neurology  Palliative care  Antimicrobials:   IV Zosyn 12/24 to 12/29  augmentin from 12/29   Medications  Scheduled Meds: .  stroke: mapping our early stages of recovery book   Does not apply Once  . aspirin  300 mg Rectal Daily   Or  . aspirin  325 mg Oral Daily  . atorvastatin  40 mg Oral q1800  . azithromycin  500 mg Intravenous Q24H  . carvedilol  3.125 mg Oral BID WC  . cefTRIAXone  (ROCEPHIN)  IV  1 g Intravenous Q24H  . enoxaparin (LOVENOX) injection  40 mg Subcutaneous Q24H  . fluticasone furoate-vilanterol  1 puff Inhalation Daily  . guaiFENesin  600 mg Oral BID  . ipratropium-albuterol  3 mL Nebulization TID  . methylPREDNISolone (SOLU-MEDROL) injection  60 mg Intravenous Q12H  . nystatin   Topical TID   Continuous Infusions: . sodium chloride 50 mL/hr at 05/20/16 2247   PRN Meds:.acetaminophen **OR** acetaminophen (TYLENOL) oral liquid 160 mg/5 mL **OR** acetaminophen, food thickener, hydrALAZINE, ipratropium-albuterol, senna-docusate   Antibiotics   Anti-infectives    Start     Dose/Rate Route Frequency Ordered Stop   05/20/16 1500  azithromycin (ZITHROMAX) 500 mg in dextrose 5 % 250 mL IVPB     500 mg 250 mL/hr over 60 Minutes Intravenous Every 24 hours 05/20/16 1413     05/20/16 1500  cefTRIAXone (ROCEPHIN) 1 g in dextrose 5 % 50 mL IVPB     1 g 100 mL/hr over 30 Minutes Intravenous Every  24 hours 05/20/16 1421     05/15/16 2200  amoxicillin-clavulanate (AUGMENTIN) 875-125 MG per tablet 1 tablet  Status:  Discontinued     1 tablet Oral Every 12 hours 05/15/16 1634 05/20/16 1413   05/10/16 0900  piperacillin-tazobactam (ZOSYN) IVPB 3.375 g  Status:  Discontinued     3.375 g 12.5 mL/hr over 240 Minutes Intravenous Every 8 hours 05/10/16 0859 05/15/16 1633        Subjective:   Catherine Day pt is more alert and gazes in my general direction. Tries to speak to examiner but words incomprehesible   Objective:   Vitals:   05/21/16 0800 05/21/16 0827 05/21/16 0900 05/21/16 1000  BP: (!) 153/84  (!) 134/59 (!) 148/58  Pulse: 88  85 86  Resp: 19  (!) 21 (!) 22  Temp: 97.4 F (36.3 C)     TempSrc: Oral     SpO2: 98% 99% 97% 99%  Weight:      Height:        Intake/Output Summary (Last 24 hours) at 05/21/16 1240 Last data filed at 05/21/16 0950  Gross per 24 hour  Intake          1373.75 ml  Output                2 ml  Net          1371.75 ml      Wt Readings from Last 3 Encounters:  12-10-2015 74.8 kg (165 lb)  04/27/16 74.1 kg (163 lb 5.8 oz)  04/14/16 91.2 kg (201 lb)     Exam  General: awake, audible gurgling  Neck: Supple, no JVD  Cardiovascular: S1 S2 clear, RRR,   Respiratory:  Audible gurgling, no wheezes  Gastrointestinal: Soft, nontender, nondistended, + bowel sounds  Ext: trace pitting edema lower extremity  has resolved  Neuro:  right hemiparesis, awake  Skin: no cyanosis, palpable pedal pulse   Data Reviewed:  I have personally reviewed following labs and imaging studies  Micro Results Recent Results (from the past 240 hour(s))  Culture, blood (routine x 2)     Status: None (Preliminary result)   Collection Time: 05/19/16  3:13 PM  Result Value Ref Range Status   Specimen Description BLOOD LEFT ANTECUBITAL  Final   Special Requests IN PEDIATRIC BOTTLE 3CC  Final   Culture NO GROWTH < 24 HOURS  Final   Report Status PENDING  Incomplete  Culture, blood (routine x 2)     Status: None (Preliminary result)   Collection Time: 05/19/16  3:17 PM  Result Value Ref Range Status   Specimen Description BLOOD RIGHT ANTECUBITAL  Final   Special Requests IN PEDIATRIC BOTTLE 3CC  Final   Culture NO GROWTH < 24 HOURS  Final   Report Status PENDING  Incomplete  Culture, respiratory (NON-Expectorated)     Status: None (Preliminary result)   Collection Time: 05/20/16  2:40 PM  Result Value Ref Range Status   Specimen Description TRACHEAL ASPIRATE  Final   Special Requests NONE  Final   Gram Stain   Final    MODERATE WBC PRESENT, PREDOMINANTLY PMN ABUNDANT GRAM POSITIVE RODS MODERATE GRAM NEGATIVE RODS FEW BUDDING YEAST SEEN    Culture ABUNDANT GRAM NEGATIVE RODS  Final   Report Status PENDING  Incomplete    Radiology Reports Ct Abdomen Wo Contrast  Result Date: 05/21/2016 CLINICAL DATA:  Dysphasia. Evaluate anatomy for possible gastrostomy tube placement. EXAM: CT ABDOMEN WITHOUT CONTRAST TECHNIQUE:  Multidetector CT  imaging of the abdomen was performed following the standard protocol without IV contrast. COMPARISON:  None. FINDINGS: Lower chest: Motion artifact at the lung bases. Few patchy densities in the right lower lobe of uncertain chronicity. Findings could be related to aspiration. No large pleural effusions. There is at least mild bronchiectasis in the lower lobes. Few small nodular densities at the lung bases are probably incidental findings, largest measuring 5 mm on sequence 205, image 14 at the right lung base. Hepatobiliary: Mild distention of the gallbladder with layering high density. Findings probably represent gallbladder sludge. Normal appearance of the liver without significant biliary dilatation. Pancreas: Normal appearance of the pancreas without inflammation or duct dilatation. Spleen: Small calcifications in the spleen compatible with old granulomatous disease. No significant splenic enlargement. Adrenals/Urinary Tract: Normal adrenal glands. Poorly defined low-density structure in the medial right kidney measuring up to 1.8 cm could represent a cyst but indeterminate. There may be additional renal cysts. Stomach/Bowel: Evidence for small hiatal hernia. Normal stomach configuration. The stomach configuration is suitable for percutaneous gastrostomy tube placement. The transverse colon is caudal to the stomach. The splenic flexure is the only segment of colon which is a potential problem for a percutaneous gastrostomy tube placement. There is high-density material within the visualized sigmoid colon which could be related to previous contrast administration. No evidence for bowel dilatation or focal inflammation. Vascular/Lymphatic: Aorta is heavily calcified without aneurysm. No significant lymph node enlargement in the abdomen. Other: No evidence for ascites.  No free air. Musculoskeletal: There is mild curvature in the spine. Multilevel degenerative disease in the thoracic and lumbar  spine. IMPRESSION: The stomach and bowel anatomy is suitable for percutaneous gastrostomy tube placement. Visualization of the splenic flexure may be useful during percutaneous gastrostomy tube placement. Patchy densities at the right lung base of unknown age. Findings could be related to inflammatory changes and aspiration. Small nodular densities at lung bases are nonspecific but probably postinflammatory. Largest pulmonary nodule measures 5 mm. No follow-up needed if patient is low-risk (and has no known or suspected primary neoplasm). Non-contrast chest CT can be considered in 12 months if patient is high-risk. This recommendation follows the consensus statement: Guidelines for Management of Incidental Pulmonary Nodules Detected on CT Images: From the Fleischner Society 2017; Radiology 2017; 284:228-243. Electronically Signed   By: Richarda Overlie M.D.   On: 05/21/2016 08:36   Dg Chest 2 View  Result Date: 2016-05-10 CLINICAL DATA:  83 year old female with fall.  History of COPD. EXAM: CHEST  2 VIEW COMPARISON:  Chest radiograph dated 06/25/2015 FINDINGS: There is shallow inspiration. Bibasilar streaky densities most compatible with atelectatic changes/ scarring. Infiltrate is less likely. No pleural effusion or pneumothorax. Stable cardiac silhouette. The aorta is tortuous. There is osteopenia with degenerative changes of the spine. No acute osseous pathology. IMPRESSION: Shallow inspiration with bibasilar atelectatic changes. Infiltrate is less likely. Electronically Signed   By: Elgie Collard M.D.   On: 2016/05/10 03:05   Ct Head Wo Contrast  Result Date: 05/10/2016 CLINICAL DATA:  Acute encephalopathy. Recent left MCA distribution infarct. EXAM: CT HEAD WITHOUT CONTRAST TECHNIQUE: Contiguous axial images were obtained from the base of the skull through the vertex without intravenous contrast. COMPARISON:  MRI of May 10, 2016.  CT 05-10-2016. FINDINGS: Brain: Expected cerebral volume loss for age.  Moderate low density in the periventricular white matter likely related to small vessel disease. Evolution of left MCA distribution infarct, slightly more well-defined today. Examples in the lateral left basal ganglia, subinsular region, and  extending into the periventricular white matter of the left frontal lobe. No significant mass effect or midline shift. No complicating hemorrhage, hydrocephalus, intra-axial, or extra-axial fluid collection. Vascular: Intracranial carotid atherosclerosis. Skull: Normal Sinuses/Orbits: Normal orbits and globes. Minimal ethmoid air cell mucosal thickening. Chronic bilateral mastoid effusions are unchanged. Other: None IMPRESSION: 1. Evolving left MCA distribution subacute infarct. 2. Chronic mastoid fluid bilaterally. Electronically Signed   By: Jeronimo Greaves M.D.   On: 05/10/2016 17:56   Ct Head Wo Contrast  Result Date: 05/04/2016 CLINICAL DATA:  83 year old female with fall and laceration to back of the head. EXAM: CT HEAD WITHOUT CONTRAST CT CERVICAL SPINE WITHOUT CONTRAST TECHNIQUE: Multidetector CT imaging of the head and cervical spine was performed following the standard protocol without intravenous contrast. Multiplanar CT image reconstructions of the cervical spine were also generated. COMPARISON:  Brain MRI dated 04/26/2016 and head CT dated 04/09/2016 FINDINGS: CT HEAD FINDINGS Brain: There is moderate age-related atrophy and chronic microvascular ischemic changes. Focal area of hypodensity in the left corona radiata extending from the left lateral ventricle inferiorly (series 201 images 16-20) appears new since the prior CT and may represent stop a subacute and less likely an acute infarct. Associated mild edema. Correlation with clinical exam recommended. MRI is recommended if there is clinical concern for an acute infarct. There is no acute intracranial hemorrhage. No mass effect or midline shift noted. Vascular: No hyperdense vessel or unexpected calcification.  Skull: Normal. Negative for fracture or focal lesion. Sinuses/Orbits: There is partial opacification of multiple ethmoid air cells. No air-fluid levels. There is opacification of the mastoid air cells bilaterally. Other: Small right forehead scalp contusion. CT CERVICAL SPINE FINDINGS Alignment: Normal. Skull base and vertebrae: No acute fracture. No primary bone lesion or focal pathologic process. Soft tissues and spinal canal: No prevertebral fluid or swelling. No visible canal hematoma. Disc levels: Multilevel degenerative changes with mild facet hypertrophy. Upper chest: Negative. Other: None IMPRESSION: No acute intracranial hemorrhage. Focal area of hypodensity extending from the lateral aspect of the left lateral ventricle inferiorly appears new since the prior CT and may represent a subacute or less likely an acute infarct. Correlation with clinical exam recommended. MRI is recommended if there is clinical concern for acute infarct. Moderate age-related atrophy and chronic microvascular ischemic changes. No acute/ traumatic cervical spine pathology. Electronically Signed   By: Elgie Collard M.D.   On: 05/03/2016 03:38   Ct Cervical Spine Wo Contrast  Result Date: 04/27/2016 CLINICAL DATA:  83 year old female with fall and laceration to back of the head. EXAM: CT HEAD WITHOUT CONTRAST CT CERVICAL SPINE WITHOUT CONTRAST TECHNIQUE: Multidetector CT imaging of the head and cervical spine was performed following the standard protocol without intravenous contrast. Multiplanar CT image reconstructions of the cervical spine were also generated. COMPARISON:  Brain MRI dated 04/26/2016 and head CT dated 04/09/2016 FINDINGS: CT HEAD FINDINGS Brain: There is moderate age-related atrophy and chronic microvascular ischemic changes. Focal area of hypodensity in the left corona radiata extending from the left lateral ventricle inferiorly (series 201 images 16-20) appears new since the prior CT and may represent stop  a subacute and less likely an acute infarct. Associated mild edema. Correlation with clinical exam recommended. MRI is recommended if there is clinical concern for an acute infarct. There is no acute intracranial hemorrhage. No mass effect or midline shift noted. Vascular: No hyperdense vessel or unexpected calcification. Skull: Normal. Negative for fracture or focal lesion. Sinuses/Orbits: There is partial opacification of multiple ethmoid  air cells. No air-fluid levels. There is opacification of the mastoid air cells bilaterally. Other: Small right forehead scalp contusion. CT CERVICAL SPINE FINDINGS Alignment: Normal. Skull base and vertebrae: No acute fracture. No primary bone lesion or focal pathologic process. Soft tissues and spinal canal: No prevertebral fluid or swelling. No visible canal hematoma. Disc levels: Multilevel degenerative changes with mild facet hypertrophy. Upper chest: Negative. Other: None IMPRESSION: No acute intracranial hemorrhage. Focal area of hypodensity extending from the lateral aspect of the left lateral ventricle inferiorly appears new since the prior CT and may represent a subacute or less likely an acute infarct. Correlation with clinical exam recommended. MRI is recommended if there is clinical concern for acute infarct. Moderate age-related atrophy and chronic microvascular ischemic changes. No acute/ traumatic cervical spine pathology. Electronically Signed   By: Elgie Collard M.D.   On: 05/11/2016 03:38   Mr Maxine Glenn Head Wo Contrast  Result Date: 05/10/2016 CLINICAL DATA:  Acute left MCA infarct. EXAM: MRA NECK WITHOUT AND WITH CONTRAST MRA HEAD WITHOUT CONTRAST TECHNIQUE: Multiplanar and multiecho pulse sequences of the neck were obtained without and with intravenous contrast. Angiographic images of the neck were obtained using MRA technique without and with intravenous contast.; Angiographic images of the Circle of Willis were obtained using MRA technique without  intravenous contrast. CONTRAST:  14mL MULTIHANCE GADOBENATE DIMEGLUMINE 529 MG/ML IV SOLN COMPARISON:  None. FINDINGS: MRA NECK FINDINGS The study is moderately motion degraded mainly in the lower neck/ upper chest. Presumed standard 3 vessel aortic arch branching, though the origin of the left vertebral artery is not established on this study due to artifact. The brachiocephalic and subclavian arteries are grossly patent. However, motion artifact completely obscures the region of the distal brachiocephalic artery and proximal right common carotid artery. Artifact through this level also obscures a portion of the mid left common carotid artery as well as both proximal V1 segments. The portions of the common carotid arteries which are well-visualized are widely patent. The internal carotid arteries are patent bilaterally without evidence of significant stenosis. The distal right cervical ICA is tortuous. The vertebral arteries are patent and codominant. There is no evidence of flow limiting stenosis in the V2 or V3 segments. There are areas of mild-to-moderate narrowing versus artifact in the V3 segments. The V1 segments are not well evaluated. MRA HEAD FINDINGS The study is mildly motion degraded. The visualized distal vertebral arteries are patent to the basilar and codominant. Right PICA origin is patent. Left PICA is not identified, nor are AICAs. Basilar artery is widely patent. SCA origins are patent. There is a fetal type origin of the left PCA with absent left P1. The PCAs are patent without evidence of significant proximal stenosis. Internal carotid arteries are patent from skullbase to carotid termini without evidence of significant stenosis. ACAs and right MCA are patent without evidence of major branch occlusion or flow limiting proximal stenosis. The left M1 segment is patent with at most mild narrowing distally. There is occlusion of the left M2 inferior division at its origin. There is mild  reconstitution of flow and some more distal branches. Flow is present in the left M2 superior division, though it has an attenuated appearance. No intracranial aneurysm is identified. IMPRESSION: 1. Occlusion of the left M2 inferior division. Note that the acute infarct on yesterday's MRI is predominantly in the lateral lenticulostriate territory however. 2. Motion degraded neck MRA with limited assessment of the large vessels in the upper chest and lower neck. No  evidence of flow limiting cervical carotid or vertebral artery stenosis within this limitation. Electronically Signed   By: Sebastian Ache M.D.   On: 05/10/2016 08:08   Mr Maxine Glenn Neck W Wo Contrast  Result Date: 05/10/2016 CLINICAL DATA:  Acute left MCA infarct. EXAM: MRA NECK WITHOUT AND WITH CONTRAST MRA HEAD WITHOUT CONTRAST TECHNIQUE: Multiplanar and multiecho pulse sequences of the neck were obtained without and with intravenous contrast. Angiographic images of the neck were obtained using MRA technique without and with intravenous contast.; Angiographic images of the Circle of Willis were obtained using MRA technique without intravenous contrast. CONTRAST:  14mL MULTIHANCE GADOBENATE DIMEGLUMINE 529 MG/ML IV SOLN COMPARISON:  None. FINDINGS: MRA NECK FINDINGS The study is moderately motion degraded mainly in the lower neck/ upper chest. Presumed standard 3 vessel aortic arch branching, though the origin of the left vertebral artery is not established on this study due to artifact. The brachiocephalic and subclavian arteries are grossly patent. However, motion artifact completely obscures the region of the distal brachiocephalic artery and proximal right common carotid artery. Artifact through this level also obscures a portion of the mid left common carotid artery as well as both proximal V1 segments. The portions of the common carotid arteries which are well-visualized are widely patent. The internal carotid arteries are patent bilaterally without  evidence of significant stenosis. The distal right cervical ICA is tortuous. The vertebral arteries are patent and codominant. There is no evidence of flow limiting stenosis in the V2 or V3 segments. There are areas of mild-to-moderate narrowing versus artifact in the V3 segments. The V1 segments are not well evaluated. MRA HEAD FINDINGS The study is mildly motion degraded. The visualized distal vertebral arteries are patent to the basilar and codominant. Right PICA origin is patent. Left PICA is not identified, nor are AICAs. Basilar artery is widely patent. SCA origins are patent. There is a fetal type origin of the left PCA with absent left P1. The PCAs are patent without evidence of significant proximal stenosis. Internal carotid arteries are patent from skullbase to carotid termini without evidence of significant stenosis. ACAs and right MCA are patent without evidence of major branch occlusion or flow limiting proximal stenosis. The left M1 segment is patent with at most mild narrowing distally. There is occlusion of the left M2 inferior division at its origin. There is mild reconstitution of flow and some more distal branches. Flow is present in the left M2 superior division, though it has an attenuated appearance. No intracranial aneurysm is identified. IMPRESSION: 1. Occlusion of the left M2 inferior division. Note that the acute infarct on yesterday's MRI is predominantly in the lateral lenticulostriate territory however. 2. Motion degraded neck MRA with limited assessment of the large vessels in the upper chest and lower neck. No evidence of flow limiting cervical carotid or vertebral artery stenosis within this limitation. Electronically Signed   By: Sebastian Ache M.D.   On: 05/10/2016 08:08   Mr Brain Wo Contrast  Result Date: May 12, 2016 CLINICAL DATA:  Fall with right-sided weakness. EXAM: MRI HEAD WITHOUT CONTRAST TECHNIQUE: Multiplanar, multiecho pulse sequences of the brain and surrounding  structures were obtained without intravenous contrast. COMPARISON:  Head CT 05/12/2016 Brain MR 04/26/2016 FINDINGS: Brain: There are areas of diffusion restriction within the left caudate head, extending into the putamen; the left frontal operculum; and of the subinsular white matter. The abnormality is in close proximity to the posterior limb of the internal capsule. There is also a possible focus of diffusion  restriction within the left cerebral peduncle. No acute hemorrhage. There is beginning confluent hyperintense T2-weighted signal within the periventricular white matter, most often seen in the setting of chronic microvascular ischemia. No mass lesion or midline shift. No hydrocephalus or extra-axial fluid collection. The midline structures are normal. No age advanced or lobar predominant atrophy. Vascular: Major intracranial arterial and venous sinus flow voids are preserved. No evidence of chronic microhemorrhage or amyloid angiopathy. Skull and upper cervical spine: The visualized skull base, calvarium, upper cervical spine and extracranial soft tissues are normal. Sinuses/Orbits: Bilateral mastoid effusions. Paranasal sinuses are clear. Normal orbits. IMPRESSION: 1. Acute infarct within the left subinsular white matter, left frontal operculum and left basal ganglia (predominantly the caudate head and putamen). No hemorrhage, midline shift or mass effect. 2. Punctate focus of possible ischemia within the left cerebral peduncle. 3. Chronic microvascular disease. 4. Bilateral mastoid effusions. Electronically Signed   By: Deatra Robinson M.D.   On: 05/12/2016 05:39   Mr Brain Wo Contrast  Result Date: 04/26/2016 CLINICAL DATA:  Worsening hearing loss over the past 4-5 days. Stroke last month. EXAM: MRI HEAD WITHOUT CONTRAST TECHNIQUE: Multiplanar, multiecho pulse sequences of the brain and surrounding structures were obtained without intravenous contrast. COMPARISON:  04/10/2016 FINDINGS: Brain: Small  volume blood products are again seen in the mesial left temporal lobe extending to the region of inferior basal ganglia/ genu of the left internal capsule. Minimal residual diffusion signal abnormality is present in these locations related to the blood products. Abnormal trace diffusion signal elsewhere in the left greater than right cerebral hemispheres and left cerebellum on the prior study has resolved. There is minimal T2/FLAIR hyperintensity in the mesial left temporal lobe/ basal ganglia region and left temporal occipital region corresponding to the previously demonstrated infarcts. There is no evidence of acute infarct, new intracranial hemorrhage, mass, midline shift, or extra-axial fluid collection. Bilateral periventricular white matter T2 hyperintensities are unchanged and nonspecific but compatible with mild chronic small vessel ischemic disease. There is moderate cerebral atrophy. Vascular: Major intracranial vascular flow voids are preserved. Skull and upper cervical spine: Unremarkable bone marrow signal. Sinuses/Orbits: Unremarkable orbits. Minimal posterior right ethmoid air cell mucosal thickening. Large bilateral mastoid effusions, increased from prior. Fluid is now present in the middle ear cavities bilaterally. Other: None. IMPRESSION: 1. Residual small volume blood products in the mesial left temporal lobe and left internal capsule/inferior basal ganglia region associated with subacute infarcts demonstrated on the prior MRI. 2. Resolved diffusion abnormality associated with the small infarcts elsewhere. 3. No acute infarct or new intracranial abnormality. 4. Large bilateral mastoid and middle ear effusions. Electronically Signed   By: Sebastian Ache M.D.   On: 04/26/2016 19:03   Dg Chest Port 1 View  Result Date: 05/20/2016 CLINICAL DATA:  Cough EXAM: PORTABLE CHEST 1 VIEW COMPARISON:  May 18, 2016 and April 23, 2016 FINDINGS: There is scarring in the bases, more on the left than on the  right. There is no edema or consolidation. The heart size and pulmonary vascularity are normal. No adenopathy. There is atherosclerotic calcification in the aorta. Bones appear somewhat osteoporotic. IMPRESSION: Bibasilar scarring. No edema or consolidation. Aortic atherosclerosis. Bones osteoporotic. Electronically Signed   By: Bretta Bang III M.D.   On: 05/20/2016 15:03   Dg Chest Port 1 View  Result Date: 05/18/2016 CLINICAL DATA:  Patient with aspiration. EXAM: PORTABLE CHEST 1 VIEW COMPARISON:  Chest radiograph 05/15/2016. FINDINGS: Stable cardiac and mediastinal contours with tortuosity and calcification of the  thoracic aorta. Scarring within the right-greater-than-left lower lungs. No large area of pulmonary consolidation. No pleural effusion or pneumothorax. IMPRESSION: No acute cardiopulmonary process. Basilar atelectasis and/or scarring. Electronically Signed   By: Annia Belt M.D.   On: 05/18/2016 16:33   Dg Chest Port 1 View  Result Date: 05/15/2016 CLINICAL DATA:  Short of breath. EXAM: PORTABLE CHEST 1 VIEW COMPARISON:  05/10/2016 FINDINGS: Cardiac silhouette is normal in size. No mediastinal or hilar masses. No convincing adenopathy. Linear and reticular opacities are noted at the lung bases, similar to the prior study, consistent with a combination scarring and subsegmental atelectasis. Lung base and perihilar chronic bronchitic changes also stable. There is no evidence of pneumonia or pulmonary edema. No convincing pleural effusion. No pneumothorax. Skeletal structures are demineralized but grossly intact. IMPRESSION: 1. No acute cardiopulmonary disease. 2. Stable appearance from the prior study. Electronically Signed   By: Amie Portland M.D.   On: 05/15/2016 17:08   Dg Chest Port 1 View  Result Date: 04/17/2016 CLINICAL DATA:  Shortness of Breath following recent fall EXAM: PORTABLE CHEST 1 VIEW COMPARISON:  04/25/2016 FINDINGS: Cardiac shadow is stable. Bibasilar atelectatic  changes are noted slightly more prominent than that seen on the prior exam. No pneumothorax or sizable effusion is seen. No bony abnormality is noted. IMPRESSION: Mild bibasilar atelectasis. Electronically Signed   By: Alcide Clever M.D.   On: 05/11/2016 21:56   Dg Chest Port 1 View  Result Date: 04/23/2016 CLINICAL DATA:  Aspiration pneumonia EXAM: PORTABLE CHEST 1 VIEW COMPARISON:  04/23/2016 at 12:20 FINDINGS: Mild streaky opacity in the right days. Left lung is clear. Pulmonary vasculature is normal. No effusions. Hilar, mediastinal and cardiac contours are unremarkable and unchanged. IMPRESSION: Streaky right base opacity. No focal confluent consolidation. No significant change in the interim. Electronically Signed   By: Ellery Plunk M.D.   On: 04/23/2016 23:41   Dg Chest Port 1 View  Result Date: 04/23/2016 CLINICAL DATA:  COPD, aspiration pneumonia. EXAM: PORTABLE CHEST 1 VIEW COMPARISON:  Portable chest x-ray of Sep 20, 2015 FINDINGS: The lungs are mildly hyperinflated. The interstitial markings are coarse. Increased density at the right lung base is consistent with the clinically suspected aspiration pneumonia. The heart and pulmonary vascularity are normal. There is calcification in the wall of the tortuous thoracic aorta. There is no pleural effusion. The bony thorax exhibits no acute abnormality. IMPRESSION: Right basilar pneumonia consistent with aspiration. COPD. Thoracic aortic atherosclerosis. When the patient can tolerate the procedure, a PA and lateral chest x-ray would be useful. Electronically Signed   By: David  [place] M.D.   On: 04/23/2016 12:34   Dg Chest Port 1 View  Result Date: 04/21/2016 CLINICAL DATA:  Initial evaluation for acute respiratory distress, wheezing. History of COPD. EXAM: PORTABLE CHEST 1 VIEW COMPARISON:  Prior radiograph from 04/21/2016. FINDINGS: Cardiac and mediastinal silhouettes are stable in size and contour, and remain within normal limits.  Atherosclerotic disease noted within the aortic arch. Lungs are hypoinflated. Changes related COPD noted. Increased right basilar opacity favored to reflect atelectasis/bronchovascular crowding, although recurrent infiltrate not excluded. Left basilar atelectasis noted. Small left pleural effusion. No pulmonary edema or pneumothorax. No acute osseous abnormality. IMPRESSION: 1. Shallow lung inflation with increased right basilar opacity, favored to reflect atelectasis/ bronchovascular crowding, although recurrent infiltrate could be considered in the correct clinical setting. 2. Small left pleural effusion with associated atelectasis. 3. COPD. Electronically Signed   By: Rise Mu M.D.   On: 04/21/2016 20:38  Dg Chest Port 1v Same Day  Result Date: 05/10/2016 CLINICAL DATA:  Dyspnea, history COPD, stroke EXAM: PORTABLE CHEST 1 VIEW COMPARISON:  Portable exam 0932 hours compared to 04/25/2016 FINDINGS: Upper normal heart size. Rotated to the RIGHT. Mediastinal contours and pulmonary vascularity normal. Atherosclerotic calcification aorta. Bronchitic changes with bibasilar atelectasis. Remaining lungs clear. No pleural effusion or pneumothorax. IMPRESSION: Bronchitic changes with bibasilar atelectasis. Aortic atherosclerosis. Electronically Signed   By: Ulyses Southward M.D.   On: 05/10/2016 09:53   Dg Knee Complete 4 Views Right  Result Date: 05/02/2016 CLINICAL DATA:  83 year old female with fall and right knee swelling. EXAM: RIGHT KNEE - COMPLETE 4+ VIEW COMPARISON:  None. FINDINGS: No acute fracture or dislocation. The bones are osteopenic. There is meniscal chondrocalcinosis with mild osteoarthritic changes of the knees. No joint effusion. The soft tissues appear unremarkable. IMPRESSION: No acute fracture or dislocation. Electronically Signed   By: Elgie Collard M.D.   On: 04/27/2016 03:04    Lab Data:  CBC:  Recent Labs Lab 05/16/16 0517 05/19/16 0611 05/20/16 0200 05/21/16 1156   WBC 9.9 12.9* 13.4* 22.7*  NEUTROABS  --   --   --  21.8*  HGB 11.4* 12.1 12.2 11.9*  HCT 36.5 39.5 39.7 39.4  MCV 87.3 90.6 89.8 90.6  PLT 309 427* 412* 441*   Basic Metabolic Panel:  Recent Labs Lab 05/15/16 2013 05/16/16 0517 05/17/16 0639 05/18/16 0231 05/19/16 0611 05/20/16 0200  NA 142 146* 144 146* 144 145  K 3.2* 3.3* 4.2 4.3 5.2* 5.2*  CL 105 107 108 109 106 110  CO2 22 26 28 27 29 26   GLUCOSE 108* 107* 157* 160* 151* 180*  BUN 21* 18 23* 32* 35* 43*  CREATININE 0.70 0.67 0.55 0.60 0.62 0.58  CALCIUM 7.5* 7.3* 7.5* 7.9* 7.8* 8.1*  MG 2.0 2.4  --  2.9*  --   --    GFR: Estimated Creatinine Clearance: 47.8 mL/min (by C-G formula based on SCr of 0.58 mg/dL). Liver Function Tests:  Recent Labs Lab 05/16/16 0517  AST 20  ALT 13*  ALKPHOS 54  BILITOT 0.7  PROT 6.9  ALBUMIN 2.5*   No results for input(s): LIPASE, AMYLASE in the last 168 hours. No results for input(s): AMMONIA in the last 168 hours. Coagulation Profile: No results for input(s): INR, PROTIME in the last 168 hours. Cardiac Enzymes: No results for input(s): CKTOTAL, CKMB, CKMBINDEX, TROPONINI in the last 168 hours. BNP (last 3 results) No results for input(s): PROBNP in the last 8760 hours. HbA1C: No results for input(s): HGBA1C in the last 72 hours. CBG: No results for input(s): GLUCAP in the last 168 hours. Lipid Profile: No results for input(s): CHOL, HDL, LDLCALC, TRIG, CHOLHDL, LDLDIRECT in the last 72 hours. Thyroid Function Tests: No results for input(s): TSH, T4TOTAL, FREET4, T3FREE, THYROIDAB in the last 72 hours. Anemia Panel: No results for input(s): VITAMINB12, FOLATE, FERRITIN, TIBC, IRON, RETICCTPCT in the last 72 hours. Urine analysis:    Component Value Date/Time   COLORURINE YELLOW 04/28/2016 0419   APPEARANCEUR HAZY (A) 05/03/2016 0419   LABSPEC 1.020 04/30/2016 0419   PHURINE 5.0 04/24/2016 0419   GLUCOSEU NEGATIVE 04/22/2016 0419   HGBUR NEGATIVE 05/07/2016 0419     BILIRUBINUR NEGATIVE 04/29/2016 0419   KETONESUR NEGATIVE 05/02/2016 0419   PROTEINUR NEGATIVE 04/29/2016 0419   NITRITE NEGATIVE 05/08/2016 0419   LEUKOCYTESUR NEGATIVE 04/24/2016 0419   Time spent:  Penny Pia M.D. PhD [facility] 05/21/2016, 12:40  PM   Between 7am to 7pm - call Pager - [phone]  After 7pm go to [email or web address] - password [email or web address]  Call night coverage person covering after 7pm  Addendum: Added SSI/placed on diabetic diet Hypokalemia resolved Leukocytosis: will continue current antibiotic regimen. Hypernatremia - place on 1/2 normal saline

## 2016-05-21 NOTE — Progress Notes (Addendum)
Shift event: RN called NP because she suctioned pt's mouth and frank bleeding started. Pt is known aspiration. O2 sats dropped to 82% and RT placed pt on NRB. + severe respiratory distress and increased work of breathing. MSO4 2mg  IV and CXR ordered and NP to bedside.  S: pt can not participate in ROS due to mental status.  O: Toxic appearing elderly female in acute respiratory distress. + blood draining from mouth with audible congestive breath sounds.  Lungs full, very congested. Ectopy on tele.  Neuro: pt is unresponsive to verbal stimulation. A/P: 1. Acute respiratory distress with hemoptysis. Pt is toxic appearing and in severe respiratory distress. Appears very uncomfortable. Additional MSO4 given. Son already updated by RN and NP called as well. Informed of his mother's critical condition. He gave NP permission to use MSO4 for comfort. NP believes pt will not survive the night. Son calm and accepting and will come to the hospital. She is a DNR.  2. Multiple acute and chronic issues including acute on chronic CVAs.  KJKG, NP [facility] Update: Son and daughter-n-law arrived. Discussed situation. Son is in agreement to comfort care at this time. MSO4 drip ordered. Stop meds, labs, etc.  KJKG, NP [facility]

## 2016-05-21 NOTE — Progress Notes (Signed)
OT Cancellation Note  Patient Details Name: Hughie ClossRegina Creek MRN: [id number] DOB: 05/28/1927   Cancelled Treatment:    Reason Eval/Treat Not Completed: Patient at procedure or test/ unavailable (having chest PT) Will attempt to see tomorrow if able.  [facility] Clinic[facility]  Marzelle Rutten, OT/L  [phone] 05/21/2016 05/21/2016, 4:22 PM

## 2016-05-21 NOTE — Consult Note (Signed)
Chief Complaint: dysphagia  Referring Physician:Dr. Velvet Bathe  Supervising Physician: Markus Daft  Patient Status: [facility] - In-pt  HPI: Catherine Day is an 83 y.o. female with a complex PMH who is known to the neuro IR service for complete revascularization of occluded left internal carotid artery terminus, the left middle cerebral artery and left anterior cerebral artery by Dr. Estanislado Pandy on 04-09-16 for a CVA.  She was admitted in the hospital for a while after that procedure and eventually DC to SNF.  She was brought back to the ED on 05/08/2016 after a fall and neglect of her right side.  She was noted to have more acute infarcts and eventually embolic syndrome for which she was started on Plavix on 05-19-16.  She is having issues with dysphagia and poor oral intake.  Her family would like to pursue g-tube placement and SNF placement.  We have been asked to see her for g-tube placement.  Past Medical History:  Past Medical History:  Diagnosis Date  . COPD (chronic obstructive pulmonary disease) ([place])   . Stroke [facility])     Past Surgical History:  Past Surgical History:  Procedure Laterality Date  . IR GENERIC HISTORICAL  04/09/2016   IR ANGIO INTRA EXTRACRAN SEL COM CAROTID INNOMINATE UNI R MOD SED 04/09/2016 Luanne Bras, MD [facility]-INTERV RAD  . IR GENERIC HISTORICAL  04/09/2016   IR PERCUTANEOUS ART THROMBECTOMY/INFUSION INTRACRANIAL INC DIAG ANGIO 04/09/2016 Luanne Bras, MD [facility]INTER[facility]  . PELVIC FRACTURE SURGERY    . RADIOLOGY WITH ANESTHESIA N/A 04/09/2016   Procedure: RADIOLOGY WITH ANESTHESIA;  Surgeon: Luanne Bras, MD;  Location: [place];  Service: Radiology;  Laterality: N/A;  . TEE WITHOUT CARDIOVERSION N/A 04/13/2016   Procedure: TRANSESOPHAGEAL ECHOCARDIOGRAM (TEE);  Surgeon: Sanda Klein, MD;  Location: [facility] ENDOSCOPY;  Service: Cardiovascular;  Laterality: N/A;    Family History: History reviewed. No pertinent family history.  Social History:  reports that  she has never smoked. She has never used smokeless tobacco. She reports that she does not drink alcohol or use drugs.  Allergies: No Known Allergies  Medications: Medications reviewed in epic, last dose of plavix on 05-20-16 at 0900.  Unable to obtain ROS as patient is not oriented   Mallampati Score: MD Evaluation Airway: WNL Heart: WNL Abdomen: WNL Chest/ Lungs: WNL ASA  Classification: 3 Mallampati/Airway Score: Two  Physical Exam: BP (!) 153/84   Pulse 88   Temp 97.4 F (36.3 C) (Oral)   Resp 19   Ht _0  (1.575 m)   Wt 165 lb (74.8 kg)   SpO2 99%   BMI 30.18 kg/m  Body mass index is 30.18 kg/m. General: confused, elderly, white female who is laying in bed in NAD HEENT: head is normocephalic, atraumatic.  Sclera are noninjected.  PERRL.  Ears and nose without any masses or lesions.  Mouth is pink and moist, [place] in place in nose Heart: regular, rate, and rhythm.  Normal s1,s2. No obvious murmurs, gallops, or rubs noted.  Palpable radial and pedal pulses bilaterally Lungs: coarse respiratory sounds, some transmitted via upper airway.  Respiratory effort nonlabored Abd: soft, NT, ND, +BS, no masses, hernias, or organomegaly Psych: Alert but not oriented.  She does know her name, but does not know her DOB, place, or time   Labs: Results for orders placed or performed during the hospital encounter of 05/14/2016 (from the past 48 hour(s))  Culture, blood (routine x 2)     Status: None (Preliminary result)  Collection Time: 05/19/16  3:13 PM  Result Value Ref Range   Specimen Description BLOOD LEFT ANTECUBITAL    Special Requests IN PEDIATRIC BOTTLE 3CC    Culture NO GROWTH < 24 HOURS    Report Status PENDING   Culture, blood (routine x 2)     Status: None (Preliminary result)   Collection Time: 05/19/16  3:17 PM  Result Value Ref Range   Specimen Description BLOOD RIGHT ANTECUBITAL    Special Requests IN PEDIATRIC BOTTLE 3CC    Culture NO GROWTH < 24 HOURS    Report  Status PENDING   CBC     Status: Abnormal   Collection Time: 05/20/16  2:00 AM  Result Value Ref Range   WBC 13.4 (H) 4.0 - 10.5 K/uL   RBC 4.42 3.87 - 5.11 MIL/uL   Hemoglobin 12.2 12.0 - 15.0 g/dL   HCT 39.7 36.0 - 46.0 %   MCV 89.8 78.0 - 100.0 fL   MCH 27.6 26.0 - 34.0 pg   MCHC 30.7 30.0 - 36.0 g/dL   RDW 16.2 (H) 11.5 - 15.5 %   Platelets 412 (H) 150 - 400 K/uL  Basic metabolic panel     Status: Abnormal   Collection Time: 05/20/16  2:00 AM  Result Value Ref Range   Sodium 145 135 - 145 mmol/L   Potassium 5.2 (H) 3.5 - 5.1 mmol/L   Chloride 110 101 - 111 mmol/L   CO2 26 22 - 32 mmol/L   Glucose, Bld 180 (H) 65 - 99 mg/dL   BUN 43 (H) 6 - 20 mg/dL   Creatinine, Ser 0.58 0.44 - 1.00 mg/dL   Calcium 8.1 (L) 8.9 - 10.3 mg/dL   GFR calc non Af Amer >60 >60 mL/min   GFR calc Af Amer >60 >60 mL/min    Comment: (NOTE) The eGFR has been calculated using the CKD EPI equation. This calculation has not been validated in all clinical situations. eGFR's persistently <60 mL/min signify possible Chronic Kidney Disease.    Anion gap 9 5 - 15  Culture, respiratory (NON-Expectorated)     Status: None (Preliminary result)   Collection Time: 05/20/16  2:40 PM  Result Value Ref Range   Specimen Description TRACHEAL ASPIRATE    Special Requests NONE    Gram Stain      MODERATE WBC PRESENT, PREDOMINANTLY PMN ABUNDANT GRAM POSITIVE RODS MODERATE GRAM NEGATIVE RODS FEW BUDDING YEAST SEEN    Culture ABUNDANT GRAM NEGATIVE RODS    Report Status PENDING     Imaging: Ct Abdomen Wo Contrast  Result Date: 05/21/2016 CLINICAL DATA:  Dysphasia. Evaluate anatomy for possible gastrostomy tube placement. EXAM: CT ABDOMEN WITHOUT CONTRAST TECHNIQUE: Multidetector CT imaging of the abdomen was performed following the standard protocol without IV contrast. COMPARISON:  None. FINDINGS: Lower chest: Motion artifact at the lung bases. Few patchy densities in the right lower lobe of uncertain chronicity.  Findings could be related to aspiration. No large pleural effusions. There is at least mild bronchiectasis in the lower lobes. Few small nodular densities at the lung bases are probably incidental findings, largest measuring 5 mm on sequence 205, image 14 at the right lung base. Hepatobiliary: Mild distention of the gallbladder with layering high density. Findings probably represent gallbladder sludge. Normal appearance of the liver without significant biliary dilatation. Pancreas: Normal appearance of the pancreas without inflammation or duct dilatation. Spleen: Small calcifications in the spleen compatible with old granulomatous disease. No significant splenic enlargement. Adrenals/Urinary Tract: Normal  adrenal glands. Poorly defined low-density structure in the medial right kidney measuring up to 1.8 cm could represent a cyst but indeterminate. There may be additional renal cysts. Stomach/Bowel: Evidence for small hiatal hernia. Normal stomach configuration. The stomach configuration is suitable for percutaneous gastrostomy tube placement. The transverse colon is caudal to the stomach. The splenic flexure is the only segment of colon which is a potential problem for a percutaneous gastrostomy tube placement. There is high-density material within the visualized sigmoid colon which could be related to previous contrast administration. No evidence for bowel dilatation or focal inflammation. Vascular/Lymphatic: Aorta is heavily calcified without aneurysm. No significant lymph node enlargement in the abdomen. Other: No evidence for ascites.  No free air. Musculoskeletal: There is mild curvature in the spine. Multilevel degenerative disease in the thoracic and lumbar spine. IMPRESSION: The stomach and bowel anatomy is suitable for percutaneous gastrostomy tube placement. Visualization of the splenic flexure may be useful during percutaneous gastrostomy tube placement. Patchy densities at the right lung base of unknown  age. Findings could be related to inflammatory changes and aspiration. Small nodular densities at lung bases are nonspecific but probably postinflammatory. Largest pulmonary nodule measures 5 mm. No follow-up needed if patient is low-risk (and has no known or suspected primary neoplasm). Non-contrast chest CT can be considered in 12 months if patient is high-risk. This recommendation follows the consensus statement: Guidelines for Management of Incidental Pulmonary Nodules Detected on CT Images: From the Fleischner Society 2017; Radiology 2017; 284:228-243. Electronically Signed   By: Markus Daft M.D.   On: 05/21/2016 08:36   Dg Chest Port 1 View  Result Date: 05/20/2016 CLINICAL DATA:  Cough EXAM: PORTABLE CHEST 1 VIEW COMPARISON:  May 18, 2016 and April 23, 2016 FINDINGS: There is scarring in the bases, more on the left than on the right. There is no edema or consolidation. The heart size and pulmonary vascularity are normal. No adenopathy. There is atherosclerotic calcification in the aorta. Bones appear somewhat osteoporotic. IMPRESSION: Bibasilar scarring. No edema or consolidation. Aortic atherosclerosis. Bones osteoporotic. Electronically Signed   By: Lowella Grip III M.D.   On: 05/20/2016 15:03    Assessment/Plan 1. Dysphagia -her plavix has been held.  Her last dose was 1-3.  We can plan to proceed with g-tube placement on Monday 1-8.   -she is unable to consent herself.  I will try to contact her son to get permission to proceed with placement. -labs and vitals have been reviewed.  Thank you for this interesting consult.  I greatly enjoyed meeting Carlota Philley and look forward to participating in their care.  A copy of this report was sent to the requesting provider on this date.  Electronically Signed: Henreitta Cea 05/21/2016, 10:05 AM   I spent a total of    40 Minutes in face to face in clinical consultation, greater than 50% of which was counseling/coordinating care for  dysphagia, request for g-tube placement

## 2016-05-21 NOTE — Progress Notes (Signed)
CSW will continue to follow for return to [facility] when appropriate- per MD notes planning for PEG tube 1/8  Burna SisJenna H. Anjalee Cope, LCSW Clinical Social Worker [phone]

## 2016-05-21 NOTE — Progress Notes (Addendum)
Spoke to Dr Cena BentonVega about arrhythmia possible run v-tach 20 beats rate 150 to 160  V lead showing BBB MD looked at the strip Orders received

## 2016-05-21 NOTE — Progress Notes (Signed)
Daily Progress Note   Patient Name: Catherine Day       Date: 05/21/2016 DOB: August 27, 1927  Age: 83 y.o. MRN#: [id number] Attending Physician: Penny Pia, MD Primary Care Physician: No primary care provider on file. Admit Date: 2016/06/03  Reason for Consultation/Follow-up: Psychosocial/spiritual support. Per the family's request, I revisited patient today to provide support, attempt to educate on care plan, and ensure no progressive symptom burden.  Subjective: Catherine Day was alert and awake in bed on my arrival. She is now in hand mitts due to repeatedly pulling off her telemetry leads, clothes, and anything else within reach. She verbally greeted me on my arrival and denied acute complaints. Despite her increased respiratory rate and audible rhonchi, she denies breathing discomfort or shortness of breath. When asked about the mitts, she responded "they are rather difficult to take off," however she did not seem in distress about them.  Length of Stay: 12  Current Medications: Scheduled Meds:  .  stroke: mapping our early stages of recovery book   Does not apply Once  . aspirin  300 mg Rectal Daily   Or  . aspirin  325 mg Oral Daily  . atorvastatin  40 mg Oral q1800  . azithromycin  500 mg Intravenous Q24H  . carvedilol  3.125 mg Oral BID WC  . cefTRIAXone (ROCEPHIN)  IV  1 g Intravenous Q24H  . enoxaparin (LOVENOX) injection  40 mg Subcutaneous Q24H  . fluticasone furoate-vilanterol  1 puff Inhalation Daily  . guaiFENesin  600 mg Oral BID  . ipratropium-albuterol  3 mL Nebulization TID  . methylPREDNISolone (SOLU-MEDROL) injection  60 mg Intravenous Q12H  . nystatin   Topical TID    Continuous Infusions: . sodium chloride 50 mL/hr at 05/20/16 2247   PRN Meds: acetaminophen **OR**  acetaminophen (TYLENOL) oral liquid 160 mg/5 mL **OR** acetaminophen, food thickener, hydrALAZINE, ipratropium-albuterol, senna-docusate  Physical Exam  Constitutional: Vital signs are normal. She appears ill. No distress. Nasal cannula in place.  Frail and elderly woman resting comfortably in bed  HENT:  Head: Normocephalic and atraumatic.  Eyes: EOM are normal.  Neck: Normal range of motion.  Cardiovascular: Regular rhythm and normal heart sounds.  Bradycardia present.   Pulmonary/Chest: Accessory muscle usage (mild) present. Tachypnea noted. No respiratory distress. She has wheezes (improved form 1/3) in the  right upper field and the left upper field. She has rhonchi (audible) in the right upper field and the left upper field.  Abdominal: Soft. Normal appearance.  Neurological: She is alert.  Oriented to person only.  Skin: Skin is warm and dry. Bruising noted. There is pallor.  BLE with slight purple mottling, marked improvement from prior days.    Psychiatric: She has a normal mood and affect. Her speech is delayed. She is slowed.  Nursing note and vitals reviewed.          Vital Signs: BP (!) 148/58   Pulse 86   Temp 97.4 F (36.3 C) (Oral)   Resp (!) 22   Ht 5\' 2"  (1.575 m)   Wt 74.8 kg (165 lb)   SpO2 99%   BMI 30.18 kg/m  SpO2: SpO2: 99 % O2 Device: O2 Device: Nasal Cannula O2 Flow Rate: O2 Flow Rate (L/min): 3 L/min  Intake/output summary:   Intake/Output Summary (Last 24 hours) at 05/21/16 1325 Last data filed at 05/21/16 0950  Gross per 24 hour  Intake          1373.75 ml  Output                2 ml  Net          1371.75 ml   LBM: Last BM Date: 05/21/16 Baseline Weight: Weight: 73.9 kg (163 lb) Most recent weight: Weight: 74.8 kg (165 lb)       Palliative Assessment/Data: PPS 30%  Flowsheet Rows   Flowsheet Row Most Recent Value  Intake Tab  Referral Department  Hospitalist  Unit at Time of Referral  Intermediate Care Unit  Palliative Care Primary  Diagnosis  Neurology  Palliative Care Type  New Palliative care  Reason for referral  Clarify Goals of Care  Date of Admission  May 13, 2016  Date first seen by Palliative Care  05/11/16  Clinical Assessment  Palliative Performance Scale Score  10%  Psychosocial & Spiritual Assessment  Palliative Care Outcomes  Patient/Family meeting held?  Yes  Who was at the meeting?  son  Palliative Care Outcomes  Clarified goals of care, Provided end of life care assistance, Provided psychosocial or spiritual support, Counseled regarding hospice      Patient Active Problem List   Diagnosis Date Noted  . SOB (shortness of breath)   . Palliative care encounter   . Goals of care, counseling/discussion   . At high risk for aspiration   . Cerebrovascular accident (CVA) ([facility])   . Acute encephalopathy 04/23/2016  . UTI due to extended-spectrum beta lactamase (ESBL) producing Escherichia coli   . Chronic combined systolic and diastolic CHF, NYHA class 1 ([facility])   . Cerebrovascular accident (CVA) due to bilateral thrombosis of vertebral arteries ([facility])   . Hypertension 04/14/2016  . Hyperlipidemia 04/14/2016  . 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 Dysphagia as late effect of cerebrovascular accident (CVA) 04/14/2016  . Chronic obstructive pulmonary disease ([facility])   . Hypokalemia   . Recent Embolic stroke ([facility]) - B MCA and L cerebellar     Palliative Care Assessment & Plan   Patient Profile: 83 y.o. female  with past medical history of COPD on home oxygen and recent stroke in November admitted on 05/13/2016 with fall and worsened right sided deficits. MRI showed infarcts in subcortical left hemisphere. MRA with occlusion of the left M2 division. Repeat CT showed evolving left MCA distribution subacute infarct. Patient failed speech swallow evaluation on  12/24. Of note, she did have a recent hospitalization for stroke requiring tPA and complete revascularization of occluded left MCA,  left ACA, and left ICA in interventional radiology. That prior hospitalization was complicated by ESBL positive UTI and aspiration pneumonia.   Assessment: I spoke with Catherine Day on 1/3 and confirmed that he did want to proceed with a feeding tube to ensure adequate nutrition. This cannot occur until Monday, however, because she needs to have her Plavix held for five days. Her feet have improved, with etiology of the issue remaining elusive. Per her son, this has happened once before on a prior admission.   Today, Catherine Day appears improved in terms of her wakefullness and interest in eating. She did consistently answer my questions, and endorsed no uncontrolled symptoms. I attempted to explain the care plan, including use of steroids, changed antibiotics, and plan for feeding tube placement on Monday. She nodded along to my explanations, but didn't ask any questions nor did she seem to follow the conversation. I suspect her confusion limits her comprehension.   Recommendations/Plan:  DNR/DNI; continue full scope treatment otherwise  Plan for feeding tube placement on Monday  **Goals of care are well established for Catherine Day, and she has no uncontrolled symptoms. Palliative team will follow along peripherally at this point. Please call our team phone at [phone] with acute needs or necessity to reengage. We are happy to assist in any way we can!**  Goals of Care and Additional Recommendations:  Limitations on Scope of Treatment: Full Scope Treatment-except DNR/DNI  Code Status: DNR   Code Status Orders        Start     Ordered   18-May-2016 0909  Do not attempt resuscitation (DNR)  Continuous    Question Answer Comment  In the event of cardiac or respiratory ARREST Do not call a "code blue"   In the event of cardiac or respiratory ARREST Do not perform Intubation, CPR, defibrillation or ACLS   In the event of cardiac or respiratory ARREST Use medication by any route, position, wound  care, and other measures to relive pain and suffering. May use oxygen, suction and manual treatment of airway obstruction as needed for comfort.      05-18-2016 0908    Code Status History    Date Active Date Inactive Code Status Order ID Comments User Context   18-May-2016  8:40 AM 2016-05-18  9:08 AM Full Code [id number]  Russella Dar, NP Inpatient   05-18-16  8:18 AM 05-18-16  8:40 AM DNR [id number]  Russella Dar, NP ED   04/24/2016  4:55 PM 04/27/2016  7:50 PM DNR [id number]  Catarina Hartshorn, MD Inpatient   04/15/2016 12:51 AM 04/24/2016  4:54 PM Full Code [id number]  Duayne Cal, NP Inpatient   04/14/2016  2:58 PM 04/14/2016  2:58 PM Full Code [id number]  Charlton Amor, PA-C Inpatient   04/14/2016  2:58 PM 04/15/2016 12:51 AM Full Code [id number]  Charlton Amor, PA-C Inpatient   04/09/2016 11:12 PM 04/10/2016  9:15 AM Full Code [id number]  Julieanne Cotton, MD Inpatient   04/09/2016 11:12 PM 04/09/2016 11:12 PM Full Code [id number]  Rejeana Brock, MD Inpatient    Advance Directive Documentation   Flowsheet Row Most Recent Value  Type of Advance Directive  Out of facility DNR (pink MOST or yellow form), Healthcare Power of Attorney  Pre-existing out of facility DNR order (yellow form or pink MOST form)  Yellow form placed in chart (order  not valid for inpatient use)  "MOST" Form in Place?  No data       Prognosis:   Unable to determine  Discharge Planning:  Skilled Nursing Facility for rehab with Palliative care service follow-up  Care plan was discussed with pt and care nurse.  Thank you for allowing the Palliative Medicine Team to assist in the care of this patient.   Total time: 35 minutes    Greater than 50%  of this time was spent counseling and coordinating care related to the above assessment and plan.  Murrell ConverseSarah Lisandro Meggett [facility] North[facility]NP-C Palliative Medicine Team  [phone] (cell, 7a-5p) [phone] (team phone)

## 2016-05-21 NOTE — Progress Notes (Signed)
Lab results in - text page to Dr Cena BentonVega to view lab results

## 2016-05-21 NOTE — Progress Notes (Signed)
PT Cancellation Note  Patient Details Name: Catherine ClossRegina Day MRN: [id number] DOB: 09/17/1927   Cancelled Treatment:    Reason Eval/Treat Not Completed: Medical issues which prohibited therapy. Pt's nurse reported that pt with abnormal heart rhythm (? v-tach) and requesting to hold therapy for today. PT will continue to f/u with pt as appropriate.   Alessandra BevelsJennifer M Nalia Honeycutt 05/21/2016, 10:16 AM Deborah ChalkJennifer Ramonda Galyon, PT, DPT [phone]

## 2016-05-22 MED ORDER — MORPHINE SULFATE (PF) 2 MG/ML IV SOLN: 1.0000 mg | INTRAVENOUS | Status: DC | PRN

## 2016-05-22 MED ORDER — GLYCOPYRROLATE 0.2 MG/ML IJ SOLN
0.4000 mg | Freq: Four times a day (QID) | INTRAMUSCULAR | Status: DC
  Filled 2016-05-22: qty 2

## 2016-05-22 MED ORDER — LORAZEPAM 2 MG/ML IJ SOLN: 1.0000 mg | INTRAMUSCULAR | Status: DC | PRN

## 2016-05-22 MED ORDER — GLYCOPYRROLATE 0.2 MG/ML IJ SOLN: 0.4000 mg | INTRAMUSCULAR | Status: DC | PRN

## 2016-05-22 MED ORDER — ONDANSETRON HCL 4 MG/2ML IJ SOLN: 4.0000 mg | Freq: Four times a day (QID) | INTRAMUSCULAR | Status: DC | PRN

## 2016-05-22 MED ORDER — ORAL CARE MOUTH RINSE
15.0000 mL | Freq: Two times a day (BID) | OROMUCOSAL | Status: DC
  Administered 2016-05-22: 15 mL via OROMUCOSAL

## 2016-05-22 MED ORDER — LORAZEPAM 2 MG/ML IJ SOLN
1.0000 mg | Freq: Four times a day (QID) | INTRAMUSCULAR | Status: DC
  Administered 2016-05-22: 1 mg via INTRAVENOUS
  Filled 2016-05-22: qty 1

## 2016-05-23 LAB — CULTURE, RESPIRATORY W GRAM STAIN

## 2016-05-23 LAB — CULTURE, RESPIRATORY

## 2016-05-24 LAB — CULTURE, BLOOD (ROUTINE X 2)
CULTURE: NO GROWTH
Culture: NO GROWTH

## 2016-06-17 ENCOUNTER — Ambulatory Visit: Payer: Self-pay | Admitting: Neurology

## 2016-06-18 NOTE — Progress Notes (Signed)
200 cc of the Morphine Sulfate drip wasted in the sink. TaTa Carbone,RN witnessed the waste.

## 2016-06-18 NOTE — Discharge Summary (Signed)
Death Summary  Catherine ClossRegina Day EXB:[id number] DOB: 09/29/1927 DOA: 04/17/2016  PCP: No primary care provider on file.  Admit date: 04/21/2016 Date of Death: 06/03/2016  Final Diagnoses:  Active Problems:   Recent Embolic stroke ([facility]CC) - B MCA and L cerebellar   Chronic obstructive pulmonary disease ([facility])   Hypokalemia   Hypertension   Hyperlipidemia   Dysphagia as late effect of cerebrovascular accident (CVA)   Chronic combined systolic and diastolic CHF, NYHA class 1 ([facility])   Cerebrovascular accident (CVA) due to bilateral thrombosis of vertebral arteries ([facility])   Cerebrovascular accident (CVA) ([facility])   Palliative care encounter   Goals of care, counseling/discussion   At high risk for aspiration   SOB (shortness of breath)   History of present illness:  83 y/o presenting after Stroke  Hospital Course:  Stroke - Pt dies of complications of stroke sequelae   Aspiration pna - complication of stroke   Signed:  Penny PiaVEGA, Jenika Chiem  [facility] 06/03/2016, 11:42 AM

## 2016-06-18 NOTE — Progress Notes (Signed)
[facility]                                                                              Patient Demographics  Catherine Day, is a 83 y.o. female, DOB - 1927-07-29, NFA:[id number]  Admit date - 05/16/2016   Admitting Physician Haydee Salter, MD  Outpatient Primary MD for the patient is No primary care provider on file.  Outpatient specialists:   LOS - 13  days    Chief Complaint  Patient presents with  . Fall       Brief summary   Patient is a 83 year old female with recent protracted hospitalization for left-sided embolic stroke s/p TPA and revascularization in 03/2016, COPD, O2 dependent, prior hospitalization course complicated by ESBL positive Escherichia coli UTI and aspiration pneumonia. She was discharged to skilled nursing facility on 12//11.  Patient was sent to ER from SNF on 12/23 after a fall. Per ER notes the patient fell about 11:30 PM and at that time it was discovered that the patient was not moving the right side as per previous baseline. MRI showed acute infarcts within the left subinsular white matter, left frontal operculum the left basal ganglia with an area of punctate possible ischemia in the left cerebral peduncle.   Neurology consulted recommended asa 325 and consider loop recorder if family does not choose comfort measures , neurology signed off on 12/28.   Patient was minimal responsive, comfort measures considered initially, from 12/28 patient started to be awake and able to eat and follow commands, family decided for feeding tube and snf placement.  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  venous doppler/abi reporting negative for DVT   Assessment & Plan    Acute CVA in the setting of Recent Embolic stroke (HCC) - B MCA and L cerebellar/Cerebrovascular accident (CVA) due to bilateral thrombosis of vertebral arteries  -Patient recently  admitted for acute stroke requiring tPA as well as complete revascularization of occluded left MCA, left ACA and left ICA terminus in interventional radiology. Baseline deficits prior to this hospitalization included continued right lower extremity weakness with associated gait disturbance and mild to moderate right upper extremity weakness. -Patient now admitted with right-sided hemiparesis, flaccid with findings of new left side multiple area strokes on MRI, also has expressive aphasia - MRA of the brain showed occlusion of the left M2 division, acute infarct on MRIs predominantly in the lateral lenticular striate territory.  -Underwent TEE previous admission with no embolic source found; no indication to repeat echocardiogram this admission -Full stroke evaluation last admission so no indication to repeat carotid duplex, hemoglobin A1c or lipid panel this admission -- she initially failed swallow eval, not interactive, she was on npo , getting rectal asa, Repeat CT head showed evolving left MCA distribution subacute infarct, initially patient did not show any meaningful clinical improvement, palliative care consulted, and comfort care was considered - At this point plan per notes is comfort care given worsening in condition.   Acute encephalopathy with hypoxic respiratory failure, aspiration pneumonia, dysphagia, aspirating -- Comfort care measures to  be employed. Will treat supportively. Palliative to assist with tailoring medication regimen to optimize comfort - currently on morphine gtt  Bilateral purple feet started on 1/2 Resolved with IVF rehydration  FTT: palliative care input appreciated   Code Status: DNR DVT Prophylaxis:  Lovenox  Family Communication: patient    Disposition Plan:   Transfer to palliative floor for continued supportive care measures  Time Spent in minutes   35 minutes  Procedures:  MRI brain MRA brain   Consultants:   Neurology  Palliative  care  Antimicrobials:   IV Zosyn 12/24 to 12/29  augmentin from 12/29   Medications  Scheduled Meds: . mouth rinse  15 mL Mouth Rinse BID   Continuous Infusions: . sodium chloride 50 mL/hr at 05/21/16 1636  . morphine 2 mg/hr (05/21/16 2330)   PRN Meds:.acetaminophen **OR** acetaminophen (TYLENOL) oral liquid 160 mg/5 mL **OR** acetaminophen   Antibiotics   Anti-infectives    Start     Dose/Rate Route Frequency Ordered Stop   05/25/16 0000  ceFAZolin (ANCEF) IVPB 1 g/50 mL premix  Status:  Discontinued     1 g 100 mL/hr over 30 Minutes Intravenous To Radiology 05/21/16 1351 05/21/16 1353   05/20/16 1500  azithromycin (ZITHROMAX) 500 mg in dextrose 5 % 250 mL IVPB  Status:  Discontinued     500 mg 250 mL/hr over 60 Minutes Intravenous Every 24 hours 05/20/16 1413 05/21/16 2303   05/20/16 1500  cefTRIAXone (ROCEPHIN) 1 g in dextrose 5 % 50 mL IVPB  Status:  Discontinued     1 g 100 mL/hr over 30 Minutes Intravenous Every 24 hours 05/20/16 1421 05/21/16 2303   05/15/16 2200  amoxicillin-clavulanate (AUGMENTIN) 875-125 MG per tablet 1 tablet  Status:  Discontinued     1 tablet Oral Every 12 hours 05/15/16 1634 05/20/16 1413   05/10/16 0900  piperacillin-tazobactam (ZOSYN) IVPB 3.375 g  Status:  Discontinued     3.375 g 12.5 mL/hr over 240 Minutes Intravenous Every 8 hours 05/10/16 0859 05/15/16 1633        Subjective:   [name] pt is resting comfortably   Objective:   Vitals:   2016/06/10 0000 2016-06-10 0300 06/10/2016 0520 Jun 10, 2016 0700  BP: (!) 146/67 131/73 122/66   Pulse: (!) 109 (!) 107 (!) 110   Resp: 11 (!) 8 (!) 9   Temp: 98.8 F (37.1 C) 98.7 F (37.1 C)  98.5 F (36.9 C)  TempSrc: Axillary Axillary  Axillary  SpO2: 100% 100% 100%   Weight:      Height:        Intake/Output Summary (Last 24 hours) at 06/10/16 1610 Last data filed at 06-10-16 0100  Gross per 24 hour  Intake          1328.83 ml  Output                0 ml  Net          1328.83  ml     Wt Readings from Last 3 Encounters:  04/30/2016 74.8 kg (165 lb)  04/27/16 74.1 kg (163 lb 5.8 oz)  04/14/16 91.2 kg (201 lb)     Exam  General: resting comfortably with tachypnea  Neck: Supple, no JVD  Cardiovascular: S1 S2 present,    Respiratory:  Non rebreather face mask on, tachypnea, no wheezes   Data Reviewed:  I have personally reviewed following labs   Micro Results Recent Results (from the past 240 hour(s))  Culture, blood (  routine x 2)     Status: None (Preliminary result)   Collection Time: 05/19/16  3:13 PM  Result Value Ref Range Status   Specimen Description BLOOD LEFT ANTECUBITAL  Final   Special Requests IN PEDIATRIC BOTTLE 3CC  Final   Culture NO GROWTH 2 DAYS  Final   Report Status PENDING  Incomplete  Culture, blood (routine x 2)     Status: None (Preliminary result)   Collection Time: 05/19/16  3:17 PM  Result Value Ref Range Status   Specimen Description BLOOD RIGHT ANTECUBITAL  Final   Special Requests IN PEDIATRIC BOTTLE 3CC  Final   Culture NO GROWTH 2 DAYS  Final   Report Status PENDING  Incomplete  Culture, respiratory (NON-Expectorated)     Status: None (Preliminary result)   Collection Time: 05/20/16  2:40 PM  Result Value Ref Range Status   Specimen Description TRACHEAL ASPIRATE  Final   Special Requests NONE  Final   Gram Stain   Final    MODERATE WBC PRESENT, PREDOMINANTLY PMN ABUNDANT GRAM POSITIVE RODS MODERATE GRAM NEGATIVE RODS FEW BUDDING YEAST SEEN    Culture   Final    ABUNDANT ESCHERICHIA COLI REPEATING SUSCEPTIBILITIES    Report Status PENDING  Incomplete    Radiology Reports Ct Abdomen Wo Contrast  Result Date: 05/21/2016 CLINICAL DATA:  Dysphasia. Evaluate anatomy for possible gastrostomy tube placement. EXAM: CT ABDOMEN WITHOUT CONTRAST TECHNIQUE: Multidetector CT imaging of the abdomen was performed following the standard protocol without IV contrast. COMPARISON:  None. FINDINGS: Lower chest: Motion artifact  at the lung bases. Few patchy densities in the right lower lobe of uncertain chronicity. Findings could be related to aspiration. No large pleural effusions. There is at least mild bronchiectasis in the lower lobes. Few small nodular densities at the lung bases are probably incidental findings, largest measuring 5 mm on sequence 205, image 14 at the right lung base. Hepatobiliary: Mild distention of the gallbladder with layering high density. Findings probably represent gallbladder sludge. Normal appearance of the liver without significant biliary dilatation. Pancreas: Normal appearance of the pancreas without inflammation or duct dilatation. Spleen: Small calcifications in the spleen compatible with old granulomatous disease. No significant splenic enlargement. Adrenals/Urinary Tract: Normal adrenal glands. Poorly defined low-density structure in the medial right kidney measuring up to 1.8 cm could represent a cyst but indeterminate. There may be additional renal cysts. Stomach/Bowel: Evidence for small hiatal hernia. Normal stomach configuration. The stomach configuration is suitable for percutaneous gastrostomy tube placement. The transverse colon is caudal to the stomach. The splenic flexure is the only segment of colon which is a potential problem for a percutaneous gastrostomy tube placement. There is high-density material within the visualized sigmoid colon which could be related to previous contrast administration. No evidence for bowel dilatation or focal inflammation. Vascular/Lymphatic: Aorta is heavily calcified without aneurysm. No significant lymph node enlargement in the abdomen. Other: No evidence for ascites.  No free air. Musculoskeletal: There is mild curvature in the spine. Multilevel degenerative disease in the thoracic and lumbar spine. IMPRESSION: The stomach and bowel anatomy is suitable for percutaneous gastrostomy tube placement. Visualization of the splenic flexure may be useful during  percutaneous gastrostomy tube placement. Patchy densities at the right lung base of unknown age. Findings could be related to inflammatory changes and aspiration. Small nodular densities at lung bases are nonspecific but probably postinflammatory. Largest pulmonary nodule measures 5 mm. No follow-up needed if patient is low-risk (and has no known or suspected primary  neoplasm). Non-contrast chest CT can be considered in 12 months if patient is high-risk. This recommendation follows the consensus statement: Guidelines for Management of Incidental Pulmonary Nodules Detected on CT Images: From the Fleischner Society 2017; Radiology 2017; 284:228-243. Electronically Signed   By: Richarda OverlieAdam  Henn M.D.   On: 05/21/2016 08:36   Dg Chest 2 View  Result Date: Apr 21, 2016 CLINICAL DATA:  83 year old female with fall.  History of COPD. EXAM: CHEST  2 VIEW COMPARISON:  Chest radiograph dated 06/25/2015 FINDINGS: There is shallow inspiration. Bibasilar streaky densities most compatible with atelectatic changes/ scarring. Infiltrate is less likely. No pleural effusion or pneumothorax. Stable cardiac silhouette. The aorta is tortuous. There is osteopenia with degenerative changes of the spine. No acute osseous pathology. IMPRESSION: Shallow inspiration with bibasilar atelectatic changes. Infiltrate is less likely. Electronically Signed   By: Elgie CollardArash  Radparvar M.D.   On: Apr 21, 2016 03:05   Ct Head Wo Contrast  Result Date: 05/10/2016 CLINICAL DATA:  Acute encephalopathy. Recent left MCA distribution infarct. EXAM: CT HEAD WITHOUT CONTRAST TECHNIQUE: Contiguous axial images were obtained from the base of the skull through the vertex without intravenous contrast. COMPARISON:  MRI of Apr 21, 2016.  CT Apr 21, 2016. FINDINGS: Brain: Expected cerebral volume loss for age. Moderate low density in the periventricular white matter likely related to small vessel disease. Evolution of left MCA distribution infarct, slightly more well-defined  today. Examples in the lateral left basal ganglia, subinsular region, and extending into the periventricular white matter of the left frontal lobe. No significant mass effect or midline shift. No complicating hemorrhage, hydrocephalus, intra-axial, or extra-axial fluid collection. Vascular: Intracranial carotid atherosclerosis. Skull: Normal Sinuses/Orbits: Normal orbits and globes. Minimal ethmoid air cell mucosal thickening. Chronic bilateral mastoid effusions are unchanged. Other: None IMPRESSION: 1. Evolving left MCA distribution subacute infarct. 2. Chronic mastoid fluid bilaterally. Electronically Signed   By: Jeronimo GreavesKyle  Talbot M.D.   On: 05/10/2016 17:56   Ct Head Wo Contrast  Result Date: Apr 21, 2016 CLINICAL DATA:  83 year old female with fall and laceration to back of the head. EXAM: CT HEAD WITHOUT CONTRAST CT CERVICAL SPINE WITHOUT CONTRAST TECHNIQUE: Multidetector CT imaging of the head and cervical spine was performed following the standard protocol without intravenous contrast. Multiplanar CT image reconstructions of the cervical spine were also generated. COMPARISON:  Brain MRI dated 04/26/2016 and head CT dated 04/09/2016 FINDINGS: CT HEAD FINDINGS Brain: There is moderate age-related atrophy and chronic microvascular ischemic changes. Focal area of hypodensity in the left corona radiata extending from the left lateral ventricle inferiorly (series 201 images 16-20) appears new since the prior CT and may represent stop a subacute and less likely an acute infarct. Associated mild edema. Correlation with clinical exam recommended. MRI is recommended if there is clinical concern for an acute infarct. There is no acute intracranial hemorrhage. No mass effect or midline shift noted. Vascular: No hyperdense vessel or unexpected calcification. Skull: Normal. Negative for fracture or focal lesion. Sinuses/Orbits: There is partial opacification of multiple ethmoid air cells. No air-fluid levels. There is  opacification of the mastoid air cells bilaterally. Other: Small right forehead scalp contusion. CT CERVICAL SPINE FINDINGS Alignment: Normal. Skull base and vertebrae: No acute fracture. No primary bone lesion or focal pathologic process. Soft tissues and spinal canal: No prevertebral fluid or swelling. No visible canal hematoma. Disc levels: Multilevel degenerative changes with mild facet hypertrophy. Upper chest: Negative. Other: None IMPRESSION: No acute intracranial hemorrhage. Focal area of hypodensity extending from the lateral aspect of the left lateral ventricle inferiorly  appears new since the prior CT and may represent a subacute or less likely an acute infarct. Correlation with clinical exam recommended. MRI is recommended if there is clinical concern for acute infarct. Moderate age-related atrophy and chronic microvascular ischemic changes. No acute/ traumatic cervical spine pathology. Electronically Signed   By: Elgie Collard M.D.   On: 06-07-2016 03:38   Ct Cervical Spine Wo Contrast  Result Date: 06/07/16 CLINICAL DATA:  83 year old female with fall and laceration to back of the head. EXAM: CT HEAD WITHOUT CONTRAST CT CERVICAL SPINE WITHOUT CONTRAST TECHNIQUE: Multidetector CT imaging of the head and cervical spine was performed following the standard protocol without intravenous contrast. Multiplanar CT image reconstructions of the cervical spine were also generated. COMPARISON:  Brain MRI dated 04/26/2016 and head CT dated 04/09/2016 FINDINGS: CT HEAD FINDINGS Brain: There is moderate age-related atrophy and chronic microvascular ischemic changes. Focal area of hypodensity in the left corona radiata extending from the left lateral ventricle inferiorly (series 201 images 16-20) appears new since the prior CT and may represent stop a subacute and less likely an acute infarct. Associated mild edema. Correlation with clinical exam recommended. MRI is recommended if there is clinical concern  for an acute infarct. There is no acute intracranial hemorrhage. No mass effect or midline shift noted. Vascular: No hyperdense vessel or unexpected calcification. Skull: Normal. Negative for fracture or focal lesion. Sinuses/Orbits: There is partial opacification of multiple ethmoid air cells. No air-fluid levels. There is opacification of the mastoid air cells bilaterally. Other: Small right forehead scalp contusion. CT CERVICAL SPINE FINDINGS Alignment: Normal. Skull base and vertebrae: No acute fracture. No primary bone lesion or focal pathologic process. Soft tissues and spinal canal: No prevertebral fluid or swelling. No visible canal hematoma. Disc levels: Multilevel degenerative changes with mild facet hypertrophy. Upper chest: Negative. Other: None IMPRESSION: No acute intracranial hemorrhage. Focal area of hypodensity extending from the lateral aspect of the left lateral ventricle inferiorly appears new since the prior CT and may represent a subacute or less likely an acute infarct. Correlation with clinical exam recommended. MRI is recommended if there is clinical concern for acute infarct. Moderate age-related atrophy and chronic microvascular ischemic changes. No acute/ traumatic cervical spine pathology. Electronically Signed   By: Elgie Collard M.D.   On: 06/07/16 03:38   Mr Maxine Glenn Head Wo Contrast  Result Date: 05/10/2016 CLINICAL DATA:  Acute left MCA infarct. EXAM: MRA NECK WITHOUT AND WITH CONTRAST MRA HEAD WITHOUT CONTRAST TECHNIQUE: Multiplanar and multiecho pulse sequences of the neck were obtained without and with intravenous contrast. Angiographic images of the neck were obtained using MRA technique without and with intravenous contast.; Angiographic images of the Circle of Willis were obtained using MRA technique without intravenous contrast. CONTRAST:  14mL MULTIHANCE GADOBENATE DIMEGLUMINE 529 MG/ML IV SOLN COMPARISON:  None. FINDINGS: MRA NECK FINDINGS The study is moderately  motion degraded mainly in the lower neck/ upper chest. Presumed standard 3 vessel aortic arch branching, though the origin of the left vertebral artery is not established on this study due to artifact. The brachiocephalic and subclavian arteries are grossly patent. However, motion artifact completely obscures the region of the distal brachiocephalic artery and proximal right common carotid artery. Artifact through this level also obscures a portion of the mid left common carotid artery as well as both proximal V1 segments. The portions of the common carotid arteries which are well-visualized are widely patent. The internal carotid arteries are patent bilaterally without evidence of significant stenosis.  The distal right cervical ICA is tortuous. The vertebral arteries are patent and codominant. There is no evidence of flow limiting stenosis in the V2 or V3 segments. There are areas of mild-to-moderate narrowing versus artifact in the V3 segments. The V1 segments are not well evaluated. MRA HEAD FINDINGS The study is mildly motion degraded. The visualized distal vertebral arteries are patent to the basilar and codominant. Right PICA origin is patent. Left PICA is not identified, nor are AICAs. Basilar artery is widely patent. SCA origins are patent. There is a fetal type origin of the left PCA with absent left P1. The PCAs are patent without evidence of significant proximal stenosis. Internal carotid arteries are patent from skullbase to carotid termini without evidence of significant stenosis. ACAs and right MCA are patent without evidence of major branch occlusion or flow limiting proximal stenosis. The left M1 segment is patent with at most mild narrowing distally. There is occlusion of the left M2 inferior division at its origin. There is mild reconstitution of flow and some more distal branches. Flow is present in the left M2 superior division, though it has an attenuated appearance. No intracranial aneurysm is  identified. IMPRESSION: 1. Occlusion of the left M2 inferior division. Note that the acute infarct on yesterday's MRI is predominantly in the lateral lenticulostriate territory however. 2. Motion degraded neck MRA with limited assessment of the large vessels in the upper chest and lower neck. No evidence of flow limiting cervical carotid or vertebral artery stenosis within this limitation. Electronically Signed   By: Sebastian Ache M.D.   On: 05/10/2016 08:08   Mr Maxine Glenn Neck W Wo Contrast  Result Date: 05/10/2016 CLINICAL DATA:  Acute left MCA infarct. EXAM: MRA NECK WITHOUT AND WITH CONTRAST MRA HEAD WITHOUT CONTRAST TECHNIQUE: Multiplanar and multiecho pulse sequences of the neck were obtained without and with intravenous contrast. Angiographic images of the neck were obtained using MRA technique without and with intravenous contast.; Angiographic images of the Circle of Willis were obtained using MRA technique without intravenous contrast. CONTRAST:  14mL MULTIHANCE GADOBENATE DIMEGLUMINE 529 MG/ML IV SOLN COMPARISON:  None. FINDINGS: MRA NECK FINDINGS The study is moderately motion degraded mainly in the lower neck/ upper chest. Presumed standard 3 vessel aortic arch branching, though the origin of the left vertebral artery is not established on this study due to artifact. The brachiocephalic and subclavian arteries are grossly patent. However, motion artifact completely obscures the region of the distal brachiocephalic artery and proximal right common carotid artery. Artifact through this level also obscures a portion of the mid left common carotid artery as well as both proximal V1 segments. The portions of the common carotid arteries which are well-visualized are widely patent. The internal carotid arteries are patent bilaterally without evidence of significant stenosis. The distal right cervical ICA is tortuous. The vertebral arteries are patent and codominant. There is no evidence of flow limiting stenosis  in the V2 or V3 segments. There are areas of mild-to-moderate narrowing versus artifact in the V3 segments. The V1 segments are not well evaluated. MRA HEAD FINDINGS The study is mildly motion degraded. The visualized distal vertebral arteries are patent to the basilar and codominant. Right PICA origin is patent. Left PICA is not identified, nor are AICAs. Basilar artery is widely patent. SCA origins are patent. There is a fetal type origin of the left PCA with absent left P1. The PCAs are patent without evidence of significant proximal stenosis. Internal carotid arteries are patent from skullbase to  carotid termini without evidence of significant stenosis. ACAs and right MCA are patent without evidence of major branch occlusion or flow limiting proximal stenosis. The left M1 segment is patent with at most mild narrowing distally. There is occlusion of the left M2 inferior division at its origin. There is mild reconstitution of flow and some more distal branches. Flow is present in the left M2 superior division, though it has an attenuated appearance. No intracranial aneurysm is identified. IMPRESSION: 1. Occlusion of the left M2 inferior division. Note that the acute infarct on yesterday's MRI is predominantly in the lateral lenticulostriate territory however. 2. Motion degraded neck MRA with limited assessment of the large vessels in the upper chest and lower neck. No evidence of flow limiting cervical carotid or vertebral artery stenosis within this limitation. Electronically Signed   By: Sebastian Ache M.D.   On: 05/10/2016 08:08   Mr Brain Wo Contrast  Result Date: 04/21/2016 CLINICAL DATA:  Fall with right-sided weakness. EXAM: MRI HEAD WITHOUT CONTRAST TECHNIQUE: Multiplanar, multiecho pulse sequences of the brain and surrounding structures were obtained without intravenous contrast. COMPARISON:  Head CT 04/24/2016 Brain MR 04/26/2016 FINDINGS: Brain: There are areas of diffusion restriction within the  left caudate head, extending into the putamen; the left frontal operculum; and of the subinsular white matter. The abnormality is in close proximity to the posterior limb of the internal capsule. There is also a possible focus of diffusion restriction within the left cerebral peduncle. No acute hemorrhage. There is beginning confluent hyperintense T2-weighted signal within the periventricular white matter, most often seen in the setting of chronic microvascular ischemia. No mass lesion or midline shift. No hydrocephalus or extra-axial fluid collection. The midline structures are normal. No age advanced or lobar predominant atrophy. Vascular: Major intracranial arterial and venous sinus flow voids are preserved. No evidence of chronic microhemorrhage or amyloid angiopathy. Skull and upper cervical spine: The visualized skull base, calvarium, upper cervical spine and extracranial soft tissues are normal. Sinuses/Orbits: Bilateral mastoid effusions. Paranasal sinuses are clear. Normal orbits. IMPRESSION: 1. Acute infarct within the left subinsular white matter, left frontal operculum and left basal ganglia (predominantly the caudate head and putamen). No hemorrhage, midline shift or mass effect. 2. Punctate focus of possible ischemia within the left cerebral peduncle. 3. Chronic microvascular disease. 4. Bilateral mastoid effusions. Electronically Signed   By: Deatra Robinson M.D.   On: 04/25/2016 05:39   Mr Brain Wo Contrast  Result Date: 04/26/2016 CLINICAL DATA:  Worsening hearing loss over the past 4-5 days. Stroke last month. EXAM: MRI HEAD WITHOUT CONTRAST TECHNIQUE: Multiplanar, multiecho pulse sequences of the brain and surrounding structures were obtained without intravenous contrast. COMPARISON:  04/10/2016 FINDINGS: Brain: Small volume blood products are again seen in the mesial left temporal lobe extending to the region of inferior basal ganglia/ genu of the left internal capsule. Minimal residual  diffusion signal abnormality is present in these locations related to the blood products. Abnormal trace diffusion signal elsewhere in the left greater than right cerebral hemispheres and left cerebellum on the prior study has resolved. There is minimal T2/FLAIR hyperintensity in the mesial left temporal lobe/ basal ganglia region and left temporal occipital region corresponding to the previously demonstrated infarcts. There is no evidence of acute infarct, new intracranial hemorrhage, mass, midline shift, or extra-axial fluid collection. Bilateral periventricular white matter T2 hyperintensities are unchanged and nonspecific but compatible with mild chronic small vessel ischemic disease. There is moderate cerebral atrophy. Vascular: Major intracranial vascular flow voids  are preserved. Skull and upper cervical spine: Unremarkable bone marrow signal. Sinuses/Orbits: Unremarkable orbits. Minimal posterior right ethmoid air cell mucosal thickening. Large bilateral mastoid effusions, increased from prior. Fluid is now present in the middle ear cavities bilaterally. Other: None. IMPRESSION: 1. Residual small volume blood products in the mesial left temporal lobe and left internal capsule/inferior basal ganglia region associated with subacute infarcts demonstrated on the prior MRI. 2. Resolved diffusion abnormality associated with the small infarcts elsewhere. 3. No acute infarct or new intracranial abnormality. 4. Large bilateral mastoid and middle ear effusions. Electronically Signed   By: Sebastian Ache M.D.   On: 04/26/2016 19:03   Dg Chest Port 1 View  Result Date: 05/21/2016 CLINICAL DATA:  Aspiration pneumonia.  Hemoptysis. EXAM: PORTABLE CHEST 1 VIEW COMPARISON:  05/20/2016 FINDINGS: Linear opacities in both bases are unchanged and likely represent scarring or atelectasis. No confluent alveolar consolidation. No large effusion. Unchanged aortic tortuosity. IMPRESSION: Scarring or atelectasis in both bases.  No  confluent consolidation. Electronically Signed   By: Ellery Plunk M.D.   On: 05/21/2016 21:52   Dg Chest Port 1 View  Result Date: 05/20/2016 CLINICAL DATA:  Cough EXAM: PORTABLE CHEST 1 VIEW COMPARISON:  May 18, 2016 and April 23, 2016 FINDINGS: There is scarring in the bases, more on the left than on the right. There is no edema or consolidation. The heart size and pulmonary vascularity are normal. No adenopathy. There is atherosclerotic calcification in the aorta. Bones appear somewhat osteoporotic. IMPRESSION: Bibasilar scarring. No edema or consolidation. Aortic atherosclerosis. Bones osteoporotic. Electronically Signed   By: Bretta Bang III M.D.   On: 05/20/2016 15:03   Dg Chest Port 1 View  Result Date: 05/18/2016 CLINICAL DATA:  Patient with aspiration. EXAM: PORTABLE CHEST 1 VIEW COMPARISON:  Chest radiograph 05/15/2016. FINDINGS: Stable cardiac and mediastinal contours with tortuosity and calcification of the thoracic aorta. Scarring within the right-greater-than-left lower lungs. No large area of pulmonary consolidation. No pleural effusion or pneumothorax. IMPRESSION: No acute cardiopulmonary process. Basilar atelectasis and/or scarring. Electronically Signed   By: Annia Belt M.D.   On: 05/18/2016 16:33   Dg Chest Port 1 View  Result Date: 05/15/2016 CLINICAL DATA:  Short of breath. EXAM: PORTABLE CHEST 1 VIEW COMPARISON:  05/10/2016 FINDINGS: Cardiac silhouette is normal in size. No mediastinal or hilar masses. No convincing adenopathy. Linear and reticular opacities are noted at the lung bases, similar to the prior study, consistent with a combination scarring and subsegmental atelectasis. Lung base and perihilar chronic bronchitic changes also stable. There is no evidence of pneumonia or pulmonary edema. No convincing pleural effusion. No pneumothorax. Skeletal structures are demineralized but grossly intact. IMPRESSION: 1. No acute cardiopulmonary disease. 2. Stable  appearance from the prior study. Electronically Signed   By: Amie Portland M.D.   On: 05/15/2016 17:08   Dg Chest Port 1 View  Result Date: 05/12/2016 CLINICAL DATA:  Shortness of Breath following recent fall EXAM: PORTABLE CHEST 1 VIEW COMPARISON:  04/24/2016 FINDINGS: Cardiac shadow is stable. Bibasilar atelectatic changes are noted slightly more prominent than that seen on the prior exam. No pneumothorax or sizable effusion is seen. No bony abnormality is noted. IMPRESSION: Mild bibasilar atelectasis. Electronically Signed   By: Alcide Clever M.D.   On: 05/13/2016 21:56   Dg Chest Port 1 View  Result Date: 04/23/2016 CLINICAL DATA:  Aspiration pneumonia EXAM: PORTABLE CHEST 1 VIEW COMPARISON:  04/23/2016 at 12:20 FINDINGS: Mild streaky opacity in the right days. Left lung  is clear. Pulmonary vasculature is normal. No effusions. Hilar, mediastinal and cardiac contours are unremarkable and unchanged. IMPRESSION: Streaky right base opacity. No focal confluent consolidation. No significant change in the interim. Electronically Signed   By: Ellery Plunk M.D.   On: 04/23/2016 23:41   Dg Chest Port 1 View  Result Date: 04/23/2016 CLINICAL DATA:  COPD, aspiration pneumonia. EXAM: PORTABLE CHEST 1 VIEW COMPARISON:  Portable chest x-ray of Sep 20, 2015 FINDINGS: The lungs are mildly hyperinflated. The interstitial markings are coarse. Increased density at the right lung base is consistent with the clinically suspected aspiration pneumonia. The heart and pulmonary vascularity are normal. There is calcification in the wall of the tortuous thoracic aorta. There is no pleural effusion. The bony thorax exhibits no acute abnormality. IMPRESSION: Right basilar pneumonia consistent with aspiration. COPD. Thoracic aortic atherosclerosis. When the patient can tolerate the procedure, a PA and lateral chest x-ray would be useful. Electronically Signed   By: David  [place] M.D.   On: 04/23/2016 12:34   Dg Chest Port 1v  Same Day  Result Date: 05/10/2016 CLINICAL DATA:  Dyspnea, history COPD, stroke EXAM: PORTABLE CHEST 1 VIEW COMPARISON:  Portable exam 0932 hours compared to 04/24/2016 FINDINGS: Upper normal heart size. Rotated to the RIGHT. Mediastinal contours and pulmonary vascularity normal. Atherosclerotic calcification aorta. Bronchitic changes with bibasilar atelectasis. Remaining lungs clear. No pleural effusion or pneumothorax. IMPRESSION: Bronchitic changes with bibasilar atelectasis. Aortic atherosclerosis. Electronically Signed   By: Ulyses Southward M.D.   On: 05/10/2016 09:53   Dg Knee Complete 4 Views Right  Result Date: 04/25/2016 CLINICAL DATA:  83 year old female with fall and right knee swelling. EXAM: RIGHT KNEE - COMPLETE 4+ VIEW COMPARISON:  None. FINDINGS: No acute fracture or dislocation. The bones are osteopenic. There is meniscal chondrocalcinosis with mild osteoarthritic changes of the knees. No joint effusion. The soft tissues appear unremarkable. IMPRESSION: No acute fracture or dislocation. Electronically Signed   By: Elgie Collard M.D.   On: 04/23/2016 03:04    Lab Data:  CBC:  Recent Labs Lab 05/16/16 0517 05/19/16 0611 05/20/16 0200 05/21/16 1156  WBC 9.9 12.9* 13.4* 22.7*  NEUTROABS  --   --   --  21.8*  HGB 11.4* 12.1 12.2 11.9*  HCT 36.5 39.5 39.7 39.4  MCV 87.3 90.6 89.8 90.6  PLT 309 427* 412* 441*   Basic Metabolic Panel:  Recent Labs Lab 05/15/16 2013 05/16/16 0517 05/17/16 0639 05/18/16 0231 05/19/16 0611 05/20/16 0200 05/21/16 1156  NA 142 146* 144 146* 144 145 149*  K 3.2* 3.3* 4.2 4.3 5.2* 5.2* 3.7  CL 105 107 108 109 106 110 111  CO2 22 26 28 27 29 26 28   GLUCOSE 108* 107* 157* 160* 151* 180* 324*  BUN 21* 18 23* 32* 35* 43* 36*  CREATININE 0.70 0.67 0.55 0.60 0.62 0.58 0.72  CALCIUM 7.5* 7.3* 7.5* 7.9* 7.8* 8.1* 7.5*  MG 2.0 2.4  --  2.9*  --   --  2.3  PHOS  --   --   --   --   --   --  3.4   GFR: Estimated Creatinine Clearance: 47.8  mL/min (by C-G formula based on SCr of 0.72 mg/dL). Liver Function Tests:  Recent Labs Lab 05/16/16 0517  AST 20  ALT 13*  ALKPHOS 54  BILITOT 0.7  PROT 6.9  ALBUMIN 2.5*   No results for input(s): LIPASE, AMYLASE in the last 168 hours. No results for input(s): AMMONIA in the  last 168 hours. Coagulation Profile: No results for input(s): INR, PROTIME in the last 168 hours. Cardiac Enzymes: No results for input(s): CKTOTAL, CKMB, CKMBINDEX, TROPONINI in the last 168 hours. BNP (last 3 results) No results for input(s): PROBNP in the last 8760 hours. HbA1C: No results for input(s): HGBA1C in the last 72 hours. CBG:  Recent Labs Lab 05/21/16 1757 05/21/16 1946 05/21/16 2130  GLUCAP 300* 231* 181*   Lipid Profile: No results for input(s): CHOL, HDL, LDLCALC, TRIG, CHOLHDL, LDLDIRECT in the last 72 hours. Thyroid Function Tests: No results for input(s): TSH, T4TOTAL, FREET4, T3FREE, THYROIDAB in the last 72 hours. Anemia Panel: No results for input(s): VITAMINB12, FOLATE, FERRITIN, TIBC, IRON, RETICCTPCT in the last 72 hours. Urine analysis:    Component Value Date/Time   COLORURINE YELLOW 05/12/2016 0419   APPEARANCEUR HAZY (A) 05/13/2016 0419   LABSPEC 1.020 04/20/2016 0419   PHURINE 5.0 04/21/2016 0419   GLUCOSEU NEGATIVE 04/25/2016 0419   HGBUR NEGATIVE 05/01/2016 0419   BILIRUBINUR NEGATIVE 05/03/2016 0419   KETONESUR NEGATIVE 04/19/2016 0419   PROTEINUR NEGATIVE 05/13/2016 0419   NITRITE NEGATIVE 05/15/2016 0419   LEUKOCYTESUR NEGATIVE 05/16/2016 0419   Time spent: 15 mins  Penny Pia M.D. PhD [facility] 06-10-2016, 9:52 AM   Between 7am to 7pm - call Pager - [phone]  After 7pm go to [email or web address] - password [email or web address]  Call night coverage person covering after 7pm  Addendum: Added SSI/placed on diabetic diet Hypokalemia resolved Leukocytosis: will continue current antibiotic regimen. Hypernatremia - place on 1/2 normal saline

## 2016-06-18 NOTE — Progress Notes (Signed)
Palliative Medicine RN Note: Follow up on overnight events. Son is at bedside. Pt has NRB on and is non-responsive; morphine drip is running. Pt has pending order to transfer to [facility] for EOL care.  Discussed decline/changes/prognosis with son. He expressed surprise that pt made it through the night. Explained that time is, indeed, very short, minutes to hours. He verbalized understanding of prognosis and of s/s distress.   Discussed pt with Dr Phillips OdorGolding and initiated orders from [facility] EOL order set. [facility] member will f/u tomorrow to ensure symptoms remain controlled.  Catherine ChanceMelanie G. Toluwani Yadav, RN, BSN, [facility] 05/21/2016 1:52 PM Cell [phone] 8:00-4:00 Monday-Friday Office [phone]

## 2016-06-18 DEATH — deceased

## 2018-07-16 IMAGING — CR DG CHEST 1V PORT
1 series · 1 of 1 positions shown · non-contrast
Comparison: None.

CLINICAL DATA: Endotracheal tube and orogastric tube placement.
Initial encounter.

EXAM:
PORTABLE CHEST 1 VIEW

[AP]
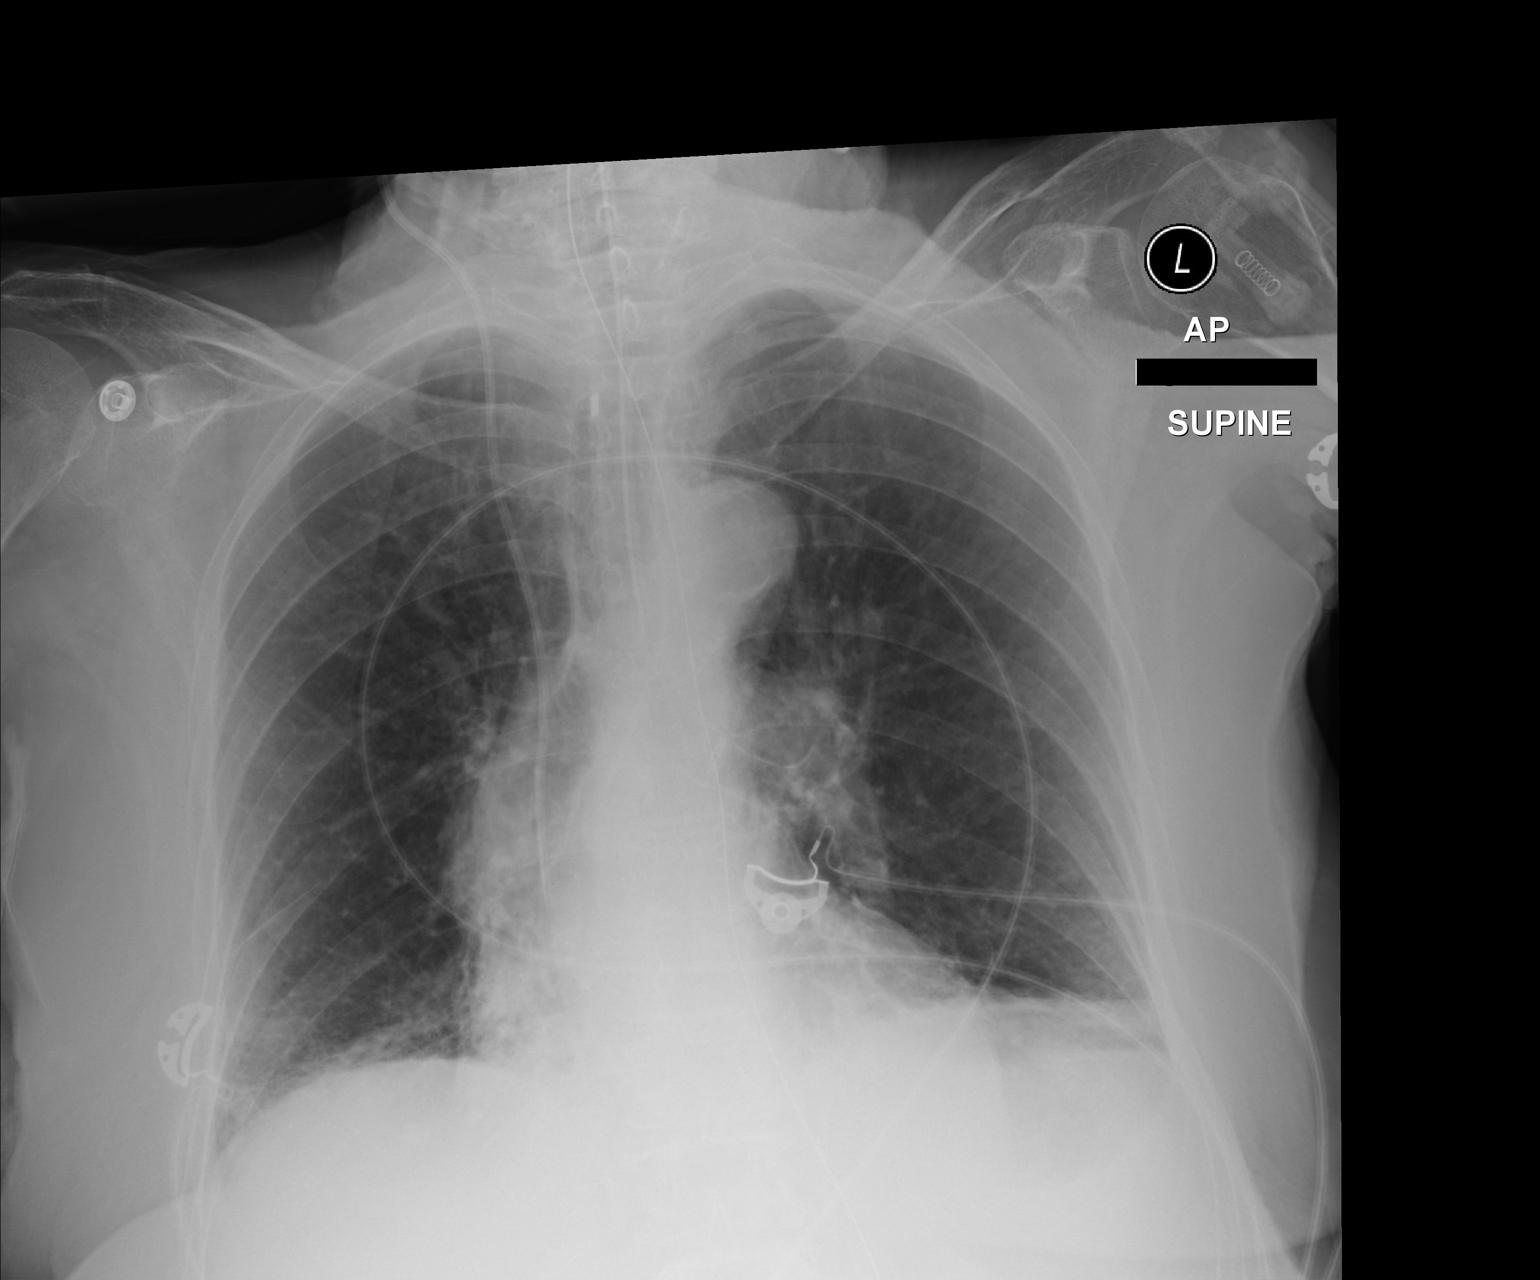

[1 of 1 positions shown; findings below may reference images not displayed]

FINDINGS: The patient's endotracheal tube is seen ending 2 cm above the
carina. A right IJ line is noted ending about the distal SVC. An
enteric tube is noted extending below the diaphragm.

Mild bibasilar atelectasis is noted. No pleural effusion or
pneumothorax is seen.

The cardiomediastinal silhouette is borderline normal in size. No
acute osseous abnormalities are identified.
IMPRESSION: 1. Endotracheal tube seen ending 2 cm above the carina.
2. Right IJ line noted ending about the distal SVC.
3. Enteric tube noted extending below the diaphragm.
4. Mild bibasilar atelectasis.

## 2018-07-19 IMAGING — RF DG SWALLOWING FUNCTION - [facility]PT [facility]
1 series · 18 of 24 positions shown · non-contrast
Comparison: none

[Series 1: run · 25 acquisitions, 18 frames shown]
[im 1/25]
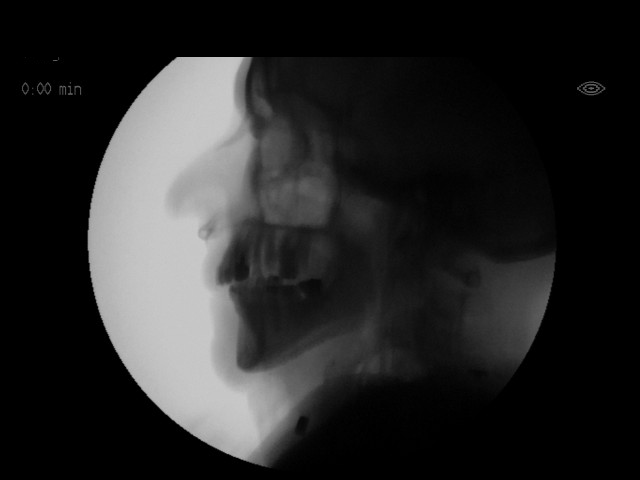
[im 3/25]
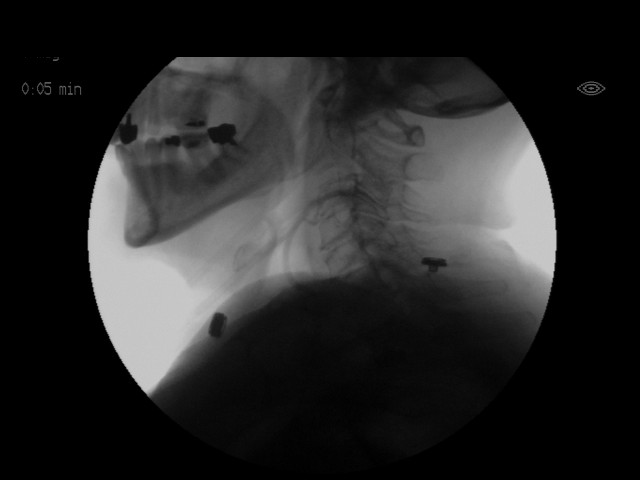
[im 4/25]
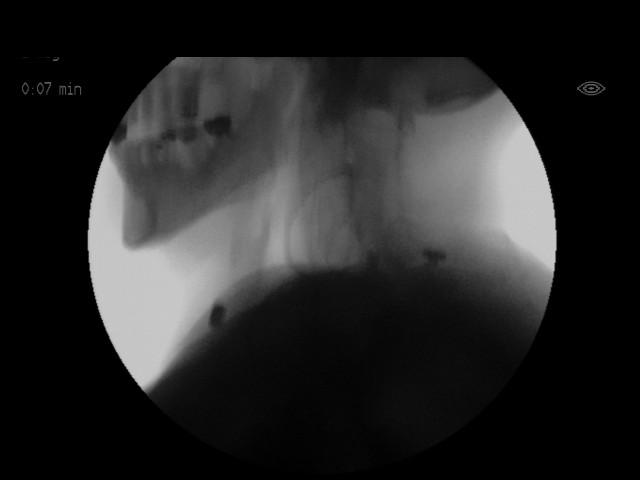
[im 5/25]
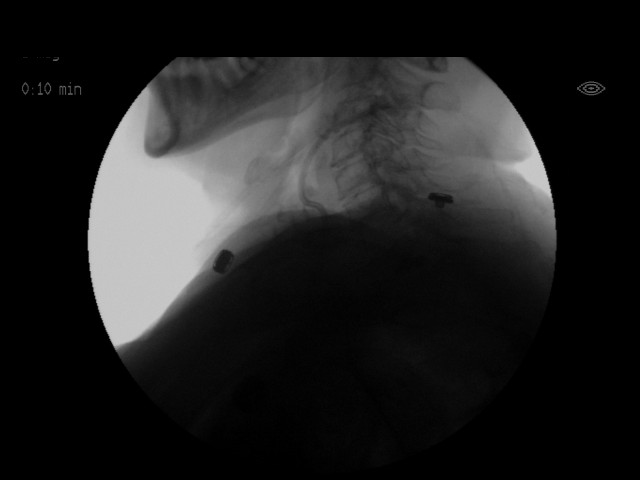
[im 7/25]
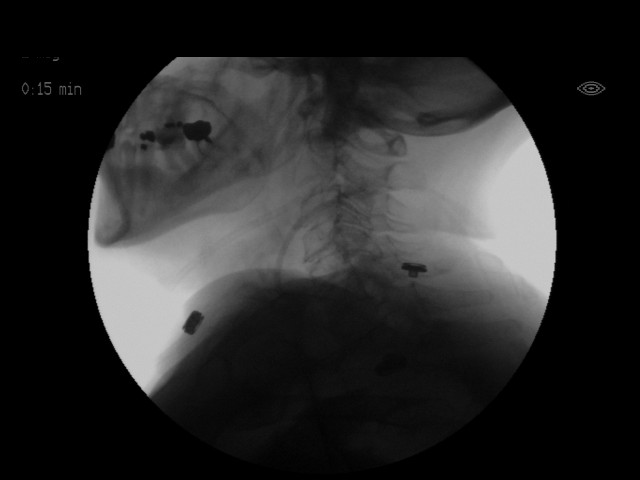
[im 8/25]
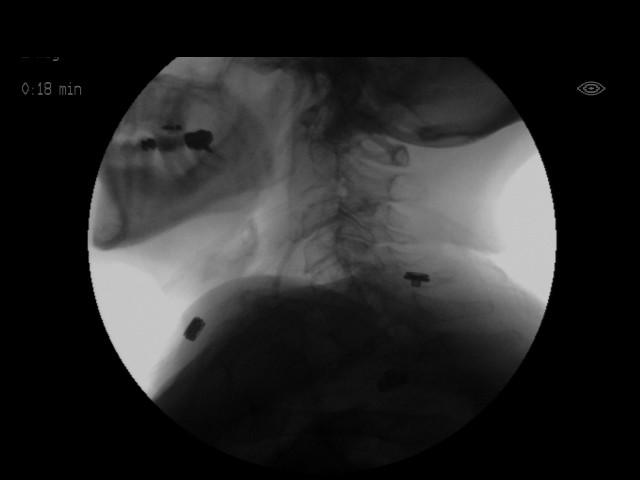
[im 9/25]
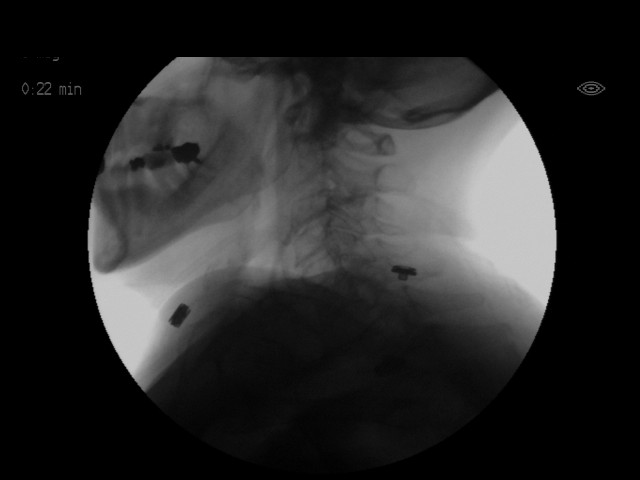
[im 11/25]
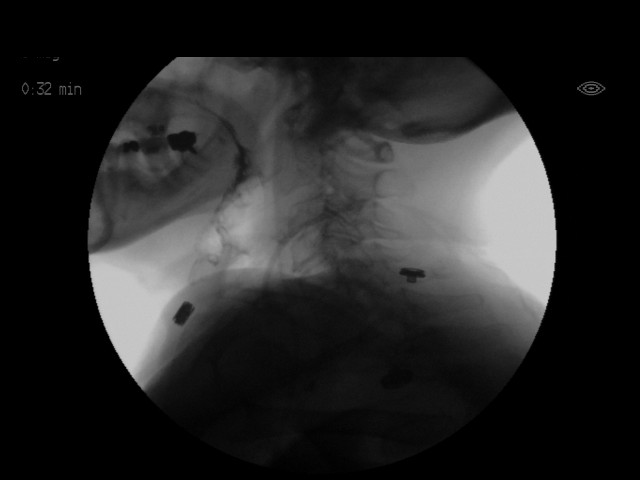
[im 12/25]
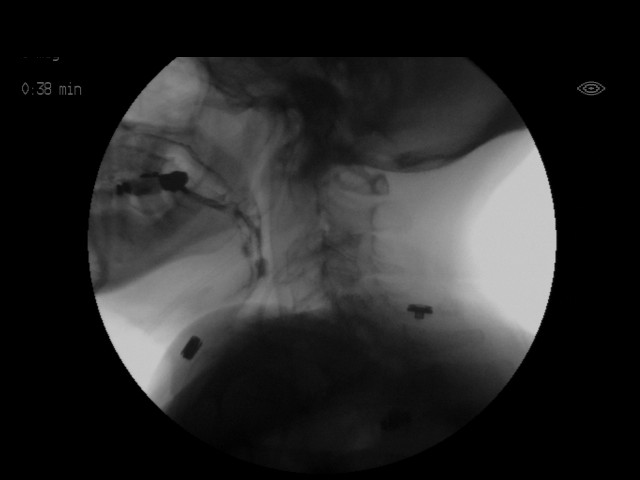
[im 13/25]
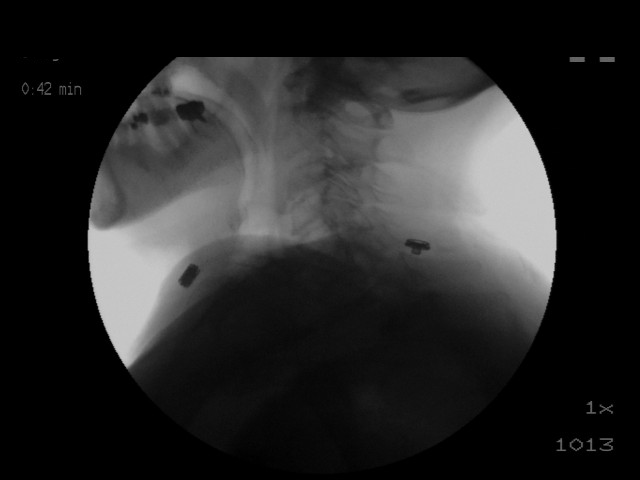
[im 15/25]
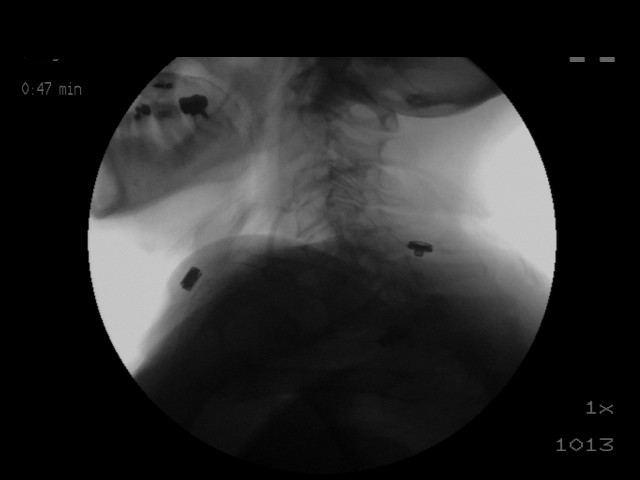
[im 16/25]
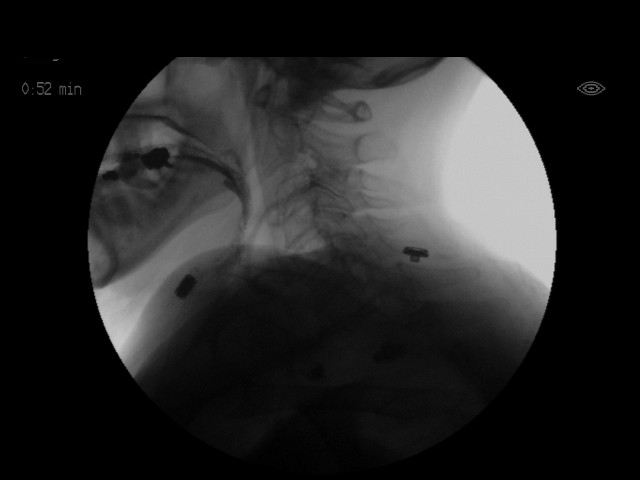
[im 17/25]
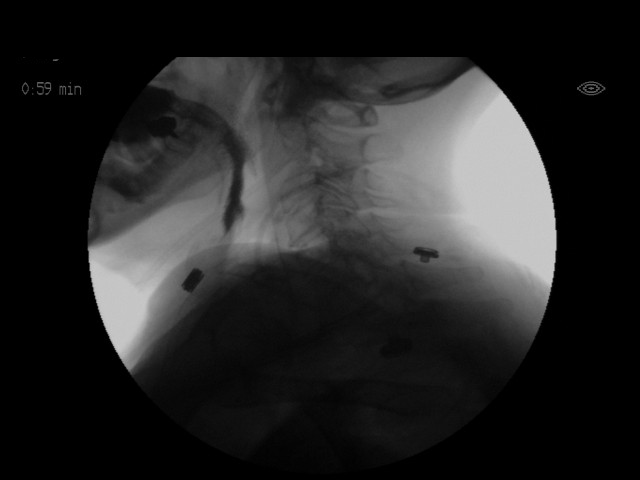
[im 19/25]
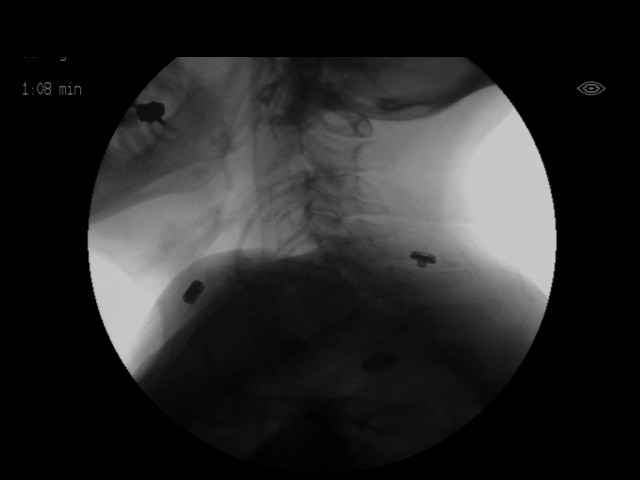
[im 20/25]
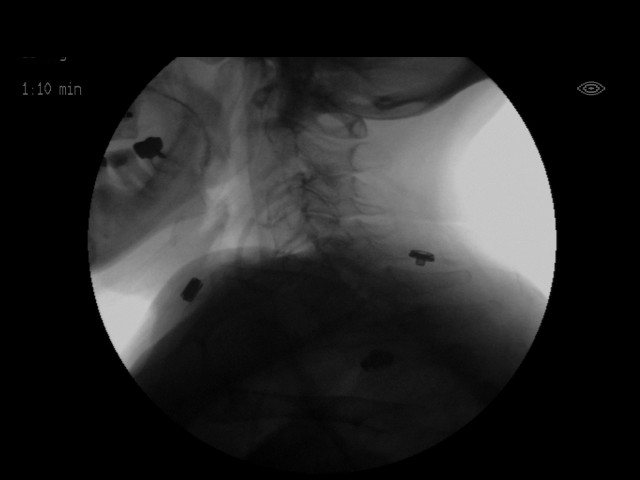
[im 21/25]
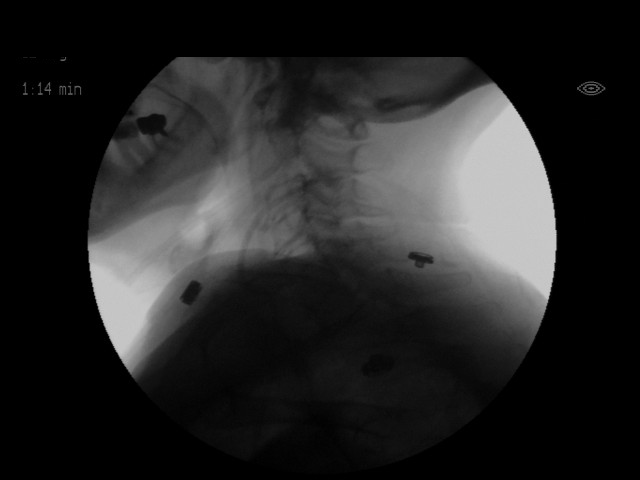
[im 23/25]
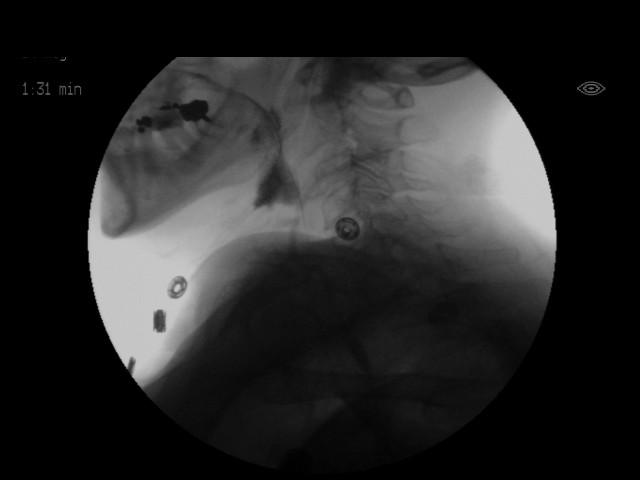
[im 25/25]
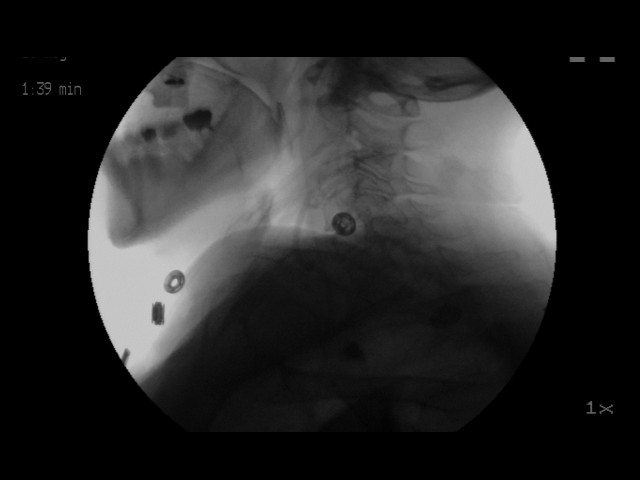

[18 of 24 positions shown; findings below may reference images not displayed]

FLUOROSCOPY FOR SWALLOWING FUNCTION STUDY:
Fluoroscopy was provided for swallowing function study, which was administered by a speech pathologist.  Final results and recommendations from this study are contained within the speech pathology report.

## 2018-07-21 IMAGING — CR DG CHEST 1V PORT
1 series · 1 of 1 positions shown · non-contrast
Comparison: Chest radiograph performed 04/11/2016

CLINICAL DATA: Acute onset of shortness of breath and cough.
Initial encounter.

EXAM:
PORTABLE CHEST 1 VIEW

[AP]
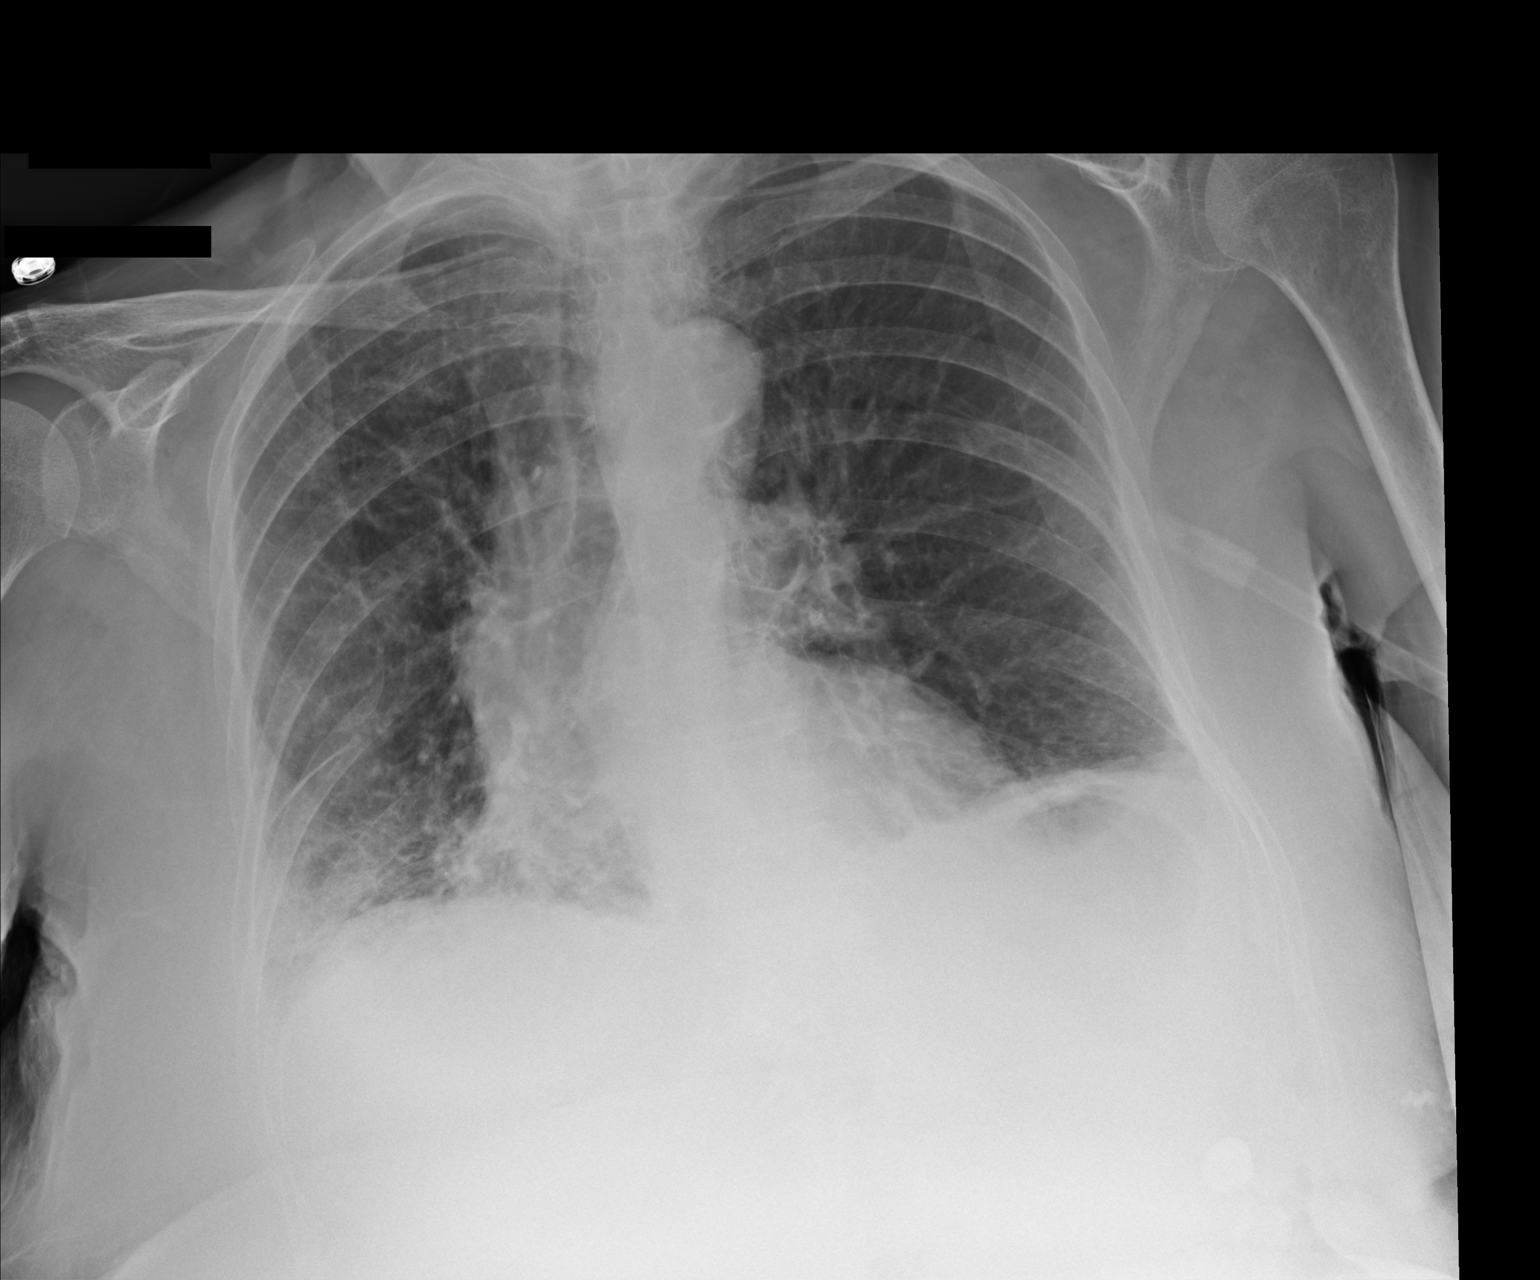

[1 of 1 positions shown; findings below may reference images not displayed]

FINDINGS: Right basilar airspace opacity raises concern for pneumonia. No
pleural effusion or pneumothorax is seen.

The cardiomediastinal silhouette is borderline normal in size. No
acute osseous abnormalities are identified.
IMPRESSION: Right basilar pneumonia noted.

## 2018-07-23 IMAGING — CR DG CHEST 1V PORT
1 series · 1 of 1 positions shown · non-contrast
Comparison: Chest radiograph performed 04/14/2016

CLINICAL DATA: Acute onset of shortness of breath. Initial
encounter.

EXAM:
PORTABLE CHEST 1 VIEW

[AP]
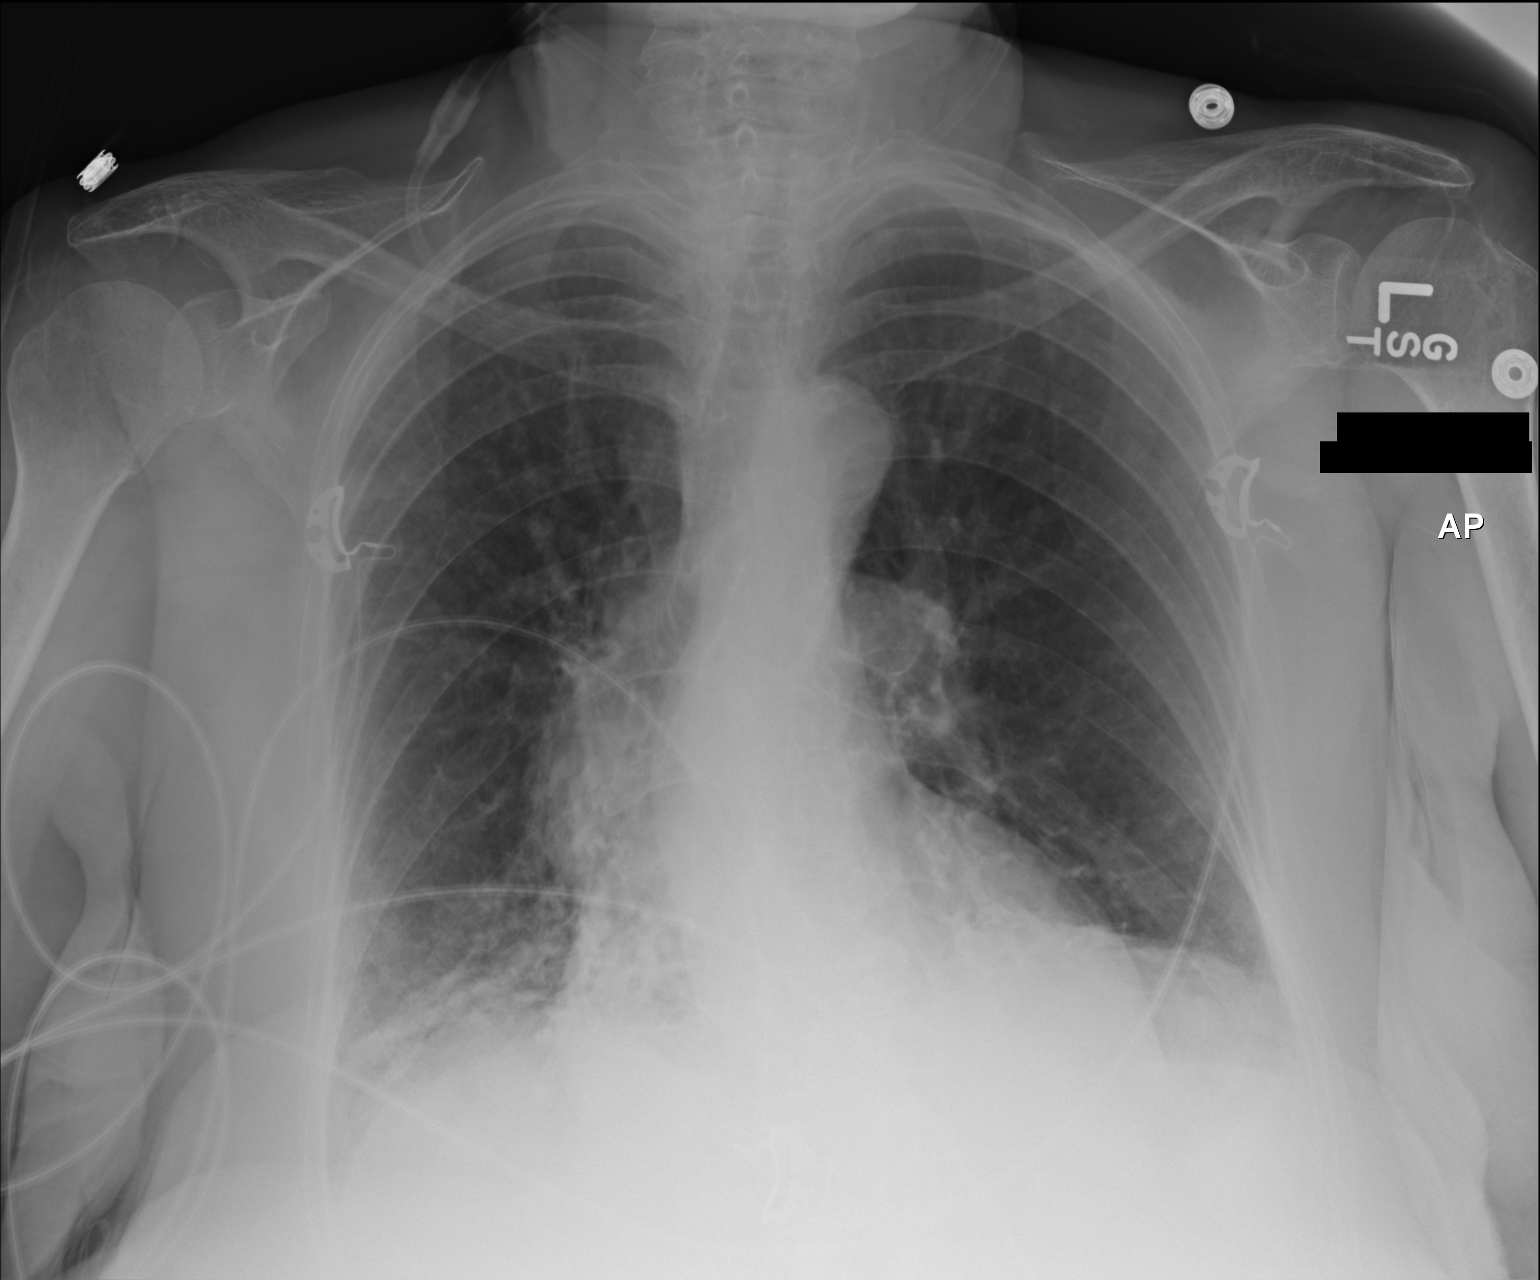

[1 of 1 positions shown; findings below may reference images not displayed]

FINDINGS: Persistent right basilar airspace opacity remains concerning for
pneumonia. No definite pleural effusion or pneumothorax is seen.
Mild vascular congestion is noted.

The cardiomediastinal silhouette is mildly enlarged. No acute
osseous abnormalities are identified.
IMPRESSION: 1. Persistent right basilar airspace opacity remains concerning for
pneumonia.
2. Mild vascular congestion and mild cardiomegaly.
